# Patient Record
Sex: Male | Born: 1965 | Race: White | Hispanic: Yes | Marital: Married | Smoking: Former smoker
Health system: Southern US, Community
[De-identification: ages and names within clinical notes are randomized; demographics above are authoritative.]

## PROBLEM LIST (undated history)

## (undated) DIAGNOSIS — K219 Gastro-esophageal reflux disease without esophagitis: Secondary | ICD-10-CM

## (undated) DIAGNOSIS — R011 Cardiac murmur, unspecified: Secondary | ICD-10-CM

## (undated) DIAGNOSIS — I10 Essential (primary) hypertension: Secondary | ICD-10-CM

## (undated) DIAGNOSIS — R519 Headache, unspecified: Secondary | ICD-10-CM

## (undated) DIAGNOSIS — I719 Aortic aneurysm of unspecified site, without rupture: Secondary | ICD-10-CM

## (undated) DIAGNOSIS — I739 Peripheral vascular disease, unspecified: Secondary | ICD-10-CM

## (undated) DIAGNOSIS — R42 Dizziness and giddiness: Secondary | ICD-10-CM

## (undated) DIAGNOSIS — M1990 Unspecified osteoarthritis, unspecified site: Secondary | ICD-10-CM

## (undated) DIAGNOSIS — E785 Hyperlipidemia, unspecified: Secondary | ICD-10-CM

## (undated) HISTORY — DX: Dizziness and giddiness: R42

## (undated) HISTORY — PX: APPENDECTOMY: SHX54

## (undated) HISTORY — PX: COLONOSCOPY WITH ESOPHAGOGASTRODUODENOSCOPY (EGD): SHX5779

## (undated) HISTORY — PX: EYE SURGERY: SHX253

---

## 2005-07-11 ENCOUNTER — Encounter: Admission: RE | Admit: 2005-07-11 | Discharge: 2005-07-11 | Payer: Self-pay | Admitting: Specialist

## 2011-09-16 ENCOUNTER — Ambulatory Visit (HOSPITAL_COMMUNITY): Payer: Worker's Compensation

## 2011-09-16 ENCOUNTER — Encounter (HOSPITAL_BASED_OUTPATIENT_CLINIC_OR_DEPARTMENT_OTHER): Payer: Self-pay | Admitting: *Deleted

## 2011-09-16 ENCOUNTER — Encounter (HOSPITAL_BASED_OUTPATIENT_CLINIC_OR_DEPARTMENT_OTHER)
Admission: RE | Disposition: A | Discharge status: Home / Self Care | Payer: Self-pay | Source: Ambulatory Visit | Attending: Orthopedic Surgery

## 2011-09-16 ENCOUNTER — Ambulatory Visit (HOSPITAL_BASED_OUTPATIENT_CLINIC_OR_DEPARTMENT_OTHER)
Admission: RE | Admit: 2011-09-16 | Discharge: 2011-09-16 | Disposition: A | Discharge status: Home / Self Care | Payer: Worker's Compensation | Source: Ambulatory Visit | Attending: Orthopedic Surgery | Admitting: Orthopedic Surgery

## 2011-09-16 DIAGNOSIS — M48061 Spinal stenosis, lumbar region without neurogenic claudication: Secondary | ICD-10-CM

## 2011-09-16 DIAGNOSIS — M5126 Other intervertebral disc displacement, lumbar region: Secondary | ICD-10-CM | POA: Insufficient documentation

## 2011-09-16 HISTORY — PX: STERIOD INJECTION: SHX5046

## 2011-09-16 SURGERY — MINOR STEROID INJECTION
Anesthesia: LOCAL | Site: Back | Laterality: Left | Wound class: Clean

## 2011-09-16 MED ORDER — IOHEXOL 300 MG/ML  SOLN
INTRAMUSCULAR | Status: DC | PRN
  Administered 2011-09-16: 1 mL via EPIDURAL

## 2011-09-16 MED ORDER — BUPIVACAINE HCL (PF) 0.25 % IJ SOLN
INTRAMUSCULAR | Status: DC | PRN
  Administered 2011-09-16: 2 mL

## 2011-09-16 MED ORDER — LIDOCAINE HCL (PF) 1 % IJ SOLN
INTRAMUSCULAR | Status: DC | PRN
  Administered 2011-09-16: 2 mL

## 2011-09-16 MED ORDER — TRAMADOL HCL 50 MG PO TABS: 50.0000 mg | ORAL_TABLET | Freq: Four times a day (QID) | ORAL | Status: DC | PRN

## 2011-09-16 MED ORDER — METHYLPREDNISOLONE ACETATE 40 MG/ML IJ SUSP
INTRAMUSCULAR | Status: DC | PRN
  Administered 2011-09-16: 40 mg

## 2011-09-16 SURGICAL SUPPLY — 15 items
BANDAGE ADHESIVE 1X3 (GAUZE/BANDAGES/DRESSINGS) ×2 IMPLANT
CHLORAPREP W/TINT 26ML (MISCELLANEOUS) ×2 IMPLANT
GLOVE BIO SURGEON STRL SZ 6.5 (GLOVE) ×2 IMPLANT
GLOVE SS BIOGEL STRL SZ 8.5 (GLOVE) ×1 IMPLANT
GLOVE SUPERSENSE BIOGEL SZ 8.5 (GLOVE) ×1
NDL SAFETY ECLIPSE 18X1.5 (NEEDLE) IMPLANT
NDL SPNL 22GX7 QUINCKE BK (NEEDLE) IMPLANT
NEEDLE HYPO 18GX1.5 SHARP (NEEDLE)
NEEDLE SPNL 22GX3.5 QUINCKE BK (NEEDLE) ×2 IMPLANT
NEEDLE SPNL 22GX5 LNG QUINC BK (NEEDLE) IMPLANT
NEEDLE SPNL 22GX7 QUINCKE BK (NEEDLE) IMPLANT
SYR 3ML 23GX1 SAFETY (SYRINGE) ×2 IMPLANT
SYR 5ML LL (SYRINGE) ×2 IMPLANT
SYR CONTROL 10ML LL (SYRINGE) IMPLANT
TOWEL OR 17X24 6PK STRL BLUE (TOWEL DISPOSABLE) ×2 IMPLANT

## 2011-09-16 NOTE — H&P (Signed)
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  A recent MRI shows the disc herniation to have resorbed, but he has foraminal stenosis.  We have recommended a TFESI at left L5-S1 which is being done at [facility]

## 2011-09-16 NOTE — Op Note (Signed)
Preprocedure diagnosis: Left sciatic pain secondary to L5-S1 foraminal stenosis left side, status post resorption of L5-S1 disc protrusion  Post procedure diagnosis: The same  Procedure: Left L5-S1 transforaminal epidural steroid injection, fluoroscopic guidance  The patient was positioned prone on a radiolucent table. The lumbar area it was prepped with chlorhexidine/isopropyl alcohol.  Under biplane for scopic guidance the tip 22-gauge spinal needle was positioned at the exit zone of the left L5-S1 neural foramen. Omnipaque was injected and flowed epidurally and along the nerve. 2 cc of a previously mixed injectate consisting of quarter percent plain preservative-free Marcaine, 1% plain preservative-free lidocaine and Depo-Medrol was injected. The needle was withdrawn. There was no back bleeding. The patient was transferred the preop area.  Sign Johnathan Anderson

## 2011-09-19 ENCOUNTER — Encounter (HOSPITAL_BASED_OUTPATIENT_CLINIC_OR_DEPARTMENT_OTHER): Payer: Self-pay | Admitting: Orthopedic Surgery

## 2013-11-01 ENCOUNTER — Other Ambulatory Visit: Payer: Self-pay | Admitting: Internal Medicine

## 2013-11-01 DIAGNOSIS — R413 Other amnesia: Secondary | ICD-10-CM

## 2013-11-08 ENCOUNTER — Other Ambulatory Visit: Payer: Self-pay

## 2014-01-21 ENCOUNTER — Ambulatory Visit
Admission: RE | Admit: 2014-01-21 | Discharge: 2014-01-21 | Disposition: A | Discharge status: Home / Self Care | Payer: BC Managed Care – PPO | Source: Ambulatory Visit | Attending: Internal Medicine | Admitting: Internal Medicine

## 2014-01-21 DIAGNOSIS — R413 Other amnesia: Secondary | ICD-10-CM

## 2014-01-21 MED ORDER — GADOBENATE DIMEGLUMINE 529 MG/ML IV SOLN
17.0000 mL | Freq: Once | INTRAVENOUS | Status: AC | PRN
  Administered 2014-01-21: 17 mL via INTRAVENOUS

## 2014-02-24 ENCOUNTER — Encounter: Payer: Self-pay | Admitting: Neurology

## 2014-02-24 ENCOUNTER — Ambulatory Visit (INDEPENDENT_AMBULATORY_CARE_PROVIDER_SITE_OTHER): Payer: BC Managed Care – PPO | Admitting: Neurology

## 2014-02-24 VITALS — BP 124/82 | HR 51 | Resp 16 | Ht 67.0 in | Wt 190.0 lb

## 2014-02-24 DIAGNOSIS — R0609 Other forms of dyspnea: Secondary | ICD-10-CM

## 2014-02-24 DIAGNOSIS — R0989 Other specified symptoms and signs involving the circulatory and respiratory systems: Secondary | ICD-10-CM

## 2014-02-24 DIAGNOSIS — R0683 Snoring: Secondary | ICD-10-CM

## 2014-02-24 DIAGNOSIS — G8929 Other chronic pain: Secondary | ICD-10-CM | POA: Insufficient documentation

## 2014-02-24 DIAGNOSIS — M549 Dorsalgia, unspecified: Secondary | ICD-10-CM

## 2014-02-24 DIAGNOSIS — R413 Other amnesia: Secondary | ICD-10-CM | POA: Insufficient documentation

## 2014-02-24 NOTE — Progress Notes (Signed)
NEUROLOGY CONSULTATION NOTE  Johnathan Anderson MRN: [id number] DOB: 1965-09-18  Referring provider: Dr. Lavone Orn Primary care provider: Dr. Lavone Orn  Reason for consult:  Memory loss  Dear Dr Laurann Montana:  Thank you for your kind referral of [facility] for consultation of the above symptoms. Although his history is well known to you, please allow me to reiterate it for the purpose of our medical record. The patient was accompanied to the clinic by his wife who also provides collateral information. Records and images were personally reviewed where available.  HISTORY OF PRESENT ILLNESS: This is a very pleasant 48 year old right-handed man a history of chronic back pain, presenting for worsening memory loss over the past 3 years. The patient himself has noticed that he has become very forgetful even when his wife has listed things down. He has been unemployed and on disability due to the back pain. He forgets to do chores, misplaces things at home.  His wife reports he puts things for the fridge in the pantry where they don't belong. He would forget his wallet and walk back to the house, then forget what he was going to get.  His wife reports that she would tell him where they are going when getting into the car, then he would drive somewhere else, forgetting where they were supposed to go. A few months ago he went for a doctor's appointment, but went to the wrong physician and demanded to be seen, thinking he was supposed to be there in the first place.  His wife needs to constantly remind him about things. He and his family have noticed word-finding difficulties, they have been finishing his sentences for him.  He does endorse struggling with finances due to his disability.  His wife also recently switched jobs a couple of weeks ago. He has been unemployed for the past 6 years, taking Tramadol 1-2 times a day for the back pain.  He briefly took Lexapro but he feels he is not depressed,  stating it is the situation he is in, and that the medication made him too sleepy.  There is no family history of memory problems. No history of head injuries, infections. He used to drink alcohol socially in the past but currently denies any alcohol intake. He reports around 6 hours of interrupted sleep, a lot in part due to his back pain, however his wife reports snoring with occasional possible apnea.  He feels drowsy in the afternoon but does not take naps. He denies any headaches, diplopia, dysarthria, dysphagia, neck pain, bowel/bladder dysfunction. He has occasional positional vertigo lasting 5 minutes with no associated nausea, vomiting. He denies any focal numbness/tingling. He has chronic back pain radiating behind his left leg. He and his wife deny any staring/unresponsive episodes, gaps in time, epigastric rising sensation, olfactory/gustatory hallucinations, myoclonic jerks.  I personally reviewed MRI brain with and without contrast done 01/21/2014 which was normal.  Laboratory Data: CBC and CMP normal. Normal vitamin B12 378, TSH 3.08, nonreactive RPR.  PAST MEDICAL HISTORY: No past medical history on file.  PAST SURGICAL HISTORY: Past Surgical History  Procedure Laterality Date  . Steriod injection  09/16/2011    Procedure: MINOR STEROID INJECTION;  Surgeon: Lowella Grip, MD;  Location: [place];  Service: Orthopedics;  Laterality: Left;  Left Lumbar Five-Sacral One Transforaminal Epidural Steroid Injection  . Eye surgery    . Appendectomy      MEDICATIONS: No current outpatient prescriptions on file prior to  visit.   No current facility-administered medications on file prior to visit.    ALLERGIES: No Known Allergies  FAMILY HISTORY: Family History  Problem Relation Age of Onset  . Diabetes Father   . Hypertension Mother     SOCIAL HISTORY: History   Social History  . Marital Status: Married    Spouse Name: N/A    Number of Children:  N/A  . Years of Education: N/A   Occupational History  . Not on file.   Social History Main Topics  . Smoking status: Former Research scientist (life sciences)  . Smokeless tobacco: Not on file  . Alcohol Use: No  . Drug Use: No  . Sexual Activity: Not on file   Other Topics Concern  . Not on file   Social History Narrative  . No narrative on file    REVIEW OF SYSTEMS: Constitutional: No fevers, chills, or sweats, no generalized fatigue, change in appetite Eyes: No visual changes, double vision, eye pain Ear, nose and throat: No hearing loss, ear pain, nasal congestion, sore throat Cardiovascular: No chest pain, palpitations Respiratory:  No shortness of breath at rest or with exertion, wheezes GastrointestinaI: No nausea, vomiting, diarrhea, abdominal pain, fecal incontinence Genitourinary:  No dysuria, urinary retention or frequency Musculoskeletal:  No neck pain, +back pain Integumentary: No rash, pruritus, skin lesions Neurological: as above Psychiatric: No depression, insomnia, anxiety Endocrine: No palpitations, fatigue, diaphoresis, mood swings, change in appetite, change in weight, increased thirst Hematologic/Lymphatic:  No anemia, purpura, petechiae. Allergic/Immunologic: no itchy/runny eyes, nasal congestion, recent allergic reactions, rashes  PHYSICAL EXAM: Filed Vitals:   02/24/14 0911  BP: 124/82  Pulse: 51  Resp: 16   General: No acute distress Head:  Normocephalic/atraumatic Eyes: Fundoscopic exam shows bilateral sharp discs, no vessel changes, exudates, or hemorrhages Neck: supple, no paraspinal tenderness, full range of motion Back: No paraspinal tenderness Heart: regular rate and rhythm Lungs: Clear to auscultation bilaterally. Vascular: No carotid bruits. Skin/Extremities: No rash, no edema Neurological Exam: Mental status: alert and oriented to person, place, and time, no dysarthria or aphasia, Fund of knowledge is appropriate.  Remote memory are intact.  Decreased  attention and concentration.    Able to name objects and repeat phrases.  Montreal Cognitive Assessment  02/24/2014  Visuospatial/ Executive (0/5) 5  Naming (0/3) 3  Attention: Read list of digits (0/2) 2  Attention: Read list of letters (0/1) 0  Attention: Serial 7 subtraction starting at 100 (0/3) 3  Language: Repeat phrase (0/2) 2  Language : Fluency (0/1) 0  Abstraction (0/2) 2  Delayed Recall (0/5) 2  Orientation (0/6) 6  Total 25  Adjusted Score (based on education) 26   Cranial nerves: CN I: not tested CN II: pupils equal, round and reactive to light, visual fields intact, fundi unremarkable. CN III, IV, VI:  full range of motion, no nystagmus, no ptosis CN V: facial sensation intact CN VII: upper and lower face symmetric CN VIII: hearing intact to finger rub CN IX, X: gag intact, uvula midline CN XI: sternocleidomastoid and trapezius muscles intact CN XII: tongue midline Bulk & Tone: normal, no fasciculations. Motor: 5/5 throughout with no pronator drift. Sensation: intact to light touch, cold, pin, vibration and joint position sense.  No extinction to double simultaneous stimulation.  Romberg test negative Deep Tendon Reflexes: +2 throughout, no ankle clonus Plantar responses: downgoing bilaterally Cerebellar: no incoordination on finger to nose, heel to shin. No dysdiadochokinesia Gait: narrow-based and steady, able to tandem walk adequately. Tremor: none  IMPRESSION: This is a pleasant 48 year old right-handed man with a history of chronic back pain presenting for worsening memory loss over the past 3 years. Neurological exam and brain MRI normal.  His MOCA score today is 26/30. Although this is still considered normal, by history symptoms are suggestive of mild cognitive impairment. There is some decreased attention and concentration noted during the [place], we discussed different causes of memory loss. Metabolic workup normal. Sleep apnea may contribute to cognitive  issues as well, he will be scheduled for a sleep study to assess for this.  We discussed pseudodementia and effects of mood on memory, he would benefit from formal neuropsychological evaluation to further evaluate the cause of his cognitive changes.  No indication for starting cholinesterase inhibitors at this time. We discussed the importance of physical exercise and brain stimulation exercises for brain health.  He will follow-up after the tests.    Thank you for allowing me to participate in the care of this patient. Please do not hesitate to call for any questions or concerns.   Ellouise Newer, M.D.  CC: Dr. Laurann Montana

## 2014-02-24 NOTE — Patient Instructions (Signed)
1. Schedule Neuropsychological evaluation 2. Schedule sleep study 3. Physical exercise and brain stimulation exercises are important for brain health 4. Follow-up after the tests

## 2014-04-27 ENCOUNTER — Encounter (HOSPITAL_BASED_OUTPATIENT_CLINIC_OR_DEPARTMENT_OTHER): Payer: BC Managed Care – PPO

## 2014-05-09 ENCOUNTER — Ambulatory Visit: Payer: BC Managed Care – PPO | Admitting: Neurology

## 2014-05-12 ENCOUNTER — Telehealth: Payer: Self-pay | Admitting: Neurology

## 2014-05-12 ENCOUNTER — Encounter: Payer: Self-pay | Admitting: *Deleted

## 2014-05-12 NOTE — Progress Notes (Unsigned)
No show letter sent for 05/09/2014

## 2014-05-12 NOTE — Telephone Encounter (Signed)
Pt no showed 05/12/14 appt w/ Dr. Delice Lesch. Several attempts were made to contact pt b/c we had to r/s appt. Pt never called our office back.  Danae Chen - please send no show letter / Gayleen Orem.

## 2019-03-16 ENCOUNTER — Other Ambulatory Visit: Payer: Self-pay

## 2019-03-16 ENCOUNTER — Ambulatory Visit (HOSPITAL_COMMUNITY)
Admission: EM | Admit: 2019-03-16 | Discharge: 2019-03-16 | Disposition: A | Discharge status: Home / Self Care | Payer: BC Managed Care – PPO | Attending: Family Medicine | Admitting: Family Medicine

## 2019-03-16 ENCOUNTER — Encounter (HOSPITAL_COMMUNITY): Payer: Self-pay

## 2019-03-16 DIAGNOSIS — T7840XA Allergy, unspecified, initial encounter: Secondary | ICD-10-CM

## 2019-03-16 MED ORDER — METHYLPREDNISOLONE SODIUM SUCC 125 MG IJ SOLR
80.0000 mg | Freq: Once | INTRAMUSCULAR | Status: AC
  Administered 2019-03-16: 13:00:00 80 mg via INTRAMUSCULAR

## 2019-03-16 MED ORDER — METHYLPREDNISOLONE SODIUM SUCC 125 MG IJ SOLR
INTRAMUSCULAR | Status: AC
  Filled 2019-03-16: qty 2

## 2019-03-16 NOTE — ED Triage Notes (Signed)
Pt report he think he was bit by a spider 2 hrs ago in her left arm, after that he feel huis ears are clogged and hot, and pressure in his head.

## 2019-03-16 NOTE — Discharge Instructions (Signed)
I believe this is an allergic reaction to possible insect bite or some substance on the wood.  We are giving you a steroid injection here today and would like for you to take benadryl every 6  to 8 hours If this worsens please let us know.  If you start having a trouble swallowing or breathing you will need to go straight to the ER.

## 2019-03-17 NOTE — ED Provider Notes (Signed)
[place]    CSN: [id number] Arrival date & time: 03/16/19  1228      History   Chief Complaint Chief Complaint  Patient presents with  . Insect Bite  . Ear Problem    HPI Johnathan Anderson is a 53 y.o. male.   Patient is a 53 year old male that presents today with allergic reaction possible insect bite to left anterior forearm.  He also has mild rash to [facility] area.  This started today after he was picking up some old wet plywood from outside.  Unsure if he was stung or bit by something.  Red raised area to forearm which is itchy.  Also feeling flushed in face and ears.  Symptoms have been constant.  He has not taken thing for his symptoms.  No trouble swallowing, breathing or shortness of breath. No allergies anything that he knows of.  ROS per HPI      History reviewed. No pertinent past medical history.  Patient Active Problem List   Diagnosis Date Noted  . Memory loss 02/24/2014  . Chronic back pain 02/24/2014    Past Surgical History:  Procedure Laterality Date  . APPENDECTOMY    . EYE SURGERY    . STERIOD INJECTION  09/16/2011   Procedure: MINOR STEROID INJECTION;  Surgeon: Lowella Grip, MD;  Location: [place];  Service: Orthopedics;  Laterality: Left;  Left Lumbar Five-Sacral One Transforaminal Epidural Steroid Injection       Home Medications    Prior to Admission medications   Medication Sig Start Date End Date Taking? Authorizing Provider  traMADol (ULTRAM) 50 MG tablet Take by mouth. Every 8 hours prn    [provider]    Family History Family History  Problem Relation Age of Onset  . Diabetes Father   . Hypertension Mother     Social History Social History   Tobacco Use  . Smoking status: Former Research scientist (life sciences)  . Smokeless tobacco: Never Used  Substance Use Topics  . Alcohol use: No  . Drug use: No     Allergies   Patient has no known allergies.   Review of Systems Review of Systems    Physical Exam Triage Vital Signs ED Triage Vitals  Enc Vitals Group     BP 03/16/19 1244 130/64     Pulse Rate 03/16/19 1244 60     Resp 03/16/19 1244 16     Temp 03/16/19 1244 98.8 F (37.1 C)     Temp Source 03/16/19 1244 Oral     SpO2 03/16/19 1244 96 %     Weight --      Height --      Head Circumference --      Peak Flow --      Pain Score 03/16/19 1242 0     Pain Loc --      Pain Edu? --      Excl. in [place]? --    No data found.  Updated Vital Signs BP 130/64 (BP Location: Right Arm)   Pulse 60   Temp 98.8 F (37.1 C) (Oral)   Resp 16   SpO2 96%   Visual Acuity Right Eye Distance:   Left Eye Distance:   Bilateral Distance:    Right Eye Near:   Left Eye Near:    Bilateral Near:     Physical Exam Vitals signs and nursing note reviewed.  Constitutional:      General: He is not in acute distress.  Appearance: Normal appearance. He is not ill-appearing, toxic-appearing or diaphoretic.  HENT:     Head: Normocephalic and atraumatic.     Comments: Somewhat flushed to face    Right Ear: Tympanic membrane and ear canal normal.     Left Ear: Tympanic membrane and ear canal normal.     Ears:     Comments: Both auricles erythematous but nonpainful to touch.    Nose: Nose normal.     Mouth/Throat:     Pharynx: Oropharynx is clear.  Eyes:     Conjunctiva/sclera: Conjunctivae normal.  Neck:     Musculoskeletal: Normal range of motion.  Cardiovascular:     Rate and Rhythm: Normal rate and regular rhythm.     Pulses: Normal pulses.     Heart sounds: Normal heart sounds.  Pulmonary:     Effort: Pulmonary effort is normal.     Breath sounds: Normal breath sounds.  Musculoskeletal: Normal range of motion.  Skin:    General: Skin is warm and dry.     Findings: Erythema and rash present. Rash is urticarial.          Comments: Urticarial rash to left before meals and red, raised area to left posterior forearm   Neurological:     Mental Status: He is alert.   Psychiatric:        Mood and Affect: Mood normal.      UC Treatments / Results  Labs (all labs ordered are listed, but only abnormal results are displayed) Labs Reviewed - No data to display  EKG   Radiology No results found.  Procedures Procedures (including critical care time)  Medications Ordered in UC Medications  methylPREDNISolone sodium succinate (SOLU-MEDROL) 125 mg/2 mL injection 80 mg (80 mg Intramuscular Given 03/16/19 1319)  methylPREDNISolone sodium succinate (SOLU-MEDROL) 125 mg/2 mL injection (has no administration in time range)    Initial Impression / Assessment and Plan / UC Course  I have reviewed the triage vital signs and the nursing notes.  Pertinent labs & imaging results that were available during my care of the patient were reviewed by me and considered in my medical decision making (see chart for details).     Allergic reaction-most likely allergic reaction to something on the wood or some sort of insect bite. No red flags. Treating with steroid injection here in clinic and will have him do Benadryl every 6-8 hours Strict return precautions given Follow up as needed for continued or worsening symptoms  Final Clinical Impressions(s) / UC Diagnoses   Final diagnoses:  Allergic reaction, initial encounter     Discharge Instructions     I believe this is an allergic reaction to possible insect bite or some substance on the wood.  We are giving you a steroid injection here today and would like for you to take benadryl every 6  to 8 hours If this worsens please let us know.  If you start having a trouble swallowing or breathing you will need to go straight to the ER.    ED Prescriptions    None     PDMP not reviewed this encounter.   Orvan July, NP 03/17/19 1035

## 2020-06-06 DIAGNOSIS — C801 Malignant (primary) neoplasm, unspecified: Secondary | ICD-10-CM

## 2020-06-06 HISTORY — PX: MOUTH SURGERY: SHX715

## 2020-06-06 HISTORY — DX: Malignant (primary) neoplasm, unspecified: C80.1

## 2020-07-14 ENCOUNTER — Ambulatory Visit
Admission: RE | Admit: 2020-07-14 | Discharge: 2020-07-14 | Disposition: A | Discharge status: Home / Self Care | Payer: 59 | Source: Ambulatory Visit | Attending: Internal Medicine | Admitting: Internal Medicine

## 2020-07-14 ENCOUNTER — Other Ambulatory Visit: Payer: Self-pay | Admitting: Internal Medicine

## 2020-07-14 DIAGNOSIS — M25562 Pain in left knee: Secondary | ICD-10-CM

## 2020-07-22 ENCOUNTER — Other Ambulatory Visit (HOSPITAL_COMMUNITY): Payer: Self-pay | Admitting: Internal Medicine

## 2020-07-22 DIAGNOSIS — Q231 Congenital insufficiency of aortic valve: Secondary | ICD-10-CM

## 2020-08-14 ENCOUNTER — Other Ambulatory Visit: Payer: Self-pay

## 2020-08-14 ENCOUNTER — Ambulatory Visit (HOSPITAL_BASED_OUTPATIENT_CLINIC_OR_DEPARTMENT_OTHER)
Admission: RE | Admit: 2020-08-14 | Discharge: 2020-08-14 | Disposition: A | Discharge status: Home / Self Care | Payer: 59 | Source: Ambulatory Visit | Attending: Internal Medicine | Admitting: Internal Medicine

## 2020-08-14 DIAGNOSIS — Q231 Congenital insufficiency of aortic valve: Secondary | ICD-10-CM | POA: Insufficient documentation

## 2020-08-15 LAB — ECHOCARDIOGRAM COMPLETE
AR max vel: 0.72 cm2
AV Area VTI: 0.79 cm2
AV Area mean vel: 0.65 cm2
AV Mean grad: 29.3 mmHg
AV Peak grad: 46.9 mmHg
Ao pk vel: 3.42 m/s
Area-P 1/2: 4.06 cm2
S' Lateral: 2.72 cm

## 2020-08-27 ENCOUNTER — Other Ambulatory Visit: Payer: Self-pay | Admitting: Internal Medicine

## 2020-08-27 ENCOUNTER — Ambulatory Visit
Admission: RE | Admit: 2020-08-27 | Discharge: 2020-08-27 | Disposition: A | Discharge status: Home / Self Care | Payer: 59 | Source: Ambulatory Visit | Attending: Internal Medicine | Admitting: Internal Medicine

## 2020-08-27 DIAGNOSIS — M25511 Pain in right shoulder: Secondary | ICD-10-CM

## 2020-10-04 NOTE — Progress Notes (Signed)
Cardiology Office Note:    Date:  10/05/2020   ID:  Johnathan Anderson, DOB 15-Nov-1965, MRN [id number]  PCP:  Lavone Orn, [place]  Cardiologist:  Werner Lean, MD  Advanced Practice Provider:  No care team member to display Electrophysiologist:  None       CC: Bicuspid Aortic Valve  Consulted for the evaluation of Bicuspid Aortic Valve/Mild to moderate AA dilation, and rule out of vegetation at the behest of Lavone Orn, MD   History of Present Illness:    Johnathan Anderson is a 55 y.o. male with a hx of recent echocardiogram 08/14/20 who presents for evaluation 10/04/20.  Patient notes that he is feeling fine.  Has had no chest pain, chest pressure, chest tightness, chest stinging.   Does rarely feel chest pinch- spontaneously and randomly, that last 0.5 s.  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  No shortness of breath, DOE.  No PND or orthopnea.  No bendopnea, weight gain, leg swelling , or abdominal swelling.  No syncope or near syncope. Notes  no palpitations or funny heart beats.   No fevers, chills, night sweats.  No recent hospitalizations.  Notes three molars that was taking out 6 months ago and has peridontal issues.   No past medical history on file.  Past Surgical History:  Procedure Laterality Date  . APPENDECTOMY    . EYE SURGERY    . STERIOD INJECTION  09/16/2011   Procedure: MINOR STEROID INJECTION;  Surgeon: Lowella Grip, MD;  Location: [place];  Service: Orthopedics;  Laterality: Left;  Left Lumbar Five-Sacral One Transforaminal Epidural Steroid Injection    Current Medications: Current Meds  Medication Sig  . diclofenac Sodium (VOLTAREN) 1 % GEL daily.  . [DISCONTINUED] traMADol (ULTRAM) 50 MG tablet Take by mouth. Every 8 hours prn     Allergies:   Levaquin [levofloxacin]   Social History   Socioeconomic History  . Marital  status: Married    Spouse name: Not on file  . Number of children: Not on file  . Years of education: Not on file  . Highest education level: Not on file  Occupational History  . Not on file  Tobacco Use  . Smoking status: Former Research scientist (life sciences)  . Smokeless tobacco: Never Used  Substance and Sexual Activity  . Alcohol use: No  . Drug use: No  . Sexual activity: Not on file  Other Topics Concern  . Not on file  Social History Narrative  . Not on file   Social Determinants of Health   Financial Resource Strain: Not on file  Food Insecurity: Not on file  Transportation Needs: Not on file  Physical Activity: Not on file  Stress: Not on file  Social Connections: Not on file    Social: Comes with his wife; has many brothers and four sons; originally from [place]  Family History: The patient's family history includes Diabetes in his father; Hypertension in his mother. History of coronary artery disease notable possibly brother. History of heart failure notable for no members. History of arrhythmia notable for no members Family history of sudden cardiac death: Brother was totally fine, had an argument with his daughter, and died suddenly. No history of bicuspid aortic valve or aortic aneurysm. No history of cardiomyopathies including hypertrophic cardiomyopathy, left ventricular non-compaction, or arrhythmogenic right ventricular cardiomyopathy.   ROS:   Please see the history of present illness.  All other systems reviewed and are negative.  EKGs/Labs/Other Studies Reviewed:    The following studies were reviewed today:  EKG:  EKG is  ordered today.  The ekg ordered today demonstrates  10/05/20: Sinus bradycardia borderline LVH 08/19/20 OSH;  SR rate 60 borderline LVH  Transthoracic Echocardiogram: Date: 08/14/20 Results: AA 44 mm, Bicuspid calcific valve, cannot r/o calcification vs lesion on LVOT side, no evidence of Coarctation; LVSVi 33; DVI 0.28, Acceleration time < 100  ms 1. Left ventricular ejection fraction, by estimation, is 60 to 65%. The  left ventricle has normal function. The left ventricle has no regional  wall motion abnormalities. Left ventricular diastolic parameters were  normal.  2. Right ventricular systolic function is normal. The right ventricular  size is normal. There is normal pulmonary artery systolic pressure.  3. The mitral valve is normal in structure. No evidence of mitral valve  regurgitation. No evidence of mitral stenosis.  4. Mass noted attached to rt coronary cusp of the aortic valve measuring  ~7 mm. Differential diagnosis should include endocarditis, fibroelastome,  thrombus. TEE is recommended.. The aortic valve is bicuspid. There is mild  calcification of the aortic  valve. There is mild thickening of the aortic valve. Aortic valve  regurgitation is mild. Moderate aortic valve stenosis.  5. Aneurysm of the ascending aorta, measuring 44 mm.  6. The inferior vena cava is normal in size with greater than 50%  respiratory variability, suggesting right atrial pressure of 3 mmHg.    Recent Labs: No results found for requested labs within last 8760 hours.  Recent Lipid Panel No results found for: CHOL, TRIG, HDL, CHOLHDL, VLDL, LDLCALC, LDLDIRECT   Risk Assessment/Calculations:     N/A  Physical Exam:    VS:  BP 130/80   Pulse (!) 52   Ht 5\' 6"  (1.676 m)   Wt 204 lb (92.5 kg)   SpO2 98%   BMI 32.93 kg/m     Wt Readings from Last 3 Encounters:  10/05/20 204 lb (92.5 kg)  02/24/14 190 lb (86.2 kg)  09/16/11 180 lb (81.6 kg)    GEN:  Well nourished, well developed in no acute distress HEENT: Normal NECK: No JVD; No carotid bruits LYMPHATICS: No lymphadenopathy CARDIAC: RRR, III/VI systolic murmur with A2 still present and with no rubs, gallops RESPIRATORY:  Clear to auscultation without rales, wheezing or rhonchi  ABDOMEN: Soft, non-tender, non-distended MUSCULOSKELETAL:  No edema; No deformity   SKIN: Warm and dry NEUROLOGIC:  Alert and oriented x 3 PSYCHIATRIC:  Normal affect   ASSESSMENT:    1. Thoracic aortic aneurysm without rupture ([place])   2. Moderate aortic stenosis   3. Bicuspid aortic valve   4. Family history of aortic dissection   5. Periodontitis    PLAN:    In order of problems listed above:  Asymptomatic thoracic aortic aneurysm Moderate Aortic Stenosis Bicuspid Aortic Valve without Coarctation of the Aorta Possible Fhx Aortic Dissection (Brother) Peridontitis with dental work and possible aortic valve mass - Last at 44 mm, unclear rate of growth  - will get CT TAVR (morphology) can also get some information on aortic size with this - given peridontal disease will plan for TEE as well - 1st degree relative one time screening:  Given his brother we have a  A lower threshold to refer to surgery ( 4.5 cm with SAVR/5.0 cm without) - Discussed not using Fluoroquinolones - if no surgical indications will get echo (TTE) for monitoring in December  December 2022 follow up unless new symptoms or abnormal test results warranting change in plan  Would be reasonable for  Video Visit Follow up Would be reasonable for  APP Follow up    Shared Decision Making/Informed Consent The risks [esophageal damage, perforation (1:10,000 risk), bleeding, pharyngeal hematoma as well as other potential complications associated with conscious sedation including aspiration, arrhythmia, respiratory failure and death], benefits (treatment guidance and diagnostic support) and alternatives of a transesophageal echocardiogram were discussed in detail with Mr. Kochevar and he is willing to proceed.       Medication Adjustments/Labs and Tests Ordered: Current medicines are reviewed at length with the patient today.  Concerns regarding medicines are outlined above.  Orders Placed This Encounter  Procedures  . CT ANGIO CHEST AORTA W/CM & OR WO/CM  . CT CORONARY MORPH W/CTA COR W/SCORE  W/CA W/CM &/OR WO/CM  . CT Angio Abd/Pel w/ and/or w/o  . EKG 12-Lead  . ECHOCARDIOGRAM COMPLETE   No orders of the defined types were placed in this encounter.   Patient Instructions  Medication Instructions:  Your physician recommends that you continue on your current medications as directed. Please refer to the Current Medication list given to you today. *If you need a refill on your cardiac medications before your next appointment, please call your pharmacy*   Lab Work: NONE If you have labs (blood work) drawn today and your tests are completely normal, you will receive your results only by: Marland Kitchen MyChart Message (if you have MyChart) OR . A paper copy in the mail If you have any lab test that is abnormal or we need to change your treatment, we will call you to review the results.   Testing/Procedures: Your physician has requested that you have a CT scan.  Your physician has requested that you have a TEE. During a TEE, sound waves are used to create images of your heart. It provides your doctor with information about the size and shape of your heart and how well your heart's chambers and valves are working. In this test, a transducer is attached to the end of a flexible tube that's guided down your throat and into your esophagus (the tube leading from you mouth to your stomach) to get a more detailed image of your heart. You are not awake for the procedure. Please see the instruction sheet given to you today.   Your physician has requested that you have an echocardiogram in December. Echocardiography is a painless test that uses sound waves to create images of your heart. It provides your doctor with information about the size and shape of your heart and how well your heart's chambers and valves are working. This procedure takes approximately one hour. There are no restrictions for this procedure.     Follow-Up: At [facility], you and your health needs are our priority.  As part  of our continuing mission to provide you with exceptional heart care, we have created designated Provider Care Teams.  These Care Teams include your primary Cardiologist (physician) and Advanced Practice Providers (APPs -  Physician Assistants and Nurse Practitioners) who all work together to provide you with the care you need, when you need it.  We recommend signing up for the patient portal called "MyChart".  Sign up information is provided on this After Visit Summary.  MyChart is used to connect with patients for Virtual Visits (Telemedicine).  Patients are able to view lab/test results, encounter notes, upcoming appointments, etc.  Non-urgent messages can  be sent to your provider as well.   To learn more about what you can do with MyChart, go to [email or web address].    Your next appointment:   7 month(s)  The format for your next appointment:   In Person  Provider:   You may see Werner Lean, MD or one of the following Advanced Practice Providers on your designated Care Team:    Melina Copa, PA-C  Ermalinda Barrios, PA-C    Other Instructions   You are scheduled for a TEE on November 05, 2020 with Dr. Gasper Sells.  Please arrive at the [facility] (Main Entrance A) at [facility]: [address] [place], [place] [address] at 6:30 am.   DIET: Nothing to eat or drink after midnight except a sip of water with medications  Medication Instructions:   Labs: will be drawn day of TEE on November 05, 2020  You must have a responsible person to drive you home and stay in the waiting area during your procedure. Failure to do so could result in cancellation.  Bring your insurance cards.  *Special Note: Every effort is made to have your procedure done on time. Occasionally there are emergencies that occur at the hospital that may cause delays. Please be patient if a delay does occur.      INSTRUCTIONS FOR  CT SCANS  Your cardiac CT will be scheduled at one of the below  locations:   [facility] [address] [place], [place] [address] [phone]    Please arrive at the [facility] main entrance (entrance A) of [facility] 30 minutes prior to test start time. Proceed to the [facility] Radiology Department (first floor) to check-in and test prep.   Please follow these instructions carefully (unless otherwise directed):  Hold all erectile dysfunction medications at least 3 days (72 hrs) prior to test.  On the Night Before the Test: . Be sure to Drink plenty of water. . Do not consume any caffeinated/decaffeinated beverages or chocolate 12 hours prior to your test. . Do not take any antihistamines 12 hours prior to your test.  On the Day of the Test: . Drink plenty of water until 1 hour prior to the test. . Do not eat any food 4 hours prior to the test. . You may take your regular medications prior to the test.          After the Test: . Drink plenty of water. . After receiving IV contrast, you may experience a mild flushed feeling. This is normal. . On occasion, you may experience a mild rash up to 24 hours after the test. This is not dangerous. If this occurs, you can take Benadryl 25 mg and increase your fluid intake. . If you experience trouble breathing, this can be serious. If it is severe call 911 IMMEDIATELY. If it is mild, please call our office. . If you take any of these medications: Glipizide/Metformin, Avandament, Glucavance, please do not take 48 hours after completing test unless otherwise instructed.   Once we have confirmed authorization from your insurance company, we will call you to set up a date and time for your test. Based on how quickly your insurance processes prior authorizations requests, please allow up to 4 weeks to be contacted for scheduling your Cardiac CT appointment. Be advised that routine Cardiac CT appointments could be scheduled as many as 8 weeks after your provider has ordered  it.  For non-scheduling related questions, please contact  the cardiac imaging nurse navigator should you have any questions/concerns: Marchia Bond, Cardiac Imaging Nurse Navigator Gordy Clement, Cardiac Imaging Nurse Navigator [facility] Direct Office Dial: [phone]   For scheduling needs, including cancellations and rescheduling, please call [place], [phone].       Signed, Werner Lean, MD  10/05/2020 11:02 AM    [place]

## 2020-10-05 ENCOUNTER — Ambulatory Visit: Payer: 59 | Admitting: Internal Medicine

## 2020-10-05 ENCOUNTER — Encounter: Payer: Self-pay | Admitting: Internal Medicine

## 2020-10-05 ENCOUNTER — Other Ambulatory Visit: Payer: Self-pay

## 2020-10-05 VITALS — BP 130/80 | HR 52 | Ht 66.0 in | Wt 204.0 lb

## 2020-10-05 DIAGNOSIS — I712 Thoracic aortic aneurysm, without rupture, unspecified: Secondary | ICD-10-CM | POA: Insufficient documentation

## 2020-10-05 DIAGNOSIS — I350 Nonrheumatic aortic (valve) stenosis: Secondary | ICD-10-CM | POA: Insufficient documentation

## 2020-10-05 DIAGNOSIS — Q2381 Bicuspid aortic valve: Secondary | ICD-10-CM | POA: Insufficient documentation

## 2020-10-05 DIAGNOSIS — Q231 Congenital insufficiency of aortic valve: Secondary | ICD-10-CM | POA: Diagnosis not present

## 2020-10-05 DIAGNOSIS — Z8249 Family history of ischemic heart disease and other diseases of the circulatory system: Secondary | ICD-10-CM

## 2020-10-05 DIAGNOSIS — K0530 Chronic periodontitis, unspecified: Secondary | ICD-10-CM

## 2020-10-05 NOTE — Patient Instructions (Addendum)
Medication Instructions:  Your physician recommends that you continue on your current medications as directed. Please refer to the Current Medication list given to you today. *If you need a refill on your cardiac medications before your next appointment, please call your pharmacy*   Lab Work: NONE If you have labs (blood work) drawn today and your tests are completely normal, you will receive your results only by: Marland Kitchen MyChart Message (if you have MyChart) OR . A paper copy in the mail If you have any lab test that is abnormal or we need to change your treatment, we will call you to review the results.   Testing/Procedures: Your physician has requested that you have a CT scan.  Your physician has requested that you have a TEE. During a TEE, sound waves are used to create images of your heart. It provides your doctor with information about the size and shape of your heart and how well your heart's chambers and valves are working. In this test, a transducer is attached to the end of a flexible tube that's guided down your throat and into your esophagus (the tube leading from you mouth to your stomach) to get a more detailed image of your heart. You are not awake for the procedure. Please see the instruction sheet given to you today.   Your physician has requested that you have an echocardiogram in December. Echocardiography is a painless test that uses sound waves to create images of your heart. It provides your doctor with information about the size and shape of your heart and how well your heart's chambers and valves are working. This procedure takes approximately one hour. There are no restrictions for this procedure.     Follow-Up: At [facility], you and your health needs are our priority.  As part of our continuing mission to provide you with exceptional heart care, we have created designated Provider Care Teams.  These Care Teams include your primary Cardiologist (physician) and Advanced  Practice Providers (APPs -  Physician Assistants and Nurse Practitioners) who all work together to provide you with the care you need, when you need it.  We recommend signing up for the patient portal called "MyChart".  Sign up information is provided on this After Visit Summary.  MyChart is used to connect with patients for Virtual Visits (Telemedicine).  Patients are able to view lab/test results, encounter notes, upcoming appointments, etc.  Non-urgent messages can be sent to your provider as well.   To learn more about what you can do with MyChart, go to [email or web address].    Your next appointment:   7 month(s)  The format for your next appointment:   In Person  Provider:   You may see Werner Lean, MD or one of the following Advanced Practice Providers on your designated Care Team:    Melina Copa, PA-C  Ermalinda Barrios, PA-C    Other Instructions   You are scheduled for a TEE on November 05, 2020 with Dr. Gasper Sells.  Please arrive at the [facility] (Main Entrance A) at [facility]: [address] [place], [place] [address] at 6:30 am.   DIET: Nothing to eat or drink after midnight except a sip of water with medications  Medication Instructions:   Labs: will be drawn day of TEE on November 05, 2020  You must have a responsible person to drive you home and stay in the waiting area during your procedure. Failure to do so could result in cancellation.  Bring  your insurance cards.  *Special Note: Every effort is made to have your procedure done on time. Occasionally there are emergencies that occur at the hospital that may cause delays. Please be patient if a delay does occur.      INSTRUCTIONS FOR  CT SCANS  Your cardiac CT will be scheduled at one of the below locations:   [facility] [address] [place], [place] [address] [phone]    Please arrive at the [facility] main entrance (entrance A) of [facility] 30 minutes  prior to test start time. Proceed to the [facility] Radiology Department (first floor) to check-in and test prep.   Please follow these instructions carefully (unless otherwise directed):  Hold all erectile dysfunction medications at least 3 days (72 hrs) prior to test.  On the Night Before the Test: . Be sure to Drink plenty of water. . Do not consume any caffeinated/decaffeinated beverages or chocolate 12 hours prior to your test. . Do not take any antihistamines 12 hours prior to your test.  On the Day of the Test: . Drink plenty of water until 1 hour prior to the test. . Do not eat any food 4 hours prior to the test. . You may take your regular medications prior to the test.          After the Test: . Drink plenty of water. . After receiving IV contrast, you may experience a mild flushed feeling. This is normal. . On occasion, you may experience a mild rash up to 24 hours after the test. This is not dangerous. If this occurs, you can take Benadryl 25 mg and increase your fluid intake. . If you experience trouble breathing, this can be serious. If it is severe call 911 IMMEDIATELY. If it is mild, please call our office. . If you take any of these medications: Glipizide/Metformin, Avandament, Glucavance, please do not take 48 hours after completing test unless otherwise instructed.   Once we have confirmed authorization from your insurance company, we will call you to set up a date and time for your test. Based on how quickly your insurance processes prior authorizations requests, please allow up to 4 weeks to be contacted for scheduling your Cardiac CT appointment. Be advised that routine Cardiac CT appointments could be scheduled as many as 8 weeks after your provider has ordered it.  For non-scheduling related questions, please contact the cardiac imaging nurse navigator should you have any questions/concerns: Marchia Bond, Cardiac Imaging Nurse Navigator Gordy Clement, Cardiac  Imaging Nurse Navigator [facility] Direct Office Dial: [phone]   For scheduling needs, including cancellations and rescheduling, please call [place], [phone].

## 2020-10-29 ENCOUNTER — Other Ambulatory Visit (HOSPITAL_COMMUNITY): Payer: 59

## 2020-10-29 ENCOUNTER — Other Ambulatory Visit: Payer: 59

## 2020-11-03 ENCOUNTER — Telehealth: Payer: Self-pay | Admitting: Internal Medicine

## 2020-11-03 ENCOUNTER — Other Ambulatory Visit (HOSPITAL_COMMUNITY): Payer: 59

## 2020-11-03 NOTE — Telephone Encounter (Signed)
Called patient left a message informing him that due to new guidelines he does not have to have a Covid test.  Told him to call the office with any questions.

## 2020-11-03 NOTE — Telephone Encounter (Signed)
Patient calling to reschedule covid test for his procedure.

## 2020-11-05 ENCOUNTER — Ambulatory Visit (HOSPITAL_COMMUNITY): Payer: 59 | Admitting: Anesthesiology

## 2020-11-05 ENCOUNTER — Encounter (HOSPITAL_COMMUNITY)
Admission: RE | Disposition: A | Discharge status: Home / Self Care | Payer: Self-pay | Source: Home / Self Care | Attending: Internal Medicine

## 2020-11-05 ENCOUNTER — Ambulatory Visit (HOSPITAL_BASED_OUTPATIENT_CLINIC_OR_DEPARTMENT_OTHER)
Admission: RE | Admit: 2020-11-05 | Discharge: 2020-11-05 | Disposition: A | Discharge status: Home / Self Care | Payer: 59 | Source: Home / Self Care | Attending: Internal Medicine | Admitting: Internal Medicine

## 2020-11-05 ENCOUNTER — Ambulatory Visit (HOSPITAL_COMMUNITY)
Admission: RE | Admit: 2020-11-05 | Discharge: 2020-11-05 | Disposition: A | Discharge status: Home / Self Care | Payer: 59 | Attending: Internal Medicine | Admitting: Internal Medicine

## 2020-11-05 ENCOUNTER — Encounter (HOSPITAL_COMMUNITY): Payer: Self-pay | Admitting: Internal Medicine

## 2020-11-05 DIAGNOSIS — I350 Nonrheumatic aortic (valve) stenosis: Secondary | ICD-10-CM | POA: Insufficient documentation

## 2020-11-05 DIAGNOSIS — Z87891 Personal history of nicotine dependence: Secondary | ICD-10-CM | POA: Diagnosis not present

## 2020-11-05 DIAGNOSIS — Z8249 Family history of ischemic heart disease and other diseases of the circulatory system: Secondary | ICD-10-CM

## 2020-11-05 DIAGNOSIS — Z881 Allergy status to other antibiotic agents status: Secondary | ICD-10-CM | POA: Insufficient documentation

## 2020-11-05 DIAGNOSIS — K0530 Chronic periodontitis, unspecified: Secondary | ICD-10-CM

## 2020-11-05 DIAGNOSIS — I712 Thoracic aortic aneurysm, without rupture, unspecified: Secondary | ICD-10-CM

## 2020-11-05 DIAGNOSIS — Q231 Congenital insufficiency of aortic valve: Secondary | ICD-10-CM

## 2020-11-05 HISTORY — PX: TEE WITHOUT CARDIOVERSION: SHX5443

## 2020-11-05 LAB — ECHO TEE
Calc EF: 60.3 %
Single Plane A2C EF: 50.3 %
Single Plane A4C EF: 66.1 %

## 2020-11-05 SURGERY — ECHOCARDIOGRAM, TRANSESOPHAGEAL
Anesthesia: Monitor Anesthesia Care

## 2020-11-05 MED ORDER — PROPOFOL 500 MG/50ML IV EMUL
INTRAVENOUS | Status: DC | PRN
  Administered 2020-11-05 (×2): 200 ug/kg/min via INTRAVENOUS

## 2020-11-05 MED ORDER — SODIUM CHLORIDE 0.9 % IV SOLN
INTRAVENOUS | Status: DC
Start: 2020-11-05 — End: 2020-11-05

## 2020-11-05 MED ORDER — LIDOCAINE 2% (20 MG/ML) 5 ML SYRINGE
INTRAMUSCULAR | Status: DC | PRN
  Administered 2020-11-05: 40 mg via INTRAVENOUS

## 2020-11-05 NOTE — H&P (Addendum)
Cardiology Pre-Procedural Note:    Date:  11/05/2020   ID:  Johnathan Anderson, DOB 11-Oct-1965, MRN [id number]  PCP:  Lavone Orn, [place]  Cardiologist:  Werner Lean, MD  Advanced Practice Provider:  No care team member to display Electrophysiologist:  None       CC: Bicuspid Aortic Valve Pre-procedural assessment.  History of Present Illness:    Johnathan Anderson is a 55 y.o. male with a hx of recent echocardiogram 08/14/20 who presents for evaluation 10/04/20. Seen today 11/05/20.  Patient notes that he is doing fine.  Since last visit notes  changes.  There are no interval hospital/ED visit.    No chest pain or pressure (chest twinge has resolved).  No SOB/DOE and no PND/Orthopnea.  No weight gain or leg swelling.  No palpitations or syncope.  Here today with wife.  No swallowing difficulties or loose teeth.   History reviewed. No pertinent past medical history.  Past Surgical History:  Procedure Laterality Date  . APPENDECTOMY    . EYE SURGERY    . STERIOD INJECTION  09/16/2011   Procedure: MINOR STEROID INJECTION;  Surgeon: Lowella Grip, MD;  Location: [place];  Service: Orthopedics;  Laterality: Left;  Left Lumbar Five-Sacral One Transforaminal Epidural Steroid Injection    Current Medications: Current Meds  Medication Sig  . diclofenac Sodium (VOLTAREN) 1 % GEL Apply 1 application topically in the morning, at noon, and at bedtime.     Allergies:   Levaquin [levofloxacin]   Social History   Socioeconomic History  . Marital status: Married    Spouse name: Not on file  . Number of children: Not on file  . Years of education: Not on file  . Highest education level: Not on file  Occupational History  . Not on file  Tobacco Use  . Smoking status: Former Research scientist (life sciences)  . Smokeless tobacco: Never Used  Substance and Sexual Activity  . Alcohol use: No  . Drug use: No  . Sexual activity: Not on file  Other  Topics Concern  . Not on file  Social History Narrative  . Not on file   Social Determinants of Health   Financial Resource Strain: Not on file  Food Insecurity: Not on file  Transportation Needs: Not on file  Physical Activity: Not on file  Stress: Not on file  Social Connections: Not on file    Social: Comes with his wife; has many brothers and four sons; originally from [place]  Family History: The patient's family history includes Diabetes in his father; Hypertension in his mother. History of coronary artery disease notable possibly brother. History of heart failure notable for no members. History of arrhythmia notable for no members Family history of sudden cardiac death: Brother was totally fine, had an argument with his daughter, and died suddenly. No history of bicuspid aortic valve or aortic aneurysm. No history of cardiomyopathies including hypertrophic cardiomyopathy, left ventricular non-compaction, or arrhythmogenic right ventricular cardiomyopathy.  ROS:   Please see the history of present illness.     All other systems reviewed and are negative.  EKGs/Labs/Other Studies Reviewed:    The following studies were reviewed today:  EKG:  EKG is  ordered today.  The ekg ordered today demonstrates  10/05/20: Sinus bradycardia borderline LVH 08/19/20 OSH;  SR rate 60 borderline LVH  Transthoracic Echocardiogram: Date: 08/14/20 Results: AA 44 mm, Bicuspid calcific valve, cannot r/o calcification vs lesion on LVOT side, no  evidence of Coarctation; LVSVi 33; DVI 0.28, Acceleration time < 100 ms 1. Left ventricular ejection fraction, by estimation, is 60 to 65%. The  left ventricle has normal function. The left ventricle has no regional  wall motion abnormalities. Left ventricular diastolic parameters were  normal.  2. Right ventricular systolic function is normal. The right ventricular  size is normal. There is normal pulmonary artery systolic pressure.  3. The mitral  valve is normal in structure. No evidence of mitral valve  regurgitation. No evidence of mitral stenosis.  4. Mass noted attached to rt coronary cusp of the aortic valve measuring  ~7 mm. Differential diagnosis should include endocarditis, fibroelastome,  thrombus. TEE is recommended.. The aortic valve is bicuspid. There is mild  calcification of the aortic  valve. There is mild thickening of the aortic valve. Aortic valve  regurgitation is mild. Moderate aortic valve stenosis.  5. Aneurysm of the ascending aorta, measuring 44 mm.  6. The inferior vena cava is normal in size with greater than 50%  respiratory variability, suggesting right atrial pressure of 3 mmHg.    Recent Labs: No results found for requested labs within last 8760 hours.  Recent Lipid Panel No results found for: CHOL, TRIG, HDL, CHOLHDL, VLDL, LDLCALC, LDLDIRECT   Risk Assessment/Calculations:     N/A  Physical Exam:    VS:  BP (!) 142/91   Pulse 60   Temp (!) 97.5 F (36.4 C) (Temporal)   Resp 13   Ht 5\' 6"  (1.676 m)   Wt 90.7 kg   SpO2 96%   BMI 32.28 kg/m     Wt Readings from Last 3 Encounters:  11/05/20 90.7 kg  10/05/20 92.5 kg  02/24/14 86.2 kg    GEN:  Well nourished, well developed in no acute distress HEENT: Normal NECK: No JVD LYMPHATICS: No lymphadenopathy CARDIAC: RRR, III/VI systolic murmur with A2 still present and with no rubs, gallops RESPIRATORY:  Clear to auscultation without rales, wheezing or rhonchi  ABDOMEN: Soft, non-tender, non-distended MUSCULOSKELETAL:  No edema; No deformity  SKIN: Warm and dry NEUROLOGIC:  Alert and oriented x 3 PSYCHIATRIC:  Normal affect   ASSESSMENT:    1. Bicuspid aortic valve   2. Periodontitis   3. Family history of aortic dissection   4. Moderate aortic stenosis   5. Thoracic aortic aneurysm without rupture (HCC)    PLAN:    In order of problems listed above:  Asymptomatic thoracic aortic aneurysm Moderate Aortic  Stenosis Bicuspid Aortic Valve without Coarctation of the Aorta Possible Fhx Aortic Dissection (Brother) Peridontitis with dental work and possible aortic valve mass  - TEE for today     Shared Decision Making/Informed Consent The risks [esophageal damage, perforation (1:10,000 risk), bleeding, pharyngeal hematoma as well as other potential complications associated with conscious sedation including aspiration, arrhythmia, respiratory failure and death], benefits (treatment guidance and diagnostic support) and alternatives of a transesophageal echocardiogram were discussed in detail with Johnathan Anderson and he is willing to proceed.       Medication Adjustments/Labs and Tests Ordered: Current medicines are reviewed at length with the patient today.  Concerns regarding medicines are outlined above.  Orders Placed This Encounter  Procedures  . Diet NPO time specified  . Informed Consent Details: Physician/Practitioner Attestation; Transcribe to consent form and obtain patient signature  . Cardiac monitoring  . Vital signs  . Measure blood pressure  . Verify informed consent  . Remove and safely store all jewelry.  Tape rings in  place that cannot be removed.  . Patient to wear a single hospital gown - Ask patient to remove dentures, if any  . Positioning instruction  . Echo machine on patient's right. Verify adequate EKG signal.  . When patient fully awake discontinue Oxygen, change IV to saline lock and activity Ad Lib - Notify physician when completed  . Notify performing physician when patient is ready for discharge  . Pre-admission testing diagnosis  . Pulse oximetry, continuous  . Oxygen therapy Mode or (Route): Nasal cannula; Liters Per Minute: 2  . ECHO TEE  . Insert peripheral IV   Meds ordered this encounter  Medications  . 0.9 %  sodium chloride infusion    There are no outpatient Patient Instructions on file for this admission.   Signed, Werner Lean, MD   11/05/2020 7:41 AM    [facility]

## 2020-11-05 NOTE — CV Procedure (Signed)
    TRANSESOPHAGEAL ECHOCARDIOGRAM   NAME:  Johnathan Anderson    MRN: [id number] DOB:  12/08/1965    ADMIT DATE: 11/05/2020  INDICATIONS: Query or Aortic Valve Mass  PROCEDURE:   Informed consent was obtained prior to the procedure. The risks, benefits and alternatives for the procedure were discussed and the patient comprehended these risks.  Risks include, but are not limited to, cough, sore throat, vomiting, nausea, somnolence, esophageal and stomach trauma or perforation, bleeding, low blood pressure, aspiration, pneumonia, infection, trauma to the teeth and death.    Procedural time out performed. The oropharynx was anesthetized with topical 1% benzocaine.   Anesthesia was administered by anesthesia team and Dr. Ola Spurr.  The patient was administered a total of 450 mg propfol and 40 mg Lidocaine to achieve and maintain moderate to deep conscious sedation.  The patient's heart rate, blood pressure, and oxygen saturation are monitored continuously during the procedure. The period of conscious sedation is 34  minutes, of which I was present face-to-face 100% of this time.   The transesophageal probe was inserted in the esophagus and stomach without difficulty and multiple views were obtained.   COMPLICATIONS:    There were no immediate complications.  KEY FINDINGS:  1. Biscuspid Aortic Valve with Thoracic Aortic aneurysm with mild MR and AI findings are not sugggestive of aortic valve infective endocarditis 2. Full report to follow. 3. Further management per primary team.  4. Reaching out to patient's wife per his request  Rudean Haskell, MD [place]  8:36 AM

## 2020-11-05 NOTE — Anesthesia Preprocedure Evaluation (Signed)
Anesthesia Evaluation  Patient identified by MRN, date of birth, ID band Patient awake    Reviewed: Allergy & Precautions, NPO status , Patient's Chart, lab work & pertinent test results  Airway Mallampati: II  TM Distance: >3 FB     Dental   Pulmonary former smoker,    breath sounds clear to auscultation       Cardiovascular + Valvular Problems/Murmurs  Rhythm:Regular Rate:Normal     Neuro/Psych negative neurological ROS     GI/Hepatic negative GI ROS, Neg liver ROS,   Endo/Other  negative endocrine ROS  Renal/GU negative Renal ROS     Musculoskeletal   Abdominal   Peds  Hematology negative hematology ROS (+)   Anesthesia Other Findings   Reproductive/Obstetrics                             Anesthesia Physical Anesthesia Plan  ASA: III  Anesthesia Plan: MAC   Post-op Pain Management:    Induction:   PONV Risk Score and Plan: 1 and Propofol infusion and Treatment may vary due to age or medical condition  Airway Management Planned: Natural Airway and Nasal Cannula  Additional Equipment:   Intra-op Plan:   Post-operative Plan:   Informed Consent: I have reviewed the patients History and Physical, chart, labs and discussed the procedure including the risks, benefits and alternatives for the proposed anesthesia with the patient or authorized representative who has indicated his/her understanding and acceptance.       Plan Discussed with:   Anesthesia Plan Comments:         Anesthesia Quick Evaluation

## 2020-11-05 NOTE — Progress Notes (Signed)
  Echocardiogram Echocardiogram Transesophageal with color doppler, doppler, and 3D has been performed.  Darlina Sicilian M 11/05/2020, 8:54 AM

## 2020-11-05 NOTE — Transfer of Care (Signed)
Immediate Anesthesia Transfer of Care Note  Patient: Johnathan Anderson  Procedure(s) Performed: TRANSESOPHAGEAL ECHOCARDIOGRAM (TEE) (N/A )  Patient Location: Endoscopy Unit  Anesthesia Type:MAC  Level of Consciousness: sedated and responds to stimulation  Airway & Oxygen Therapy: Patient Spontanous Breathing and Patient connected to nasal cannula oxygen  Post-op Assessment: Report given to RN, Post -op Vital signs reviewed and stable and Patient moving all extremities  Post vital signs: Reviewed and stable  Last Vitals:  Vitals Value Taken Time  BP    Temp 36.6 C 11/05/20 0837  Pulse 54 11/05/20 0838  Resp 20 11/05/20 0838  SpO2 97 % 11/05/20 0838  Vitals shown include unvalidated device data.  Last Pain:  Vitals:   11/05/20 0837  TempSrc: Temporal  PainSc:          Complications: No complications documented.

## 2020-11-05 NOTE — Discharge Instructions (Signed)
Transesophageal Echocardiogram Transesophageal echocardiogram (TEE) is a test that uses sound waves to take pictures of your heart. TEE is done by passing a small probe attached to a flexible tube down the part of the body that moves food from your mouth to your stomach (esophagus). The pictures give clear images of your heart. This can help your doctor see if there are problems with your heart. Tell a doctor about:  Any allergies you have.  All medicines you are taking. This includes vitamins, herbs, eye drops, creams, and over-the-counter medicines.  Any problems you or family members have had with anesthetic medicines.  Any blood disorders you have.  Any surgeries you have had.  Any medical conditions you have.  Any swallowing problems.  Whether you have or have had a blockage in the part of the body that moves food from your mouth to your stomach.  Whether you are pregnant or may be pregnant. What are the risks? In general, this is a safe procedure. But, problems may occur, such as:  Damage to nearby structures or organs.  A tear in the part of the body that moves food from your mouth to your stomach.  Irregular heartbeat.  Hoarse voice or trouble swallowing.  Bleeding. What happens before the procedure? Medicines  Ask your doctor about changing or stopping: ? Your normal medicines. ? Vitamins, herbs, and supplements. ? Over-the-counter medicines.  Do not take aspirin or ibuprofen unless you are told to. General instructions  Follow instructions from your doctor about what you cannot eat or drink.  You will take out any dentures or dental retainers.  Plan to have a responsible adult take you home from the hospital or clinic.  Plan to have a responsible adult care for you for the time you are told after you leave the hospital or clinic. This is important. What happens during the procedure?  An IV will be put into one of your veins.  You may be given: ? A  sedative. This medicine helps you relax. ? A medicine to numb the back of your throat. This may be sprayed or gargled.  Your blood pressure, heart rate, and breathing will be watched.  You may be asked to lie on your left side.  A bite block will be placed in your mouth. This keeps you from biting the tube.  The tip of the probe will be placed into the back of your mouth.  You will be asked to swallow.  Your doctor will take pictures of your heart.  The probe and bite block will be taken out after the test is done. The procedure may vary among doctors and hospitals.   What can I expect after the procedure?  You will be monitored until you leave the hospital or clinic. This includes checking your blood pressure, heart rate, breathing rate, and blood oxygen level.  Your throat may feel sore and numb. This will get better over time. You will not be allowed to eat or drink until the numbness has gone away.  It is common to have a sore throat for a day or two.  It is up to you to get the results of your procedure. Ask how to get your results when they are ready. Follow these instructions at home:  If you were given a sedative during your procedure, do not drive or use machines until your doctor says that it is safe.  Return to your normal activities when your doctor says that it is safe.    Keep all follow-up visits. Summary  TEE is a test that uses sound waves to take pictures of your heart.  You will be given a medicine to help you relax.  Do not drive or use machines until your doctor says that it is safe. This information is not intended to replace advice given to you by your health care provider. Make sure you discuss any questions you have with your health care provider. Document Revised: 01/14/2020 Document Reviewed: 01/14/2020 Elsevier Patient Education  2021 [employer]  

## 2020-11-06 ENCOUNTER — Encounter (HOSPITAL_COMMUNITY): Payer: Self-pay | Admitting: Internal Medicine

## 2020-11-06 NOTE — Anesthesia Postprocedure Evaluation (Signed)
Anesthesia Post Note  Patient: Johnathan Anderson  Procedure(s) Performed: TRANSESOPHAGEAL ECHOCARDIOGRAM (TEE) (N/A )     Patient location during evaluation: PACU Anesthesia Type: MAC Level of consciousness: awake and alert Pain management: pain level controlled Vital Signs Assessment: post-procedure vital signs reviewed and stable Respiratory status: spontaneous breathing, nonlabored ventilation, respiratory function stable and patient connected to nasal cannula oxygen Cardiovascular status: stable and blood pressure returned to baseline Postop Assessment: no apparent nausea or vomiting Anesthetic complications: no   No complications documented.  Last Vitals:  Vitals:   11/05/20 0848 11/05/20 0855  BP: 113/64 133/86  Pulse: (!) 50 62  Resp: 20 16  Temp:    SpO2: 97% 99%    Last Pain:  Vitals:   11/05/20 0855  TempSrc:   PainSc: 0-No pain                 Tiajuana Amass

## 2020-11-17 ENCOUNTER — Other Ambulatory Visit: Payer: Self-pay

## 2020-11-17 DIAGNOSIS — I712 Thoracic aortic aneurysm, without rupture, unspecified: Secondary | ICD-10-CM

## 2020-11-17 DIAGNOSIS — I350 Nonrheumatic aortic (valve) stenosis: Secondary | ICD-10-CM

## 2020-11-17 DIAGNOSIS — Q231 Congenital insufficiency of aortic valve: Secondary | ICD-10-CM

## 2020-11-18 ENCOUNTER — Telehealth: Payer: Self-pay | Admitting: Internal Medicine

## 2020-11-18 NOTE — Telephone Encounter (Signed)
Verbally reviewed instructions for 11/30/20 TAVR CT.  He verbalizes understanding and is aware that I have sent instructions to him via mail.  He thanked me for the return call.

## 2020-11-18 NOTE — Telephone Encounter (Signed)
Patient would like more information regarding his upcoming CT appointment 11/27/20. Please call

## 2020-11-18 NOTE — Progress Notes (Signed)
Called patient to review CT instructions.  Left a message to call back.  Placed instruction letter in the mail for patient to review also.

## 2020-11-30 ENCOUNTER — Ambulatory Visit (HOSPITAL_COMMUNITY)
Admission: RE | Admit: 2020-11-30 | Discharge: 2020-11-30 | Disposition: A | Discharge status: Home / Self Care | Payer: 59 | Source: Ambulatory Visit | Attending: Internal Medicine | Admitting: Internal Medicine

## 2020-11-30 ENCOUNTER — Other Ambulatory Visit: Payer: Self-pay

## 2020-11-30 DIAGNOSIS — I350 Nonrheumatic aortic (valve) stenosis: Secondary | ICD-10-CM

## 2020-11-30 DIAGNOSIS — I712 Thoracic aortic aneurysm, without rupture, unspecified: Secondary | ICD-10-CM

## 2020-11-30 DIAGNOSIS — Q231 Congenital insufficiency of aortic valve: Secondary | ICD-10-CM

## 2020-11-30 DIAGNOSIS — K0530 Chronic periodontitis, unspecified: Secondary | ICD-10-CM

## 2020-11-30 DIAGNOSIS — Z8249 Family history of ischemic heart disease and other diseases of the circulatory system: Secondary | ICD-10-CM

## 2020-11-30 MED ORDER — IOHEXOL 350 MG/ML SOLN
100.0000 mL | Freq: Once | INTRAVENOUS | Status: AC | PRN
  Administered 2020-11-30: 100 mL via INTRAVENOUS

## 2020-12-02 ENCOUNTER — Telehealth: Payer: Self-pay

## 2020-12-02 DIAGNOSIS — I712 Thoracic aortic aneurysm, without rupture, unspecified: Secondary | ICD-10-CM

## 2020-12-02 NOTE — Telephone Encounter (Signed)
-----   Message from Werner Lean, MD sent at 11/30/2020  5:53 PM EDT ----- Results: Small lower lobe pulmonary nodules less than 5 mm Moderate Thoracic Aortic Aneurysm Stable Bicuspid Aortic Valve Plan: Will need CT-Aorta in 6 months.  We will follow him aggressive with the goal of avoiding what happened to his brother (patient had a family history of dissection in the family); surgery goal for this patient would be 5.0 not 5.5 cm   Werner Lean, MD

## 2020-12-02 NOTE — Telephone Encounter (Signed)
Orders placed for CT aorta to be completed in 6 months.

## 2020-12-03 ENCOUNTER — Other Ambulatory Visit: Payer: Self-pay

## 2020-12-03 ENCOUNTER — Encounter (HOSPITAL_BASED_OUTPATIENT_CLINIC_OR_DEPARTMENT_OTHER): Payer: Self-pay | Admitting: Emergency Medicine

## 2020-12-03 ENCOUNTER — Emergency Department (HOSPITAL_BASED_OUTPATIENT_CLINIC_OR_DEPARTMENT_OTHER): Payer: 59

## 2020-12-03 ENCOUNTER — Emergency Department (HOSPITAL_BASED_OUTPATIENT_CLINIC_OR_DEPARTMENT_OTHER)
Admission: EM | Admit: 2020-12-03 | Discharge: 2020-12-03 | Disposition: A | Discharge status: Home / Self Care | Payer: 59 | Attending: Emergency Medicine | Admitting: Emergency Medicine

## 2020-12-03 DIAGNOSIS — H538 Other visual disturbances: Secondary | ICD-10-CM | POA: Insufficient documentation

## 2020-12-03 DIAGNOSIS — Z87891 Personal history of nicotine dependence: Secondary | ICD-10-CM | POA: Insufficient documentation

## 2020-12-03 DIAGNOSIS — R519 Headache, unspecified: Secondary | ICD-10-CM

## 2020-12-03 DIAGNOSIS — R202 Paresthesia of skin: Secondary | ICD-10-CM | POA: Diagnosis not present

## 2020-12-03 LAB — BASIC METABOLIC PANEL
Anion gap: 9 (ref 5–15)
BUN: 26 mg/dL — ABNORMAL HIGH (ref 6–20)
CO2: 26 mmol/L (ref 22–32)
Calcium: 9.3 mg/dL (ref 8.9–10.3)
Chloride: 104 mmol/L (ref 98–111)
Creatinine, Ser: 1.28 mg/dL — ABNORMAL HIGH (ref 0.61–1.24)
GFR, Estimated: >60 mL/min (ref >60)
Glucose, Bld: 121 mg/dL — ABNORMAL HIGH (ref 70–99)
Potassium: 3.5 mmol/L (ref 3.5–5.1)
Sodium: 139 mmol/L (ref 135–145)

## 2020-12-03 LAB — CBC WITH DIFFERENTIAL/PLATELET
Abs Immature Granulocytes: 0.02 10*3/uL (ref 0.00–0.07)
Basophils Absolute: 0 10*3/uL (ref 0.0–0.1)
Basophils Relative: 0 %
Eosinophils Absolute: 0.1 10*3/uL (ref 0.0–0.5)
Eosinophils Relative: 1 %
HCT: 42 % (ref 39.0–52.0)
Hemoglobin: 14.6 g/dL (ref 13.0–17.0)
Immature Granulocytes: 0 %
Lymphocytes Relative: 43 %
Lymphs Abs: 3.9 10*3/uL (ref 0.7–4.0)
MCH: 29.8 pg (ref 26.0–34.0)
MCHC: 34.8 g/dL (ref 30.0–36.0)
MCV: 85.7 fL (ref 80.0–100.0)
Monocytes Absolute: 0.7 10*3/uL (ref 0.1–1.0)
Monocytes Relative: 8 %
Neutro Abs: 4.4 10*3/uL (ref 1.7–7.7)
Neutrophils Relative %: 48 %
Platelets: 164 10*3/uL (ref 150–400)
RBC: 4.9 MIL/uL (ref 4.22–5.81)
RDW: 13.3 % (ref 11.5–15.5)
WBC: 9.2 10*3/uL (ref 4.0–10.5)
nRBC: 0 % (ref 0.0–0.2)

## 2020-12-03 LAB — CBG MONITORING, ED: Glucose-Capillary: 106 mg/dL — ABNORMAL HIGH (ref 70–99)

## 2020-12-03 LAB — HEPATIC FUNCTION PANEL
ALT: 37 U/L (ref 0–44)
AST: 28 U/L (ref 15–41)
Albumin: 4.2 g/dL (ref 3.5–5.0)
Alkaline Phosphatase: 59 U/L (ref 38–126)
Bilirubin, Direct: 0.1 mg/dL (ref 0.0–0.2)
Indirect Bilirubin: 0.3 mg/dL (ref 0.3–0.9)
Total Bilirubin: 0.4 mg/dL (ref 0.3–1.2)
Total Protein: 7.6 g/dL (ref 6.5–8.1)

## 2020-12-03 NOTE — ED Notes (Signed)
ED Provider at bedside. 

## 2020-12-03 NOTE — ED Provider Notes (Signed)
[place] EMERGENCY DEPARTMENT Provider Note   CSN: [id number] Arrival date & time: 12/03/20  1932     History Chief Complaint  Patient presents with   Headache    Johnathan Anderson is a 55 y.o. male.  The history is provided by the patient.  Headache Pain location:  Generalized Quality:  Dull Severity currently:  0/10 Severity at highest:  5/10 Onset quality:  Gradual Duration:  4 hours Progression:  Resolved Chronicity:  New Relieved by:  Nothing Worsened by:  Nothing Associated symptoms: blurred vision, numbness (tingling feeling in face and arms) and tingling (face and arms)   Associated symptoms: no abdominal pain, no back pain, no cough, no ear pain, no eye pain, no fever, no seizures, no sore throat, no vomiting and no weakness       History reviewed. No pertinent past medical history.  Patient Active Problem List   Diagnosis Date Noted   Thoracic aortic aneurysm without rupture ([place]) 10/05/2020   Moderate aortic stenosis 10/05/2020   Bicuspid aortic valve 10/05/2020   Family history of aortic dissection 10/05/2020   Periodontitis 10/05/2020   Memory loss 02/24/2014   Chronic back pain 02/24/2014    Past Surgical History:  Procedure Laterality Date   APPENDECTOMY     EYE SURGERY     STERIOD INJECTION  09/16/2011   Procedure: MINOR STEROID INJECTION;  Surgeon: Lowella Grip, MD;  Location: [facility];  Service: Orthopedics;  Laterality: Left;  Left Lumbar Five-Sacral One Transforaminal Epidural Steroid Injection   TEE WITHOUT CARDIOVERSION N/A 11/05/2020   Procedure: TRANSESOPHAGEAL ECHOCARDIOGRAM (TEE);  Surgeon: Werner Lean, MD;  Location: [facility] ENDOSCOPY;  Service: Cardiovascular;  Laterality: N/A;       Family History  Problem Relation Age of Onset   Diabetes Father    Hypertension Mother     Social History   Tobacco Use   Smoking status: Former    Pack years: 0.00   Smokeless tobacco: Never   Substance Use Topics   Alcohol use: No   Drug use: No    Home Medications Prior to Admission medications   Medication Sig Start Date End Date Taking? Authorizing Provider  diclofenac Sodium (VOLTAREN) 1 % GEL Apply 1 application topically in the morning, at noon, and at bedtime. 07/15/20   [provider]    Allergies    Levaquin [levofloxacin]  Review of Systems   Review of Systems  Constitutional:  Negative for chills and fever.  HENT:  Negative for ear pain and sore throat.   Eyes:  Positive for blurred vision. Negative for pain and visual disturbance.  Respiratory:  Negative for cough and shortness of breath.   Cardiovascular:  Negative for chest pain and palpitations.  Gastrointestinal:  Negative for abdominal pain and vomiting.  Genitourinary:  Negative for dysuria and hematuria.  Musculoskeletal:  Negative for arthralgias and back pain.  Skin:  Negative for color change and rash.  Neurological:  Positive for light-headedness, numbness (tingling feeling in face and arms) and headaches. Negative for tremors, seizures, syncope, facial asymmetry, speech difficulty and weakness.  All other systems reviewed and are negative.  Physical Exam Updated Vital Signs Pulse 67   Temp 98 F (36.7 C) (Oral)   Resp 17   Ht 5\' 6"  (1.676 m)   Wt 95.3 kg   SpO2 100%   BMI 33.91 kg/m   Physical Exam Vitals and nursing note reviewed.  Constitutional:      Appearance: He is  well-developed.  HENT:     Head: Normocephalic and atraumatic.  Eyes:     General: No visual field deficit.    Extraocular Movements: Extraocular movements intact.     Conjunctiva/sclera: Conjunctivae normal.     Pupils: Pupils are equal, round, and reactive to light.  Cardiovascular:     Rate and Rhythm: Normal rate and regular rhythm.     Heart sounds: No murmur heard. Pulmonary:     Effort: Pulmonary effort is normal. No respiratory distress.     Breath sounds: Normal breath sounds.  Abdominal:      Palpations: Abdomen is soft.     Tenderness: There is no abdominal tenderness.  Musculoskeletal:     Cervical back: Normal range of motion and neck supple.  Skin:    General: Skin is warm and dry.  Neurological:     Mental Status: He is alert and oriented to person, place, and time.     Cranial Nerves: No cranial nerve deficit, dysarthria or facial asymmetry.     Sensory: No sensory deficit.     Motor: No weakness.     Coordination: Coordination normal.     Gait: Gait normal.     Comments: 5+ out of 5 strength throughout, normal sensation, no drift, normal speech, normal finger-nose-finger, no visual field deficit  Psychiatric:        Mood and Affect: Mood normal.    ED Results / Procedures / Treatments   Labs (all labs ordered are listed, but only abnormal results are displayed) Labs Reviewed  BASIC METABOLIC PANEL - Abnormal; Notable for the following components:      Result Value   Glucose, Bld 121 (*)    BUN 26 (*)    Creatinine, Ser 1.28 (*)    All other components within normal limits  CBG MONITORING, ED - Abnormal; Notable for the following components:   Glucose-Capillary 106 (*)    All other components within normal limits  CBC WITH DIFFERENTIAL/PLATELET  HEPATIC FUNCTION PANEL    EKG EKG Interpretation  Date/Time:  Thursday December 03 2020 19:38:45 EDT Ventricular Rate:  75 PR Interval:  169 QRS Duration: 127 QT Interval:  396 QTC Calculation: 443 R Axis:   -24 Text Interpretation: Sinus rhythm Left ventricular hypertrophy Confirmed by Lennice Sites ([id number]) on 12/03/2020 7:43:49 PM  Radiology CT Head Wo Contrast  Result Date: 12/03/2020 CLINICAL DATA:  55 year old male with headache. EXAM: CT HEAD WITHOUT CONTRAST TECHNIQUE: Contiguous axial images were obtained from the base of the skull through the vertex without intravenous contrast. COMPARISON:  Brain MRI dated 01/21/2014. FINDINGS: Brain: The ventricles and sulci appropriate size for patient's age. The  gray-white matter discrimination is preserved. There is no acute intracranial hemorrhage. No mass effect or midline shift. No extra-axial fluid collection. Vascular: No hyperdense vessel or unexpected calcification. Skull: Normal. Negative for fracture or focal lesion. Sinuses/Orbits: No acute finding. Other: None IMPRESSION: Unremarkable noncontrast CT of the brain. Electronically Signed   By: Anner Crete M.D.   On: 12/03/2020 20:10    Procedures Procedures   Medications Ordered in ED Medications - No data to display  ED Course  I have reviewed the triage vital signs and the nursing notes.  Pertinent labs & imaging results that were available during my care of the patient were reviewed by me and considered in my medical decision making (see chart for details).    MDM Rules/Calculators/A&P  Johnathan Anderson is a 55 year old male with no significant medical history presents the ED with headache.  Patient with normal vitals.  No fever.  Patient states that he had a headache that started about 4 hours ago.  Felt some tingling throughout his face and both arms and some lightheadedness.  May be some blurred vision.  He took ibuprofen and symptoms of completely resolved.  He felt like he was taking some time for symptoms to get better but by the time he is got here he feels a lot better.  Neurologically he is intact.  Symptoms are consistent with a TIA or stroke and overall sound like may be a complex migraine or may be anxiety.  EKG shows sinus rhythm.  Head CT was normal and no head bleed.  No significant anemia, electrolyte abnormality, kidney injury.  Was given reassurance and discharged in ED in good condition.  Understands return precautions.  This chart was dictated using voice recognition software.  Despite best efforts to proofread,  errors can occur which can change the documentation meaning.   Final Clinical Impression(s) / ED Diagnoses Final diagnoses:   Nonintractable headache, unspecified chronicity pattern, unspecified headache type    Rx / DC Orders ED Discharge Orders     None        Lennice Sites, DO 12/03/20 2047

## 2020-12-03 NOTE — ED Triage Notes (Signed)
Pt c/o feeling dizzy, some blurred vision, and some numbness all over face. Pt states that this has been doing on for approx 1 hr. Pt states that the tingling has gotten better. Pt was able to ambulate from waiting room to treatment room with steady gait. Pt states that he his better but just doesn't quite feel like himself. Pt aaox3, ambulatory with steady gait, VSS, GCS 15, NAD noted while triaging.

## 2021-02-19 ENCOUNTER — Ambulatory Visit: Payer: 59 | Admitting: Neurology

## 2021-02-19 ENCOUNTER — Encounter: Payer: Self-pay | Admitting: Neurology

## 2021-02-19 VITALS — BP 142/90 | HR 59 | Ht 66.0 in | Wt 207.0 lb

## 2021-02-19 DIAGNOSIS — R519 Headache, unspecified: Secondary | ICD-10-CM | POA: Diagnosis not present

## 2021-02-19 DIAGNOSIS — R4701 Aphasia: Secondary | ICD-10-CM

## 2021-02-19 DIAGNOSIS — R51 Headache with orthostatic component, not elsewhere classified: Secondary | ICD-10-CM

## 2021-02-19 DIAGNOSIS — R42 Dizziness and giddiness: Secondary | ICD-10-CM

## 2021-02-19 DIAGNOSIS — R200 Anesthesia of skin: Secondary | ICD-10-CM | POA: Diagnosis not present

## 2021-02-19 DIAGNOSIS — G43109 Migraine with aura, not intractable, without status migrainosus: Secondary | ICD-10-CM | POA: Diagnosis not present

## 2021-02-19 MED ORDER — TOPIRAMATE 50 MG PO TABS: 50.0000 mg | ORAL_TABLET | Freq: Every day | ORAL | 11 refills | Status: DC

## 2021-02-19 MED ORDER — RIZATRIPTAN BENZOATE 10 MG PO TBDP: 10.0000 mg | ORAL_TABLET | ORAL | 11 refills | Status: DC | PRN

## 2021-02-19 NOTE — Progress Notes (Signed)
[id number]    Provider:  Dr Jaynee Eagles Requesting Provider: Lavone Orn, MD Primary Care Provider:  Lavone Orn, MD  CC:  headache  HPI:  Johnathan Anderson is a 55 y.o. male here as requested by Lavone Orn, MD for headache vs TIA.PMHx vertigo.   In June he was working, he was walking, they went home and he started feeling a weird sensation in his head like dizziness, talking weird, his left arm went numb and left face went numb. He couldn't get the words out, dragging of his words. Went to the ED, they did not find anything abnormal, went home with continued symptoms. He gets symptoms with pressure in his forehead and eyeballs and ears and in the back of his head pressure. He feels numbness in the left arm and in the left check, usually all on the left side but headache is holocephalic, tylenol does not help, in between episodes symptoms go away but headaches and symptoms  can be triggered with motion, when traffic is passing by, or at night with the lights, can be bad when watching TV. He has an eye appointment upcoming to check his eyes. When he gets headache light bothers him, worse positionally and with movement, pulsating/pounding.throbbing,light sensitivity, sound sensitivity, he is smell sensitive, nausea, he was using chlorox and it caused a headache one day for example, some nausea with the headaches. Also some dizziness/imbalance but not spinning. No other focal neurologic deficits, associated symptoms, inciting events or modifiable factors. >15 headache days a month and > 8 migraine days a month.   Reviewed notes, labs and imaging from outside physicians, which showed:  CT head 12/03/20: showed No acute intracranial abnormalities including mass lesion or mass effect, hydrocephalus, extra-axial fluid collection, midline shift, hemorrhage, or acute infarction, large ischemic events (personally reviewed images)    Tried: amitriptyline, propranolol contraindicated due  to bradycardia  Review of Systems: Patient complains of symptoms per HPI as well as the following symptoms headaches. Pertinent negatives and positives per HPI. All others negative.   Social History   Socioeconomic History   Marital status: Married    Spouse name: Not on file   Number of children: Not on file   Years of education: Not on file   Highest education level: Not on file  Occupational History   Not on file  Tobacco Use   Smoking status: Former   Smokeless tobacco: Never  Substance and Sexual Activity   Alcohol use: No   Drug use: No   Sexual activity: Not on file  Other Topics Concern   Not on file  Social History Narrative   Not on file   Social Determinants of Health   Financial Resource Strain: Not on file  Food Insecurity: Not on file  Transportation Needs: Not on file  Physical Activity: Not on file  Stress: Not on file  Social Connections: Not on file  Intimate Partner Violence: Not on file    Family History  Problem Relation Age of Onset   Hypertension Mother    Diabetes Father    Migraines Neg Hx     Past Medical History:  Diagnosis Date   Vertigo     Patient Active Problem List   Diagnosis Date Noted   Migraine with aura and without status migrainosus, not intractable 02/19/2021   Thoracic aortic aneurysm without rupture ([place]) 10/05/2020   Moderate aortic stenosis 10/05/2020   Bicuspid aortic valve 10/05/2020   Family history of aortic dissection 10/05/2020   Periodontitis  10/05/2020   Memory loss 02/24/2014   Chronic back pain 02/24/2014    Past Surgical History:  Procedure Laterality Date   APPENDECTOMY     EYE SURGERY     STERIOD INJECTION  09/16/2011   Procedure: MINOR STEROID INJECTION;  Surgeon: Lowella Grip, MD;  Location: [facility];  Service: Orthopedics;  Laterality: Left;  Left Lumbar Five-Sacral One Transforaminal Epidural Steroid Injection   TEE WITHOUT CARDIOVERSION N/A 11/05/2020   Procedure:  TRANSESOPHAGEAL ECHOCARDIOGRAM (TEE);  Surgeon: Werner Lean, MD;  Location: [facility] ENDOSCOPY;  Service: Cardiovascular;  Laterality: N/A;    Current Outpatient Medications  Medication Sig Dispense Refill   diclofenac Sodium (VOLTAREN) 1 % GEL Apply 1 application topically in the morning, at noon, and at bedtime.     famotidine (PEPCID) 40 MG tablet Take 40 mg by mouth at bedtime as needed.     rizatriptan (MAXALT-MLT) 10 MG disintegrating tablet Take 1 tablet (10 mg total) by mouth as needed for migraine. May repeat in 2 hours if needed 9 tablet 11   topiramate (TOPAMAX) 50 MG tablet Take 1 tablet (50 mg total) by mouth at bedtime. 30 tablet 11   No current facility-administered medications for this visit.    Allergies as of 02/19/2021 - Review Complete 02/19/2021  Allergen Reaction Noted   Levaquin [levofloxacin]  10/05/2020    Vitals: BP (!) 142/90   Pulse (!) 59   Ht '5\' 6"'$  (1.676 m)   Wt 207 lb (93.9 kg)   BMI 33.41 kg/m  Last Weight:  Wt Readings from Last 1 Encounters:  02/19/21 207 lb (93.9 kg)   Last Height:   Ht Readings from Last 1 Encounters:  02/19/21 '5\' 6"'$  (1.676 m)     Physical exam: Exam: Gen: NAD, conversant, well nourised, obese, well groomed                     CV: RRR, no MRG. No Carotid Bruits. No peripheral edema, warm, nontender Eyes: Conjunctivae clear without exudates or hemorrhage  Neuro: Detailed Neurologic Exam  Speech:    Speech is normal; fluent and spontaneous with normal comprehension.  Cognition:    The patient is oriented to person, place, and time;     recent and remote memory intact;     language fluent;     normal attention, concentration,     fund of knowledge Cranial Nerves:    The pupils are equal, round, and reactive to light. The fundi are flat. Visual fields are full to finger confrontation. Extraocular movements are intact. Trigeminal sensation is intact and the muscles of mastication are normal. The face is  symmetric. The palate elevates in the midline. Hearing intact. Voice is normal. Shoulder shrug is normal. The tongue has normal motion without fasciculations.   Coordination:    Normal   Gait:    normal.   Motor Observation:    No asymmetry, no atrophy, and no involuntary movements noted. Tone:    Normal muscle tone.    Posture:    Posture is normal. normal erect    Strength:    Strength is V/V in the upper and lower limbs.      Sensation: intact to LT     Reflex Exam:  DTR's:    Deep tendon reflexes in the upper and lower extremities are normal bilaterally.   Toes:    The toes are downgoing bilaterally.   Clonus:    Clonus is absent.  Assessment/Plan:  Patient with stereotyped episodes of migrainous headaches with left arm and face sensory symptoms, speech problems. Sounds like migraine with aura but need to rule out other causes such as stroke, lesions, demyelination, compressive masses, seizure focus, or other, CT was neg but needs MRI brain. Failed amitriptyline. Cannot use propranolol due to bradycardia. Try topiramate. Next can try a CGRP medication. Needs MRI brain to ensure no other causes such as stroke, mass, demyelinating lesion or other.  MRI of the brain w/wo Start Topiramate at bedtime for prevention of migraine Rizatriptan: Please take one tablet at the onset of your headache. If it does not improve the symptoms please take one additional tablet. Do not take more then 2 tablets in 24hrs. Do not take use more then 2 to 3 times in a week. If MRI is normal (no strokes) and the medication above is not working I recommend trying newer migraine medications such as Emgality or Ajovy Consider sleep apnea testing, talk to partner about snoring  Orders Placed This Encounter  Procedures   MR BRAIN W WO CONTRAST    Meds ordered this encounter  Medications   topiramate (TOPAMAX) 50 MG tablet    Sig: Take 1 tablet (50 mg total) by mouth at bedtime.    Dispense:  30  tablet    Refill:  11   rizatriptan (MAXALT-MLT) 10 MG disintegrating tablet    Sig: Take 1 tablet (10 mg total) by mouth as needed for migraine. May repeat in 2 hours if needed    Dispense:  9 tablet    Refill:  11     Cc: Lavone Orn, MD,  Lavone Orn, MD  Sarina Ill, MD  [facility] [address] [place], [place] [address]  Phone [phone] Fax [phone]

## 2021-02-19 NOTE — Patient Instructions (Addendum)
MRI of the brain with contrast Start Topiramate at bedtime for prevention of migraines Rizatriptan: Please take one tablet at the onset of your headache. If it does not improve the symptoms please take one additional tablet. Do not take more then 2 tablets in 24hrs. Do not take use more then 2 to 3 times in a week.  If MRI is normal (no strokes) and the medication aboveis not working I recommend trying newer migraine medications such as Emgality or Ajovy Consider sleep apnea testing, talk to partner about snoring  Migraine Headache A migraine headache is an intense, throbbing pain on one side or both sides of the head. Migraine headaches may also cause other symptoms, such as nausea, vomiting, and sensitivity to light and noise. A migraine headache can last from 4 hours to 3 days. Talk with your doctor about what things may bring on (trigger) your migraine headaches. What are the causes? The exact cause of this condition is not known. However, a migraine may be caused when nerves in the brain become irritated and release chemicals that cause inflammation of blood vessels. This inflammation causes pain. This condition may be triggered or caused by: Drinking alcohol. Smoking. Taking medicines, such as: Medicine used to treat chest pain (nitroglycerin). Birth control pills. Estrogen. Certain blood pressure medicines. Eating or drinking products that contain nitrates, glutamate, aspartame, or tyramine. Aged cheeses, chocolate, or caffeine may also be triggers. Doing physical activity. Other things that may trigger a migraine headache include: Menstruation. Pregnancy. Hunger. Stress. Lack of sleep or too much sleep. Weather changes. Fatigue. What increases the risk? The following factors may make you more likely to experience migraine headaches: Being a certain age. This condition is more common in people who are 43-49 years old. Being male. Having a family history of migraine  headaches. Being Caucasian. Having a mental health condition, such as depression or anxiety. Being obese. What are the signs or symptoms? The main symptom of this condition is pulsating or throbbing pain. This pain may: Happen in any area of the head, such as on one side or both sides. Interfere with daily activities. Get worse with physical activity. Get worse with exposure to bright lights or loud noises. Other symptoms may include: Nausea. Vomiting. Dizziness. General sensitivity to bright lights, loud noises, or smells. Before you get a migraine headache, you may get warning signs (an aura). An aura may include: Seeing flashing lights or having blind spots. Seeing bright spots, halos, or zigzag lines. Having tunnel vision or blurred vision. Having numbness or a tingling feeling. Having trouble talking. Having muscle weakness. Some people have symptoms after a migraine headache (postdromal phase), such as: Feeling tired. Difficulty concentrating. How is this diagnosed? A migraine headache can be diagnosed based on: Your symptoms. A physical exam. Tests, such as: CT scan or an MRI of the head. These imaging tests can help rule out other causes of headaches. Taking fluid from the spine (lumbar puncture) and analyzing it (cerebrospinal fluid analysis, or CSF analysis). How is this treated? This condition may be treated with medicines that: Relieve pain. Relieve nausea. Prevent migraine headaches. Treatment for this condition may also include: Acupuncture. Lifestyle changes like avoiding foods that trigger migraine headaches. Biofeedback. Cognitive behavioral therapy. Follow these instructions at home: Medicines Take over-the-counter and prescription medicines only as told by your health care provider. Ask your health care provider if the medicine prescribed to you: Requires you to avoid driving or using heavy machinery. Can cause constipation. You may need  to take these  actions to prevent or treat constipation: Drink enough fluid to keep your urine pale yellow. Take over-the-counter or prescription medicines. Eat foods that are high in fiber, such as beans, whole grains, and fresh fruits and vegetables. Limit foods that are high in fat and processed sugars, such as fried or sweet foods. Lifestyle Do not drink alcohol. Do not use any products that contain nicotine or tobacco, such as cigarettes, e-cigarettes, and chewing tobacco. If you need help quitting, ask your health care provider. Get at least 8 hours of sleep every night. Find ways to manage stress, such as meditation, deep breathing, or yoga. General instructions   Keep a journal to find out what may trigger your migraine headaches. For example, write down: What you eat and drink. How much sleep you get. Any change to your diet or medicines. If you have a migraine headache: Avoid things that make your symptoms worse, such as bright lights. It may help to lie down in a dark, quiet room. Do not drive or use heavy machinery. Ask your health care provider what activities are safe for you while you are experiencing symptoms. Keep all follow-up visits as told by your health care provider. This is important. Contact a health care provider if: You develop symptoms that are different or more severe than your usual migraine headache symptoms. You have more than 15 headache days in one month. Get help right away if: Your migraine headache becomes severe. Your migraine headache lasts longer than 72 hours. You have a fever. You have a stiff neck. You have vision loss. Your muscles feel weak or like you cannot control them. You start to lose your balance often. You have trouble walking. You faint. You have a seizure. Summary A migraine headache is an intense, throbbing pain on one side or both sides of the head. Migraines may also cause other symptoms, such as nausea, vomiting, and sensitivity to light  and noise. This condition may be treated with medicines and lifestyle changes. You may also need to avoid certain things that trigger a migraine headache. Keep a journal to find out what may trigger your migraine headaches. Contact your health care provider if you have more than 15 headache days in a month or you develop symptoms that are different or more severe than your usual migraine headache symptoms. This information is not intended to replace advice given to you by your health care provider. Make sure you discuss any questions you have with your health care provider. Document Revised: 09/14/2018 Document Reviewed: 07/05/2018 Elsevier Patient Education  2022 [employer].   Transient Ischemic Attack A transient ischemic attack (TIA) causes stroke-like symptoms that go away quickly. Having a TIA means that a person is at higher risk for a stroke. A TIA happens when blood supply to the brain is blocked temporarily. A TIA is a medical emergency. What are the causes? This condition is caused by a temporary blockage in an artery in the head or neck. This means the brain does not get the blood supply it needs. There is no permanent brain damage with a TIA. A blockage can be caused by: Fatty buildup in an artery in the head or neck (atherosclerosis). A blood clot. An artery tear (dissection). Inflammation of an artery (vasculitis). Sometimes the cause is not known. What increases the risk? Certain factors may make you more likely to develop this condition. Some of these are things that you can change, such as: Obesity. Using products that  contain nicotine or tobacco. Taking oral birth control, especially if you also use tobacco. Not being active. Heavy alcohol use. Drug use, especially cocaine and methamphetamine. Medical conditions that may increase your risk include: High blood pressure (hypertension). High cholesterol. Diabetes. Heart disease (coronary artery disease). An irregular  heartbeat, also called atrial fibrillation (AFib). Sickle cell disease. Sleep problems (sleep apnea). Chronic inflammatory diseases, such as rheumatoid arthritis or lupus. Blood clotting disorders (hypercoagulable state). Other risk factors include: Being over the age of 62. Being male. Family history of stroke. Previous history of blood clots, stroke, TIA, or heart attack. Having a history of preeclampsia. Migraine headache. What are the signs or symptoms? Symptoms of a TIA are the same as those of a stroke. The symptoms develop suddenly, and then go away quickly. They may include: Weakness or numbness in your face, arm, or leg, especially on one side of your body. Trouble walking or moving your arms or legs. Trouble speaking, understanding speech, or both (aphasia). Vision changes, such as double vision, blurred vision, or loss of vision. Dizziness. Confusion. Loss of balance or coordination. Nausea and vomiting. Severe headache. If possible, note what time your symptoms started. Tell your health care provider. How is this diagnosed? This condition may be diagnosed based on: Your symptoms and medical history. A physical exam. Imaging tests, usually a CT scan or MRI of the brain. Blood tests. You may also have other tests, including: Electrocardiogram (ECG). Echocardiogram. Carotid ultrasound. A scan of blood circulation in the brain (CT angiogram or MR angiogram). Continuous heart monitoring. How is this treated? The goal of treatment is to reduce the risk for a stroke. Stroke prevention therapies may include: Changes to diet and lifestyle, such as being physically active and stopping smoking. Medicines to thin the blood (antiplatelets or anticoagulants). Blood pressure medicines. Medicines to reduce cholesterol. Treating other health conditions, such as diabetes or AFib. If testing shows a narrowing in the arteries to your brain, your health care provider may recommend a  procedure, such as: Carotid endarterectomy. This is done to remove the blockage from your artery. Carotid angioplasty and stenting. This uses a tube (stent) to open or widen an artery in the neck. The stent helps keep the artery open by supporting the artery walls. Follow these instructions at home: Medicines Take over-the-counter and prescription medicines only as told by your health care provider. If you were told to take a medicine to thin your blood, such as aspirin or an anticoagulant, take it exactly as told by your health care provider. Taking too much blood-thinning medicine can cause bleeding. Taking too little will not protect you against a stroke and other problems. Eating and drinking  Eat 5 or more servings of fruits and vegetables each day. Follow guidelines from your health care provider about your diet. You may need to follow a certain diet to help manage risk factors for stroke. This may include: Eating a low-fat, low-salt diet. Choosing high-fiber foods. Limiting carbohydrates and sugar. If you drink alcohol: Limit how much you have to: 0-1 drink a day for women who are not pregnant. 0-2 drinks a day for men. Know how much alcohol is in a drink. In the [place] one drink equals one 12 oz bottle of beer (370m), one 5 oz glass of wine (1480m, or one 1 oz glass of hard liquor (4412m General instructions Maintain a healthy weight. Try to get at least 30 minutes of exercise on most days. Get treatment if you have sleep apnea.  Do not use any products that contain nicotine or tobacco. These products include cigarettes, chewing tobacco, and vaping devices, such as e-cigarettes. If you need help quitting, ask your health care provider. Do not use drugs. Keep all follow-up visits. This is important. Where to find more information American Stroke Association: www.stroke.org Get help right away if: You have chest pain or an irregular heartbeat. You have any symptoms of a stroke.  "BE FAST" is an easy way to remember the main warning signs of a stroke. B - Balance. Signs are dizziness, sudden trouble walking, or loss of balance. E - Eyes. Signs are trouble seeing or a sudden change in vision. F - Face. Signs are sudden weakness or numbness of the face, or the face or eyelid drooping on one side. A - Arms. Signs are weakness or numbness in an arm. This happens suddenly and usually on one side of the body. S - Speech. Signs are sudden trouble speaking, slurred speech, or trouble understanding what people say. T - Time. Time to call emergency services. Write down what time symptoms started. You have other signs of a stroke, such as: A sudden, severe headache with no known cause. Nausea or vomiting. Seizure. These symptoms may represent a serious problem that is an emergency. Do not wait to see if the symptoms will go away. Get medical help right away. Call your local emergency services (911 in the [place] Do not drive yourself to the hospital. Summary A transient ischemic attack (TIA) happens when an artery in the head or neck is blocked. The blockage clears before there is any permanent brain damage. A TIA is a medical emergency. Symptoms of a TIA are the same as those of a stroke. The symptoms develop suddenly, and then go away quickly. Having a TIA means that you are at higher risk for a stroke. The goal of treatment is to reduce your risk for a stroke. Treatment may include medicines to thin the blood and changes to diet and lifestyle. This information is not intended to replace advice given to you by your health care provider. Make sure you discuss any questions you have with your health care provider. Document Revised: 12/17/2019 Document Reviewed: 12/17/2019 Elsevier Patient Education  [employer].  Sleep Apnea Sleep apnea is a condition in which breathing pauses or becomes shallow during sleep. People with sleep apnea usually snore loudly. They may have times  when they gasp and stop breathing for 10 seconds or more during sleep. This may happen many times during the night. Sleep apnea disrupts your sleep and keeps your body from getting the rest that it needs. This condition can increase your risk of certain health problems, including: Heart attack. Stroke. Obesity. Type 2 diabetes. Heart failure. Irregular heartbeat. High blood pressure. The goal of treatment is to help you breathe normally again. What are the causes? The most common cause of sleep apnea is a collapsed or blocked airway. There are three kinds of sleep apnea: Obstructive sleep apnea. This kind is caused by a blocked or collapsed airway. Central sleep apnea. This kind happens when the part of the brain that controls breathing does not send the correct signals to the muscles that control breathing. Mixed sleep apnea. This is a combination of obstructive and central sleep apnea. What increases the risk? You are more likely to develop this condition if you: Are overweight. Smoke. Have a smaller than normal airway. Are older. Are male. Drink alcohol. Take sedatives or tranquilizers. Have a  family history of sleep apnea. Have a tongue or tonsils that are larger than normal. What are the signs or symptoms? Symptoms of this condition include: Trouble staying asleep. Loud snoring. Morning headaches. Waking up gasping. Dry mouth or sore throat in the morning. Daytime sleepiness and tiredness. If you have daytime fatigue because of sleep apnea, you may be more likely to have: Trouble concentrating. Forgetfulness. Irritability or mood swings. Personality changes. Feelings of depression. Sexual dysfunction. This may include loss of interest if you are male, or erectile dysfunction if you are male. How is this diagnosed? This condition may be diagnosed with: A medical history. A physical exam. A series of tests that are done while you are sleeping (sleep study). These  tests are usually done in a sleep lab, but they may also be done at home. How is this treated? Treatment for this condition aims to restore normal breathing and to ease symptoms during sleep. It may involve managing health issues that can affect breathing, such as high blood pressure or obesity. Treatment may include: Sleeping on your side. Using a decongestant if you have nasal congestion. Avoiding the use of depressants, including alcohol, sedatives, and narcotics. Losing weight if you are overweight. Making changes to your diet. Quitting smoking. Using a device to open your airway while you sleep, such as: An oral appliance. This is a custom-made mouthpiece that shifts your lower jaw forward. A continuous positive airway pressure (CPAP) device. This device blows air through a mask when you breathe out (exhale). A nasal expiratory positive airway pressure (EPAP) device. This device has valves that you put into each nostril. A bi-level positive airway pressure (BPAP) device. This device blows air through a mask when you breathe in (inhale) and breathe out (exhale). Having surgery if other treatments do not work. During surgery, excess tissue is removed to create a wider airway. Follow these instructions at home: Lifestyle Make any lifestyle changes that your health care provider recommends. Eat a healthy, well-balanced diet. Take steps to lose weight if you are overweight. Avoid using depressants, including alcohol, sedatives, and narcotics. Do not use any products that contain nicotine or tobacco. These products include cigarettes, chewing tobacco, and vaping devices, such as e-cigarettes. If you need help quitting, ask your health care provider. General instructions Take over-the-counter and prescription medicines only as told by your health care provider. If you were given a device to open your airway while you sleep, use it only as told by your health care provider. If you are having  surgery, make sure to tell your health care provider you have sleep apnea. You may need to bring your device with you. Keep all follow-up visits. This is important. Contact a health care provider if: The device that you received to open your airway during sleep is uncomfortable or does not seem to be working. Your symptoms do not improve. Your symptoms get worse. Get help right away if: You develop: Chest pain. Shortness of breath. Discomfort in your back, arms, or stomach. You have: Trouble speaking. Weakness on one side of your body. Drooping in your face. These symptoms may represent a serious problem that is an emergency. Do not wait to see if the symptoms will go away. Get medical help right away. Call your local emergency services (911 in the [place] Do not drive yourself to the hospital. Summary Sleep apnea is a condition in which breathing pauses or becomes shallow during sleep. The most common cause is a collapsed or blocked airway.  The goal of treatment is to restore normal breathing and to ease symptoms during sleep. This information is not intended to replace advice given to you by your health care provider. Make sure you discuss any questions you have with your health care provider. Document Revised: 05/01/2020 Document Reviewed: 05/01/2020 Elsevier Patient Education  2022 [place].  Rizatriptan Disintegrating Tablets What is this medication? RIZATRIPTAN (rye za TRIP tan) treats migraines. It works by blocking pain signals and narrowing blood vessels in the brain. It belongs to a group of medications called triptans. It is not used to prevent migraines. This medicine may be used for other purposes; ask your health care provider or pharmacist if you have questions. COMMON BRAND NAME(S): Maxalt-MLT What should I tell my care team before I take this medication? They need to know if you have any of these conditions: Cigarette smoker Circulation problems in fingers and  toes Diabetes Heart disease High blood pressure High cholesterol History of irregular heartbeat History of stroke Kidney disease Liver disease Stomach or intestine problems An unusual or allergic reaction to rizatriptan, other medications, foods, dyes, or preservatives Pregnant or trying to get pregnant Breast-feeding How should I use this medication? Take this medication by mouth. Follow the directions on the prescription label. Leave the tablet in the sealed blister pack until you are ready to take it. With dry hands, open the blister and gently remove the tablet. If the tablet breaks or crumbles, throw it away and take a new tablet out of the blister pack. Place the tablet in the mouth and allow it to dissolve, and then swallow. Do not cut, crush, or chew this medication. You do not need water to take this medication. Do not take it more often than directed. Talk to your care team regarding the use of this medication in children. While this medication may be prescribed for children as young as 6 years for selected conditions, precautions do apply. Overdosage: If you think you have taken too much of this medicine contact a poison control center or emergency room at once. NOTE: This medicine is only for you. Do not share this medicine with others. What if I miss a dose? This does not apply. This medication is not for regular use. What may interact with this medication? Do not take this medication with any of the following medications: Certain medications for migraine headache like almotriptan, eletriptan, frovatriptan, naratriptan, rizatriptan, sumatriptan, zolmitriptan Ergot alkaloids like dihydroergotamine, ergonovine, ergotamine, methylergonovine MAOIs like Carbex, Eldepryl, Marplan, Nardil, and Parnate This medication may also interact with the following medications: Certain medications for depression, anxiety, or psychotic disorders Propranolol This list may not describe all  possible interactions. Give your health care provider a list of all the medicines, herbs, non-prescription drugs, or dietary supplements you use. Also tell them if you smoke, drink alcohol, or use illegal drugs. Some items may interact with your medicine. What should I watch for while using this medication? Visit your care team for regular checks on your progress. Tell your care team if your symptoms do not start to get better or if they get worse. You may get drowsy or dizzy. Do not drive, use machinery, or do anything that needs mental alertness until you know how this medication affects you. Do not stand up or sit up quickly, especially if you are an older patient. This reduces the risk of dizzy or fainting spells. Alcohol may interfere with the effect of this medication. Your mouth may get dry. Chewing sugarless gum  or sucking hard candy and drinking plenty of water may help. Contact your care team if the problem does not go away or is severe. If you take migraine medications for 10 or more days a month, your migraines may get worse. Keep a diary of headache days and medication use. Contact your care team if your migraine attacks occur more frequently. What side effects may I notice from receiving this medication? Side effects that you should report to your care team as soon as possible: Allergic reactions-skin rash, itching, hives, swelling of the face, lips, tongue, or throat Burning, pain, tingling, or color changes in the legs or feet Heart attack-pain or tightness in the chest, shoulders, arms, or jaw, nausea, shortness of breath, cold or clammy skin, feeling faint or lightheaded Heart rhythm changes-fast or irregular heartbeat, dizziness, feeling faint or lightheaded, chest pain, trouble breathing Increase in blood pressure Irritability, confusion, fast or irregular heartbeat, muscle stiffness, twitching muscles, sweating, high fever, seizure, chills, vomiting, diarrhea, which may be signs of  serotonin syndrome Raynaud's-cool, numb, or painful fingers or toes that may change color from pale, to blue, to red Seizures Stroke-sudden numbness or weakness of the face, arm, or leg, trouble speaking, confusion, trouble walking, loss of balance or coordination, dizziness, severe headache, change in vision Sudden or severe stomach pain, nausea, vomiting, fever, or bloody diarrhea Vision loss Side effects that usually do not require medical attention (report to your care team if they continue or are bothersome): Dizziness General discomfort or fatigue This list may not describe all possible side effects. Call your doctor for medical advice about side effects. You may report side effects to FDA at 1-800-FDA-1088. Where should I keep my medication? Keep out of the reach of children and pets. Store at room temperature between 15 and 30 degrees C (59 and 86 degrees F). Protect from light and moisture. Throw away any unused medication after the expiration date. NOTE: This sheet is a summary. It may not cover all possible information. If you have questions about this medicine, talk to your doctor, pharmacist, or health care provider.  2022 Elsevier/Gold Standard (2020-06-17 16:19:19) Topiramate Tablets What is this medication? TOPIRAMATE (toe PYRE a mate) prevents and controls seizures in people with epilepsy. It may also be used to prevent migraine headaches. It works by calming overactive nerves in your body. This medicine may be used for other purposes; ask your health care provider or pharmacist if you have questions. COMMON BRAND NAME(S): Topamax, Topiragen What should I tell my care team before I take this medication? They need to know if you have any of these conditions: Bleeding disorder Kidney disease Lung disease Suicidal thoughts, plans or attempt An unusual or allergic reaction to topiramate, other medications, foods, dyes, or preservatives Pregnant or trying to get  pregnant Breast-feeding How should I use this medication? Take this medication by mouth with water. Take it as directed on the prescription label at the same time every day. Do not cut, crush or chew this medicine. Swallow the tablets whole. You can take it with or without food. If it upsets your stomach, take it with food. Keep taking it unless your care team tells you to stop. A special MedGuide will be given to you by the pharmacist with each prescription and refill. Be sure to read this information carefully each time. Talk to your care team about the use of this medication in children. While it may be prescribed for children as young as 2 years for selected  conditions, precautions do apply. Overdosage: If you think you have taken too much of this medicine contact a poison control center or emergency room at once. NOTE: This medicine is only for you. Do not share this medicine with others. What if I miss a dose? If you miss a dose, take it as soon as you can unless it is within 6 hours of the next dose. If it is within 6 hours of the next dose, skip the missed dose. Take the next dose at the normal time. Do not take double or extra doses. What may interact with this medication? Acetazolamide Alcohol Antihistamines for allergy, cough, and cold Aspirin and aspirin-like medications Atropine Birth control pills Certain medications for anxiety or sleep Certain medications for bladder problems like oxybutynin, tolterodine Certain medications for depression like amitriptyline, fluoxetine, sertraline Certain medications for Parkinson's disease like benztropine, trihexyphenidyl Certain medications for seizures like carbamazepine, lamotrigine, phenobarbital, phenytoin, primidone, valproic acid, zonisamide Certain medications for stomach problems like dicyclomine, hyoscyamine Certain medications for travel sickness like scopolamine Certain medications that treat or prevent blood clots like warfarin,  enoxaparin, dalteparin, apixaban, dabigatran, and rivaroxaban Digoxin Diltiazem General anesthetics like halothane, isoflurane, methoxyflurane, propofol Glyburide Hydrochlorothiazide Ipratropium Lithium Medications that relax muscles Metformin Narcotic medications for pain NSAIDs, medications for pain and inflammation, like ibuprofen or naproxen Phenothiazines like chlorpromazine, mesoridazine, prochlorperazine, thioridazine Pioglitazone This list may not describe all possible interactions. Give your health care provider a list of all the medicines, herbs, non-prescription drugs, or dietary supplements you use. Also tell them if you smoke, drink alcohol, or use illegal drugs. Some items may interact with your medicine. What should I watch for while using this medication? Visit your care team for regular checks on your progress. Tell your care team if your symptoms do not start to get better or if they get worse. Do not suddenly stop taking this medication. You may develop a severe reaction. Your care team will tell you how much medication to take. If your care team wants you to stop the medication, the dose may be slowly lowered over time to avoid any side effects. Wear a medical ID bracelet or chain. Carry a card that describes your condition. List the medications and doses you take on the card. You may get drowsy or dizzy. Do not drive, use machinery, or do anything that needs mental alertness until you know how this medication affects you. Do not stand up or sit up quickly, especially if you are an older patient. This reduces the risk of dizzy or fainting spells. Alcohol may interfere with the effects of this medication. Avoid alcoholic drinks. This medication may cause serious skin reactions. They can happen weeks to months after starting the medication. Contact your care team right away if you notice fevers or flu-like symptoms with a rash. The rash may be red or purple and then turn into  blisters or peeling of the skin. Or, you might notice a red rash with swelling of the face, lips or lymph nodes in your neck or under your arms. Watch for new or worsening thoughts of suicide or depression. This includes sudden changes in mood, behaviors, or thoughts. These changes can happen at any time but are more common in the beginning of treatment or after a change in dose. Call your care team right away if you experience these thoughts or worsening depression. This medication may slow your child's growth if it is taken for a long time at high doses. Your care team will  monitor your child's growth. Using this medication for a long time may weaken your bones. The risk of bone fractures may be increased. Talk to your care team about your bone health. Do not become pregnant while taking this medication. Hormone forms of birth control may not work as well with this medication. Talk to your care team about other forms of birth control. There is potential for serious harm to an unborn child. Tell your care team right away if you think you might be pregnant. What side effects may I notice from receiving this medication? Side effects that you should report to your care team as soon as possible: Allergic reactions-skin rash, itching, hives, swelling of the face, lips, tongue, or throat High acid level-trouble breathing, unusual weakness or fatigue, confusion, headache, fast or irregular heartbeat, nausea, vomiting High ammonia level-unusual weakness or fatigue, confusion, loss of appetite, nausea, vomiting, seizures Fever that does not go away, decrease in sweat Kidney stones-blood in the urine, pain or trouble passing urine, pain in the lower back or sides Redness, blistering, peeling or loosening of the skin, including inside the mouth Sudden eye pain or change in vision such as blurry vision, seeing halos around lights, vision loss Thoughts of suicide or self-harm, worsening mood, feelings of  depression Side effects that usually do not require medical attention (report to your care team if they continue or are bothersome): Burning or tingling sensation in hands or feet Difficulty with paying attention, memory, or speech Dizziness Drowsiness Fatigue Loss of appetite with weight loss Slow or sluggish movements of the body This list may not describe all possible side effects. Call your doctor for medical advice about side effects. You may report side effects to FDA at 1-800-FDA-1088. Where should I keep my medication? Keep out of the reach of children and pets. Store between 15 and 30 degrees C (59 and 86 degrees F). Protect from moisture. Keep the container tightly closed. Get rid of any unused medication after the expiration date. To get rid of medications that are no longer needed or have expired: Take the medication to a medication take-back program. Check with your pharmacy or law enforcement to find a location. If you cannot return the medication, check the label or package insert to see if the medication should be thrown out in the garbage or flushed down the toilet. If you are not sure, ask your care team. If it is safe to put it in the trash, empty the medication out of the container. Mix the medication with cat litter, dirt, coffee grounds, or other unwanted substance. Seal the mixture in a bag or container. Put it in the trash. NOTE: This sheet is a summary. It may not cover all possible information. If you have questions about this medicine, talk to your doctor, pharmacist, or health care provider.  2022 Elsevier/Gold Standard (2020-07-14 12:37:42)

## 2021-02-21 ENCOUNTER — Encounter: Payer: Self-pay | Admitting: Neurology

## 2021-03-11 ENCOUNTER — Ambulatory Visit
Admission: RE | Admit: 2021-03-11 | Discharge: 2021-03-11 | Disposition: A | Discharge status: Home / Self Care | Payer: 59 | Source: Ambulatory Visit | Attending: Neurology | Admitting: Neurology

## 2021-03-11 DIAGNOSIS — R51 Headache with orthostatic component, not elsewhere classified: Secondary | ICD-10-CM

## 2021-03-11 DIAGNOSIS — R4701 Aphasia: Secondary | ICD-10-CM

## 2021-03-11 DIAGNOSIS — R519 Headache, unspecified: Secondary | ICD-10-CM

## 2021-03-11 DIAGNOSIS — R42 Dizziness and giddiness: Secondary | ICD-10-CM | POA: Diagnosis not present

## 2021-03-11 DIAGNOSIS — R200 Anesthesia of skin: Secondary | ICD-10-CM

## 2021-03-11 MED ORDER — GADOBENATE DIMEGLUMINE 529 MG/ML IV SOLN
18.0000 mL | Freq: Once | INTRAVENOUS | Status: AC | PRN
  Administered 2021-03-11: 18 mL via INTRAVENOUS

## 2021-03-12 NOTE — Telephone Encounter (Signed)
   Pt ia calling back to speak with RN again

## 2021-03-12 NOTE — Telephone Encounter (Addendum)
Called pt in regards to Chest pain sent through my chart.  He reports pain first started about 2 weeks again.  Chest pain is sharp to the left side of his chest, varies in severity and last any where from seconds to a couple of minutes.  CP does not radiate to left shoulder or arm.  Pain occurs at random times he expresses that CP can occur while sitting down watching tv or while he is up moving around.  He expresses that he is scared that something may happen to him.  I asked about recent BP however he had to get off the phone to take his son to school.  He will call back later.  Called pt back he reports BP today is 142/87-52.  He expresses that he monitors the amount of sodium he consumes. 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 Will route to MD to advise.

## 2021-03-17 ENCOUNTER — Ambulatory Visit (INDEPENDENT_AMBULATORY_CARE_PROVIDER_SITE_OTHER): Payer: 59 | Admitting: Internal Medicine

## 2021-03-17 ENCOUNTER — Other Ambulatory Visit: Payer: Self-pay

## 2021-03-17 ENCOUNTER — Encounter: Payer: Self-pay | Admitting: Internal Medicine

## 2021-03-17 VITALS — BP 136/82 | HR 52 | Ht 66.0 in | Wt 202.4 lb

## 2021-03-17 DIAGNOSIS — Q2381 Bicuspid aortic valve: Secondary | ICD-10-CM

## 2021-03-17 DIAGNOSIS — I7121 Aneurysm of the ascending aorta, without rupture: Secondary | ICD-10-CM

## 2021-03-17 DIAGNOSIS — R079 Chest pain, unspecified: Secondary | ICD-10-CM | POA: Diagnosis not present

## 2021-03-17 DIAGNOSIS — I350 Nonrheumatic aortic (valve) stenosis: Secondary | ICD-10-CM | POA: Diagnosis not present

## 2021-03-17 DIAGNOSIS — Q231 Congenital insufficiency of aortic valve: Secondary | ICD-10-CM | POA: Diagnosis not present

## 2021-03-17 DIAGNOSIS — Z8249 Family history of ischemic heart disease and other diseases of the circulatory system: Secondary | ICD-10-CM

## 2021-03-17 NOTE — Patient Instructions (Signed)
Medication Instructions:  Your physician recommends that you continue on your current medications as directed. Please refer to the Current Medication list given to you today.  *If you need a refill on your cardiac medications before your next appointment, please call your pharmacy*   Lab Work: NONE If you have labs (blood work) drawn today and your tests are completely normal, you will receive your results only by: [place] (if you have MyChart) OR A paper copy in the mail If you have any lab test that is abnormal or we need to change your treatment, we will call you to review the results.   Testing/Procedures: NONE   Follow-Up: At [facility], you and your health needs are our priority.  As part of our continuing mission to provide you with exceptional heart care, we have created designated Provider Care Teams.  These Care Teams include your primary Cardiologist (physician) and Advanced Practice Providers (APPs -  Physician Assistants and Nurse Practitioners) who all work together to provide you with the care you need, when you need it.

## 2021-03-17 NOTE — Progress Notes (Addendum)
Cardiology Office Note:    Date:  03/17/2021   ID:  Johnathan Anderson, DOB 20-Jun-1965, MRN [id number]  PCP:  Lavone Orn, [place]  Cardiologist:  Werner Lean, MD  Advanced Practice Provider:  No care team member to display Electrophysiologist:  None   CC: Urgent for CP   History of Present Illness:    Johnathan Anderson is a 55 y.o. male with a hx of recent echocardiogram 08/14/20 who presents for evaluation 10/04/20.  In interim of this visit, patient had TEE and Cardiac CT- minimal CAC, Bicuspid Aortic valve with moderate aortic aneurysm. Seen 03/17/21.  Previously had rare chest pinch- spontaneously and randomly, that last 0.5 s.  Not associated with exertion. 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  Notes his chest discomfort is slightly different from prior:  more consistent and with left sided numbness.  Also had facial numbing and headache is worked up for migraine.  No exertional component and no radiation. No SOB, DOE, syncope or near syncope.   Past Surgical History:  Procedure Laterality Date   APPENDECTOMY     EYE SURGERY     STERIOD INJECTION  09/16/2011   Procedure: MINOR STEROID INJECTION;  Surgeon: Lowella Grip, MD;  Location: [place];  Service: Orthopedics;  Laterality: Left;  Left Lumbar Five-Sacral One Transforaminal Epidural Steroid Injection   TEE WITHOUT CARDIOVERSION N/A 11/05/2020   Procedure: TRANSESOPHAGEAL ECHOCARDIOGRAM (TEE);  Surgeon: Werner Lean, MD;  Location: [facility] ENDOSCOPY;  Service: Cardiovascular;  Laterality: N/A;    Current Medications: Current Meds  Medication Sig   diclofenac Sodium (VOLTAREN) 1 % GEL Apply 1 application topically in the morning, at noon, and at bedtime.   famotidine (PEPCID) 40 MG tablet Take 40 mg by mouth at bedtime as needed.   rizatriptan (MAXALT-MLT) 10 MG disintegrating tablet Take 1 tablet (10 mg total) by mouth as  needed for migraine. May repeat in 2 hours if needed   topiramate (TOPAMAX) 50 MG tablet Take 1 tablet (50 mg total) by mouth at bedtime.     Allergies:   Levaquin [levofloxacin]   Social History   Socioeconomic History   Marital status: Married    Spouse name: Not on file   Number of children: Not on file   Years of education: Not on file   Highest education level: Not on file  Occupational History   Not on file  Tobacco Use   Smoking status: Former   Smokeless tobacco: Never  Substance and Sexual Activity   Alcohol use: No   Drug use: No   Sexual activity: Not on file  Other Topics Concern   Not on file  Social History Narrative   Not on file   Social Determinants of Health   Financial Resource Strain: Not on file  Food Insecurity: Not on file  Transportation Needs: Not on file  Physical Activity: Not on file  Stress: Not on file  Social Connections: Not on file    Social: Comes with his wife; has many brothers and four sons; originally from [place]  Family History: The patient's family history includes Diabetes in his father; Hypertension in his mother. There is no history of Migraines. History of coronary artery disease notable possibly brother. History of heart failure notable for no members. History of arrhythmia notable for no members Family history of sudden cardiac death: Brother was totally fine, had an argument with his daughter, and died suddenly.  No history of bicuspid aortic valve or aortic aneurysm. No history of cardiomyopathies including hypertrophic cardiomyopathy, left ventricular non-compaction, or arrhythmogenic right ventricular cardiomyopathy.   ROS:   Please see the history of present illness.     All other systems reviewed and are negative.  EKGs/Labs/Other Studies Reviewed:    The following studies were reviewed today:  EKG:  EKG is  ordered today.  The ekg ordered today demonstrates  03/17/21: Sinus bradycardia 52 LVH 10/05/20: Sinus  bradycardia borderline LVH 08/19/20 OSH;  SR rate 60 borderline LVH  Transthoracic Echocardiogram: Date: 08/14/20 Results: AA 44 mm, Bicuspid calcific valve, cannot r/o calcification vs lesion on LVOT side, no evidence of Coarctation; LVSVi 33; DVI 0.28, Acceleration time < 100 ms  1. Left ventricular ejection fraction, by estimation, is 60 to 65%. The  left ventricle has normal function. The left ventricle has no regional  wall motion abnormalities. Left ventricular diastolic parameters were  normal.   2. Right ventricular systolic function is normal. The right ventricular  size is normal. There is normal pulmonary artery systolic pressure.   3. The mitral valve is normal in structure. No evidence of mitral valve  regurgitation. No evidence of mitral stenosis.   4. Mass noted attached to rt coronary cusp of the aortic valve measuring  ~7 mm. Differential diagnosis should include endocarditis, fibroelastome,  thrombus. TEE is recommended.. The aortic valve is bicuspid. There is mild  calcification of the aortic  valve. There is mild thickening of the aortic valve. Aortic valve  regurgitation is mild. Moderate aortic valve stenosis.   5. Aneurysm of the ascending aorta, measuring 44 mm.   6. The inferior vena cava is normal in size with greater than 50%  respiratory variability, suggesting right atrial pressure of 3 mmHg.   Transesophageal Echocardiogram: Date: 11/05/20 Results: 1. The aortic valve is bicuspid. There is moderate calcification of the  aortic valve. There is mild thickening of the aortic valve. Aortic valve  regurgitation is trivial. AVA by 3D planimetry 1.90 cm2.   2. Aortic dilatation noted. There is mild dilatation of the ascending  aorta, measuring 44 mm.   3. Left ventricular ejection fraction, by estimation, is 60 to 65%. Left  ventricular ejection fraction by 2D MOD biplane is 60.3 %. The left  ventricle has normal function. The left ventricle has no regional wall   motion abnormalities.   4. Right ventricular systolic function is normal. The right ventricular  size is normal.   5. No left atrial/left atrial appendage thrombus was detected.   6. The mitral valve is grossly normal. Trivial mitral valve  regurgitation. No evidence of mitral stenosis.   7. Agitated saline contrast bubble study was negative, with no evidence  of any interatrial shunt.   Comparison(s): Compared to prior TTE, echodensity from prior study is most  consistent with calcification from bicuspid aortic valve.   Cardiac CT Date:11/30/20 Results: IMPRESSION: 1. Anatomically mild aortic stenosis of a bicuspid valve. Findings pertinent to TAVR procedure are detailed above, though at this time valve team assessment is deferred.   2. Moderate ascending aortic aneurysm without evidence of concomitant coarctation of the aorta.   3. Patient's total coronary artery calcium score is 0.366, which is 56th percentile for subjects of the same age, gender, and race based populations.    Recent Labs: 12/03/2020: ALT 37; BUN 26; Creatinine, Ser 1.28; Hemoglobin 14.6; Platelets 164; Potassium 3.5; Sodium 139  Recent Lipid Panel No results found for: CHOL, TRIG, HDL,  CHOLHDL, VLDL, LDLCALC, LDLDIRECT   Physical Exam:    VS:  BP 136/82   Pulse (!) 52   Ht 5\' 6"  (1.676 m)   Wt 202 lb 6.4 oz (91.8 kg)   SpO2 97%   BMI 32.67 kg/m     Wt Readings from Last 3 Encounters:  03/17/21 202 lb 6.4 oz (91.8 kg)  02/19/21 207 lb (93.9 kg)  12/03/20 210 lb 1.6 oz (95.3 kg)    GEN:  Well nourished, well developed in no acute distress HEENT: Normal NECK: No JVD; LYMPHATICS: No lymphadenopathy CARDIAC: RRR, III/VI systolic murmur with no rubs, gallops RESPIRATORY:  Clear to auscultation without rales, wheezing or rhonchi  ABDOMEN: Soft, non-tender, non-distended MUSCULOSKELETAL:  No edema; No deformity  SKIN: Warm and dry NEUROLOGIC:  Alert and oriented x 3 PSYCHIATRIC:  Normal affect    ASSESSMENT:    1. Chest pain of uncertain etiology   2. Aneurysm of ascending aorta without rupture   3. Moderate aortic stenosis   4. Bicuspid aortic valve   5. Family history of aortic dissection     PLAN:     Non-cardiac CP Asymptomatic thoracic aortic aneurysm Moderate Aortic Stenosis with more pronounced murmur Bicuspid Aortic Valve without Coarctation of the Aorta Possible Fhx Aortic Dissection (Brother) - this is not consistent with cardiac CP, has minimal CAC; if this persist or worsens we discussed doing a lexi-scan for further evaluation (given his AS) - Last at 45 mm - in future will have a a lower threshold to refer to surgery ( 4.5 cm with SAVR/5.0 cm without) - echo and CT in December will see in January 2023 - at next visit will repeat lipids, LDL goal < 70  Move visit to January follow up unless new symptoms or abnormal test results warranting change in plan  Would be reasonable for  Video Visit Follow up Would be reasonable for  APP Follow up     Medication Adjustments/Labs and Tests Ordered: Current medicines are reviewed at length with the patient today.  Concerns regarding medicines are outlined above.  Orders Placed This Encounter  Procedures   EKG 12-Lead    No orders of the defined types were placed in this encounter.   Patient Instructions  Medication Instructions:  Your physician recommends that you continue on your current medications as directed. Please refer to the Current Medication list given to you today.  *If you need a refill on your cardiac medications before your next appointment, please call your pharmacy*   Lab Work: NONE If you have labs (blood work) drawn today and your tests are completely normal, you will receive your results only by: [place] (if you have MyChart) OR A paper copy in the mail If you have any lab test that is abnormal or we need to change your treatment, we will call you to review the  results.   Testing/Procedures: NONE   Follow-Up: At [facility], you and your health needs are our priority.  As part of our continuing mission to provide you with exceptional heart care, we have created designated Provider Care Teams.  These Care Teams include your primary Cardiologist (physician) and Advanced Practice Providers (APPs -  Physician Assistants and Nurse Practitioners) who all work together to provide you with the care you need, when you need it.        Signed, Werner Lean, MD  03/17/2021 11:53 AM    [place]

## 2021-05-10 ENCOUNTER — Ambulatory Visit: Payer: 59 | Admitting: Internal Medicine

## 2021-05-12 ENCOUNTER — Encounter (HOSPITAL_COMMUNITY): Payer: Self-pay | Admitting: Internal Medicine

## 2021-05-12 ENCOUNTER — Encounter: Payer: Self-pay | Admitting: Cardiology

## 2021-05-12 ENCOUNTER — Other Ambulatory Visit (HOSPITAL_COMMUNITY): Payer: 59

## 2021-05-12 NOTE — Progress Notes (Unsigned)
Patient ID: Johnathan Anderson, male   DOB: 10/28/1965, 55 y.o.   MRN: [id number]  Verified appointment "no show" status with [name] at 7:45am.

## 2021-05-26 ENCOUNTER — Telehealth: Payer: Self-pay

## 2021-05-26 NOTE — Telephone Encounter (Signed)
Edited order to be completed at [facility].

## 2021-05-26 NOTE — Addendum Note (Signed)
Addended by: Precious Gilding on: 05/26/2021 11:33 AM   Modules accepted: Orders

## 2021-06-03 ENCOUNTER — Other Ambulatory Visit: Payer: Self-pay

## 2021-06-03 ENCOUNTER — Ambulatory Visit (HOSPITAL_COMMUNITY): Payer: 59 | Attending: Internal Medicine

## 2021-06-03 DIAGNOSIS — I712 Thoracic aortic aneurysm, without rupture, unspecified: Secondary | ICD-10-CM | POA: Insufficient documentation

## 2021-06-03 DIAGNOSIS — K0530 Chronic periodontitis, unspecified: Secondary | ICD-10-CM | POA: Diagnosis present

## 2021-06-03 DIAGNOSIS — Z8249 Family history of ischemic heart disease and other diseases of the circulatory system: Secondary | ICD-10-CM | POA: Insufficient documentation

## 2021-06-03 DIAGNOSIS — Q231 Congenital insufficiency of aortic valve: Secondary | ICD-10-CM | POA: Diagnosis present

## 2021-06-03 DIAGNOSIS — I350 Nonrheumatic aortic (valve) stenosis: Secondary | ICD-10-CM | POA: Diagnosis not present

## 2021-06-03 LAB — ECHOCARDIOGRAM COMPLETE
AR max vel: 0.94 cm2
AV Area VTI: 1 cm2
AV Area mean vel: 0.8 cm2
AV Mean grad: 33 mmHg
AV Peak grad: 51.8 mmHg
Ao pk vel: 3.6 m/s
Area-P 1/2: 2.96 cm2
S' Lateral: 2 cm

## 2021-06-04 ENCOUNTER — Ambulatory Visit (INDEPENDENT_AMBULATORY_CARE_PROVIDER_SITE_OTHER)
Admission: RE | Admit: 2021-06-04 | Discharge: 2021-06-04 | Disposition: A | Discharge status: Home / Self Care | Payer: 59 | Source: Ambulatory Visit | Attending: Internal Medicine | Admitting: Internal Medicine

## 2021-06-04 DIAGNOSIS — I7121 Aneurysm of the ascending aorta, without rupture: Secondary | ICD-10-CM

## 2021-06-04 DIAGNOSIS — I712 Thoracic aortic aneurysm, without rupture, unspecified: Secondary | ICD-10-CM | POA: Diagnosis not present

## 2021-06-04 MED ORDER — IOHEXOL 350 MG/ML SOLN
100.0000 mL | Freq: Once | INTRAVENOUS | Status: AC | PRN
  Administered 2021-06-04: 100 mL via INTRAVENOUS

## 2021-06-13 NOTE — Progress Notes (Signed)
Cardiology Office Note:    Date:  06/14/2021   ID:  Johnathan Anderson, DOB [birth year], MRN [id number]  PCP:  Lavone Orn, [place]  Cardiologist:  Werner Lean, MD  Advanced Practice Provider:  No care team member to display Electrophysiologist:  None   CC: Follow up AS and TAA  History of Present Illness:    Johnathan Anderson is a 56 y.o. male with a hx of recent echocardiogram 08/14/20 who presents for evaluation 10/04/20.  In interim of this visit, patient had TEE and Cardiac CT- minimal CAC, Bicuspid Aortic valve with moderate aortic aneurysm. Has 2022 imaging with stable valve disease and aortic size.  Seen 06/14/21.  Patient notes that he is doing feeling rare palpitations.  No pain or pressure .  Notes some left arm pain. Since day prior/last visit notes had his echo and CT- things have been stable.  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  Has a goal of walking 2 miles a day. There are no interval hospital/ED visit.    No chest pain or pressure.  No SOB/DOE and no PND/Orthopnea.  No weight gain or leg swelling.  No palpitations or syncope.  Has started walking and is doing ok.     Past Surgical History:  Procedure Laterality Date   APPENDECTOMY     EYE SURGERY     STERIOD INJECTION  09/16/2011   Procedure: MINOR STEROID INJECTION;  Surgeon: Lowella Grip, MD;  Location: [place];  Service: Orthopedics;  Laterality: Left;  Left Lumbar Five-Sacral One Transforaminal Epidural Steroid Injection   TEE WITHOUT CARDIOVERSION N/A 11/05/2020   Procedure: TRANSESOPHAGEAL ECHOCARDIOGRAM (TEE);  Surgeon: Werner Lean, MD;  Location: [facility];  Service: Cardiovascular;  Laterality: N/A;    Current Medications: Current Meds  Medication Sig   amoxicillin (AMOXIL) 500 MG capsule Take 1,000 mg by mouth 2 (two) times daily.   clarithromycin (BIAXIN) 500 MG tablet Take 500 mg by mouth 2 (two)  times daily.   diclofenac Sodium (VOLTAREN) 1 % GEL Apply 1 application topically in the morning, at noon, and at bedtime.   fluconazole (DIFLUCAN) 100 MG tablet Take 100 mg by mouth daily.   omeprazole (PRILOSEC) 20 MG capsule Take 20 mg by mouth every morning.     Allergies:   Levaquin [levofloxacin]   Social History   Socioeconomic History   Marital status: Married    Spouse name: Not on file   Number of children: Not on file   Years of education: Not on file   Highest education level: Not on file  Occupational History   Not on file  Tobacco Use   Smoking status: Former   Smokeless tobacco: Never  Substance and Sexual Activity   Alcohol use: No   Drug use: No   Sexual activity: Not on file  Other Topics Concern   Not on file  Social History Narrative   Not on file   Social Determinants of Health   Financial Resource Strain: Not on file  Food Insecurity: Not on file  Transportation Needs: Not on file  Physical Activity: Not on file  Stress: Not on file  Social Connections: Not on file    Social: Comes with his wife; has many brothers and four sons; originally from [place] Works in Environmental consultant.  Family History: The patient's family history includes Diabetes in his father; Hypertension in his mother. There is no  history of Migraines. History of coronary artery disease notable possibly brother. History of heart failure notable for no members. History of arrhythmia notable for no members Family history of sudden cardiac death: Brother was totally fine, had an argument with his daughter, and died suddenly. No history of bicuspid aortic valve or aortic aneurysm. No history of cardiomyopathies including hypertrophic cardiomyopathy, left ventricular non-compaction, or arrhythmogenic right ventricular cardiomyopathy.   ROS:   Please see the history of present illness.     All other systems reviewed and are negative.  EKGs/Labs/Other Studies  Reviewed:    The following studies were reviewed today:  EKG:   03/17/21: Sinus bradycardia 52 LVH 10/05/20: Sinus bradycardia borderline LVH 08/19/20 OSH;  SR rate 60 borderline LVH  Transthoracic Echocardiogram: Date: 06/03/21 Results:   1. Bicuspid aortic valve with fusion of the RCC/LCC. Vmax 3.6 m/s, MG 33  mmHG, AVA 1.0 cm2, DI 0.26. Moderate aortic stenosis. The aortic valve is  bicuspid. Aortic valve regurgitation is trivial. Moderate aortic valve  stenosis.   2. Aneurysm of the ascending aorta, measuring 45 mm.   3. Left ventricular ejection fraction, by estimation, is 60 to 65%. The  left ventricle has normal function. The left ventricle has no regional  wall motion abnormalities. Left ventricular diastolic parameters were  normal.   4. Right ventricular systolic function is normal. The right ventricular  size is normal. Tricuspid regurgitation signal is inadequate for assessing  PA pressure.   5. The mitral valve is grossly normal. Trivial mitral valve  regurgitation. No evidence of mitral stenosis.   6. The inferior vena cava is normal in size with greater than 50%  respiratory variability, suggesting right atrial pressure of 3 mmHg.   Comparison(s): No significant change from prior study. AS remains  moderate. Stable Asc Ao.   Transesophageal Echocardiogram: Date: 11/05/20 Results: 1. The aortic valve is bicuspid. There is moderate calcification of the  aortic valve. There is mild thickening of the aortic valve. Aortic valve  regurgitation is trivial. AVA by 3D planimetry 1.90 cm2.   2. Aortic dilatation noted. There is mild dilatation of the ascending  aorta, measuring 44 mm.   3. Left ventricular ejection fraction, by estimation, is 60 to 65%. Left  ventricular ejection fraction by 2D MOD biplane is 60.3 %. The left  ventricle has normal function. The left ventricle has no regional wall  motion abnormalities.   4. Right ventricular systolic function is normal.  The right ventricular  size is normal.   5. No left atrial/left atrial appendage thrombus was detected.   6. The mitral valve is grossly normal. Trivial mitral valve  regurgitation. No evidence of mitral stenosis.   7. Agitated saline contrast bubble study was negative, with no evidence  of any interatrial shunt.   Comparison(s): Compared to prior TTE, echodensity from prior study is most  consistent with calcification from bicuspid aortic valve.   Cardiac CT Date:11/30/20 Results: IMPRESSION: IMPRESSION: Stable aneurysmal disease of the ascending thoracic aorta measuring approximately 4.4 cm in maximum diameter by CTA today.   Recommend annual imaging followup by CTA or MRA. This recommendation follows 2010 ACCF/AHA/AATS/ACR/ASA/SCA/SCAI/SIR/STS/SVM Guidelines for the Diagnosis and Management of Patients with Thoracic Aortic Disease. Circulation. 2010; 121: F818-E993. Aortic aneurysm NOS (ICD10-I71.9)   Aortic aneurysm NOS (ICD10-I71.9).    Recent Labs: 12/03/2020: ALT 37; BUN 26; Creatinine, Ser 1.28; Hemoglobin 14.6; Platelets 164; Potassium 3.5; Sodium 139  Recent Lipid Panel No results found for: CHOL, TRIG, HDL, CHOLHDL, VLDL, LDLCALC,  LDLDIRECT   Physical Exam:    VS:  BP 132/90    Pulse 67    Ht 5\' 6"  (1.676 m)    Wt 90.7 kg    SpO2 97%    BMI 32.28 kg/m     Wt Readings from Last 3 Encounters:  06/14/21 90.7 kg  03/17/21 91.8 kg  02/19/21 93.9 kg    GEN:  Well nourished, well developed in no acute distress HEENT: Normal NECK: No JVD; LYMPHATICS: No lymphadenopathy CARDIAC: RRR, III/VI systolic murmur with no rubs, gallops RESPIRATORY:  Clear to auscultation without rales, wheezing or rhonchi  ABDOMEN: Soft, non-tender, non-distended MUSCULOSKELETAL:  No edema; No deformity  SKIN: Warm and dry NEUROLOGIC:  Alert and oriented x 3 PSYCHIATRIC:  Normal affect   ASSESSMENT:    1. Aneurysm of ascending aorta without rupture   2. Moderate aortic stenosis    3. Bicuspid aortic valve   4. Family history of aortic dissection      PLAN:    Asymptomatic thoracic aortic aneurysm Moderate Aortic Stenosis  Bicuspid Aortic Valve without Coarctation of the Aorta Possible Fhx Aortic Dissection (Brother) Elevated blood pressure reading - Last at 45 mm, DVI is 0.26 but gradient in 26 mmHg - echo and CT in December will see in January 2024 -  will repeat lipids, LDL goal < 70 - will start amb BP monitoring, if above 135/80 persistently will start norvasc 2.5 mg PO Daily - Echo CT and one year  One year f/u with Me   Medication Adjustments/Labs and Tests Ordered: Current medicines are reviewed at length with the patient today.  Concerns regarding medicines are outlined above.  Orders Placed This Encounter  Procedures   CT ANGIO CHEST AORTA W/CM & OR WO/CM   Lipid panel   ECHOCARDIOGRAM COMPLETE    No orders of the defined types were placed in this encounter.    Patient Instructions  Medication Instructions:  Your physician recommends that you continue on your current medications as directed. Please refer to the Current Medication list given to you today.  *If you need a refill on your cardiac medications before your next appointment, please call your pharmacy*   Lab Work: TODAY: Fasting lipid panel If you have labs (blood work) drawn today and your tests are completely normal, you will receive your results only by: [place] (if you have MyChart) OR A paper copy in the mail If you have any lab test that is abnormal or we need to change your treatment, we will call you to review the results.   Testing/Procedures: Your physician has requested that you have an echocardiogram in December 2023. Echocardiography is a painless test that uses sound waves to create images of your heart. It provides your doctor with information about the size and shape of your heart and how well your hearts chambers and valves are working. This  procedure takes approximately one hour. There are no restrictions for this procedure.  Your physician has requested that you have a CT Aorta in December 2023.     Follow-Up: At [facility], you and your health needs are our priority.  As part of our continuing mission to provide you with exceptional heart care, we have created designated Provider Care Teams.  These Care Teams include your primary Cardiologist (physician) and Advanced Practice Providers (APPs -  Physician Assistants and Nurse Practitioners) who all work together to provide you with the care you need, when you need it.  Your next appointment:  1 year(s)  The format for your next appointment:   In Person  Provider:   Werner Lean, MD     Other Instructions Please perform Blood pressure checks as discussed with Dr. Gasper Sells.     Signed, Werner Lean, MD  06/14/2021 10:09 AM    [place]

## 2021-06-14 ENCOUNTER — Encounter: Payer: Self-pay | Admitting: Internal Medicine

## 2021-06-14 ENCOUNTER — Other Ambulatory Visit: Payer: Self-pay

## 2021-06-14 ENCOUNTER — Ambulatory Visit (INDEPENDENT_AMBULATORY_CARE_PROVIDER_SITE_OTHER): Payer: Managed Care, Other (non HMO) | Admitting: Internal Medicine

## 2021-06-14 VITALS — BP 132/90 | HR 67 | Ht 66.0 in | Wt 200.0 lb

## 2021-06-14 DIAGNOSIS — I7121 Aneurysm of the ascending aorta, without rupture: Secondary | ICD-10-CM

## 2021-06-14 DIAGNOSIS — Q231 Congenital insufficiency of aortic valve: Secondary | ICD-10-CM | POA: Diagnosis not present

## 2021-06-14 DIAGNOSIS — Z8249 Family history of ischemic heart disease and other diseases of the circulatory system: Secondary | ICD-10-CM

## 2021-06-14 DIAGNOSIS — I350 Nonrheumatic aortic (valve) stenosis: Secondary | ICD-10-CM

## 2021-06-14 LAB — LIPID PANEL
Chol/HDL Ratio: 4.1 ratio (ref 0.0–5.0)
Cholesterol, Total: 174 mg/dL (ref 100–199)
HDL: 42 mg/dL
LDL Chol Calc (NIH): 112 mg/dL — ABNORMAL HIGH (ref 0–99)
Triglycerides: 109 mg/dL (ref 0–149)
VLDL Cholesterol Cal: 20 mg/dL (ref 5–40)

## 2021-06-14 NOTE — Patient Instructions (Addendum)
Medication Instructions:  Your physician recommends that you continue on your current medications as directed. Please refer to the Current Medication list given to you today.  *If you need a refill on your cardiac medications before your next appointment, please call your pharmacy*   Lab Work: TODAY: Fasting lipid panel If you have labs (blood work) drawn today and your tests are completely normal, you will receive your results only by: [place] (if you have MyChart) OR A paper copy in the mail If you have any lab test that is abnormal or we need to change your treatment, we will call you to review the results.   Testing/Procedures: Your physician has requested that you have an echocardiogram in December 2023. Echocardiography is a painless test that uses sound waves to create images of your heart. It provides your doctor with information about the size and shape of your heart and how well your hearts chambers and valves are working. This procedure takes approximately one hour. There are no restrictions for this procedure.  Your physician has requested that you have a CT Aorta in December 2023.     Follow-Up: At [facility], you and your health needs are our priority.  As part of our continuing mission to provide you with exceptional heart care, we have created designated Provider Care Teams.  These Care Teams include your primary Cardiologist (physician) and Advanced Practice Providers (APPs -  Physician Assistants and Nurse Practitioners) who all work together to provide you with the care you need, when you need it.  Your next appointment:   1 year(s)  The format for your next appointment:   In Person  Provider:   Werner Lean, MD     Other Instructions Please perform Blood pressure checks as discussed with Dr. Gasper Sells.

## 2021-06-15 ENCOUNTER — Telehealth: Payer: Self-pay

## 2021-06-15 DIAGNOSIS — I350 Nonrheumatic aortic (valve) stenosis: Secondary | ICD-10-CM

## 2021-06-15 MED ORDER — ROSUVASTATIN CALCIUM 5 MG PO TABS: 5.0000 mg | ORAL_TABLET | Freq: Every day | ORAL | 3 refills | Status: DC

## 2021-06-15 NOTE — Telephone Encounter (Signed)
Patient notified of result.  Please refer to phone note from today for complete details.   Precious Gilding, RN 06/15/2021 9:13 AM  RN advised pt that f/u labs need to be fasting. Pt verbalizes understanding.

## 2021-06-15 NOTE — Telephone Encounter (Signed)
-----   Message from Werner Lean, MD sent at 06/15/2021  8:33 AM EST ----- Results: LDL above goal Plan: Rosuvastatin 5 mg and labs in three months  Werner Lean, MD

## 2021-06-21 ENCOUNTER — Ambulatory Visit: Payer: 59 | Admitting: Family Medicine

## 2021-06-21 ENCOUNTER — Ambulatory Visit: Payer: Self-pay | Admitting: Family Medicine

## 2021-06-21 ENCOUNTER — Telehealth: Payer: Self-pay | Admitting: Neurology

## 2021-06-21 NOTE — Progress Notes (Deleted)
No chief complaint on file.    HISTORY OF PRESENT ILLNESS:  06/21/21 ALL:  Johnathan Anderson is a 56 y.o. male here today for follow up for migraines. MRI showed chronic microvascular ischemic changes but stable from 2015. He was started on topiramate and rizatriptan.   Tried and failed: topiramate, amitriptyline, propranolol (contraindicated d/t bradycardia), rizatriptan  HISTORY (copied from Dr Cathren Laine previous note)  HPI:  Johnathan Anderson is a 56 y.o. male here as requested by Lavone Orn, MD for headache vs TIA.PMHx vertigo.    In June he was working, he was walking, they went home and he started feeling a weird sensation in his head like dizziness, talking weird, his left arm went numb and left face went numb. He couldn't get the words out, dragging of his words. Went to the ED, they did not find anything abnormal, went home with continued symptoms. He gets symptoms with pressure in his forehead and eyeballs and ears and in the back of his head pressure. He feels numbness in the left arm and in the left check, usually all on the left side but headache is holocephalic, tylenol does not help, in between episodes symptoms go away but headaches and symptoms  can be triggered with motion, when traffic is passing by, or at night with the lights, can be bad when watching TV. He has an eye appointment upcoming to check his eyes. When he gets headache light bothers him, worse positionally and with movement, pulsating/pounding.throbbing,light sensitivity, sound sensitivity, he is smell sensitive, nausea, he was using chlorox and it caused a headache one day for example, some nausea with the headaches. Also some dizziness/imbalance but not spinning. No other focal neurologic deficits, associated symptoms, inciting events or modifiable factors. >15 headache days a month and > 8 migraine days a month.    Reviewed notes, labs and imaging from outside physicians, which showed:   CT head 12/03/20: showed  No acute intracranial abnormalities including mass lesion or mass effect, hydrocephalus, extra-axial fluid collection, midline shift, hemorrhage, or acute infarction, large ischemic events (personally reviewed images)   Tried: amitriptyline, propranolol contraindicated due to bradycardia   REVIEW OF SYSTEMS: Out of a complete 14 system review of symptoms, the patient complains only of the following symptoms, and all other reviewed systems are negative.   ALLERGIES: Allergies  Allergen Reactions   Levaquin [Levofloxacin]     Aortic Aneurysm     HOME MEDICATIONS: Outpatient Medications Prior to Visit  Medication Sig Dispense Refill   amoxicillin (AMOXIL) 500 MG capsule Take 1,000 mg by mouth 2 (two) times daily.     clarithromycin (BIAXIN) 500 MG tablet Take 500 mg by mouth 2 (two) times daily.     diclofenac Sodium (VOLTAREN) 1 % GEL Apply 1 application topically in the morning, at noon, and at bedtime.     fluconazole (DIFLUCAN) 100 MG tablet Take 100 mg by mouth daily.     omeprazole (PRILOSEC) 20 MG capsule Take 20 mg by mouth every morning.     rosuvastatin (CRESTOR) 5 MG tablet Take 1 tablet (5 mg total) by mouth daily. 90 tablet 3   No facility-administered medications prior to visit.     PAST MEDICAL HISTORY: Past Medical History:  Diagnosis Date   Vertigo      PAST SURGICAL HISTORY: Past Surgical History:  Procedure Laterality Date   APPENDECTOMY     EYE SURGERY     STERIOD INJECTION  09/16/2011   Procedure: MINOR STEROID INJECTION;  Surgeon: Lowella Grip, MD;  Location: [place];  Service: Orthopedics;  Laterality: Left;  Left Lumbar Five-Sacral One Transforaminal Epidural Steroid Injection   TEE WITHOUT CARDIOVERSION N/A 11/05/2020   Procedure: TRANSESOPHAGEAL ECHOCARDIOGRAM (TEE);  Surgeon: Werner Lean, MD;  Location: [facility] END[facility]COPY;  Service: Cardiovascular;  Laterality: N/A;     FAMILY HISTORY: Family History  Problem  Relation Age of Onset   Hypertension Mother    Diabetes Father    Migraines Neg Hx      SOCIAL HISTORY: Social History   Socioeconomic History   Marital status: Married    Spouse name: Not on file   Number of children: Not on file   Years of education: Not on file   Highest education level: Not on file  Occupational History   Not on file  Tobacco Use   Smoking status: Former   Smokeless tobacco: Never  Substance and Sexual Activity   Alcohol use: No   Drug use: No   Sexual activity: Not on file  Other Topics Concern   Not on file  Social History Narrative   Not on file   Social Determinants of Health   Financial Resource Strain: Not on file  Food Insecurity: Not on file  Transportation Needs: Not on file  Physical Activity: Not on file  Stress: Not on file  Social Connections: Not on file  Intimate Partner Violence: Not on file     PHYSICAL EXAM  There were no vitals filed for this visit. There is no height or weight on file to calculate BMI.  Generalized: Well developed, in no acute distress  Cardiology: normal rate and rhythm, no murmur auscultated  Respiratory: clear to auscultation bilaterally    Neurological examination  Mentation: Alert oriented to time, place, history taking. Follows all commands speech and language fluent Cranial nerve II-XII: Pupils were equal round reactive to light. Extraocular movements were full, visual field were full on confrontational test. Facial sensation and strength were normal. Uvula tongue midline. Head turning and shoulder shrug  were normal and symmetric. Motor: The motor testing reveals 5 over 5 strength of all 4 extremities. Good symmetric motor tone is noted throughout.  Sensory: Sensory testing is intact to soft touch on all 4 extremities. No evidence of extinction is noted.  Coordination: Cerebellar testing reveals good finger-nose-finger and heel-to-shin bilaterally.  Gait and station: Gait is normal. Tandem gait  is normal. Romberg is negative. No drift is seen.  Reflexes: Deep tendon reflexes are symmetric and normal bilaterally.    DIAGNOSTIC DATA (LABS, IMAGING, TESTING) - I reviewed patient records, labs, notes, testing and imaging myself where available.  Lab Results  Component Value Date   WBC 9.2 12/03/2020   HGB 14.6 12/03/2020   HCT 42.0 12/03/2020   MCV 85.7 12/03/2020   PLT 164 12/03/2020      Component Value Date/Time   NA 139 12/03/2020 2006   K 3.5 12/03/2020 2006   CL 104 12/03/2020 2006   CO2 26 12/03/2020 2006   GLUCOSE 121 (H) 12/03/2020 2006   BUN 26 (H) 12/03/2020 2006   CREATININE 1.28 (H) 12/03/2020 2006   CALCIUM 9.3 12/03/2020 2006   PROT 7.6 12/03/2020 2006   ALBUMIN 4.2 12/03/2020 2006   AST 28 12/03/2020 2006   ALT 37 12/03/2020 2006   ALKPHOS 59 12/03/2020 2006   BILITOT 0.4 12/03/2020 2006   GFRNONAA >60 12/03/2020 2006   Lab Results  Component Value Date   CHOL 174  06/14/2021   HDL 42 06/14/2021   LDLCALC 112 (H) 06/14/2021   TRIG 109 06/14/2021   CHOLHDL 4.1 06/14/2021   No results found for: HGBA1C No results found for: VITAMINB12 No results found for: TSH  No flowsheet data found.   Montreal Cognitive Assessment  02/24/2014  Visuospatial/ Executive (0/5) 5  Naming (0/3) 3  Attention: Read list of digits (0/2) 2  Attention: Read list of letters (0/1) 0  Attention: Serial 7 subtraction starting at 100 (0/3) 3  Language: Repeat phrase (0/2) 2  Language : Fluency (0/1) 0  Abstraction (0/2) 2  Delayed Recall (0/5) 2  Orientation (0/6) 6  Total 25  Adjusted Score (based on education) 26     ASSESSMENT AND PLAN  56 y.o. year old male  has a past medical history of Vertigo. here with    No diagnosis found.   No orders of the defined types were placed in this encounter.    No orders of the defined types were placed in this encounter.     Debbora Presto, MSN, FNP-C 06/21/2021, 7:57 AM  [facility] [address], [place], [place] [address] [phone]

## 2021-06-21 NOTE — Patient Instructions (Incomplete)

## 2021-06-21 NOTE — Telephone Encounter (Signed)
Pt cancelled appt due to being out of town

## 2021-06-22 ENCOUNTER — Encounter: Payer: Self-pay | Admitting: Internal Medicine

## 2021-06-23 MED ORDER — EZETIMIBE 10 MG PO TABS: 10.0000 mg | ORAL_TABLET | Freq: Every day | ORAL | 3 refills | Status: DC

## 2021-09-13 ENCOUNTER — Other Ambulatory Visit: Payer: Managed Care, Other (non HMO) | Admitting: *Deleted

## 2021-09-13 DIAGNOSIS — I350 Nonrheumatic aortic (valve) stenosis: Secondary | ICD-10-CM

## 2021-09-13 LAB — HEPATIC FUNCTION PANEL
ALT: 43 IU/L (ref 0–44)
AST: 29 IU/L (ref 0–40)
Albumin: 4.5 g/dL (ref 3.8–4.9)
Alkaline Phosphatase: 60 IU/L (ref 44–121)
Bilirubin Total: 0.5 mg/dL (ref 0.0–1.2)
Bilirubin, Direct: 0.13 mg/dL (ref 0.00–0.40)
Total Protein: 7.4 g/dL (ref 6.0–8.5)

## 2021-09-13 LAB — LIPID PANEL
Chol/HDL Ratio: 3.8 ratio (ref 0.0–5.0)
Cholesterol, Total: 175 mg/dL (ref 100–199)
HDL: 46 mg/dL (ref >39)
LDL Chol Calc (NIH): 100 mg/dL — ABNORMAL HIGH (ref 0–99)
Triglycerides: 165 mg/dL — ABNORMAL HIGH (ref 0–149)
VLDL Cholesterol Cal: 29 mg/dL (ref 5–40)

## 2021-09-17 ENCOUNTER — Telehealth: Payer: Self-pay

## 2021-09-17 DIAGNOSIS — E782 Mixed hyperlipidemia: Secondary | ICD-10-CM

## 2021-09-17 MED ORDER — ROSUVASTATIN CALCIUM 10 MG PO TABS: 10.0000 mg | ORAL_TABLET | Freq: Every day | ORAL | 3 refills | Status: DC

## 2021-09-17 NOTE — Telephone Encounter (Signed)
-----   Message from Werner Lean, MD sent at 09/17/2021  8:53 AM EDT ----- ?Results: ?LDL above goal ?Plan: ?Rosuvastatin 10 mg and labs in three months ? ?Werner Lean, MD ? ?

## 2021-09-17 NOTE — Telephone Encounter (Signed)
The patient has been notified of the result and verbalized understanding.  All questions (if any) were answered. ?Precious Gilding, RN 09/17/2021 10:27 AM   ?Lab appointment scheduled for 12/20/21.  Advised pt to fast prior.  ?

## 2021-12-17 ENCOUNTER — Other Ambulatory Visit: Payer: Self-pay | Admitting: Internal Medicine

## 2021-12-17 ENCOUNTER — Other Ambulatory Visit: Payer: Self-pay | Admitting: Gastroenterology

## 2021-12-17 DIAGNOSIS — R899 Unspecified abnormal finding in specimens from other organs, systems and tissues: Secondary | ICD-10-CM

## 2021-12-20 ENCOUNTER — Other Ambulatory Visit: Payer: Commercial Managed Care - HMO

## 2021-12-20 DIAGNOSIS — E782 Mixed hyperlipidemia: Secondary | ICD-10-CM

## 2021-12-20 LAB — ALT: ALT: 44 IU/L (ref 0–44)

## 2021-12-20 LAB — LIPID PANEL
Chol/HDL Ratio: 3.3 ratio (ref 0.0–5.0)
Cholesterol, Total: 133 mg/dL (ref 100–199)
HDL: 40 mg/dL (ref >39)
LDL Chol Calc (NIH): 72 mg/dL (ref 0–99)
Triglycerides: 116 mg/dL (ref 0–149)
VLDL Cholesterol Cal: 21 mg/dL (ref 5–40)

## 2021-12-23 ENCOUNTER — Ambulatory Visit
Admission: RE | Admit: 2021-12-23 | Discharge: 2021-12-23 | Disposition: A | Discharge status: Home / Self Care | Payer: Commercial Managed Care - HMO | Source: Ambulatory Visit | Attending: Gastroenterology | Admitting: Gastroenterology

## 2021-12-23 DIAGNOSIS — R899 Unspecified abnormal finding in specimens from other organs, systems and tissues: Secondary | ICD-10-CM

## 2022-01-11 IMAGING — CT CT ANGIO CHEST
3 of 8 series · 18 of 46 positions shown · IV contrast (OMNIPAQUE 350)
Comparison: 11/30/2020

CLINICAL DATA: Follow-up of aneurysmal disease of the ascending
thoracic aorta.

EXAM:
CT ANGIOGRAPHY CHEST WITH CONTRAST
TECHNIQUE: Multidetector CT imaging of the chest was performed using the
standard protocol during bolus administration of intravenous
contrast. Multiplanar CT image reconstructions and MIPs were
obtained to evaluate the vascular anatomy.
CONTRAST:  100mL OMNIPAQUE IOHEXOL 350 MG/ML SOLN

[Series 4: aorta 3.0 bf37 2 · axial · 0.76mm/px · z∈[-312,-50]mm · 13 of 103 slices shown]
[im 8/103  lung]
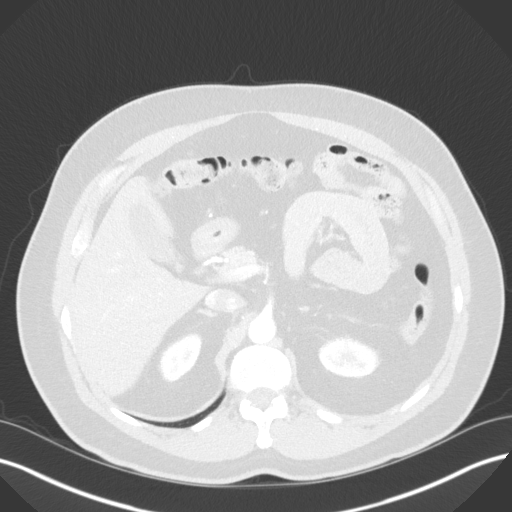
[im 15/103  soft-tissue]
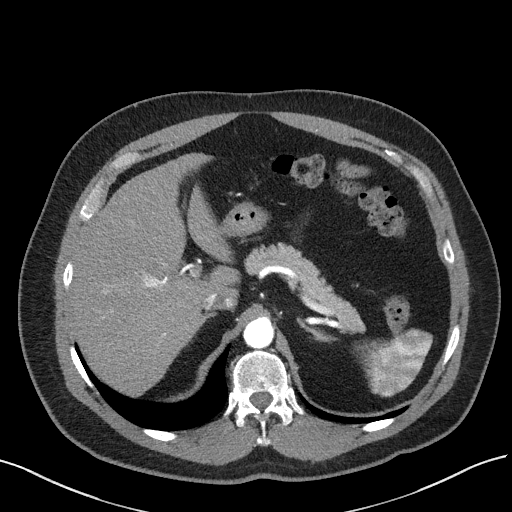
[im 22/103  lung]
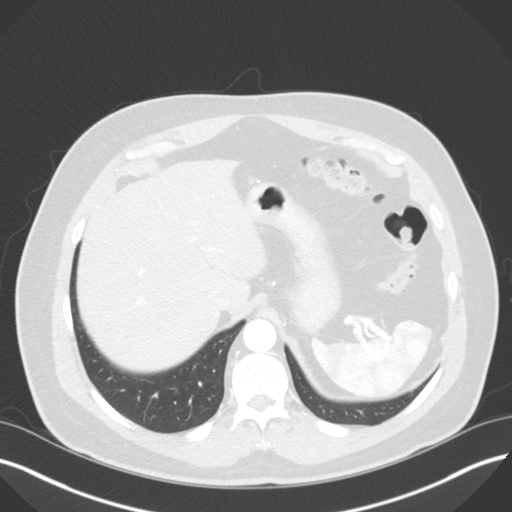
[im 30/103  soft-tissue]
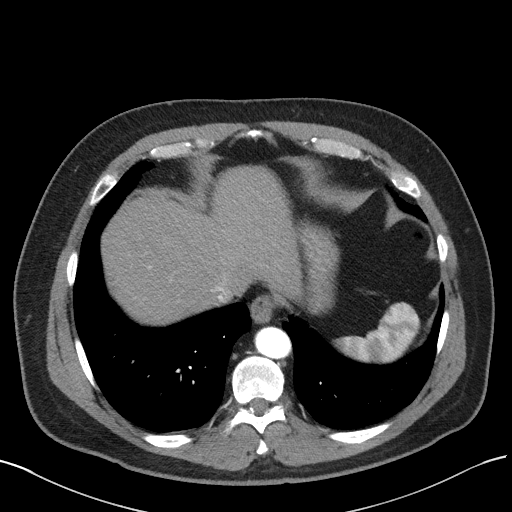
[im 37/103  lung]
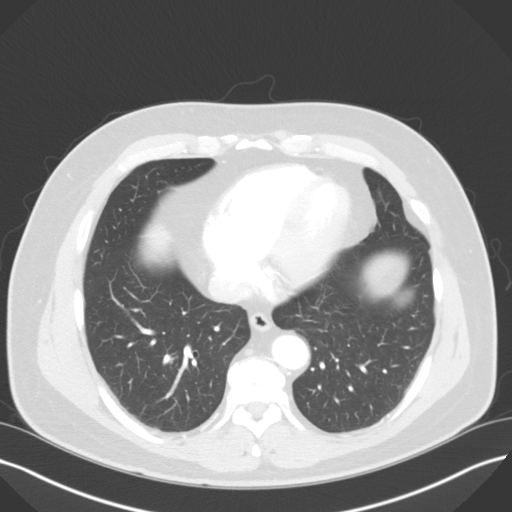
[im 44/103  soft-tissue]
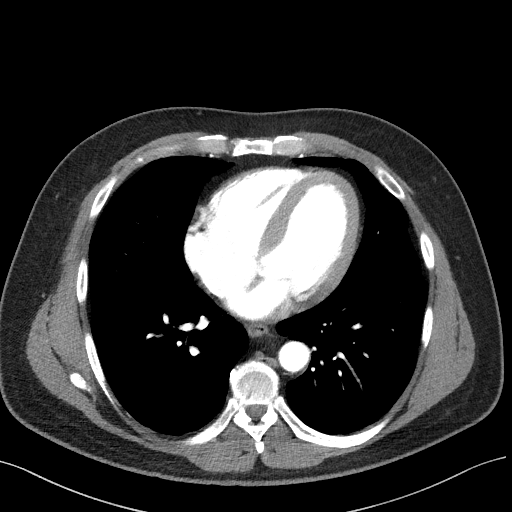
[im 52/103  lung]
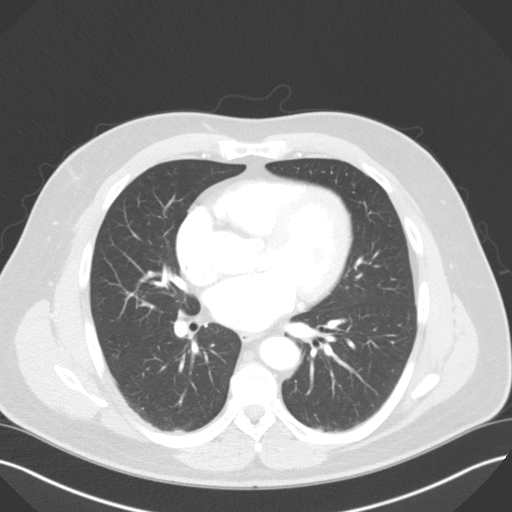
[im 59/103  soft-tissue]
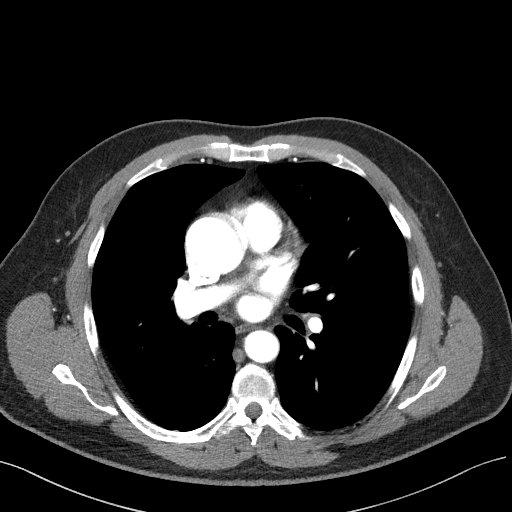
[im 66/103  lung]
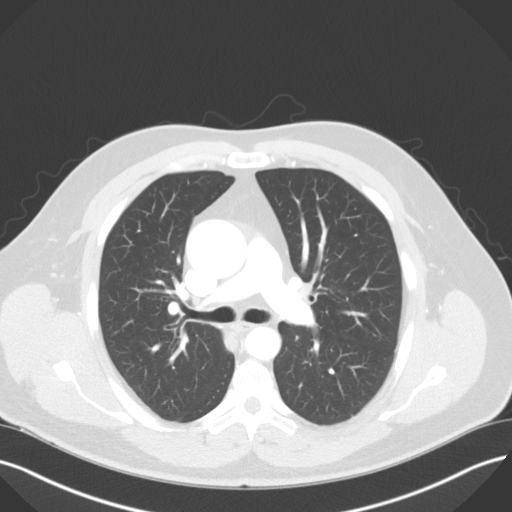
[im 73/103  soft-tissue]
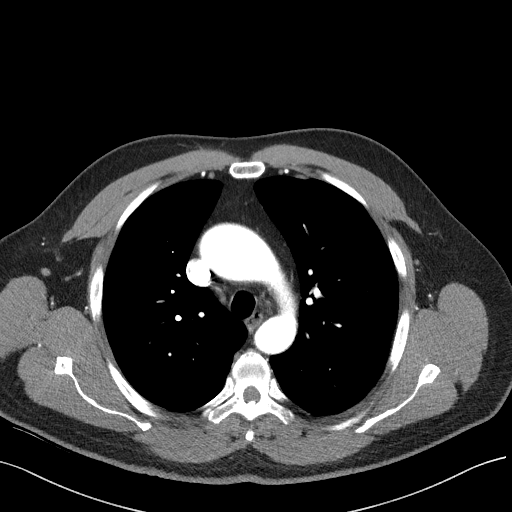
[im 81/103  lung]
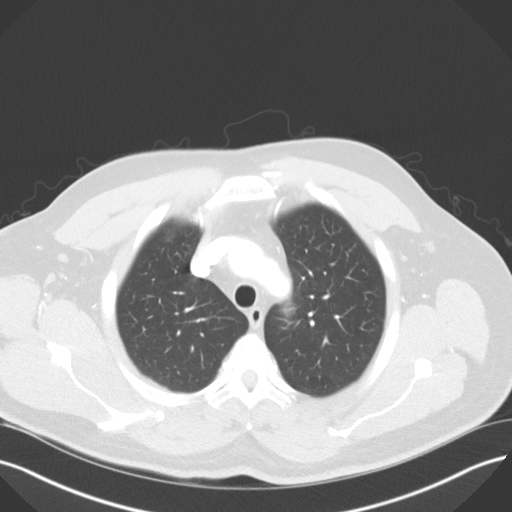
[im 88/103  soft-tissue]
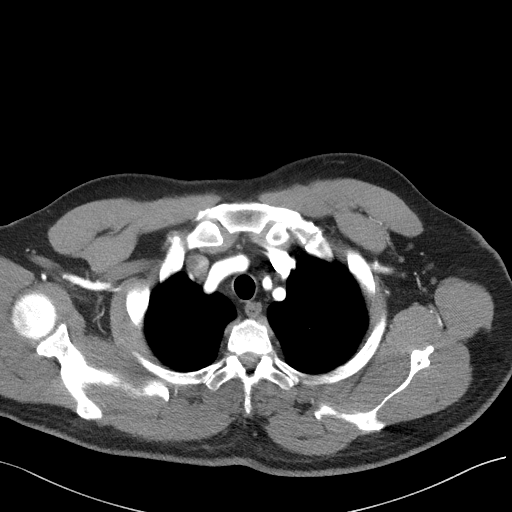
[im 95/103  lung]
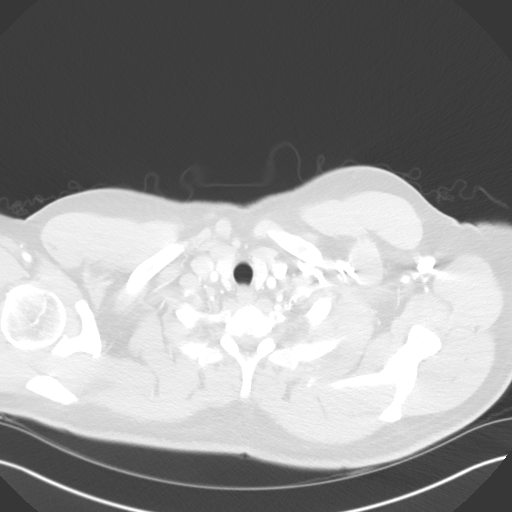

[Series 5: lung · axial · 0.76mm/px · z∈[-312,-270]mm · 2 of 103 slices shown]
[im 8/103  soft-tissue]
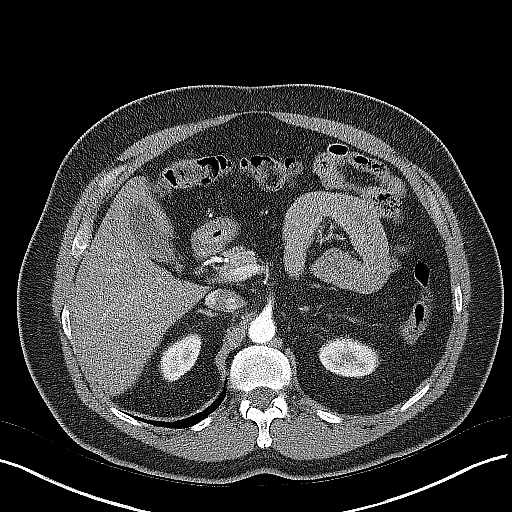
[im 22/103  soft-tissue]
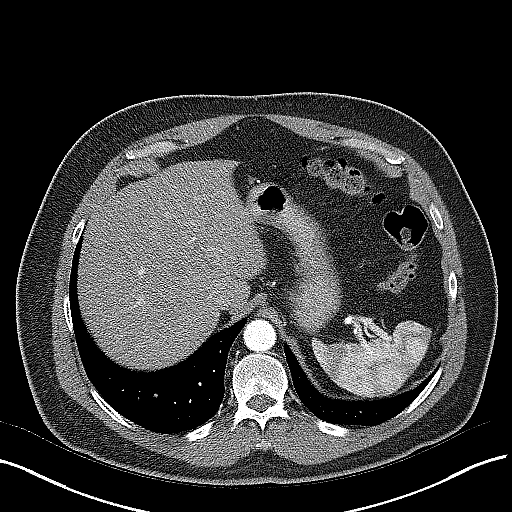

[Series 7: coronals · coronal · 0.63mm/px · 3 of 137 slices shown]
[im 35/137  soft-tissue]
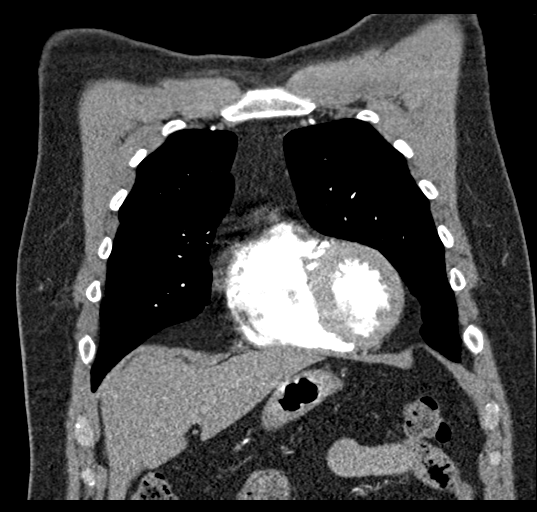
[im 69/137  soft-tissue]
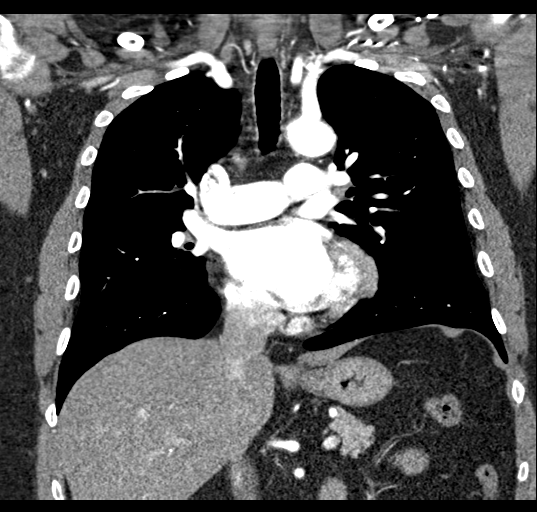
[im 103/137  soft-tissue]
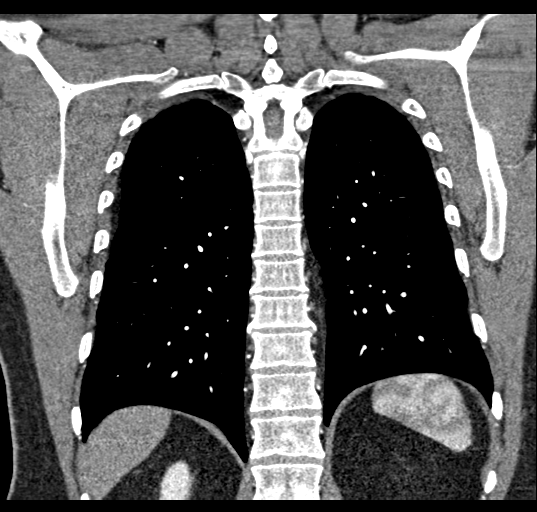

[18 of 46 positions shown; findings below may reference images not displayed]

FINDINGS: Cardiovascular: The aortic valve is again noted to be bicuspid,
thickened and partially calcified. The aortic root measures
approximately 2.8 cm at the level of the sinuses of Valsalva. The
ascending thoracic aorta reaches maximal diameter of approximately
4.4 cm. The proximal arch measures 3.7 cm and the distal arch
cm. The descending thoracic aorta measures 2.4 cm. There is no
evidence of aortic dissection. Visualized proximal great vessels
demonstrate normal patency and normal branching anatomy.

The heart size is stable and within normal limits. No pericardial
fluid identified. No significant visualized calcified coronary
artery plaque. Central pulmonary arteries are normal in caliber.

Mediastinum/Nodes: No enlarged mediastinal, hilar, or axillary lymph
nodes. Thyroid gland, trachea, and esophagus demonstrate no
significant findings.

Lungs/Pleura: There is no evidence of pulmonary edema,
consolidation, pneumothorax, nodule or pleural fluid.

Upper Abdomen: No acute abnormality.

Musculoskeletal: No chest wall abnormality. No acute or significant
osseous findings.

Review of the MIP images confirms the above findings.
IMPRESSION: Stable aneurysmal disease of the ascending thoracic aorta measuring
approximately 4.4 cm in maximum diameter by CTA today.

Recommend annual imaging followup by CTA or MRA. This recommendation
follows 0444 ACCF/AHA/AATS/ACR/ASA/SCA/FERNANDA/RASI/[place]ARCH[place]/CHIMA Guidelines
for the Diagnosis and Management of Patients with Thoracic Aortic
Disease. Circulation. 0444; 121: E266-e369. Aortic aneurysm NOS
(6MUGZ-LMZ.L)

Aortic aneurysm NOS (6MUGZ-LMZ.L).

## 2022-02-23 ENCOUNTER — Other Ambulatory Visit: Payer: Self-pay | Admitting: Family Medicine

## 2022-02-23 ENCOUNTER — Ambulatory Visit
Admission: RE | Admit: 2022-02-23 | Discharge: 2022-02-23 | Disposition: A | Discharge status: Home / Self Care | Payer: Commercial Managed Care - HMO | Source: Ambulatory Visit | Attending: Family Medicine | Admitting: Family Medicine

## 2022-02-23 DIAGNOSIS — M25512 Pain in left shoulder: Secondary | ICD-10-CM

## 2022-06-01 ENCOUNTER — Telehealth: Payer: Self-pay | Admitting: Internal Medicine

## 2022-06-01 DIAGNOSIS — I7121 Aneurysm of the ascending aorta, without rupture: Secondary | ICD-10-CM

## 2022-06-01 DIAGNOSIS — Q231 Congenital insufficiency of aortic valve: Secondary | ICD-10-CM

## 2022-06-01 DIAGNOSIS — Z8249 Family history of ischemic heart disease and other diseases of the circulatory system: Secondary | ICD-10-CM

## 2022-06-01 DIAGNOSIS — I350 Nonrheumatic aortic (valve) stenosis: Secondary | ICD-10-CM

## 2022-06-01 NOTE — Telephone Encounter (Signed)
Called number attached to call received message that pt is approved for free monitor alert.  Tried to tell rep that is not why I am calling.  Rep continued to transfer call to be set up for monitor.  Hung up call is not appropriate for pt care.

## 2022-06-01 NOTE — Telephone Encounter (Signed)
Fahema from [facility] is calling to get CT orders faxed to them for appt tomorrow.   Fax #  [phone]

## 2022-06-01 NOTE — Telephone Encounter (Signed)
[facility] spoke with [facility].  Advised that pt is having testing completed at [facility] on 06/02/22.  Was transferred to [facility] scheduling.  Was told pt is all set for appointment tomorrow at [facility] no order is needed.

## 2022-06-01 NOTE — Telephone Encounter (Signed)
  Johnathan Anderson is calling back, she said, the CT was not approved by pt's insurance and it needs to get it done at [facility] radiology. She said the appt at [facility] needs to be cancelled and send the order to [facility] radiology at fax# [phone]

## 2022-06-02 ENCOUNTER — Inpatient Hospital Stay: Admission: RE | Admit: 2022-06-02 | Payer: Managed Care, Other (non HMO) | Source: Ambulatory Visit

## 2022-06-02 ENCOUNTER — Ambulatory Visit (HOSPITAL_COMMUNITY): Payer: Commercial Managed Care - HMO | Attending: Internal Medicine

## 2022-06-02 ENCOUNTER — Ambulatory Visit (HOSPITAL_COMMUNITY): Payer: Commercial Managed Care - HMO

## 2022-06-02 DIAGNOSIS — Z8249 Family history of ischemic heart disease and other diseases of the circulatory system: Secondary | ICD-10-CM | POA: Insufficient documentation

## 2022-06-02 DIAGNOSIS — Q231 Congenital insufficiency of aortic valve: Secondary | ICD-10-CM | POA: Insufficient documentation

## 2022-06-02 DIAGNOSIS — I350 Nonrheumatic aortic (valve) stenosis: Secondary | ICD-10-CM | POA: Diagnosis not present

## 2022-06-02 DIAGNOSIS — I7121 Aneurysm of the ascending aorta, without rupture: Secondary | ICD-10-CM | POA: Insufficient documentation

## 2022-06-02 LAB — ECHOCARDIOGRAM COMPLETE
AR max vel: 0.85 cm2
AV Area VTI: 0.8 cm2
AV Area mean vel: 0.76 cm2
AV Mean grad: 30 mmHg
AV Peak grad: 49 mmHg
Ao pk vel: 3.5 m/s
Area-P 1/2: 3.31 cm2
S' Lateral: 2.5 cm

## 2022-06-02 NOTE — Telephone Encounter (Signed)
Attempted to call number on file for [employer].  Again call is for free medical alert.  Unable to reach [facility].    Called [employer] notified of the mishap with scheduling. Pt scheduled at [facility] and needs to be scheduled at [facility].   Asked if could call and schedule pt ASAP. Was told office will reach out to pt to schedule.

## 2022-06-08 ENCOUNTER — Telehealth: Payer: Self-pay

## 2022-06-08 DIAGNOSIS — I350 Nonrheumatic aortic (valve) stenosis: Secondary | ICD-10-CM

## 2022-06-08 DIAGNOSIS — I7121 Aneurysm of the ascending aorta, without rupture: Secondary | ICD-10-CM

## 2022-06-08 NOTE — Telephone Encounter (Signed)
Order placed for Echo due in 1 year- Dec 2024-Jan 2025.

## 2022-06-08 NOTE — Telephone Encounter (Signed)
-----   Message from Werner Lean, MD sent at 06/06/2022 10:53 AM EST ----- Results: Likely moderate aortic stenosis and moderate thoracic aortic aneurysm Plan: Echo in one year CT aorta when his insurance will cover this (6 months is recommended on established guidelines)  Werner Lean, MD

## 2022-06-13 NOTE — Telephone Encounter (Signed)
Per scheduling pt is all set to have CT on 06/14/22.  Collier Bullock, Bedelia Person, RN Per [facility] - Pt's Josem Kaufmann was first denied because of facility. It has been approved with appeal. I've updated notes and referral.

## 2022-06-14 ENCOUNTER — Ambulatory Visit (HOSPITAL_COMMUNITY)
Admission: RE | Admit: 2022-06-14 | Discharge: 2022-06-14 | Disposition: A | Discharge status: Home / Self Care | Payer: Commercial Managed Care - HMO | Source: Ambulatory Visit | Attending: Internal Medicine | Admitting: Internal Medicine

## 2022-06-14 DIAGNOSIS — Z8249 Family history of ischemic heart disease and other diseases of the circulatory system: Secondary | ICD-10-CM

## 2022-06-14 DIAGNOSIS — I350 Nonrheumatic aortic (valve) stenosis: Secondary | ICD-10-CM

## 2022-06-14 DIAGNOSIS — Q231 Congenital insufficiency of aortic valve: Secondary | ICD-10-CM | POA: Diagnosis not present

## 2022-06-14 DIAGNOSIS — I7121 Aneurysm of the ascending aorta, without rupture: Secondary | ICD-10-CM | POA: Insufficient documentation

## 2022-06-14 MED ORDER — IOHEXOL 350 MG/ML SOLN
75.0000 mL | Freq: Once | INTRAVENOUS | Status: AC | PRN
  Administered 2022-06-14: 75 mL via INTRAVENOUS

## 2022-06-14 MED ORDER — SODIUM CHLORIDE (PF) 0.9 % IJ SOLN
INTRAMUSCULAR | Status: AC
  Filled 2022-06-14: qty 50

## 2022-06-17 ENCOUNTER — Telehealth: Payer: Self-pay

## 2022-06-17 DIAGNOSIS — Q231 Congenital insufficiency of aortic valve: Secondary | ICD-10-CM

## 2022-06-17 DIAGNOSIS — I7121 Aneurysm of the ascending aorta, without rupture: Secondary | ICD-10-CM

## 2022-06-17 DIAGNOSIS — I350 Nonrheumatic aortic (valve) stenosis: Secondary | ICD-10-CM

## 2022-06-17 NOTE — Telephone Encounter (Signed)
-----  Message from Werner Lean, MD sent at 06/16/2022  1:45 PM EST ----- Stable Aneurysm- Echo in 6 months CT in one year

## 2022-06-17 NOTE — Telephone Encounter (Signed)
Orders placed for Echo in 6 months and CT aorta in 1 yr.

## 2022-06-20 ENCOUNTER — Other Ambulatory Visit: Payer: Self-pay | Admitting: Internal Medicine

## 2022-06-30 NOTE — Progress Notes (Addendum)
Histology and Location of Primary Skin Cancer:     Johnathan Anderson presented with the following signs/symptoms:    Past/Anticipated interventions by patient's surgeon/dermatologist for current problematic lesion, if any:     Past skin cancers, if any: none to report  History of Blistering sunburns, if any: yes, blistering is how he noticed problem  SAFETY ISSUES: Prior radiation? none Pacemaker/ICD? none Possible current pregnancy? none Is the patient on methotrexate? none  Current Complaints / other details:  what can he expect with radiation, pt being for aortic aneurysm actively, pt with scab on lip-reports good wound healing though painful  Vitals:   07/06/22 1303  BP: (!) 143/98  Pulse: 60  Resp: 20  Temp: 97.7 F (36.5 C)  SpO2: 100%

## 2022-07-05 NOTE — Progress Notes (Signed)
Radiation Oncology         [phone] ________________________________  Initial Outpatient Consultation  Name: Johnathan Anderson MRN: [id number]  Date: 07/06/2022  DOB: 05-06-1966  [id number], Geryl Rankins, MD  Karin Golden, MD   REFERRING PHYSICIAN: Karin Golden, MD  DIAGNOSIS: [phone]   ICD-10-CM   1. Malignant neoplasm of lip  C00.9     2. 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 male who presented to his dermatologist on 05/12/22 for evaluation of a painful, yellowish, crusty lesion located on his mid lower lip. The patient first noticed the lesion 3 months prior, and stated that it would fluctuate in severity. Encounter notes indicate that he was previously prescribed abx ointment for the lesion, but it is unclear when this was prescribed.   Biopsy of the mid lower lip collected on that same date (05/12/22) showed invasive squamous cell carcinoma, with involvement of the peripheral and deep edges.   Subsequently, the patient was referred to Dr. Winifred Olive on 06/23/22 for Mohs of the mid lower lip lesion. 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 (maximum diameter of the most involved nerve measures 0.7 mm.)   Patient is present with his supportive wife today. Patient is still having difficulty eating. He feels as though his mouth is too tight to open fully at this time. He is endorses some pain that is controlled with 1 Ibuprofen per day.  Patient states that both his pain and mouth tightness are continuing to improve.  PHOTOS FROM DR MITKOV     Images provided by Dr. Winifred Olive from Mohs surgery on 06/23/22.   PREVIOUS RADIATION THERAPY: No  PAST MEDICAL HISTORY:  has a past medical history of Aortic aneurysm ([place]), GERD (gastroesophageal reflux disease), Hyperlipidemia, and Vertigo.    PAST SURGICAL HISTORY: Past Surgical History:  Procedure Laterality Date   APPENDECTOMY     EYE SURGERY     STERIOD INJECTION  09/16/2011   Procedure: MINOR STEROID INJECTION;  Surgeon: Lowella Grip, MD;  Location: [facility];  Service: Orthopedics;  Laterality: Left;  Left Lumbar Five-Sacral One Transforaminal Epidural Steroid Injection   TEE WITHOUT CARDIOVERSION N/A 11/05/2020   Procedure: TRANSESOPHAGEAL ECHOCARDIOGRAM (TEE);  Surgeon: Werner Lean, MD;  Location: [facility];  Service: Cardiovascular;  Laterality: N/A;    FAMILY HISTORY: family history includes Diabetes in his father; Hypertension in his mother.  SOCIAL HISTORY:  reports that he has quit smoking. He has never used smokeless tobacco. He reports that he does not drink alcohol and does not use drugs.  ALLERGIES: Levaquin [levofloxacin]  MEDICATIONS:  Current Outpatient Medications  Medication Sig Dispense Refill   diclofenac Sodium (VOLTAREN) 1 % GEL Apply 1 application topically in the morning, at noon, and at bedtime.     ezetimibe (ZETIA) 10 MG tablet Take 1 tablet (10 mg total) by mouth daily. 90 tablet 0   rosuvastatin (CRESTOR) 10 MG tablet Take 1 tablet (10 mg total) by mouth daily. [place]  tablet 3   amoxicillin (AMOXIL) 500 MG capsule Take 1,000 mg by mouth 2 (two) times daily.     clarithromycin (BIAXIN) 500 MG tablet Take 500 mg by mouth 2 (two) times daily.     fluconazole (DIFLUCAN) 100 MG tablet Take 100 mg by mouth daily. (Patient not taking: Reported on 07/06/2022)     omeprazole (PRILOSEC) 20 MG capsule Take 20 mg by mouth  every morning. (Patient not taking: Reported on 07/06/2022)     No current facility-administered medications for this encounter.    REVIEW OF SYSTEMS:  Notable for that above.   PHYSICAL EXAM:  height is '5\' 6"'$  (1.676 m) and weight is 203 lb 6.4 oz (92.3 kg). His temperature is 97.7 F (36.5 C). His blood pressure is 143/98 (abnormal) and his pulse is 60. His respiration is 20 and oxygen saturation is 100%.   General: Alert and oriented, in no acute distress  HEENT: Head is normocephalic. Extraocular movements are intact. Surgical site at the left lower lip has a superficial scab that has not fully healed. The scab barely crosses midline. Moist tissue around the lip lesion that has not fully keratinized is visualized (see below). Mucosa in the inner lip is intact without any concerning lesions. No other lesions within his mouth noted.  Neck: Neck is supple, no palpable cervical or supraclavicular lymphadenopathy. Heart: Regular in rate and rhythm. Systolic murmur heard throughout, but best at the aortic and pulmonic areas; this has been evaluated by cardiology. Chest: Clear to auscultation bilaterally, with no rhonchi, wheezes, or rales. Abdomen: Soft, nontender, nondistended, with no rigidity or guarding. Extremities: No cyanosis or edema. Lymphatics: see Neck Exam Skin: No concerning lesions. Musculoskeletal: symmetric strength and muscle tone throughout. Neurologic: Cranial nerves II through XII are grossly intact. No obvious focalities. Speech is fluent. Coordination is intact. Psychiatric: Judgment and insight are intact. Affect is appropriate.   Photo obtained on 07/06/22  ECOG = 1  0 - Asymptomatic (Fully active, able to carry on all predisease activities without restriction)  1 - Symptomatic but completely ambulatory (Restricted in physically strenuous activity but ambulatory and able to carry out work of a light or sedentary nature. For example, light housework, office work)  2 -  Symptomatic, <50% in bed during the day (Ambulatory and capable of all self care but unable to carry out any work activities. Up and about more than 50% of waking hours)  3 - Symptomatic, >50% in bed, but not bedbound (Capable of only limited self-care, confined to bed or chair 50% or more of waking hours)  4 - Bedbound (Completely disabled. Cannot carry on any self-care. Totally confined to bed or chair)  5 - Death   Eustace Pen MM, Creech RH, Tormey DC, et al. [phone]). "Toxicity and response criteria of the [facility]". [place] Oncol. 5 (6): 649-55   LABORATORY DATA:  Lab Results  Component Value Date   WBC 9.2 12/03/2020   HGB 14.6 12/03/2020   HCT 42.0 12/03/2020   MCV 85.7 12/03/2020   PLT 164 12/03/2020   CMP     Component Value Date/Time   NA 139 12/03/2020 2006   K 3.5 12/03/2020 2006   CL 104 12/03/2020 2006   CO2 26 12/03/2020 2006   GLUCOSE 121 (H) 12/03/2020 2006   BUN 26 (H) 12/03/2020 2006   CREATININE 1.28 (H) 12/03/2020 2006   CALCIUM 9.3 12/03/2020 2006   PROT 7.4 09/13/2021 0801   ALBUMIN 4.5 09/13/2021 0801  AST 29 09/13/2021 0801   ALT 44 12/20/2021 0732   ALKPHOS 60 09/13/2021 0801   BILITOT 0.5 09/13/2021 0801   GFRNONAA >60 12/03/2020 2006         RADIOGRAPHY: CT ANGIO CHEST AORTA W/CM & OR WO/CM  Result Date: 06/14/2022 CLINICAL DATA:  Family history of aortic dissection EXAM: CT ANGIOGRAPHY CHEST WITH CONTRAST TECHNIQUE: Multidetector CT imaging of the chest was performed using the standard protocol during bolus administration of intravenous contrast. Multiplanar CT image reconstructions and MIPs were obtained to evaluate the vascular anatomy. RADIATION DOSE REDUCTION: This exam was performed according to the departmental dose-optimization program which includes automated exposure control, adjustment of the mA and/or kV according to patient size and/or use of iterative reconstruction technique. CONTRAST:  22m OMNIPAQUE IOHEXOL  350 MG/ML SOLN COMPARISON:  CT chest dated 06/04/2021 FINDINGS: Cardiovascular: Aortic Root: --Valve: 2.7 x 2.1 cm, previously 2.7 x 2.3 cm --Sinuses: 2.6 x 2.4 x 2.3 cm, previously 2.8 x 2.7 x 2.6 cm --Sinotubular Junction: 2.6 x 2.6 cm, previously 2.7 x 2.5 cm Limitations by motion: Moderate Thoracic Aorta: --Ascending Aorta: 4.6  X 4.4 cm, previously 4.6 x 4.6 cm --Aortic Arch: 3.3 x 3.3 cm, previously 3.2 x 3.0 cm --Descending Aorta: 2.7 x 2.5 cm cm, previously 2.6 x 2.6 cm Other: Normal heart size. No pericardial effusion. Mediastinum/Nodes: Imaged thyroid gland without nodules meeting criteria for imaging follow-up by size. Normal esophagus. No pathologically enlarged axillary, supraclavicular, mediastinal, or hilar lymph nodes. Lungs/Pleura: The central airways are patent. No focal consolidation. No pneumothorax. No pleural effusion. Upper abdomen: Normal. Musculoskeletal: No acute or abnormal lytic or blastic osseous lesions. Review of the MIP images confirms the above findings. IMPRESSION: 1. Unchanged ascending thoracic aortic aneurysm measuring up to 4.6 cm. Ascending thoracic aortic aneurysm. Recommend semi-annual imaging followup by CTA or MRA and referral to cardiothoracic surgery if not already obtained. This recommendation follows 2010 ACCF/AHA/AATS/ACR/ASA/SCA/SCAI/SIR/STS/SVM Guidelines for the Diagnosis and Management of Patients With Thoracic Aortic Disease. Circulation. 2010; 121:: B341-P379 Aortic aneurysm NOS (ICD10-I71.9) 2. No acute abnormality in the chest. No suspicious pulmonary nodules or masses. Electronically Signed   By: LDarrin NipperM.D.   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 I recommend CT of the neck to rule out lymphatic metastatic disease. CT scheduled for February 16th to allow for further healing  from surgery.   Patient is a good candidate for radiation therapy. Risks, benefits, and side effects of radiation therapy were discussed with the patient today. I recommend 4 weeks of electron radiotherapy to the lower lip to decrease the risk of locoregional disease recurrence. We spoke about the acute side effects including, but not limited to, fatigue, taste changes, skin peeling, lip desquamation, hair loss, and mucositis. The patient is enthusiastic about proceeding with treatment and I look forward to participating in his care. A consent form was signed today. Patient will be scheduled for CT simulation after CT of the neck on  07/22/22. Treatment will begin approximately 1 week after the CT simulation appointment. Patient knows to call with any questions or concerns in the meantime.   On date of service, in total, I spent 60 minutes on this encounter. Patient was seen in person.   __________________________________________   ELeona Singleton PA    SEppie Gibson MD  This document  serves as a record of services personally performed by Eppie Gibson, MD. It was created on her behalf by Roney Mans, a trained medical scribe. The creation of this record is based on the scribe's personal observations and the provider's statements to them. This document has been checked and approved by the attending provider.

## 2022-07-06 ENCOUNTER — Ambulatory Visit
Admission: RE | Admit: 2022-07-06 | Discharge: 2022-07-06 | Disposition: A | Discharge status: Home / Self Care | Payer: Commercial Managed Care - HMO | Source: Ambulatory Visit | Attending: Radiation Oncology | Admitting: Radiation Oncology

## 2022-07-06 ENCOUNTER — Other Ambulatory Visit: Payer: Self-pay

## 2022-07-06 ENCOUNTER — Encounter: Payer: Self-pay | Admitting: Radiation Oncology

## 2022-07-06 VITALS — BP 143/98 | HR 60 | Temp 97.7°F | Resp 20 | Ht 66.0 in | Wt 203.4 lb

## 2022-07-06 DIAGNOSIS — Z87891 Personal history of nicotine dependence: Secondary | ICD-10-CM | POA: Insufficient documentation

## 2022-07-06 DIAGNOSIS — C4402 Squamous cell carcinoma of skin of lip: Secondary | ICD-10-CM | POA: Insufficient documentation

## 2022-07-06 DIAGNOSIS — C009 Malignant neoplasm of lip, unspecified: Secondary | ICD-10-CM

## 2022-07-06 DIAGNOSIS — E785 Hyperlipidemia, unspecified: Secondary | ICD-10-CM | POA: Diagnosis not present

## 2022-07-06 DIAGNOSIS — Z8679 Personal history of other diseases of the circulatory system: Secondary | ICD-10-CM | POA: Insufficient documentation

## 2022-07-06 DIAGNOSIS — K219 Gastro-esophageal reflux disease without esophagitis: Secondary | ICD-10-CM | POA: Diagnosis not present

## 2022-07-06 HISTORY — DX: Aortic aneurysm of unspecified site, without rupture: I71.9

## 2022-07-06 HISTORY — DX: Gastro-esophageal reflux disease without esophagitis: K21.9

## 2022-07-06 HISTORY — DX: Hyperlipidemia, unspecified: E78.5

## 2022-07-06 NOTE — Progress Notes (Signed)
Oncology Nurse Navigator Documentation   Met with patient during initial consult with Dr. Isidore Moos. Johnathan Anderson was accompanied by is wife.  I introduced myself as his/their Navigator, explained my role as a member of the Care Team. Johnathan Anderson knows that Johnathan Anderson will have a CT neck with contrast on 2/16 or 2/19 and then come for a radiation planning session on 2/20 or 2/21 to allow for healing after his recent Mohs surgery.  They verbalized understanding of information provided. I encouraged them to call with questions/concerns moving forward.  Harlow Asa, RN, BSN, OCN Head & Neck Oncology Nurse [place] [facility] [place] [phone]

## 2022-07-07 ENCOUNTER — Ambulatory Visit: Payer: Commercial Managed Care - HMO | Attending: Internal Medicine | Admitting: Internal Medicine

## 2022-07-07 ENCOUNTER — Encounter: Payer: Self-pay | Admitting: Internal Medicine

## 2022-07-07 VITALS — BP 146/94 | HR 63 | Ht 66.0 in | Wt 202.6 lb

## 2022-07-07 DIAGNOSIS — Z8249 Family history of ischemic heart disease and other diseases of the circulatory system: Secondary | ICD-10-CM | POA: Diagnosis not present

## 2022-07-07 DIAGNOSIS — Q231 Congenital insufficiency of aortic valve: Secondary | ICD-10-CM | POA: Diagnosis not present

## 2022-07-07 DIAGNOSIS — I7121 Aneurysm of the ascending aorta, without rupture: Secondary | ICD-10-CM | POA: Diagnosis not present

## 2022-07-07 DIAGNOSIS — I10 Essential (primary) hypertension: Secondary | ICD-10-CM | POA: Diagnosis not present

## 2022-07-07 MED ORDER — ROSUVASTATIN CALCIUM 10 MG PO TABS: 10.0000 mg | ORAL_TABLET | Freq: Every day | ORAL | 3 refills | Status: DC

## 2022-07-07 MED ORDER — AMLODIPINE BESYLATE 5 MG PO TABS: 5.0000 mg | ORAL_TABLET | Freq: Every day | ORAL | 3 refills | Status: DC

## 2022-07-07 NOTE — Progress Notes (Signed)
Cardiology Office Note:    Date:  07/07/2022   ID:  Johnathan Anderson, DOB 1966-01-17, MRN [id number]  PCP:  Collene Leyden, [place]  Cardiologist:  Werner Lean, MD  Advanced Practice Provider:  No care team member to display Electrophysiologist:  None   CC: Follow up AS and TAA  History of Present Illness:    Johnathan Anderson is a 57 y.o. male with a hx of recent echocardiogram 08/14/20 who presents for evaluation 10/04/20.  In interim of this visit, patient had TEE and Cardiac CT- minimal CAC, Bicuspid Aortic valve with moderate aortic aneurysm. Has 2022 imaging with stable valve disease and aortic size.  Seen 06/14/21. 2023: Moderate to severe aortic stenosis with mean gradient 43 mm Hg, DVI 0.22, and   Patient notes that he is doing good.   Since last visit notes that he was diagnosed with skin cancer . Ten days ago has surgery to remove it. Still healing from this.  He is getting radiation to removed the little bit left (planned for 20 sessions of radiation). Had no issues with surgery.  No chest pain or pressure.  No SOB/DOE and no PND/Orthopnea.  No weight gain or leg swelling.  No palpitations or syncope .   Past Surgical History:  Procedure Laterality Date   APPENDECTOMY     EYE SURGERY     STERIOD INJECTION  09/16/2011   Procedure: MINOR STEROID INJECTION;  Surgeon: Lowella Grip, MD;  Location: [place];  Service: Orthopedics;  Laterality: Left;  Left Lumbar Five-Sacral One Transforaminal Epidural Steroid Injection   TEE WITHOUT CARDIOVERSION N/A 11/05/2020   Procedure: TRANSESOPHAGEAL ECHOCARDIOGRAM (TEE);  Surgeon: Werner Lean, MD;  Location: [facility] ENDOSCOPY;  Service: Cardiovascular;  Laterality: N/A;    Current Medications: Current Meds  Medication Sig   amLODipine (NORVASC) 5 MG tablet Take 1 tablet (5 mg total) by mouth daily.   calcium carbonate (TUMS) 500 MG chewable tablet as needed for  indigestion or heartburn.   diclofenac Sodium (VOLTAREN) 1 % GEL Apply 1 application topically in the morning, at noon, and at bedtime.   omeprazole (PRILOSEC) 20 MG capsule Take 20 mg by mouth every morning.   rosuvastatin (CRESTOR) 10 MG tablet Take 1 tablet (10 mg total) by mouth daily.     Allergies:   Levaquin [levofloxacin]   Social History   Socioeconomic History   Marital status: Married    Spouse name: Not on file   Number of children: Not on file   Years of education: Not on file   Highest education level: Not on file  Occupational History   Not on file  Tobacco Use   Smoking status: Former   Smokeless tobacco: Never  Substance and Sexual Activity   Alcohol use: No   Drug use: No   Sexual activity: Not on file  Other Topics Concern   Not on file  Social History Narrative   Not on file   Social Determinants of Health   Financial Resource Strain: Not on file  Food Insecurity: No Food Insecurity (07/06/2022)   Hunger Vital Sign    Worried About Running Out of Food in the Last Year: Never true    Ran Out of Food in the Last Year: Never true  Transportation Needs: No Transportation Needs (07/06/2022)   PRAPARE - Hydrologist (Medical): No    Lack of Transportation (Non-Medical): No  Physical Activity: Not  on file  Stress: Not on file  Social Connections: Not on file    Social: Comes with his wife; has many brothers and four sons; originally from [place] Works in Environmental consultant.  Family History: The patient's family history includes Diabetes in his father; Hypertension in his mother. There is no history of Migraines. History of coronary artery disease notable possibly brother. History of heart failure notable for no members. History of arrhythmia notable for no members Family history of sudden cardiac death: Brother was totally fine, had an argument with his daughter, and died suddenly. No history of bicuspid aortic  valve or aortic aneurysm. No history of cardiomyopathies including hypertrophic cardiomyopathy, left ventricular non-compaction, or arrhythmogenic right ventricular cardiomyopathy.   ROS:   Please see the history of present illness.     All other systems reviewed and are negative.  EKGs/Labs/Other Studies Reviewed:    The following studies were reviewed today:  EKG:   03/17/21: Sinus bradycardia 52 LVH 10/05/20: Sinus bradycardia borderline LVH 08/19/20 OSH;  SR rate 60 borderline LVH  Cardiac Studies & Procedures       ECHOCARDIOGRAM  ECHOCARDIOGRAM COMPLETE 06/02/2022  Narrative ECHOCARDIOGRAM REPORT    [name] Date of Exam: 06/02/2022 Medical Rec #:  [id number]      Height:       66.0 in Accession #:    [id number]     Weight:       200.0 lb Date of Birth:  [birth year]       BSA:          2.000 m Patient Age:    69 years       BP:           132/90 mmHg Patient Gender: M              HR:           54 bpm. Exam Location:  [facility]  Procedure: 2D Echo, Cardiac Doppler and Color Doppler  Indications:    I35.0 Nonrheumatic aortic (valve) stenosis; I71.2 Ascending aortic aneurysm  History:        Patient has prior history of Echocardiogram examinations, most recent 06/03/2021. Risk Factors:Former Smoker. Bicuspid aortic valve. Family history of aortic dissection. PAlpitations.  Sonographer:    Diamond Nickel RCS Referring Phys: [id number] [facility] A Tere Mcconaughey  IMPRESSIONS   1. The aortic valve is bicuspid with fusion of the LCC and RCC. There is moderate calcification of the aortic valve. There is moderate thickening of the aortic valve. Aortic valve regurgitation is trivial. Moderate to severe aortic valve stenosis. Aortic valve mean gradient measures 30.0 mmHg. Aortic valve Vmax measures 3.50 m/s. AVA 0.8cm2, DI 0.22. Visually, the valve appears more in the moderate range. 2. Left ventricular ejection fraction, by estimation, is 60 to 65%. The  left ventricle has normal function. The left ventricle has no regional wall motion abnormalities. Left ventricular diastolic parameters are consistent with Grade I diastolic dysfunction (impaired relaxation). 3. Right ventricular systolic function is normal. The right ventricular size is normal. There is normal pulmonary artery systolic pressure. The estimated right ventricular systolic pressure is 53.2 mmHg. 4. The mitral valve is normal in structure. Trivial mitral valve regurgitation. No evidence of mitral stenosis. 5. Aortic dilatation noted. There is mild dilatation of the ascending aorta, measuring 44 mm. 6. The inferior vena cava is normal in size with greater than 50% respiratory variability, suggesting right atrial pressure of 3 mmHg.  Comparison(s): Compared  to prior TTE on 05/2021, the aortic valve mean gradient is similar (75mHg on prior study). The ascending aorta is stable in size (previously 434m.  FINDINGS Left Ventricle: Left ventricular ejection fraction, by estimation, is 60 to 65%. The left ventricle has normal function. The left ventricle has no regional wall motion abnormalities. The left ventricular internal cavity size was normal in size. There is no left ventricular hypertrophy. Left ventricular diastolic parameters are consistent with Grade I diastolic dysfunction (impaired relaxation).  Right Ventricle: The right ventricular size is normal. No increase in right ventricular wall thickness. Right ventricular systolic function is normal. There is normal pulmonary artery systolic pressure. The tricuspid regurgitant velocity is 2.23 m/s, and with an assumed right atrial pressure of 3 mmHg, the estimated right ventricular systolic pressure is 2207.3mHg.  Left Atrium: Left atrial size was normal in size.  Right Atrium: Right atrial size was normal in size.  Pericardium: There is no evidence of pericardial effusion.  Mitral Valve: The mitral valve is normal in structure.  Trivial mitral valve regurgitation. No evidence of mitral valve stenosis.  Tricuspid Valve: The tricuspid valve is normal in structure. Tricuspid valve regurgitation is trivial.  Aortic Valve: AVA by continuity 0.77cm2, DI 0.22. The aortic valve is bicuspid. There is moderate calcification of the aortic valve. There is moderate thickening of the aortic valve. Aortic valve regurgitation is trivial. Moderate to severe aortic stenosis is present. Aortic valve mean gradient measures 30.0 mmHg. Aortic valve peak gradient measures 49.0 mmHg. Aortic valve area, by VTI measures 0.80 cm.  Pulmonic Valve: The pulmonic valve was normal in structure. Pulmonic valve regurgitation is trivial.  Aorta: Aortic dilatation noted. There is mild dilatation of the ascending aorta, measuring 44 mm.  Venous: The inferior vena cava is normal in size with greater than 50% respiratory variability, suggesting right atrial pressure of 3 mmHg.  IAS/Shunts: The atrial septum is grossly normal.   LEFT VENTRICLE PLAX 2D LVIDd:         3.90 cm   Diastology LVIDs:         2.50 cm   LV e' medial:    7.83 cm/s LV PW:         0.90 cm   LV E/e' medial:  12.8 LV IVS:        0.80 cm   LV e' lateral:   11.90 cm/s LVOT diam:     2.10 cm   LV E/e' lateral: 8.4 LV SV:         69 LV SV Index:   34 LVOT Area:     3.46 cm   RIGHT VENTRICLE RV Basal diam:  2.50 cm RV S prime:     9.79 cm/s TAPSE (M-mode): 1.4 cm RVSP:           22.9 mmHg  LEFT ATRIUM             Index        RIGHT ATRIUM           Index LA diam:        3.70 cm 1.85 cm/m   RA Pressure: 3.00 mmHg LA Vol (A2C):   32.3 ml 16.15 ml/m  RA Area:     9.49 cm LA Vol (A4C):   36.8 ml 18.40 ml/m  RA Volume:   16.60 ml  8.30 ml/m LA Biplane Vol: 36.3 ml 18.15 ml/m AORTIC VALVE AV Area (Vmax):    0.85 cm AV Area (Vmean):   0.76 cm  AV Area (VTI):     0.80 cm AV Vmax:           350.00 cm/s AV Vmean:          259.500 cm/s AV VTI:            0.862 m AV Peak  Grad:      49.0 mmHg AV Mean Grad:      30.0 mmHg LVOT Vmax:         86.20 cm/s LVOT Vmean:        56.600 cm/s LVOT VTI:          0.199 m LVOT/AV VTI ratio: 0.23  AORTA Ao Root diam: 2.90 cm Ao Asc diam:  4.40 cm  MITRAL VALVE                TRICUSPID VALVE MV Area (PHT): 3.31 cm     TR Peak grad:   19.9 mmHg MV Decel Time: 229 msec     TR Vmax:        223.00 cm/s MV E velocity: 100.00 cm/s  Estimated RAP:  3.00 mmHg MV A velocity: 92.80 cm/s   RVSP:           22.9 mmHg MV E/A ratio:  1.08 SHUNTS Systemic VTI:  0.20 m Systemic Diam: 2.10 cm  Gwyndolyn Kaufman MD Electronically signed by Gwyndolyn Kaufman MD Signature Date/Time: 06/02/2022/11:06:48 AM    Final   TEE  ECHO TEE 11/05/2020  Narrative TRANSESOPHOGEAL ECHO REPORT    Patient Name:   ULICE FOLLETT Date of Exam: 11/05/2020 Medical Rec #:  [id number]      Height:       66.0 in Accession #:    [id number]     Weight:       200.0 lb Date of Birth:  [birth year]       BSA:          2.000 m Patient Age:    39 years       BP:           113/64 mmHg Patient Gender: M              HR:           73 bpm. Exam Location:  Outpatient  Procedure: Transesophageal Echo, 3D Echo, Cardiac Doppler, Color Doppler and Saline Contrast Bubble Study  Indications:     Q23.1 Bicuspid aortic valve  History:         Patient has prior history of Echocardiogram examinations, most recent 08/14/2020.  Sonographer:     Darlina Sicilian RDCS Referring Phys:  [id number] [facility] A Gasper Sells Diagnosing Phys: Rudean Haskell MD  PROCEDURE: After discussion of the risks and benefits of a TEE, an informed consent was obtained from the patient. TEE procedure time was 39 minutes. The transesophogeal probe was passed without difficulty through the esophogus of the patient. Imaged were obtained with the patient in a left lateral decubitus position. No local oropharyngeal anesthetic was provided. Sedation performed by different physician. The patient  was monitored while under deep sedation. Anesthestetic sedation was provided intravenously by Anesthesiology: 453.'5mg'$  of Propofol, '40mg'$  of Lidocaine. Image quality was excellent. The patient's vital signs; including heart rate, blood pressure, and oxygen saturation; remained stable throughout the procedure. The patient developed no complications during the procedure.  IMPRESSIONS   1. The aortic valve is bicuspid. There is moderate calcification of the aortic valve. There is mild thickening of the aortic valve. Aortic valve regurgitation is trivial.  AVA by 3D planimetry 1.90 cm2. 2. Aortic dilatation noted. There is mild dilatation of the ascending aorta, measuring 44 mm. 3. Left ventricular ejection fraction, by estimation, is 60 to 65%. Left ventricular ejection fraction by 2D MOD biplane is 60.3 %. The left ventricle has normal function. The left ventricle has no regional wall motion abnormalities. 4. Right ventricular systolic function is normal. The right ventricular size is normal. 5. No left atrial/left atrial appendage thrombus was detected. 6. The mitral valve is grossly normal. Trivial mitral valve regurgitation. No evidence of mitral stenosis. 7. Agitated saline contrast bubble study was negative, with no evidence of any interatrial shunt.  Comparison(s): Compared to prior TTE, echodensity from prior study is most consistent with calcification from bicuspid aortic valve.  FINDINGS Left Ventricle: Left ventricular ejection fraction, by estimation, is 60 to 65%. Left ventricular ejection fraction by 2D MOD biplane is 60.3 %. The left ventricle has normal function. The left ventricle has no regional wall motion abnormalities. The left ventricular internal cavity size was normal in size.  Right Ventricle: The right ventricular size is normal. No increase in right ventricular wall thickness. Right ventricular systolic function is normal.  Left Atrium: Left atrial size was normal in size.  No left atrial/left atrial appendage thrombus was detected.  Right Atrium: Right atrial size was normal in size.  Pericardium: There is no evidence of pericardial effusion.  Mitral Valve: AVA by 3D planimetry 6.9 cm2. The mitral valve is grossly normal. Trivial mitral valve regurgitation. No evidence of mitral valve stenosis.  Tricuspid Valve: The tricuspid valve is normal in structure. Tricuspid valve regurgitation is not demonstrated.  Aortic Valve: AVA by 3D planimetry 1.90 cm2. The aortic valve is bicuspid. There is moderate calcification of the aortic valve. There is mild thickening of the aortic valve. Aortic valve regurgitation is trivial.  Pulmonic Valve: The pulmonic valve was grossly normal. Pulmonic valve regurgitation is not visualized.  Aorta: Aortic dilatation noted and the aortic root is normal in size and structure. There is mild dilatation of the ascending aorta, measuring 44 mm. There is minimal (Grade I) plaque involving the descending aorta.  IAS/Shunts: The atrial septum is grossly normal. Agitated saline contrast was given intravenously to evaluate for intracardiac shunting. Agitated saline contrast bubble study was negative, with no evidence of any interatrial shunt.   LEFT VENTRICLE PLAX 2D                        Biplane EF (MOD) LVOT diam:     2.00 cm         LV Biplane EF:   Left LVOT Area:     3.14 cm                         ventricular ejection fraction by LV Volumes (MOD)                                2D MOD LV vol d, MOD    63.8 ml                        biplane is A2C:  60.3 %. LV vol d, MOD    129.0 ml A4C: LV vol s, MOD    31.7 ml A2C: LV vol s, MOD    43.7 ml A4C: LV SV MOD A2C:   32.1 ml LV SV MOD A4C:   129.0 ml LV SV MOD BP:    58.8 ml   AORTA Ao Root diam: 2.90 cm Ao Asc diam:  4.40 cm   SHUNTS Systemic Diam: 2.00 cm  Rudean Haskell MD Electronically signed by Rudean Haskell  MD Signature Date/Time: 11/05/2020/7:48:54 PM    Final    CT SCANS  CT CORONARY MORPH W/CTA COR W/SCORE 11/30/2020  Addendum 11/30/2020  5:48 PM ADDENDUM REPORT: 11/30/2020 17:46  CLINICAL DATA:  Aortic Stenosis Assessment  EXAM: Cardiac TAVR CT  TECHNIQUE: The patient was scanned on a Siemens Force 366 slice scanner. A 120 kV retrospective scan was triggered in the descending thoracic aorta at 111 HU's. Gantry rotation speed was 270 msecs and collimation was .9 mm. No beta blockade or nitro were given. The 3D data set was reconstructed in 5% intervals of the R-R cycle. Systolic and diastolic phases were analyzed on a dedicated work station using MPR, MIP and VRT modes. The patient received 100 cc of contrast.  FINDINGS: Aortic Valve: Moderately thickened Siever's 1 bicuspid aortic valve with the planimeter valve area is 2.62 cm2  LVOT calcification: None  Annular calcification: Minimal  Aortic Valve Calcium Score: 420  Prosthetic Valve: NA  Mitral Valve: No mitral annular calcification  Left Ventricular Function: LVEF 60% without wall motion abnormality  Aortic Annulus Measurements  Major annulus diameter: 28 mm  Minor annulus diameter: 21 mm  Annular perimeter: 76 mm  Annular area: 4.43 cm2  Aortic Root Measurements  Sinus of Valsalva perimeter: 93 mm  Sinotubular Junction: 2.5 cm  Ascending Thoracic Aorta: 45 mm  Aortic Arch: 37 mm  Descending Thoracic Aorta: 23 mm  Sinus of Valsalva Measurements:  Right coronary cusp width: 26 mm  Left coronary cusp width: 26 mm  Non coronary cusp width: 29 mm  Coronary Artery Height above Annulus:  Left Main: 11 mm  Right Coronary: 8 mm  Coronary Arteries: No nitroglycerin given for coronary artery patency assessment  Coronary Artery Calcium score:  Coronary Calcium Score:  Left main: 0  Left anterior descending artery: 0.366  Left circumflex artery: 0  Right coronary artery: 0  Total:  0.366  Percentile: 56th for age, sex, and race matched control.  Optimum Fluoroscopic Angle for Delivery if TAVR were deployed: LAO 2, CAU 2  IMPRESSION: 1. Anatomically mild aortic stenosis of a bicuspid valve. Findings pertinent to TAVR procedure are detailed above, though at this time valve team assessment is deferred.  2. Moderate ascending aortic aneurysm without evidence of concomitant coarctation of the aorta.  3. Patient's total coronary artery calcium score is 0.366, which is 56th percentile for subjects of the same age, gender, and race based populations.  RECOMMENDATIONS:  Coronary artery calcium (CAC) score is a strong predictor of incident coronary heart disease (CHD) and provides predictive information beyond traditional risk factors. CAC scoring is reasonable to use in the decision to withhold, postpone, or initiate statin therapy in intermediate-risk or selected borderline-risk asymptomatic adults (age 14-75 years and LDL-C >=70 to <190 mg/dL) who do not have diabetes or established atherosclerotic cardiovascular disease (ASCVD).* In intermediate-risk (10-year ASCVD risk >=7.5% to <20%) adults or selected borderline-risk (10-year ASCVD risk >=5% to <7.5%) adults in whom a CAC score is measured  for the purpose of making a treatment decision the following recommendations have been made:  If CAC = 0, it is reasonable to withhold statin therapy and reassess in 5 to 10 years, as long as higher risk conditions are absent (diabetes mellitus, family history of premature CHD in first degree relatives (males <55 years; females <65 years), cigarette smoking, LDL >=190 mg/dL or other independent risk factors).  If CAC is 1 to 99, it is reasonable to initiate statin therapy for patients >=20 years of age.  If CAC is >=100 or >=75th percentile, it is reasonable to initiate statin therapy at any age.  Cardiology referral should be considered for patients with  CAC scores >=400 or >=75th percentile.  *2018 AHA/ACC/AACVPR/AAPA/ABC/ACPM/ADA/AGS/APhA/ASPC/NLA/PCNA Guideline on the Management of Blood Cholesterol: A Report of the American College of Cardiology/American Heart Association Task Force on Clinical Practice Guidelines. J Am Coll Cardiol. 2019;73(24):3168-3209.  Lezlie Ritchey   Electronically Signed By: Rudean Haskell MD On: 11/30/2020 17:46  Narrative EXAM: OVER-READ INTERPRETATION  CT CHEST  The following report is an over-read performed by radiologist Dr. Lucrezia Europe of [facility] on 11/30/2020. This over-read does not include interpretation of cardiac or coronary anatomy or pathology. The coronary CTA interpretation by the cardiologist is attached.  COMPARISON:  None.  FINDINGS: Vascular: Heart size normal. No pericardial effusion. Fair contrast opacification of central pulmonary arteries without evident filling defect to suggest acute PE. Aortic valve leaflet coarse calcifications. 4.4 cm ascending thoracic aortic aneurysm. No aortic dissection or stenosis. 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  Mediastinum/Nodes: No mass or adenopathy.  Lungs/Pleura: No pleural effusion. No pneumothorax. 3 mm subpleural nodule, superior segment right lower lobe (Im41,Se10) . 3 mm subpleural nodule, lateral basal segment left lower lobe image 55. Lungs otherwise clear.  Upper Abdomen: See separate CTA abdomen from the same  Musculoskeletal: No chest wall mass or suspicious bone lesions identified.  IMPRESSION: Small bilateral pulmonary nodules less than 4 mm. No follow-up needed if patient is low-risk. Non-contrast chest CT can be considered in 12 months if patient is high-risk. This recommendation follows the consensus statement: Guidelines for Management of Incidental Pulmonary Nodules Detected on CT Images: From the Fleischner Society 2017; Radiology  2017; 284:228-243.  4.4 cm ascending thoracic aortic aneurysm. Recommend annual imaging followup by CTA or MRA. This recommendation follows 2010 ACCF/AHA/AATS/ACR/ASA/SCA/SCAI/SIR/STS/SVM Guidelines for the Diagnosis and Management of Patients with Thoracic Aortic Disease. Circulation. 2010; 121: J628-B151  Electronically Signed: By: Lucrezia Europe M.D. On: 11/30/2020 12:43             Recent Labs: 12/20/2021: ALT 44  Recent Lipid Panel    Component Value Date/Time   CHOL 133 12/20/2021 0732   TRIG 116 12/20/2021 0732   HDL 40 12/20/2021 0732   CHOLHDL 3.3 12/20/2021 0732   LDLCALC 72 12/20/2021 0732     Physical Exam:    VS:  BP (!) 146/94   Pulse 63   Ht '5\' 6"'$  (1.676 m)   Wt 202 lb 9.6 oz (91.9 kg)   SpO2 98%   BMI 32.70 kg/m     Wt Readings from Last 3 Encounters:  07/07/22 202 lb 9.6 oz (91.9 kg)  07/06/22 203 lb 6.4 oz (92.3 kg)  06/14/21 200 lb (90.7 kg)    GEN:  Well nourished, well developed in no acute distress HEENT: Normal NECK: No JVD; LYMPHATICS: No lymphadenopathy CARDIAC: RRR, III/VI systolic murmur with no rubs, gallops RESPIRATORY:  Clear to auscultation without rales, wheezing or rhonchi  ABDOMEN: Soft, non-tender, non-distended MUSCULOSKELETAL:  No edema; No deformity  SKIN: Warm and dry NEUROLOGIC:  Alert and oriented x 3 PSYCHIATRIC:  Normal affect   ASSESSMENT:    1. Aneurysm of ascending aorta without rupture ([place])   2. Bicuspid aortic valve   3. Family history of aortic dissection   4. Essential hypertension     PLAN:    Asymptomatic thoracic aortic aneurysm Moderate to severe Aortic Stenosis (asymptomatic with notable exertion) Bicuspid Aortic Valve without Coarctation of the Aorta Possible Fhx Aortic Dissection (Brother) HTN - Last at 28m, DVI is 0.22 gradient in 33 mmHg - echo in June, CT in January -  will repeat lipids, LDL goal < 70; LDL near goal in end of 2023 - start norvasc 5 mg PO daily  He has discussed the  national history of this disease and we have reviewed this His [place]; we discussed how his other children can  One year f/u with Me  Time Spent Directly with Patient:   I have spent a total of 40 minutes with the patient reviewing notes, imaging, EKGs, labs and examining the patient as well as establishing an assessment and plan that was discussed personally with the patient.  > 50% of time was spent in direct patient care.   Medication Adjustments/Labs and Tests Ordered: Current medicines are reviewed at length with the patient today.  Concerns regarding medicines are outlined above.  No orders of the defined types were placed in this encounter.   Meds ordered this encounter  Medications   amLODipine (NORVASC) 5 MG tablet    Sig: Take 1 tablet (5 mg total) by mouth daily.    Dispense:  90 tablet    Refill:  3   rosuvastatin (CRESTOR) 10 MG tablet    Sig: Take 1 tablet (10 mg total) by mouth daily.    Dispense:  90 tablet    Refill:  3     Patient Instructions  Medication Instructions:  Your physician has recommended you make the following change in your medication:  START: amlodipine (Norvasc) 5 mg by mouth once daily  *If you need a refill on your cardiac medications before your next appointment, please call your pharmacy*   Lab Work: NONE If you have labs (blood work) drawn today and your tests are completely normal, you will receive your results only by: [place](if you have MyChart) OR A paper copy in the mail If you have any lab test that is abnormal or we need to change your treatment, we will call you to review the results.   Testing/Procedures: JULY 2024: Your physician has requested that you have an echocardiogram. Echocardiography is a painless test that uses sound waves to create images of your heart. It provides your doctor with information about the size and shape of your heart and how well your heart's chambers and  valves are working. This procedure takes approximately one hour. There are no restrictions for this procedure. Please do NOT wear cologne, perfume, aftershave, or lotions (deodorant is allowed). Please arrive 15 minutes prior to your appointment time.  CT Aorta in Jan 2025.     Follow-Up: [facility] and your health needs are our priority.  As part of our continuing mission to provide you with exceptional heart care, we have created designated Provider Care Teams.  These Care Teams include your primary Cardiologist (physician) and  Advanced Practice Providers (APPs -  Physician Assistants and Nurse Practitioners) who all work together to provide you with the care you need, when you need it.      Your next appointment:   1 year(s)  Provider:   Werner Lean, MD       Signed, Werner Lean, MD  07/07/2022 5:33 PM    [place]

## 2022-07-07 NOTE — Patient Instructions (Signed)
Medication Instructions:  Your physician has recommended you make the following change in your medication:  START: amlodipine (Norvasc) 5 mg by mouth once daily  *If you need a refill on your cardiac medications before your next appointment, please call your pharmacy*   Lab Work: NONE If you have labs (blood work) drawn today and your tests are completely normal, you will receive your results only by: [place] (if you have MyChart) OR A paper copy in the mail If you have any lab test that is abnormal or we need to change your treatment, we will call you to review the results.   Testing/Procedures: JULY 2024: Your physician has requested that you have an echocardiogram. Echocardiography is a painless test that uses sound waves to create images of your heart. It provides your doctor with information about the size and shape of your heart and how well your heart's chambers and valves are working. This procedure takes approximately one hour. There are no restrictions for this procedure. Please do NOT wear cologne, perfume, aftershave, or lotions (deodorant is allowed). Please arrive 15 minutes prior to your appointment time.  CT Aorta in Jan 2025.     Follow-Up: At [facility], you and your health needs are our priority.  As part of our continuing mission to provide you with exceptional heart care, we have created designated Provider Care Teams.  These Care Teams include your primary Cardiologist (physician) and Advanced Practice Providers (APPs -  Physician Assistants and Nurse Practitioners) who all work together to provide you with the care you need, when you need it.      Your next appointment:   1 year(s)  Provider:   Werner Lean, MD

## 2022-07-08 NOTE — Addendum Note (Signed)
Addended by: Jeremy Johann on: 07/08/2022 01:49 PM   Modules accepted: Orders

## 2022-07-22 ENCOUNTER — Ambulatory Visit (HOSPITAL_COMMUNITY)
Admission: RE | Admit: 2022-07-22 | Discharge: 2022-07-22 | Disposition: A | Discharge status: Home / Self Care | Payer: Commercial Managed Care - HMO | Source: Ambulatory Visit | Attending: Radiation Oncology | Admitting: Radiation Oncology

## 2022-07-22 DIAGNOSIS — C009 Malignant neoplasm of lip, unspecified: Secondary | ICD-10-CM | POA: Diagnosis present

## 2022-07-22 MED ORDER — IOHEXOL 300 MG/ML  SOLN
100.0000 mL | Freq: Once | INTRAMUSCULAR | Status: AC | PRN
  Administered 2022-07-22: 100 mL via INTRAVENOUS

## 2022-07-22 MED ORDER — SODIUM CHLORIDE (PF) 0.9 % IJ SOLN
INTRAMUSCULAR | Status: AC
  Filled 2022-07-22: qty 50

## 2022-07-26 ENCOUNTER — Other Ambulatory Visit: Payer: Self-pay

## 2022-07-26 ENCOUNTER — Ambulatory Visit
Admission: RE | Admit: 2022-07-26 | Discharge: 2022-07-26 | Disposition: A | Discharge status: Home / Self Care | Payer: Commercial Managed Care - HMO | Source: Ambulatory Visit | Attending: Radiation Oncology | Admitting: Radiation Oncology

## 2022-07-26 DIAGNOSIS — Z510 Encounter for antineoplastic radiation therapy: Secondary | ICD-10-CM | POA: Insufficient documentation

## 2022-07-26 DIAGNOSIS — C4402 Squamous cell carcinoma of skin of lip: Secondary | ICD-10-CM | POA: Diagnosis present

## 2022-07-27 DIAGNOSIS — Z510 Encounter for antineoplastic radiation therapy: Secondary | ICD-10-CM | POA: Diagnosis not present

## 2022-08-01 ENCOUNTER — Ambulatory Visit: Payer: Commercial Managed Care - HMO | Admitting: Radiation Oncology

## 2022-08-01 ENCOUNTER — Ambulatory Visit
Admission: RE | Admit: 2022-08-01 | Discharge: 2022-08-01 | Disposition: A | Discharge status: Home / Self Care | Payer: Commercial Managed Care - HMO | Source: Ambulatory Visit | Attending: Radiation Oncology | Admitting: Radiation Oncology

## 2022-08-01 ENCOUNTER — Other Ambulatory Visit: Payer: Self-pay

## 2022-08-01 DIAGNOSIS — Z510 Encounter for antineoplastic radiation therapy: Secondary | ICD-10-CM | POA: Diagnosis not present

## 2022-08-01 LAB — RAD ONC ARIA SESSION SUMMARY
Course Elapsed Days: 0
Plan Fractions Treated to Date: 1
Plan Prescribed Dose Per Fraction: 2.5 Gy
Plan Total Fractions Prescribed: 20
Plan Total Prescribed Dose: 50 Gy
Reference Point Dosage Given to Date: 2.5 Gy
Reference Point Session Dosage Given: 2.5 Gy
Session Number: 1

## 2022-08-02 ENCOUNTER — Other Ambulatory Visit: Payer: Self-pay

## 2022-08-02 ENCOUNTER — Ambulatory Visit
Admission: RE | Admit: 2022-08-02 | Discharge: 2022-08-02 | Disposition: A | Discharge status: Home / Self Care | Payer: Commercial Managed Care - HMO | Source: Ambulatory Visit | Attending: Radiation Oncology | Admitting: Radiation Oncology

## 2022-08-02 DIAGNOSIS — Z510 Encounter for antineoplastic radiation therapy: Secondary | ICD-10-CM | POA: Diagnosis not present

## 2022-08-02 LAB — RAD ONC ARIA SESSION SUMMARY
Course Elapsed Days: 1
Plan Fractions Treated to Date: 2
Plan Prescribed Dose Per Fraction: 2.5 Gy
Plan Total Fractions Prescribed: 20
Plan Total Prescribed Dose: 50 Gy
Reference Point Dosage Given to Date: 5 Gy
Reference Point Session Dosage Given: 2.5 Gy
Session Number: 2

## 2022-08-03 ENCOUNTER — Ambulatory Visit
Admission: RE | Admit: 2022-08-03 | Discharge: 2022-08-03 | Disposition: A | Discharge status: Home / Self Care | Payer: Commercial Managed Care - HMO | Source: Ambulatory Visit | Attending: Radiation Oncology | Admitting: Radiation Oncology

## 2022-08-03 ENCOUNTER — Other Ambulatory Visit: Payer: Self-pay

## 2022-08-03 DIAGNOSIS — Z510 Encounter for antineoplastic radiation therapy: Secondary | ICD-10-CM | POA: Diagnosis not present

## 2022-08-03 LAB — RAD ONC ARIA SESSION SUMMARY
Course Elapsed Days: 2
Plan Fractions Treated to Date: 3
Plan Prescribed Dose Per Fraction: 2.5 Gy
Plan Total Fractions Prescribed: 20
Plan Total Prescribed Dose: 50 Gy
Reference Point Dosage Given to Date: 7.5 Gy
Reference Point Session Dosage Given: 2.5 Gy
Session Number: 3

## 2022-08-04 ENCOUNTER — Ambulatory Visit
Admission: RE | Admit: 2022-08-04 | Discharge: 2022-08-04 | Disposition: A | Discharge status: Home / Self Care | Payer: Commercial Managed Care - HMO | Source: Ambulatory Visit | Attending: Radiation Oncology | Admitting: Radiation Oncology

## 2022-08-04 ENCOUNTER — Other Ambulatory Visit: Payer: Self-pay

## 2022-08-04 DIAGNOSIS — Z510 Encounter for antineoplastic radiation therapy: Secondary | ICD-10-CM | POA: Diagnosis not present

## 2022-08-04 LAB — RAD ONC ARIA SESSION SUMMARY
Course Elapsed Days: 3
Plan Fractions Treated to Date: 4
Plan Prescribed Dose Per Fraction: 2.5 Gy
Plan Total Fractions Prescribed: 20
Plan Total Prescribed Dose: 50 Gy
Reference Point Dosage Given to Date: 10 Gy
Reference Point Session Dosage Given: 2.5 Gy
Session Number: 4

## 2022-08-05 ENCOUNTER — Other Ambulatory Visit: Payer: Self-pay

## 2022-08-05 ENCOUNTER — Ambulatory Visit
Admission: RE | Admit: 2022-08-05 | Discharge: 2022-08-05 | Disposition: A | Discharge status: Home / Self Care | Payer: Commercial Managed Care - HMO | Source: Ambulatory Visit | Attending: Radiation Oncology | Admitting: Radiation Oncology

## 2022-08-05 DIAGNOSIS — C4402 Squamous cell carcinoma of skin of lip: Secondary | ICD-10-CM | POA: Diagnosis present

## 2022-08-05 DIAGNOSIS — Z510 Encounter for antineoplastic radiation therapy: Secondary | ICD-10-CM | POA: Diagnosis present

## 2022-08-05 LAB — RAD ONC ARIA SESSION SUMMARY
Course Elapsed Days: 4
Plan Fractions Treated to Date: 5
Plan Prescribed Dose Per Fraction: 2.5 Gy
Plan Total Fractions Prescribed: 20
Plan Total Prescribed Dose: 50 Gy
Reference Point Dosage Given to Date: 12.5 Gy
Reference Point Session Dosage Given: 2.5 Gy
Session Number: 5

## 2022-08-08 ENCOUNTER — Ambulatory Visit
Admission: RE | Admit: 2022-08-08 | Discharge: 2022-08-08 | Disposition: A | Discharge status: Home / Self Care | Payer: Commercial Managed Care - HMO | Source: Ambulatory Visit | Attending: Radiation Oncology | Admitting: Radiation Oncology

## 2022-08-08 ENCOUNTER — Other Ambulatory Visit: Payer: Self-pay

## 2022-08-08 DIAGNOSIS — Z510 Encounter for antineoplastic radiation therapy: Secondary | ICD-10-CM | POA: Diagnosis not present

## 2022-08-08 DIAGNOSIS — C4402 Squamous cell carcinoma of skin of lip: Secondary | ICD-10-CM

## 2022-08-08 LAB — RAD ONC ARIA SESSION SUMMARY
Course Elapsed Days: 7
Plan Fractions Treated to Date: 6
Plan Prescribed Dose Per Fraction: 2.5 Gy
Plan Total Fractions Prescribed: 20
Plan Total Prescribed Dose: 50 Gy
Reference Point Dosage Given to Date: 15 Gy
Reference Point Session Dosage Given: 2.5 Gy
Session Number: 6

## 2022-08-08 MED ORDER — SONAFINE EX EMUL
1.0000 | Freq: Once | CUTANEOUS | Status: AC
  Administered 2022-08-08: 1 via TOPICAL

## 2022-08-09 ENCOUNTER — Ambulatory Visit
Admission: RE | Admit: 2022-08-09 | Discharge: 2022-08-09 | Disposition: A | Discharge status: Home / Self Care | Payer: Commercial Managed Care - HMO | Source: Ambulatory Visit | Attending: Radiation Oncology | Admitting: Radiation Oncology

## 2022-08-09 ENCOUNTER — Telehealth: Payer: Self-pay

## 2022-08-09 ENCOUNTER — Other Ambulatory Visit: Payer: Self-pay

## 2022-08-09 DIAGNOSIS — Z510 Encounter for antineoplastic radiation therapy: Secondary | ICD-10-CM | POA: Diagnosis not present

## 2022-08-09 LAB — RAD ONC ARIA SESSION SUMMARY
Course Elapsed Days: 8
Plan Fractions Treated to Date: 7
Plan Prescribed Dose Per Fraction: 2.5 Gy
Plan Total Fractions Prescribed: 20
Plan Total Prescribed Dose: 50 Gy
Reference Point Dosage Given to Date: 17.5 Gy
Reference Point Session Dosage Given: 2.5 Gy
Session Number: 7

## 2022-08-09 NOTE — Telephone Encounter (Signed)
CSW attempted to contact patient to schedule a meeting after his radiation on Friday, 3/8, at 2:30.  Left vm.

## 2022-08-10 ENCOUNTER — Ambulatory Visit
Admission: RE | Admit: 2022-08-10 | Discharge: 2022-08-10 | Disposition: A | Discharge status: Home / Self Care | Payer: Commercial Managed Care - HMO | Source: Ambulatory Visit | Attending: Radiation Oncology | Admitting: Radiation Oncology

## 2022-08-10 ENCOUNTER — Other Ambulatory Visit: Payer: Self-pay

## 2022-08-10 DIAGNOSIS — Z510 Encounter for antineoplastic radiation therapy: Secondary | ICD-10-CM | POA: Diagnosis not present

## 2022-08-10 LAB — RAD ONC ARIA SESSION SUMMARY
Course Elapsed Days: 9
Plan Fractions Treated to Date: 8
Plan Prescribed Dose Per Fraction: 2.5 Gy
Plan Total Fractions Prescribed: 20
Plan Total Prescribed Dose: 50 Gy
Reference Point Dosage Given to Date: 20 Gy
Reference Point Session Dosage Given: 2.5 Gy
Session Number: 8

## 2022-08-11 ENCOUNTER — Other Ambulatory Visit: Payer: Self-pay

## 2022-08-11 ENCOUNTER — Ambulatory Visit
Admission: RE | Admit: 2022-08-11 | Discharge: 2022-08-11 | Disposition: A | Discharge status: Home / Self Care | Payer: Commercial Managed Care - HMO | Source: Ambulatory Visit | Attending: Radiation Oncology | Admitting: Radiation Oncology

## 2022-08-11 DIAGNOSIS — Z510 Encounter for antineoplastic radiation therapy: Secondary | ICD-10-CM | POA: Diagnosis not present

## 2022-08-11 LAB — RAD ONC ARIA SESSION SUMMARY
Course Elapsed Days: 10
Plan Fractions Treated to Date: 9
Plan Prescribed Dose Per Fraction: 2.5 Gy
Plan Total Fractions Prescribed: 20
Plan Total Prescribed Dose: 50 Gy
Reference Point Dosage Given to Date: 22.5 Gy
Reference Point Session Dosage Given: 2.5 Gy
Session Number: 9

## 2022-08-12 ENCOUNTER — Ambulatory Visit
Admission: RE | Admit: 2022-08-12 | Discharge: 2022-08-12 | Disposition: A | Discharge status: Home / Self Care | Payer: Commercial Managed Care - HMO | Source: Ambulatory Visit | Attending: Radiation Oncology | Admitting: Radiation Oncology

## 2022-08-12 ENCOUNTER — Other Ambulatory Visit: Payer: Self-pay

## 2022-08-12 ENCOUNTER — Inpatient Hospital Stay: Payer: Commercial Managed Care - HMO | Attending: Radiation Oncology

## 2022-08-12 DIAGNOSIS — Z510 Encounter for antineoplastic radiation therapy: Secondary | ICD-10-CM | POA: Diagnosis not present

## 2022-08-12 LAB — RAD ONC ARIA SESSION SUMMARY
Course Elapsed Days: 11
Plan Fractions Treated to Date: 10
Plan Prescribed Dose Per Fraction: 2.5 Gy
Plan Total Fractions Prescribed: 20
Plan Total Prescribed Dose: 50 Gy
Reference Point Dosage Given to Date: 25 Gy
Reference Point Session Dosage Given: 2.5 Gy
Session Number: 10

## 2022-08-12 NOTE — Progress Notes (Signed)
Johnathan Anderson Work  Initial [place] is a 57 y.o. year old male accompanied by spouse. Clinical Social Work was referred by medical provider for assessment of psychosocial needs.   SDOH (Social Determinants of Health) assessments performed: Yes SDOH Interventions    Flowsheet Row Clinical Support from 08/12/2022 in [place] at [facility]  SDOH Interventions   Financial Strain Interventions Financial Counselor       SDOH Screenings   Food Insecurity: No Food Insecurity (07/06/2022)  Housing: Low Risk  (07/06/2022)  Transportation Needs: No Transportation Needs (07/06/2022)  Utilities: Not At Risk (07/06/2022)  Depression (PHQ2-9): Low Risk  (07/06/2022)  Financial Resource Strain: Medium Risk (08/12/2022)  Tobacco Use: Medium Risk (07/07/2022)     Distress Screen completed: No     No data to display            Family/Social Information:  Housing Arrangement: Patient lives with his wife, Myrta, 65 y/o son, and patient's brother. Family members/support persons in your life? Family and Friends Transportation concerns: no  Employment: Unemployed.  Patient's wife is employed. Income source: Supported by [employer] and Friends Financial concerns: Yes, due to illness and/or loss of work during treatment Type of concernCommunity education officer (money for gas) Food access concerns: no Religious or spiritual practice: No Services Currently in place:  [employer]  Coping/ Adjustment to diagnosis: Patient understands treatment plan and what happens next? 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  Clinical Social Work Clinical Goal(s):  Explore community resource options for unmet needs related to:  Financial Strain   Interventions: Discussed common feeling and emotions when being diagnosed with cancer, and the importance of support during treatment Informed patient of the support team roles and support services at [facility] Provided [place] contact information and encouraged patient to call with any questions or concerns Provided patient with information about [employer] for medical bill assistance.  Gave patient two [employer].  Made referral to Claire Shown for the [employer].  Securely emailed contact information for [employer].   Follow Up Plan: Patient will contact CSW with any support or resource needs Patient verbalizes understanding of plan: Yes    Rodman Pickle Natelie Ostrosky, LCSW

## 2022-08-15 ENCOUNTER — Ambulatory Visit
Admission: RE | Admit: 2022-08-15 | Discharge: 2022-08-15 | Disposition: A | Discharge status: Home / Self Care | Payer: Commercial Managed Care - HMO | Source: Ambulatory Visit | Attending: Radiation Oncology | Admitting: Radiation Oncology

## 2022-08-15 ENCOUNTER — Other Ambulatory Visit: Payer: Self-pay

## 2022-08-15 DIAGNOSIS — Z510 Encounter for antineoplastic radiation therapy: Secondary | ICD-10-CM | POA: Diagnosis not present

## 2022-08-15 LAB — RAD ONC ARIA SESSION SUMMARY
Course Elapsed Days: 14
Plan Fractions Treated to Date: 11
Plan Prescribed Dose Per Fraction: 2.5 Gy
Plan Total Fractions Prescribed: 20
Plan Total Prescribed Dose: 50 Gy
Reference Point Dosage Given to Date: 27.5 Gy
Reference Point Session Dosage Given: 2.5 Gy
Session Number: 11

## 2022-08-16 ENCOUNTER — Ambulatory Visit
Admission: RE | Admit: 2022-08-16 | Discharge: 2022-08-16 | Disposition: A | Discharge status: Home / Self Care | Payer: Commercial Managed Care - HMO | Source: Ambulatory Visit | Attending: Radiation Oncology | Admitting: Radiation Oncology

## 2022-08-16 ENCOUNTER — Other Ambulatory Visit: Payer: Self-pay

## 2022-08-16 DIAGNOSIS — Z510 Encounter for antineoplastic radiation therapy: Secondary | ICD-10-CM | POA: Diagnosis not present

## 2022-08-16 LAB — RAD ONC ARIA SESSION SUMMARY
Course Elapsed Days: 15
Plan Fractions Treated to Date: 12
Plan Prescribed Dose Per Fraction: 2.5 Gy
Plan Total Fractions Prescribed: 20
Plan Total Prescribed Dose: 50 Gy
Reference Point Dosage Given to Date: 30 Gy
Reference Point Session Dosage Given: 2.5 Gy
Session Number: 12

## 2022-08-17 ENCOUNTER — Ambulatory Visit
Admission: RE | Admit: 2022-08-17 | Discharge: 2022-08-17 | Disposition: A | Discharge status: Home / Self Care | Payer: Commercial Managed Care - HMO | Source: Ambulatory Visit | Attending: Radiation Oncology | Admitting: Radiation Oncology

## 2022-08-17 ENCOUNTER — Other Ambulatory Visit: Payer: Self-pay

## 2022-08-17 DIAGNOSIS — Z510 Encounter for antineoplastic radiation therapy: Secondary | ICD-10-CM | POA: Diagnosis not present

## 2022-08-17 LAB — RAD ONC ARIA SESSION SUMMARY
Course Elapsed Days: 16
Plan Fractions Treated to Date: 13
Plan Prescribed Dose Per Fraction: 2.5 Gy
Plan Total Fractions Prescribed: 20
Plan Total Prescribed Dose: 50 Gy
Reference Point Dosage Given to Date: 32.5 Gy
Reference Point Session Dosage Given: 2.5 Gy
Session Number: 13

## 2022-08-18 ENCOUNTER — Ambulatory Visit
Admission: RE | Admit: 2022-08-18 | Discharge: 2022-08-18 | Disposition: A | Discharge status: Home / Self Care | Payer: Commercial Managed Care - HMO | Source: Ambulatory Visit | Attending: Radiation Oncology | Admitting: Radiation Oncology

## 2022-08-18 ENCOUNTER — Other Ambulatory Visit: Payer: Self-pay

## 2022-08-18 DIAGNOSIS — Z510 Encounter for antineoplastic radiation therapy: Secondary | ICD-10-CM | POA: Diagnosis not present

## 2022-08-18 LAB — RAD ONC ARIA SESSION SUMMARY
Course Elapsed Days: 17
Plan Fractions Treated to Date: 14
Plan Prescribed Dose Per Fraction: 2.5 Gy
Plan Total Fractions Prescribed: 20
Plan Total Prescribed Dose: 50 Gy
Reference Point Dosage Given to Date: 35 Gy
Reference Point Session Dosage Given: 2.5 Gy
Session Number: 14

## 2022-08-19 ENCOUNTER — Ambulatory Visit
Admission: RE | Admit: 2022-08-19 | Discharge: 2022-08-19 | Disposition: A | Discharge status: Home / Self Care | Payer: Commercial Managed Care - HMO | Source: Ambulatory Visit | Attending: Radiation Oncology | Admitting: Radiation Oncology

## 2022-08-19 ENCOUNTER — Other Ambulatory Visit: Payer: Self-pay

## 2022-08-19 DIAGNOSIS — Z510 Encounter for antineoplastic radiation therapy: Secondary | ICD-10-CM | POA: Diagnosis not present

## 2022-08-19 LAB — RAD ONC ARIA SESSION SUMMARY
Course Elapsed Days: 18
Plan Fractions Treated to Date: 15
Plan Prescribed Dose Per Fraction: 2.5 Gy
Plan Total Fractions Prescribed: 20
Plan Total Prescribed Dose: 50 Gy
Reference Point Dosage Given to Date: 37.5 Gy
Reference Point Session Dosage Given: 2.5 Gy
Session Number: 15

## 2022-08-22 ENCOUNTER — Ambulatory Visit
Admission: RE | Admit: 2022-08-22 | Discharge: 2022-08-22 | Disposition: A | Discharge status: Home / Self Care | Payer: Commercial Managed Care - HMO | Source: Ambulatory Visit | Attending: Radiation Oncology | Admitting: Radiation Oncology

## 2022-08-22 ENCOUNTER — Other Ambulatory Visit: Payer: Self-pay

## 2022-08-22 DIAGNOSIS — Z510 Encounter for antineoplastic radiation therapy: Secondary | ICD-10-CM | POA: Diagnosis not present

## 2022-08-22 LAB — RAD ONC ARIA SESSION SUMMARY
Course Elapsed Days: 21
Plan Fractions Treated to Date: 16
Plan Prescribed Dose Per Fraction: 2.5 Gy
Plan Total Fractions Prescribed: 20
Plan Total Prescribed Dose: 50 Gy
Reference Point Dosage Given to Date: 40 Gy
Reference Point Session Dosage Given: 2.5 Gy
Session Number: 16

## 2022-08-23 ENCOUNTER — Other Ambulatory Visit: Payer: Self-pay

## 2022-08-23 ENCOUNTER — Ambulatory Visit
Admission: RE | Admit: 2022-08-23 | Discharge: 2022-08-23 | Disposition: A | Discharge status: Home / Self Care | Payer: Commercial Managed Care - HMO | Source: Ambulatory Visit | Attending: Radiation Oncology | Admitting: Radiation Oncology

## 2022-08-23 DIAGNOSIS — Z510 Encounter for antineoplastic radiation therapy: Secondary | ICD-10-CM | POA: Diagnosis not present

## 2022-08-23 LAB — RAD ONC ARIA SESSION SUMMARY
Course Elapsed Days: 22
Plan Fractions Treated to Date: 17
Plan Prescribed Dose Per Fraction: 2.5 Gy
Plan Total Fractions Prescribed: 20
Plan Total Prescribed Dose: 50 Gy
Reference Point Dosage Given to Date: 42.5 Gy
Reference Point Session Dosage Given: 2.5 Gy
Session Number: 17

## 2022-08-24 ENCOUNTER — Other Ambulatory Visit: Payer: Self-pay

## 2022-08-24 ENCOUNTER — Ambulatory Visit
Admission: RE | Admit: 2022-08-24 | Discharge: 2022-08-24 | Disposition: A | Discharge status: Home / Self Care | Payer: Commercial Managed Care - HMO | Source: Ambulatory Visit | Attending: Radiation Oncology | Admitting: Radiation Oncology

## 2022-08-24 DIAGNOSIS — Z510 Encounter for antineoplastic radiation therapy: Secondary | ICD-10-CM | POA: Diagnosis not present

## 2022-08-24 LAB — RAD ONC ARIA SESSION SUMMARY
Course Elapsed Days: 23
Plan Fractions Treated to Date: 18
Plan Prescribed Dose Per Fraction: 2.5 Gy
Plan Total Fractions Prescribed: 20
Plan Total Prescribed Dose: 50 Gy
Reference Point Dosage Given to Date: 45 Gy
Reference Point Session Dosage Given: 2.5 Gy
Session Number: 18

## 2022-08-25 ENCOUNTER — Other Ambulatory Visit: Payer: Self-pay

## 2022-08-25 ENCOUNTER — Ambulatory Visit
Admission: RE | Admit: 2022-08-25 | Discharge: 2022-08-25 | Disposition: A | Discharge status: Home / Self Care | Payer: Commercial Managed Care - HMO | Source: Ambulatory Visit | Attending: Radiation Oncology | Admitting: Radiation Oncology

## 2022-08-25 DIAGNOSIS — Z510 Encounter for antineoplastic radiation therapy: Secondary | ICD-10-CM | POA: Diagnosis not present

## 2022-08-25 LAB — RAD ONC ARIA SESSION SUMMARY
Course Elapsed Days: 24
Plan Fractions Treated to Date: 19
Plan Prescribed Dose Per Fraction: 2.5 Gy
Plan Total Fractions Prescribed: 20
Plan Total Prescribed Dose: 50 Gy
Reference Point Dosage Given to Date: 47.5 Gy
Reference Point Session Dosage Given: 2.5 Gy
Session Number: 19

## 2022-08-26 ENCOUNTER — Other Ambulatory Visit: Payer: Self-pay

## 2022-08-26 ENCOUNTER — Ambulatory Visit
Admission: RE | Admit: 2022-08-26 | Discharge: 2022-08-26 | Disposition: A | Discharge status: Home / Self Care | Payer: Commercial Managed Care - HMO | Source: Ambulatory Visit | Attending: Radiation Oncology | Admitting: Radiation Oncology

## 2022-08-26 DIAGNOSIS — Z510 Encounter for antineoplastic radiation therapy: Secondary | ICD-10-CM | POA: Diagnosis not present

## 2022-08-26 LAB — RAD ONC ARIA SESSION SUMMARY
Course Elapsed Days: 25
Plan Fractions Treated to Date: 20
Plan Prescribed Dose Per Fraction: 2.5 Gy
Plan Total Fractions Prescribed: 20
Plan Total Prescribed Dose: 50 Gy
Reference Point Dosage Given to Date: 50 Gy
Reference Point Session Dosage Given: 2.5 Gy
Session Number: 20

## 2022-08-26 NOTE — Radiation Completion Notes (Signed)
Patient Name: Johnathan Anderson, Johnathan Anderson MRN: [id number] Date of Birth: 07-Nov-1965 Referring Physician: Karin Golden, M.D. Date of Service: 2022-08-26 Radiation Oncologist: Eppie Gibson, M.D. 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 Staging on 2022-07-06: Squamous cell carcinoma, lip T=pT3, N=pN0, M=cM0 Intent: Curative     ==========DELIVERED PLANS==========  First Treatment Date: 2022-08-01 - Last Treatment Date: 2022-08-26   Plan Name: HN_Low_Lip Site: Lip, Lower Technique: Electron Mode: Electron Dose Per Fraction: 2.5 Gy Prescribed Dose (Delivered / Prescribed): 50 Gy / 50 Gy Prescribed Fxs (Delivered / Prescribed): 20 / 20     ==========ON TREATMENT VISIT DATES========== 2022-08-01, 2022-08-08, 2022-08-15, 2022-08-22     ==========UPCOMING VISITS========== 2022-09-14 [facility] Wait List Werner Lean, MD; Collene Leyden, MD        ==========APPENDIX - ON TREATMENT VISIT NOTES==========   See weekly On Treatment Notes is Epic for details.

## 2022-08-29 ENCOUNTER — Inpatient Hospital Stay: Payer: Commercial Managed Care - HMO

## 2022-08-29 NOTE — Progress Notes (Signed)
[place] CSW Progress Note  Holiday representative met with patient per his request to assess needs.  Patient expressed increased financial strain, specifically food insecurity.  CSW provided a bag of food and his remaining two [employer].  He stated he is still having difficulty eating/drinking due to the nerve damage and radiation to his lip area.  He appears to be adjusting well and his wife is supportive.    Rodman Pickle Fausto Sampedro, LCSW

## 2022-09-07 NOTE — Telephone Encounter (Signed)
Encounter started in error.

## 2022-09-14 ENCOUNTER — Encounter: Payer: Self-pay | Admitting: Neurology

## 2022-09-14 ENCOUNTER — Ambulatory Visit: Payer: Commercial Managed Care - HMO | Admitting: Neurology

## 2022-09-14 VITALS — BP 135/85 | HR 56 | Ht 66.0 in | Wt 204.0 lb

## 2022-09-14 DIAGNOSIS — G43709 Chronic migraine without aura, not intractable, without status migrainosus: Secondary | ICD-10-CM | POA: Diagnosis not present

## 2022-09-14 MED ORDER — RIZATRIPTAN BENZOATE 10 MG PO TBDP: 10.0000 mg | ORAL_TABLET | ORAL | 11 refills | Status: DC | PRN

## 2022-09-14 MED ORDER — TOPIRAMATE 50 MG PO TABS: 50.0000 mg | ORAL_TABLET | Freq: Every evening | ORAL | 4 refills | Status: DC

## 2022-09-14 MED ORDER — ONDANSETRON 4 MG PO TBDP: 4.0000 mg | ORAL_TABLET | Freq: Three times a day (TID) | ORAL | 3 refills | Status: DC | PRN

## 2022-09-14 NOTE — Progress Notes (Signed)
[facility]    Provider:  Dr Lucia Gaskins Requesting Provider: Irven Coe, MD Primary Care Provider:  Irven Coe, MD  CC:  headache  09/14/2022: He has lip cancer, he had radiation, his lower lip, more often headaches, headaches are worse > 6 months. He has a lot of vertigo without his migraines. If he lays down and he turns to the left side it triggers. As a migraineur also more prone to getting migraines. He has 10-12 migraines in a month. > 15 headache days a month. Had bloodwork, all normal, kidneys are fine. Offered MRI brain, he declined. Gave him epley maneuvers. He did well on topiramate, we discussed other options but he wants to go back on topiramate for his migraines  Patient complains of symptoms per HPI as well as the following symptoms: migraines . Pertinent negatives and positives per HPI. All others negative  HPI:  Johnathan Anderson is a 57 y.o. male here as requested by Irven Coe, MD for headache vs TIA.PMHx vertigo.   In June he was working, he was walking, they went home and he started feeling a weird sensation in his head like dizziness, talking weird, his left arm went numb and left face went numb. He couldn't get the words out, dragging of his words. Went to the ED, they did not find anything abnormal, went home with continued symptoms. He gets symptoms with pressure in his forehead and eyeballs and ears and in the back of his head pressure. He feels numbness in the left arm and in the left check, usually all on the left side but headache is holocephalic, tylenol does not help, in between episodes symptoms go away but headaches and symptoms  can be triggered with motion, when traffic is passing by, or at night with the lights, can be bad when watching TV. He has an eye appointment upcoming to check his eyes. When he gets headache light bothers him, worse positionally and with movement, pulsating/pounding.throbbing,light sensitivity, sound sensitivity, he is smell  sensitive, nausea, he was using chlorox and it caused a headache one day for example, some nausea with the headaches. Also some dizziness/imbalance but not spinning. No other focal neurologic deficits, associated symptoms, inciting events or modifiable factors. >15 headache days a month and > 8 migraine days a month.   Reviewed notes, labs and imaging from outside physicians, which showed:  CT head 12/03/20: showed No acute intracranial abnormalities including mass lesion or mass effect, hydrocephalus, extra-axial fluid collection, midline shift, hemorrhage, or acute infarction, large ischemic events (personally reviewed images)    Tried: amitriptyline, propranolol contraindicated due to bradycardia, Topiramate, imitrex, rizatriptan  Review of Systems: Patient complains of symptoms per HPI as well as the following symptoms headaches. Pertinent negatives and positives per HPI. All others negative.   Social History   Socioeconomic History   Marital status: Married    Spouse name: Not on file   Number of children: Not on file   Years of education: Not on file   Highest education level: Not on file  Occupational History   Not on file  Tobacco Use   Smoking status: Former   Smokeless tobacco: Never  Substance and Sexual Activity   Alcohol use: No   Drug use: No   Sexual activity: Not on file  Other Topics Concern   Not on file  Social History Narrative   Not on file   Social Determinants of Health   Financial Resource Strain: Medium Risk (08/12/2022)   Overall Financial  Resource Strain (CARDIA)    Difficulty of Paying Living Expenses: Somewhat hard  Food Insecurity: No Food Insecurity (07/06/2022)   Hunger Vital Sign    Worried About Running Out of Food in the Last Year: Never true    Ran Out of Food in the Last Year: Never true  Transportation Needs: No Transportation Needs (07/06/2022)   PRAPARE - Administrator, Civil Service (Medical): No    Lack of Transportation  (Non-Medical): No  Physical Activity: Not on file  Stress: Not on file  Social Connections: Not on file  Intimate Partner Violence: Not At Risk (07/06/2022)   Humiliation, Afraid, Rape, and Kick questionnaire    Fear of Current or Ex-Partner: No    Emotionally Abused: No    Physically Abused: No    Sexually Abused: No    Family History  Problem Relation Age of Onset   Hypertension Mother    Diabetes Father    Migraines Neg Hx     Past Medical History:  Diagnosis Date   Aortic aneurysm    GERD (gastroesophageal reflux disease)    Hyperlipidemia    Vertigo     Patient Active Problem List   Diagnosis Date Noted   Chronic migraine without aura without status migrainosus, not intractable 09/14/2022   Essential hypertension 07/07/2022   Squamous cell carcinoma, 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   Migraine with aura and without status migrainosus, not intractable 02/19/2021   Thoracic aortic aneurysm without rupture 10/05/2020   Moderate aortic stenosis 10/05/2020   Bicuspid aortic valve 10/05/2020   Family history of aortic dissection 10/05/2020   Periodontitis 10/05/2020   Memory loss 02/24/2014   Chronic back pain 02/24/2014    Past Surgical History:  Procedure Laterality Date   APPENDECTOMY     EYE SURGERY     STERIOD INJECTION  09/16/2011   Procedure: MINOR STEROID INJECTION;  Surgeon: Mat Carne, MD;  Location: [facility];  Service: Orthopedics;  Laterality: Left;  Left Lumbar Five-Sacral One Transforaminal Epidural Steroid Injection   TEE WITHOUT CARDIOVERSION N/A 11/05/2020   Procedure: TRANSESOPHAGEAL ECHOCARDIOGRAM (TEE);  Surgeon: Christell Constant, MD;  [facility];  Service: Cardiovascular;  Laterality: N/A;    Current Outpatient Medications  Medication Sig Dispense Refill   amLODipine (NORVASC) 5 MG tablet Take 1 tablet (5 mg total) by mouth daily. 90 tablet 3   calcium carbonate (TUMS) 500 MG chewable tablet as needed for indigestion  or heartburn.     diclofenac Sodium (VOLTAREN) 1 % GEL Apply 1 application topically in the morning, at noon, and at bedtime.     omeprazole (PRILOSEC) 20 MG capsule Take 20 mg by mouth every morning.     ondansetron (ZOFRAN-ODT) 4 MG disintegrating tablet Take 1-2 tablets (4-8 mg total) by mouth every 8 (eight) hours as needed. 30 tablet 3   rizatriptan (MAXALT-MLT) 10 MG disintegrating tablet Take 1 tablet (10 mg total) by mouth as needed for migraine. May repeat in 2 hours if needed 9 tablet 11   rosuvastatin (CRESTOR) 10 MG tablet Take 1 tablet (10 mg total) by mouth daily. 90 tablet 3   topiramate (TOPAMAX) 50 MG tablet Take 1 tablet (50 mg total) by mouth at bedtime. 90 tablet 4   No current facility-administered medications for this visit.    Allergies as of 09/14/2022 - Review Complete 09/14/2022  Allergen Reaction Noted   Levaquin [levofloxacin]  10/05/2020    Vitals: BP 135/85   Pulse (!) 56  Ht 5\' 6"  (1.676 m)   Wt 204 lb (92.5 kg)   BMI 32.93 kg/m  Last Weight:  Wt Readings from Last 1 Encounters:  09/14/22 204 lb (92.5 kg)   Last Height:   Ht Readings from Last 1 Encounters:  09/14/22 5\' 6"  (1.676 m)   Exam: NAD, pleasant                  Speech:    Speech is normal; fluent and spontaneous with normal comprehension.  Cognition:    The patient is oriented to person, place, and time;     recent and remote memory intact;     language fluent;    Cranial Nerves:    The pupils are equal, round, and reactive to light.Trigeminal sensation is intact and the muscles of mastication are normal. The face is symmetric. The palate elevates in the midline. Hearing intact. Voice is normal. Shoulder shrug is normal. The tongue has normal motion without fasciculations.   Coordination:  No dysmetria  Motor Observation:    No asymmetry, no atrophy, and no involuntary movements noted. Tone:    Normal muscle tone.     Strength:    Strength is V/V in the upper and lower  limbs.      Sensation: intact to LT   Assessment/Plan:  Patient with stereotyped episodes of migrainous headaches with left arm and face sensory symptoms, speech problems. Sounds like migraine with aura but need to rule out other causes such as stroke, lesions, demyelination, compressive masses, seizure focus, or other, CT was neg but needs MRI brain. Failed amitriptyline. Cannot use propranolol due to bradycardia. Try topiramate. Next can try a CGRP medication. Needs MRI brain to ensure no other causes such as stroke, mass, demyelinating lesion or other.  09/14/2022: He has lip cancer, he had radiation, his lower lip, more often headaches, headaches are worse > 6 months. He has a lot of vertigo without his migraines. If he lays down and he turns to the left side it triggers. As a migraineur also more prone to getting migraines. He has 10-12 migraines in a month. > 15 headache days a month. Had bloodwork, all normal, kidneys are fine. Offered MRI brain, he declined. Gave him epley maneuvers. He did well on topiramate, we discussed other options but he wants to go back on topiramate for his migraines  Restart Topiramate Rizatriptan: Please take one tablet at the onset of your headache. If it does not improve the symptoms please take one additional tablet. Do not take more then 2 tablets in 24hrs. Do not take use more then 2 to 3 times in a week. If medication above is not working I recommend trying newer migraine medications such as Emgality or Ajovy Consider sleep apnea testing, talk to partner about snoring    Meds ordered this encounter  Medications   rizatriptan (MAXALT-MLT) 10 MG disintegrating tablet    Sig: Take 1 tablet (10 mg total) by mouth as needed for migraine. May repeat in 2 hours if needed    Dispense:  9 tablet    Refill:  11   ondansetron (ZOFRAN-ODT) 4 MG disintegrating tablet    Sig: Take 1-2 tablets (4-8 mg total) by mouth every 8 (eight) hours as needed.    Dispense:  30  tablet    Refill:  3   topiramate (TOPAMAX) 50 MG tablet    Sig: Take 1 tablet (50 mg total) by mouth at bedtime.    Dispense:  90  tablet    Refill:  4     Cc: Irven CoeHammer, Eli, MD,  Irven CoeHammer, Eli, MD  Naomie DeanAntonia Abbrielle Batts, MD  [facility] [address] [place], [place] [id number]  Phone [phone] Fax [phone] I spent 25 minutes of face-to-face and non-face-to-face time with patient on the  1. Chronic migraine without aura without status migrainosus, not intractable    diagnosis.  This included previsit chart review, lab review, study review, order entry, electronic health record documentation, patient education on the different diagnostic and therapeutic options, counseling and coordination of care, risks and benefits of management, compliance, or risk factor reduction

## 2022-09-14 NOTE — Patient Instructions (Signed)
Topiramate: prevention every night When you have a migraine: Rizatriptan and Ondansetron and repeat in 2 hours if needed Nausea: Ondansetron alone  Meds ordered this encounter  Medications   rizatriptan (MAXALT-MLT) 10 MG disintegrating tablet    Sig: Take 1 tablet (10 mg total) by mouth as needed for migraine. May repeat in 2 hours if needed    Dispense:  9 tablet    Refill:  11   ondansetron (ZOFRAN-ODT) 4 MG disintegrating tablet    Sig: Take 1-2 tablets (4-8 mg total) by mouth every 8 (eight) hours as needed.    Dispense:  30 tablet    Refill:  3   topiramate (TOPAMAX) 50 MG tablet    Sig: Take 1 tablet (50 mg total) by mouth at bedtime.    Dispense:  90 tablet    Refill:  4     Next try Ajovy, Emgality, Qulipta, Nurtec, Ubrelvy   Rizatriptan Disintegrating Tablets What is this medication? RIZATRIPTAN (rye za TRIP tan) treats migraines. It works by blocking pain signals and narrowing blood vessels in the brain. It belongs to a group of medications called triptans. It is not used to prevent migraines. This medicine may be used for other purposes; ask your health care provider or pharmacist if you have questions. COMMON BRAND NAME(S): Maxalt-MLT What should I tell my care team before I take this medication? They need to know if you have any of these conditions: Circulation problems in fingers and toes Diabetes Heart disease High blood pressure High cholesterol History of irregular heartbeat History of stroke Stomach or intestine problems Tobacco use An unusual or allergic reaction to rizatriptan, other medications, foods, dyes, or preservatives Pregnant or trying to get pregnant Breast-feeding How should I use this medication? Take this medication by mouth. Take it as directed on the prescription label. You do not need water to take this medication. Leave the tablet in the sealed pack until you are ready to take it. With dry hands, open the pack and gently remove the  tablet. If the tablet breaks or crumbles, throw it away. Use a new tablet. Place the tablet on the tongue and allow it to dissolve. Then, swallow it. Do not cut, crush, or chew this medication. Do not use it more often than directed. Talk to your care team about the use of this medication in children. While it may be prescribed for children as young as 6 years for selected conditions, precautions do apply. Overdosage: If you think you have taken too much of this medicine contact a poison control center or emergency room at once. NOTE: This medicine is only for you. Do not share this medicine with others. What if I miss a dose? This does not apply. This medication is not for regular use. What may interact with this medication? Do not take this medication with any of the following: Ergot alkaloids, such as dihydroergotamine, ergotamine MAOIs, such as Marplan, Nardil, Parnate Other medications for migraine headache, such as almotriptan, eletriptan, frovatriptan, naratriptan, sumatriptan, zolmitriptan This medication may also interact with the following: Certain medications for depression, anxiety, or other mental health conditions Propranolol This list may not describe all possible interactions. Give your health care provider a list of all the medicines, herbs, non-prescription drugs, or dietary supplements you use. Also tell them if you smoke, drink alcohol, or use illegal drugs. Some items may interact with your medicine. What should I watch for while using this medication? Visit your care team for regular checks on your  progress. Tell your care team if your symptoms do not start to get better or if they get worse. This medication may affect your coordination, reaction time, or judgment. Do not drive or operate machinery until you know how this medication affects you. Sit up or stand slowly to reduce the risk of dizzy or fainting spells. If you take migraine medications for 10 or more days a month,  your migraines may get worse. Keep a diary of headache days and medication use. Contact your care team if your migraine attacks occur more frequently. What side effects may I notice from receiving this medication? Side effects that you should report to your care team as soon as possible: Allergic reactions--skin rash, itching, hives, swelling of the face, lips, tongue, or throat Burning, pain, tingling, or color changes in the hands, arms, legs, or feet Heart attack--pain or tightness in the chest, shoulders, arms, or jaw, nausea, shortness of breath, cold or clammy skin, feeling faint or lightheaded Heart rhythm changes--fast or irregular heartbeat, dizziness, feeling faint or lightheaded, chest pain, trouble breathing Increase in blood pressure Irritability, confusion, fast or irregular heartbeat, muscle stiffness, twitching muscles, sweating, high fever, seizure, chills, vomiting, diarrhea, which may be signs of serotonin syndrome Raynaud syndrome--cool, numb, or painful fingers or toes that may change color from pale, to blue, to red Seizures Stroke--sudden numbness or weakness of the face, arm, or leg, trouble speaking, confusion, trouble walking, loss of balance or coordination, dizziness, severe headache, change in vision Sudden or severe stomach pain, bloody diarrhea, fever, nausea, vomiting Vision loss Side effects that usually do not require medical attention (report to your care team if they continue or are bothersome): Dizziness Unusual weakness or fatigue This list may not describe all possible side effects. Call your doctor for medical advice about side effects. You may report side effects to FDA at 1-800-FDA-1088. Where should I keep my medication? Keep out of the reach of children and pets. Store at room temperature between 15 and 30 degrees C (59 and 86 degrees F). Protect from light and moisture. Get rid of any unused medication after the expiration date. To get rid of  medications that are no longer needed or have expired: Take the medication to a medication take-back program. Check with your pharmacy or law enforcement to find a location. If you cannot return the medication, check the label or package insert to see if the medication should be thrown out in the garbage or flushed down the toilet. If you are not sure, ask your care team. If it is safe to put it in the trash, empty the medication out of the container. Mix the medication with cat litter, dirt, coffee grounds, or other unwanted substance. Seal the mixture in a bag or container. Put it in the trash. NOTE: This sheet is a summary. It may not cover all possible information. If you have questions about this medicine, talk to your doctor, pharmacist, or health care provider.  2023 Elsevier/Gold Standard (2021-09-23 00:00:00) Ondansetron Dissolving Tablets What is this medication? ONDANSETRON (on DAN se tron) prevents nausea and vomiting from chemotherapy, radiation, or surgery. It works by blocking substances in the body that may cause nausea or vomiting. It belongs to a group of medications called antiemetics. This medicine may be used for other purposes; ask your health care provider or pharmacist if you have questions. COMMON BRAND NAME(S): Zofran ODT What should I tell my care team before I take this medication? They need to know if you  have any of these conditions: Heart disease History of irregular heartbeat Liver disease Low levels of magnesium or potassium in the blood An unusual or allergic reaction to ondansetron, granisetron, other medications, foods, dyes, or preservatives Pregnant or trying to get pregnant Breast-feeding How should I use this medication? These tablets are made to dissolve in the mouth. Do not try to push the tablet through the foil backing. With dry hands, peel away the foil backing and gently remove the tablet. Place the tablet in the mouth and allow it to dissolve, then  swallow. While you may take these tablets with water, it is not necessary to do so. Talk to your care team regarding the use of this medication in children. Special care may be needed. Overdosage: If you think you have taken too much of this medicine contact a poison control center or emergency room at once. NOTE: This medicine is only for you. Do not share this medicine with others. What if I miss a dose? If you miss a dose, take it as soon as you can. If it is almost time for your next dose, take only that dose. Do not take double or extra doses. What may interact with this medication? Do not take this medication with any of the following: Apomorphine Certain medications for fungal infections like fluconazole, itraconazole, ketoconazole, posaconazole, voriconazole Cisapride Dronedarone Pimozide Thioridazine This medication may also interact with the following: Carbamazepine Certain medications for depression, anxiety, or psychotic disturbances Fentanyl Linezolid MAOIs like Carbex, Eldepryl, Marplan, Nardil, and Parnate Methylene blue (injected into a vein) Other medications that prolong the QT interval (cause an abnormal heart rhythm) like dofetilide, ziprasidone Phenytoin Rifampicin Tramadol This list may not describe all possible interactions. Give your health care provider a list of all the medicines, herbs, non-prescription drugs, or dietary supplements you use. Also tell them if you smoke, drink alcohol, or use illegal drugs. Some items may interact with your medicine. What should I watch for while using this medication? Check with your care team as soon as you can if you have any sign of an allergic reaction. What side effects may I notice from receiving this medication? Side effects that you should report to your care team as soon as possible: Allergic reactions--skin rash, itching, hives, swelling of the face, lips, tongue, or throat Bowel blockage--stomach cramping, unable  to have a bowel movement or pass gas, loss of appetite, vomiting Chest pain (angina)--pain, pressure, or tightness in the chest, neck, back, or arms Heart rhythm changes--fast or irregular heartbeat, dizziness, feeling faint or lightheaded, chest pain, trouble breathing Irritability, confusion, fast or irregular heartbeat, muscle stiffness, twitching muscles, sweating, high fever, seizure, chills, vomiting, diarrhea, which may be signs of serotonin syndrome Side effects that usually do not require medical attention (report to your care team if they continue or are bothersome): Constipation Diarrhea General discomfort and fatigue Headache This list may not describe all possible side effects. Call your doctor for medical advice about side effects. You may report side effects to FDA at 1-800-FDA-1088. Where should I keep my medication? Keep out of the reach of children and pets. Store between 2 and 30 degrees C (36 and 86 degrees F). Throw away any unused medication after the expiration date. NOTE: This sheet is a summary. It may not cover all possible information. If you have questions about this medicine, talk to your doctor, pharmacist, or health care provider.  2023 Elsevier/Gold Standard (2007-07-14 00:00:00)   Topiramate Tablets What is this medication? TOPIRAMATE (  toe PYRE a mate) prevents and controls seizures in people with epilepsy. It may also be used to prevent migraine headaches. It works by calming overactive nerves in your body. This medicine may be used for other purposes; ask your health care provider or pharmacist if you have questions. COMMON BRAND NAME(S): Topamax, Topiragen What should I tell my care team before I take this medication? They need to know if you have any of these conditions: Bleeding disorder Kidney disease Lung disease Suicidal thoughts, plans, or attempt by you or a family member An unusual or allergic reaction to topiramate, other medications, foods,  dyes, or preservatives Pregnant or trying to get pregnant Breast-feeding How should I use this medication? Take this medication by mouth with water. Take it as directed on the prescription label at the same time every day. Do not cut, crush or chew this medicine. Swallow the tablets whole. You can take it with or without food. If it upsets your stomach, take it with food. Keep taking it unless your care team tells you to stop. A special MedGuide will be given to you by the pharmacist with each prescription and refill. Be sure to read this information carefully each time. Talk to your care team about the use of this medication in children. While it may be prescribed for children as young as 2 years for selected conditions, precautions do apply. Overdosage: If you think you have taken too much of this medicine contact a poison control center or emergency room at once. NOTE: This medicine is only for you. Do not share this medicine with others. What if I miss a dose? If you miss a dose, take it as soon as you can unless it is within 6 hours of the next dose. If it is within 6 hours of the next dose, skip the missed dose. Take the next dose at the normal time. Do not take double or extra doses. What may interact with this medication? Acetazolamide Alcohol Antihistamines for allergy, cough, and cold Aspirin and aspirin-like medications Atropine Certain medications for anxiety or sleep Certain medications for bladder problems, such as oxybutynin, tolterodine Certain medications for depression, such as amitriptyline, fluoxetine, sertraline Certain medications for Parkinson disease, such as benztropine, trihexyphenidyl Certain medications for seizures, such as carbamazepine, lamotrigine, phenobarbital, phenytoin, primidone, valproic acid, zonisamide Certain medications for stomach problems, such as dicyclomine, hyoscyamine Certain medications for travel sickness, such as scopolamine Certain  medications that treat or prevent blood clots, such as warfarin, enoxaparin, dalteparin, apixaban, dabigatran, rivaroxaban Digoxin Diltiazem Estrogen and progestin hormones General anesthetics, such as halothane, isoflurane, methoxyflurane, propofol Glyburide Hydrochlorothiazide Ipratropium Lithium Medications that relax muscles Metformin NSAIDs, medications for pain and inflammation, such as ibuprofen or naproxen Opioid medications for pain Phenothiazines, such as chlorpromazine, mesoridazine, prochlorperazine, thioridazine Pioglitazone This list may not describe all possible interactions. Give your health care provider a list of all the medicines, herbs, non-prescription drugs, or dietary supplements you use. Also tell them if you smoke, drink alcohol, or use illegal drugs. Some items may interact with your medicine. What should I watch for while using this medication? Visit your care team for regular checks on your progress. Tell your care team if your symptoms do not start to get better or if they get worse. Do not suddenly stop taking this medication. You may develop a severe reaction. Your care team will tell you how much medication to take. If your care team wants you to stop the medication, the dose may be slowly lowered  over time to avoid any side effects. Wear a medical ID bracelet or chain. Carry a card that describes your condition. List the medications and doses you take on the card. This medication may affect your coordination, reaction time, or judgment. Do not drive or operate machinery until you know how this medication affects you. Sit up or stand slowly to reduce the risk of dizzy or fainting spells. Drinking alcohol with this medication can increase the risk of these side effects. This medication may cause serious skin reactions. They can happen weeks to months after starting the medication. Contact your care team right away if you notice fevers or flu-like symptoms with a  rash. The rash may be red or purple and then turn into blisters or peeling of the skin. You may also notice a red rash with swelling of the face, lips, or lymph nodes in your neck or under your arms. This medication may cause thoughts of suicide or depression. This includes sudden changes in mood, behaviors, or thoughts. These changes can happen at any time but are more common in the beginning of treatment or after a change in dose. Call your care team right away if you experience these thoughts or worsening depression. This medication may slow your child's growth if it is taken for a long time at high doses. Your child's care team will monitor your child's growth. Using this medication for a long time may weaken your bones. The risk of bone fractures may be increased. Talk to your care team about your bone health. Discuss this medication with your care team if you may be pregnant. Serious birth defects can occur if you take this medication during pregnancy. There are benefits and risks to taking medications during pregnancy. Your care team can help you find the option that works for you. Contraception is recommended while taking this medication. Estrogen and progestin hormones may not work as well while you are taking this medication. Your care team can help you find the option that works for you. Talk to your care team before breastfeeding. Changes to your treatment plan may be needed. What side effects may I notice from receiving this medication? Side effects that you should report to your care team as soon as possible: Allergic reactions--skin rash, itching, hives, swelling of the face, lips, tongue, or throat High acid level--trouble breathing, unusual weakness or fatigue, confusion, headache, fast or irregular heartbeat, nausea, vomiting High ammonia level--unusual weakness or fatigue, confusion, loss of appetite, nausea, vomiting, seizures Fever that does not go away, decrease in sweat Kidney  stones--blood in the urine, pain or trouble passing urine, pain in the lower back or sides Redness, blistering, peeling or loosening of the skin, including inside the mouth Sudden eye pain or change in vision such as blurry vision, seeing halos around lights, vision loss Thoughts of suicide or self-harm, worsening mood, feelings of depression Side effects that usually do not require medical attention (report to your care team if they continue or are bothersome): Burning or tingling sensation in hands or feet Difficulty with paying attention, memory, or speech Dizziness Drowsiness Fatigue Loss of appetite with weight loss Slow or sluggish movements of the body This list may not describe all possible side effects. Call your doctor for medical advice about side effects. You may report side effects to FDA at 1-800-FDA-1088. Where should I keep my medication? Keep out of the reach of children and pets. Store between 15 and 30 degrees C (59 and 86 degrees F). Protect  from moisture. Keep the container tightly closed. Get rid of any unused medication after the expiration date. To get rid of medications that are no longer needed or have expired: Take the medication to a medication take-back program. Check with your pharmacy or law enforcement to find a location. If you cannot return the medication, check the label or package insert to see if the medication should be thrown out in the garbage or flushed down the toilet. If you are not sure, ask your care team. If it is safe to put it in the trash, empty the medication out of the container. Mix the medication with cat litter, dirt, coffee grounds, or other unwanted substance. Seal the mixture in a bag or container. Put it in the trash. NOTE: This sheet is a summary. It may not cover all possible information. If you have questions about this medicine, talk to your doctor, pharmacist, or health care provider.  2023 Elsevier/Gold Standard (2021-10-12  00:00:00)

## 2022-09-16 NOTE — Progress Notes (Signed)
Mr. 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 He completed radiation on 08-26-22.   Pain issues, if any: Reports pain to chin (especially when area is touched) and tenderness to lower gumline behind lower lip Using a feeding tube?: N/A Weight changes, if any:  Wt Readings from Last 3 Encounters:  09/27/22 200 lb 12.8 oz (91.1 kg)  09/14/22 204 lb (92.5 kg)  07/07/22 202 lb 9.6 oz (91.9 kg)   Swallowing issues, if any: Denies--reports he can eat/drink a wide variety.  Smoking or chewing tobacco? None Using fluoride trays daily? N/A--denies any mouth sores or dental concerns Last ENT visit was on: N/A Other notable issues, if any: Overall reports he's doing well. He is concerned that his lip incision has not completely closed. He is interested in knowing if it will eventually close fully with time or if he'll need additional surgery

## 2022-09-27 ENCOUNTER — Ambulatory Visit
Admission: RE | Admit: 2022-09-27 | Discharge: 2022-09-27 | Disposition: A | Discharge status: Home / Self Care | Payer: Commercial Managed Care - HMO | Source: Ambulatory Visit | Attending: Radiation Oncology | Admitting: Radiation Oncology

## 2022-09-27 VITALS — BP 138/84 | HR 55 | Temp 97.7°F | Resp 18 | Ht 66.0 in | Wt 200.8 lb

## 2022-09-27 DIAGNOSIS — Z79899 Other long term (current) drug therapy: Secondary | ICD-10-CM | POA: Diagnosis not present

## 2022-09-27 DIAGNOSIS — C4402 Squamous cell carcinoma of skin of lip: Secondary | ICD-10-CM | POA: Diagnosis not present

## 2022-09-27 DIAGNOSIS — Z923 Personal history of irradiation: Secondary | ICD-10-CM | POA: Diagnosis not present

## 2022-09-27 NOTE — Progress Notes (Signed)
Radiation Oncology         ([phone]) [phone] ________________________________  Name: Johnathan Anderson MRN: [id number]  Date: 09/27/2022  DOB: 05/31/1966  Follow-Up Visit Note  CC: Irven Coe, MD  Glennis Brink, MD  Diagnosis and Prior Radiotherapy:       ICD-10-CM   1. 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 Amb Referral to Survivorship Program     Cancer Staging  Squamous cell carcinoma, lip Staging form: Cutaneous Carcinoma of the Head and Neck, AJCC 8th Edition - Pathologic stage from 07/06/2022: Stage III (pT3, pN0, cM0) - Signed by Lonie Peak, MD on 07/06/2022 Stage prefix: Initial diagnosis Extraosseous extension: Absent   ==========DELIVERED PLANS==========  First Treatment Date: 2022-08-01 - Last Treatment Date: 2022-08-26   Plan Name: HN_Low_Lip Site: Lip, Lower Technique: Electron Mode: Electron Dose Per Fraction: 2.5 Gy Prescribed Dose (Delivered / Prescribed): 50 Gy / 50 Gy Prescribed Fxs (Delivered / Prescribed): 20 / 20              CHIEF COMPLAINT:  Here for follow-up and surveillance of 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  It has been 1 month since he completed his radiation.    Mr. Kersh presents to clinic today for follow up after completing post-operative radiation treatment for cancer of his lip. He completed radiation on 08-26-22.   Pain issues, if any: Reports pain to chin (especially when area is touched) and tenderness to lower gumline behind lower lip Using a feeding tube?: N/A Weight changes, if any:  Wt Readings from Last 3 Encounters:  09/27/22 200 lb 12.8 oz (91.1 kg)  09/14/22 204 lb (92.5 kg)  07/07/22 202 lb 9.6 oz (91.9 kg)   Swallowing issues, if any: Denies--reports he can eat/drink a wide variety.  Smoking or chewing tobacco? None Using fluoride trays daily? N/A--denies any mouth sores or dental concerns Last ENT visit was on: N/A Other notable  issues, if any: Overall reports he's doing well. He is concerned that his lip incision has not completely closed. He is interested in knowing if it will eventually close fully with time or if he'll need additional surgery     ALLERGIES:  is allergic to levaquin [levofloxacin].  Meds: Current Outpatient Medications  Medication Sig Dispense Refill   amLODipine (NORVASC) 5 MG tablet Take 1 tablet (5 mg total) by mouth daily. 90 tablet 3   calcium carbonate (TUMS) 500 MG chewable tablet as needed for indigestion or heartburn.     diclofenac Sodium (VOLTAREN) 1 % GEL Apply 1 application topically in the morning, at noon, and at bedtime.     omeprazole (PRILOSEC) 20 MG capsule Take 20 mg by mouth every morning.     ondansetron (ZOFRAN-ODT) 4 MG disintegrating tablet Take 1-2 tablets (4-8 mg total) by mouth every 8 (eight) hours as needed. 30 tablet 3   rizatriptan (MAXALT-MLT) 10 MG disintegrating tablet Take 1 tablet (10 mg total) by mouth as needed for migraine. May repeat in 2 hours if needed 9 tablet 11   rosuvastatin (CRESTOR) 10 MG tablet Take 1 tablet (10 mg total) by mouth daily. 90 tablet 3   topiramate (TOPAMAX) 50 MG tablet Take 1 tablet (50 mg total) by mouth at bedtime. 90 tablet 4   No current facility-administered medications for this encounter.    Physical Findings: The patient is in no acute distress. Patient is alert and oriented. Wt Readings from Last 3  Encounters:  09/27/22 200 lb 12.8 oz (91.1 kg)  09/14/22 204 lb (92.5 kg)  07/07/22 202 lb 9.6 oz (91.9 kg)    height is  (1.676 m) and weight is 200 lb 12.8 oz (91.1 kg). His temperature is 97.7 F (36.5 C). His blood pressure is 138/84 and his pulse is 55 (abnormal). His respiration is 18 and oxygen saturation is 100%. .  General: Alert and oriented, in no acute distress HEENT: Head is normocephalic. Extraocular movements are intact. Lip is healing well - lower lip still has some resolving dry desquamation and the  buccal mucosa at the incision site is still healing from radiation exposure, and tender, but intact. There is a small concavity in the lower lip at the incision site Neck: Neck is notable for no masses Skin: Skin in treatment fields shows satisfactory healing  Lymphatics: see Neck Exam Psychiatric: Judgment and insight are intact. Affect is appropriate.   Lab Findings: Lab Results  Component Value Date   WBC 9.2 12/03/2020   HGB 14.6 12/03/2020   HCT 42.0 12/03/2020   MCV 85.7 12/03/2020   PLT 164 12/03/2020    No results found for: "TSH"  Radiographic Findings: No results found.  Impression/Plan:    1) Head and Neck Cancer Status: healing well from post op RT to the lower lip, NED  2) Nutritional Status: no issues Wt Readings from Last 3 Encounters:  09/27/22 200 lb 12.8 oz (91.1 kg)  09/14/22 204 lb (92.5 kg)  07/07/22 202 lb 9.6 oz (91.9 kg)     3) Risk factors: We discussed sun hygiene today and the importance of continuing skin checks with his dermatologist. Denies Smoking or chewing tobacco.  4) Follow-up in 74mo with H+N survivorship. He will benefit from physical exams of mouth and neck and only needs imaging as clinically indicated. Lab work only as clinically indicated (thyroid gland not exposed).  If he is still drooling due to anatomic changes in lip, referral to plastic surgeon may be considered in 74mo.  He is pleased with this plan, and knows I will see him back PRN.  On date of service, in total, I spent 25 minutes on this encounter. Patient was seen in person. _____________________________________   Joyice Faster, PA-C    Lonie Peak, MD

## 2022-09-29 ENCOUNTER — Telehealth: Payer: Self-pay | Admitting: Adult Health

## 2022-09-29 DIAGNOSIS — Z0271 Encounter for disability determination: Secondary | ICD-10-CM

## 2022-09-29 NOTE — Telephone Encounter (Signed)
Left patient a vm regarding upcoming appointment  

## 2022-12-05 ENCOUNTER — Ambulatory Visit (HOSPITAL_COMMUNITY): Payer: Commercial Managed Care - HMO | Attending: Internal Medicine

## 2022-12-05 DIAGNOSIS — I088 Other rheumatic multiple valve diseases: Secondary | ICD-10-CM | POA: Diagnosis not present

## 2022-12-05 DIAGNOSIS — I350 Nonrheumatic aortic (valve) stenosis: Secondary | ICD-10-CM | POA: Insufficient documentation

## 2022-12-05 DIAGNOSIS — I7121 Aneurysm of the ascending aorta, without rupture: Secondary | ICD-10-CM | POA: Diagnosis not present

## 2022-12-05 DIAGNOSIS — Q231 Congenital insufficiency of aortic valve: Secondary | ICD-10-CM | POA: Diagnosis present

## 2022-12-05 DIAGNOSIS — I77810 Thoracic aortic ectasia: Secondary | ICD-10-CM | POA: Diagnosis not present

## 2022-12-05 LAB — ECHOCARDIOGRAM COMPLETE
AR max vel: 0.76 cm2
AV Area VTI: 0.83 cm2
AV Area mean vel: 0.75 cm2
AV Mean grad: 39 mmHg
AV Peak grad: 65.3 mmHg
Ao pk vel: 4.04 m/s
Area-P 1/2: 2.33 cm2
S' Lateral: 2.6 cm

## 2022-12-06 ENCOUNTER — Telehealth: Payer: Self-pay | Admitting: Internal Medicine

## 2022-12-06 NOTE — Telephone Encounter (Signed)
Left a message to call back.

## 2022-12-06 NOTE — Telephone Encounter (Signed)
Called Patient - further increase in AV gradients.  Normal LVEF.   - he has no symptoms.  His squamous cell radiation went well. - Offer Overbook on 12/26/22.  Will plan for labs and EKG and consent for LHC.  Will plan for patient to see [facility] for SAV[facility] consideration in August   Patient had no further questions.  Riley Lam, MD Cardiologist [facility]  [address] [place], [address] [place], [place] [address] [phone]  8:34 AM

## 2022-12-06 NOTE — Telephone Encounter (Signed)
Pt called back in was scheduler set up next OV with Dr. Izora Ribas for 7/22 at 10 am.

## 2022-12-25 NOTE — Progress Notes (Signed)
Cardiology Office Note:    Date:  12/26/2022   ID:  Johnathan Anderson, DOB 01/10/1966, MRN [id number]  PCP:  Johnathan Coe, MD   [facility]  Cardiologist:  Johnathan Constant, MD  Advanced Practice Provider:  No care team member to display Electrophysiologist:  None   CC: Follow up AS and TAA  History of Present Illness:    Johnathan Anderson is a 57 y.o. male with a hx of recent echocardiogram 08/14/20 who presents for evaluation 10/04/20.  In interim of this visit, patient had TEE and Cardiac CT- minimal CAC, Bicuspid Aortic valve with moderate aortic aneurysm. Has 2022 imaging with stable valve disease and aortic size.  Seen 06/14/21. 2023: Moderate to severe aortic stenosis. 2024, further increase in his aortic gradients.  Patient notes that he is doing well.   Since last visit notes aortic valve gradients have progressed . There are no interval hospital/ED visit.   EKG showed sinus bradycardia; LVH is not as prominent on this exam.  No chest pain or pressure .  No SOB/DOE and no PND/Orthopnea.  No weight gain or leg swelling.  No palpitations or syncope.  Has questions.   Past Surgical History:  Procedure Laterality Date   APPENDECTOMY     EYE SURGERY     STERIOD INJECTION  09/16/2011   Procedure: MINOR STEROID INJECTION;  Surgeon: Johnathan Carne, MD;  Location: [facility];  Service: Orthopedics;  Laterality: Left;  Left Lumbar Five-Sacral One Transforaminal Epidural Steroid Injection   TEE WITHOUT CARDIOVERSION N/A 11/05/2020   Procedure: TRANSESOPHAGEAL ECHOCARDIOGRAM (TEE);  Surgeon: Johnathan Constant, MD;  Location: [facility];  Service: Cardiovascular;  Laterality: N/A;    Current Medications: Current Meds  Medication Sig   amLODipine (NORVASC) 5 MG tablet Take 1 tablet (5 mg total) by mouth daily.   calcium carbonate (TUMS) 500 MG chewable tablet as needed for indigestion or heartburn.   diclofenac Sodium (VOLTAREN) 1 % GEL  Apply 1 application topically in the morning, at noon, and at bedtime.   omeprazole (PRILOSEC) 20 MG capsule Take 20 mg by mouth every morning.   ondansetron (ZOFRAN-ODT) 4 MG disintegrating tablet Take 1-2 tablets (4-8 mg total) by mouth every 8 (eight) hours as needed.   rizatriptan (MAXALT-MLT) 10 MG disintegrating tablet Take 1 tablet (10 mg total) by mouth as needed for migraine. May repeat in 2 hours if needed   rosuvastatin (CRESTOR) 10 MG tablet Take 1 tablet (10 mg total) by mouth daily.   topiramate (TOPAMAX) 50 MG tablet Take 1 tablet (50 mg total) by mouth at bedtime.     Allergies:   Levaquin [levofloxacin]   Social History   Socioeconomic History   Marital status: Married    Spouse name: Not on file   Number of children: Not on file   Years of education: Not on file   Highest education level: Not on file  Occupational History   Not on file  Tobacco Use   Smoking status: Former   Smokeless tobacco: Never  Substance and Sexual Activity   Alcohol use: No   Drug use: No   Sexual activity: Not on file  Other Topics Concern   Not on file  Social History Narrative   Not on file   Social Determinants of Health   Financial Resource Strain: Medium Risk (08/12/2022)   Overall Financial Resource Strain (CARDIA)    Difficulty of Paying Living Expenses: Somewhat hard  Food Insecurity: No Food  Insecurity (07/06/2022)   Hunger Vital Sign    Worried About Running Out of Food in the Last Year: Never true    Ran Out of Food in the Last Year: Never true  Transportation Needs: No Transportation Needs (07/06/2022)   PRAPARE - Administrator, Civil Service (Medical): No    Lack of Transportation (Non-Medical): No  Physical Activity: Not on file  Stress: Not on file  Social Connections: Not on file    Social: Comes with his wife; has many brothers and four sons; originally from [place] Works in Geographical information systems officer.  Family History: The patient's family  history includes Diabetes in his father; Hypertension in his mother. There is no history of Migraines. History of coronary artery disease notable possibly brother. History of heart failure notable for no members. History of arrhythmia notable for no members Family history of sudden cardiac death: Brother was totally fine, had an argument with his daughter, and died suddenly. No history of bicuspid aortic valve or aortic aneurysm. No history of cardiomyopathies including hypertrophic cardiomyopathy, left ventricular non-compaction, or arrhythmogenic right ventricular cardiomyopathy.  ROS:   Please see the history of present illness.     EKGs/Labs/Other Studies Reviewed:    The following studies were reviewed today:  Cardiac Studies & Procedures       ECHOCARDIOGRAM  ECHOCARDIOGRAM COMPLETE 12/05/2022  Narrative ECHOCARDIOGRAM REPORT    [name] Date of Exam: 12/05/2022 Medical Rec #:  [id number]      Height:       66.0 in Accession #:    [id number]     Weight:       200.8 lb Date of Birth:  March 27, 1966       BSA:          2.003 m Patient Age:    57 years       BP:           135/85 mmHg Patient Gender: M              HR:           59 bpm. Exam Location:  [address]  Procedure: 2D Echo, Cardiac Doppler, Color Doppler, 3D Echo and Strain Analysis  Indications:    Q23.1 Bicuspid aortic valve; I35.0 Nonrheumatic aortic (valve) stenosis  History:        Patient has prior history of Echocardiogram examinations, most recent 06/02/2022. Risk Factors:Hypertension and Former Smoker. Aneurysm of ascending aorta without rupture. Family history of sudden cardiac death.  Sonographer:    Johnathan Anderson RCS Referring Phys: [facility] A Valree Feild  IMPRESSIONS   1. Left ventricular ejection fraction, by estimation, is 60 to 65%. The left ventricle has normal function. The left ventricle has no regional wall motion abnormalities. Left ventricular diastolic parameters  were normal. 2. Right ventricular systolic function is normal. The right ventricular size is normal. 3. Left atrial size was moderately dilated. 4. The mitral valve is abnormal. Trivial mitral valve regurgitation. No evidence of mitral stenosis. 5. The aortic valve is bicuspid. There is severe calcifcation of the aortic valve. There is severe thickening of the aortic valve. Aortic valve regurgitation is mild. Severe aortic valve stenosis. 6. Aortic dilatation noted. There is moderate dilatation of the ascending aorta, measuring 45 mm. 7. The inferior vena cava is normal in size with greater than 50% respiratory variability, suggesting right atrial pressure of 3 mmHg.  FINDINGS Left Ventricle: Left ventricular ejection fraction, by estimation, is 60  to 65%. The left ventricle has normal function. The left ventricle has no regional wall motion abnormalities. The left ventricular internal cavity size was normal in size. There is no left ventricular hypertrophy. Left ventricular diastolic parameters were normal.  Right Ventricle: The right ventricular size is normal. No increase in right ventricular wall thickness. Right ventricular systolic function is normal.  Left Atrium: Left atrial size was moderately dilated.  Right Atrium: Right atrial size was normal in size.  Pericardium: There is no evidence of pericardial effusion.  Mitral Valve: The mitral valve is abnormal. There is mild thickening of the mitral valve leaflet(s). There is mild calcification of the mitral valve leaflet(s). Trivial mitral valve regurgitation. No evidence of mitral valve stenosis.  Tricuspid Valve: The tricuspid valve is normal in structure. Tricuspid valve regurgitation is mild . No evidence of tricuspid stenosis.  Aortic Valve: The aortic valve is bicuspid. There is severe calcifcation of the aortic valve. There is severe thickening of the aortic valve. Aortic valve regurgitation is mild. Severe aortic stenosis is  present. Aortic valve mean gradient measures 39.0 mmHg. Aortic valve peak gradient measures 65.3 mmHg. Aortic valve area, by VTI measures 0.83 cm.  Pulmonic Valve: The pulmonic valve was normal in structure. Pulmonic valve regurgitation is mild. No evidence of pulmonic stenosis.  Aorta: Aortic dilatation noted. There is moderate dilatation of the ascending aorta, measuring 45 mm.  Venous: The inferior vena cava is normal in size with greater than 50% respiratory variability, suggesting right atrial pressure of 3 mmHg.  IAS/Shunts: No atrial level shunt detected by color flow Doppler.   LEFT VENTRICLE PLAX 2D LVIDd:         4.40 cm   Diastology LVIDs:         2.60 cm   LV e' medial:    9.46 cm/s LV PW:         0.90 cm   LV E/e' medial:  9.4 LV IVS:        0.80 cm   LV e' lateral:   16.30 cm/s LVOT diam:     2.00 cm   LV E/e' lateral: 5.5 LV SV:         83 LV SV Index:   41 LVOT Area:     3.14 cm  3D Volume EF: 3D EF:        59 % LV EDV:       125 ml LV ESV:       51 ml LV SV:        73 ml  RIGHT VENTRICLE RV Basal diam:  2.40 cm RV Mid diam:    2.60 cm RV S prime:     10.40 cm/s TAPSE (M-mode): 1.6 cm RVSP:           26.8 mmHg  LEFT ATRIUM             Index        RIGHT ATRIUM           Index LA diam:        4.30 cm 2.15 cm/m   RA Pressure: 3.00 mmHg LA Vol (A2C):   52.7 ml 26.31 ml/m  RA Area:     8.89 cm LA Vol (A4C):   38.1 ml 19.02 ml/m  RA Volume:   13.90 ml  6.94 ml/m LA Biplane Vol: 45.7 ml 22.81 ml/m AORTIC VALVE AV Area (Vmax):    0.76 cm AV Area (Vmean):   0.75 cm AV Area (VTI):  0.83 cm AV Vmax:           404.00 cm/s AV Vmean:          290.000 cm/s AV VTI:            0.990 m AV Peak Grad:      65.3 mmHg AV Mean Grad:      39.0 mmHg LVOT Vmax:         97.40 cm/s LVOT Vmean:        69.300 cm/s LVOT VTI:          0.263 m LVOT/AV VTI ratio: 0.27  AORTA Ao Root diam: 3.00 cm Ao Asc diam:  4.50 cm  MITRAL VALVE               TRICUSPID  VALVE MV Area (PHT): 2.33 cm    TR Peak grad:   23.8 mmHg MV Decel Time: 326 msec    TR Vmax:        244.00 cm/s MV E velocity: 89.10 cm/s  Estimated RAP:  3.00 mmHg MV A velocity: 79.70 cm/s  RVSP:           26.8 mmHg MV E/A ratio:  1.12 SHUNTS Systemic VTI:  0.26 m Systemic Diam: 2.00 cm  Charlton Haws MD Electronically signed by Charlton Haws MD Signature Date/Time: 12/05/2022/5:09:36 PM    Final   TEE  ECHO TEE 11/05/2020  Narrative TRANSESOPHOGEAL ECHO REPORT    [name] Date of Exam: 11/05/2020 Medical Rec #:  [id number]      Height:       66.0 in Accession #:    [id number]     Weight:       200.0 lb Date of Birth:  1965-06-19       BSA:          2.000 m Patient Age:    55 years       BP:           113/64 mmHg Patient Gender: M              HR:           73 bpm. Exam Location:  Outpatient  Procedure: Transesophageal Echo, 3D Echo, Cardiac Doppler, Color Doppler and Saline Contrast Bubble Study  Indications:     Q23.1 Bicuspid aortic valve  History:         Patient has prior history of Echocardiogram examinations, most recent 08/14/2020.  Sonographer:     Leta Jungling RDCS Referring Phys:  [id number] [facility] A Izora Ribas Diagnosing Phys: Riley Lam MD  PROCEDURE: After discussion of the risks and benefits of a TEE, an informed consent was obtained from the patient. TEE procedure time was 39 minutes. The transesophogeal probe was passed without difficulty through the esophogus of the patient. Imaged were obtained with the patient in a left lateral decubitus position. No local oropharyngeal anesthetic was provided. Sedation performed by different physician. The patient was monitored while under deep sedation. Anesthestetic sedation was provided intravenously by Anesthesiology: 453.5mg  of Propofol, 40mg  of Lidocaine. Image quality was excellent. The patient's vital signs; including heart rate, blood pressure, and oxygen saturation; remained  stable throughout the procedure. The patient developed no complications during the procedure.  IMPRESSIONS   1. The aortic valve is bicuspid. There is moderate calcification of the aortic valve. There is mild thickening of the aortic valve. Aortic valve regurgitation is trivial. AVA by 3D planimetry 1.90 cm2. 2. Aortic dilatation noted. There  is mild dilatation of the ascending aorta, measuring 44 mm. 3. Left ventricular ejection fraction, by estimation, is 60 to 65%. Left ventricular ejection fraction by 2D MOD biplane is 60.3 %. The left ventricle has normal function. The left ventricle has no regional wall motion abnormalities. 4. Right ventricular systolic function is normal. The right ventricular size is normal. 5. No left atrial/left atrial appendage thrombus was detected. 6. The mitral valve is grossly normal. Trivial mitral valve regurgitation. No evidence of mitral stenosis. 7. Agitated saline contrast bubble study was negative, with no evidence of any interatrial shunt.  Comparison(s): Compared to prior TTE, echodensity from prior study is most consistent with calcification from bicuspid aortic valve.  FINDINGS Left Ventricle: Left ventricular ejection fraction, by estimation, is 60 to 65%. Left ventricular ejection fraction by 2D MOD biplane is 60.3 %. The left ventricle has normal function. The left ventricle has no regional wall motion abnormalities. The left ventricular internal cavity size was normal in size.  Right Ventricle: The right ventricular size is normal. No increase in right ventricular wall thickness. Right ventricular systolic function is normal.  Left Atrium: Left atrial size was normal in size. No left atrial/left atrial appendage thrombus was detected.  Right Atrium: Right atrial size was normal in size.  Pericardium: There is no evidence of pericardial effusion.  Mitral Valve: AVA by 3D planimetry 6.9 cm2. The mitral valve is grossly normal. Trivial mitral  valve regurgitation. No evidence of mitral valve stenosis.  Tricuspid Valve: The tricuspid valve is normal in structure. Tricuspid valve regurgitation is not demonstrated.  Aortic Valve: AVA by 3D planimetry 1.90 cm2. The aortic valve is bicuspid. There is moderate calcification of the aortic valve. There is mild thickening of the aortic valve. Aortic valve regurgitation is trivial.  Pulmonic Valve: The pulmonic valve was grossly normal. Pulmonic valve regurgitation is not visualized.  Aorta: Aortic dilatation noted and the aortic root is normal in size and structure. There is mild dilatation of the ascending aorta, measuring 44 mm. There is minimal (Grade I) plaque involving the descending aorta.  IAS/Shunts: The atrial septum is grossly normal. Agitated saline contrast was given intravenously to evaluate for intracardiac shunting. Agitated saline contrast bubble study was negative, with no evidence of any interatrial shunt.   LEFT VENTRICLE PLAX 2D                        Biplane EF (MOD) LVOT diam:     2.00 cm         LV Biplane EF:   Left LVOT Area:     3.14 cm                         ventricular ejection fraction by LV Volumes (MOD)                                2D MOD LV vol d, MOD    63.8 ml                        biplane is A2C:                                            60.3 %. LV vol d, MOD  129.0 ml A4C: LV vol s, MOD    31.7 ml A2C: LV vol s, MOD    43.7 ml A4C: LV SV MOD A2C:   32.1 ml LV SV MOD A4C:   129.0 ml LV SV MOD BP:    58.8 ml   AORTA Ao Root diam: 2.90 cm Ao Asc diam:  4.40 cm   SHUNTS Systemic Diam: 2.00 cm  Riley Lam MD Electronically signed by Riley Lam MD Signature Date/Time: 11/05/2020/7:48:54 PM    Final    CT SCANS  CT CORONARY MORPH W/CTA COR W/SCORE 11/30/2020  Addendum 11/30/2020  5:48 PM ADDENDUM REPORT: 11/30/2020 17:46  CLINICAL DATA:  Aortic Stenosis Assessment  EXAM: Cardiac TAVR  CT  TECHNIQUE: The patient was scanned on a Siemens Force 192 slice scanner. A 120 kV retrospective scan was triggered in the descending thoracic aorta at 111 HU's. Gantry rotation speed was 270 msecs and collimation was .9 mm. No beta blockade or nitro were given. The 3D data set was reconstructed in 5% intervals of the R-R cycle. Systolic and diastolic phases were analyzed on a dedicated work station using MPR, MIP and VRT modes. The patient received 100 cc of contrast.  FINDINGS: Aortic Valve: Moderately thickened Siever's 1 bicuspid aortic valve with the planimeter valve area is 2.62 cm2  LVOT calcification: None  Annular calcification: Minimal  Aortic Valve Calcium Score: 420  Prosthetic Valve: NA  Mitral Valve: No mitral annular calcification  Left Ventricular Function: LVEF 60% without wall motion abnormality  Aortic Annulus Measurements  Major annulus diameter: 28 mm  Minor annulus diameter: 21 mm  Annular perimeter: 76 mm  Annular area: 4.43 cm2  Aortic Root Measurements  Sinus of Valsalva perimeter: 93 mm  Sinotubular Junction: 2.5 cm  Ascending Thoracic Aorta: 45 mm  Aortic Arch: 37 mm  Descending Thoracic Aorta: 23 mm  Sinus of Valsalva Measurements:  Right coronary cusp width: 26 mm  Left coronary cusp width: 26 mm  Non coronary cusp width: 29 mm  Coronary Artery Height above Annulus:  Left Main: 11 mm  Right Coronary: 8 mm  Coronary Arteries: No nitroglycerin given for coronary artery patency assessment  Coronary Artery Calcium score:  Coronary Calcium Score:  Left main: 0  Left anterior descending artery: 0.366  Left circumflex artery: 0  Right coronary artery: 0  Total: 0.366  Percentile: 56th for age, sex, and race matched control.  Optimum Fluoroscopic Angle for Delivery if TAVR were deployed: LAO 2, CAU 2  IMPRESSION: 1. Anatomically mild aortic stenosis of a bicuspid valve. Findings pertinent to TAVR procedure  are detailed above, though at this time valve team assessment is deferred.  2. Moderate ascending aortic aneurysm without evidence of concomitant coarctation of the aorta.  3. Patient's total coronary artery calcium score is 0.366, which is 56th percentile for subjects of the same age, gender, and race based populations.  RECOMMENDATIONS:  Coronary artery calcium (CAC) score is a strong predictor of incident coronary heart disease (CHD) and provides predictive information beyond traditional risk factors. CAC scoring is reasonable to use in the decision to withhold, postpone, or initiate statin therapy in intermediate-risk or selected borderline-risk asymptomatic adults (age 58-75 years and LDL-C >=70 to <190 mg/dL) who do not have diabetes or established atherosclerotic cardiovascular disease (ASCVD).* In intermediate-risk (10-year ASCVD risk >=7.5% to <20%) adults or selected borderline-risk (10-year ASCVD risk >=5% to <7.5%) adults in whom a CAC score is measured for the purpose of making a treatment decision the  following recommendations have been made:  If CAC = 0, it is reasonable to withhold statin therapy and reassess in 5 to 10 years, as long as higher risk conditions are absent (diabetes mellitus, family history of premature CHD in first degree relatives (males <55 years; females <65 years), cigarette smoking, LDL >=190 mg/dL or other independent risk factors).  If CAC is 1 to 99, it is reasonable to initiate statin therapy for patients >=41 years of age.  If CAC is >=100 or >=75th percentile, it is reasonable to initiate statin therapy at any age.  Cardiology referral should be considered for patients with CAC scores >=400 or >=75th percentile.  *2018 AHA/ACC/AACVPR/AAPA/ABC/ACPM/ADA/AGS/APhA/ASPC/NLA/PCNA Guideline on the Management of Blood Cholesterol: A Report of the American College of Cardiology/American Heart Association Task Force on Clinical Practice  Guidelines. J Am Coll Cardiol. 2019;73(24):3168-3209.  Bora Broner   Electronically Signed By: Riley Lam MD On: 11/30/2020 17:46  Narrative EXAM: OVER-READ INTERPRETATION  CT CHEST  The following report is an over-read performed by radiologist Dr. Corlis Leak of [facility] on 11/30/2020. This over-read does not include interpretation of cardiac or coronary anatomy or pathology. The coronary CTA interpretation by the cardiologist is attached.  COMPARISON:  None.  FINDINGS: Vascular: Heart size normal. No pericardial effusion. Fair contrast opacification of central pulmonary arteries without evident filling defect to suggest acute PE. Aortic valve leaflet coarse calcifications. 4.4 cm ascending thoracic aortic aneurysm. No aortic dissection or stenosis. 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  Mediastinum/Nodes: No mass or adenopathy.  Lungs/Pleura: No pleural effusion. No pneumothorax. 3 mm subpleural nodule, superior segment right lower lobe (Im41,Se10) . 3 mm subpleural nodule, lateral basal segment left lower lobe image 55. Lungs otherwise clear.  Upper Abdomen: See separate CTA abdomen from the same  Musculoskeletal: No chest wall mass or suspicious bone lesions identified.  IMPRESSION: Small bilateral pulmonary nodules less than 4 mm. No follow-up needed if patient is low-risk. Non-contrast chest CT can be considered in 12 months if patient is high-risk. This recommendation follows the consensus statement: Guidelines for Management of Incidental Pulmonary Nodules Detected on CT Images: From the Fleischner Society 2017; Radiology 2017; 284:228-243.  4.4 cm ascending thoracic aortic aneurysm. Recommend annual imaging followup by CTA or MRA. This recommendation follows 2010 ACCF/AHA/AATS/ACR/ASA/SCA/SCAI/SIR/STS/SVM Guidelines for the Diagnosis and Management of Patients with Thoracic  Aortic Disease. Circulation. 2010; 121: M010-U725  Electronically Signed: By: Corlis Leak M.D. On: 11/30/2020 12:43          Recent Labs: No results found for requested labs within last 365 days.  Recent Lipid Panel    Component Value Date/Time   CHOL 133 12/20/2021 0732   TRIG 116 12/20/2021 0732   HDL 40 12/20/2021 0732   CHOLHDL 3.3 12/20/2021 0732   LDLCALC 72 12/20/2021 0732   Physical Exam:    VS:  BP 132/80   Pulse (!) 52   Ht 5\' 6"  (1.676 m)   Wt 204 lb (92.5 kg)   SpO2 98%   BMI 32.93 kg/m     Wt Readings from Last 3 Encounters:  12/26/22 204 lb (92.5 kg)  09/27/22 200 lb 12.8 oz (91.1 kg)  09/14/22 204 lb (92.5 kg)    GEN:  Well nourished, well developed in no acute distress HEENT: Normal NECK: No JVD CARDIAC: Regular bradycardia, III/VI systolic murmur with regurgitation with no rubs, gallops RESPIRATORY:  Clear to auscultation without rales, wheezing or rhonchi  ABDOMEN: Soft,  non-tender, non-distended MUSCULOSKELETAL:  No edema; No deformity  SKIN: Warm and dry NEUROLOGIC:  Alert and oriented x 3 PSYCHIATRIC:  Normal affect   ASSESSMENT:    1. Severe aortic stenosis   2. Aneurysm of ascending aorta without rupture (HCC)   3. Essential hypertension   4. Bicuspid aortic valve     PLAN:    Asymptomatic thoracic aortic aneurysm Severe aortic stenosis with mild AI  Bicuspid Aortic Valve without Coarctation of the Aorta Possible Fhx Aortic Dissection (Brother) HTN - Severe parameters; valve visually appears to be progressing - continue current medications - discussed family screening in prior visits - recommend evaluation for aortic valve replacement vs very close follow up; will get heart catheterization and surgical referral  Risks and benefits of cardiac catheterization have been discussed with the patient.  These include bleeding, infection, kidney damage, stroke, heart attack, death.  The patient understands these risks and is willing to  proceed.  Access recommendations: R radial, R AS Procedural considerations planned for surgical consideration   Post TCTs f/u with me unless surgery  Medication Adjustments/Labs and Tests Ordered: Current medicines are reviewed at length with the patient today.  Concerns regarding medicines are outlined above.  Orders Placed This Encounter  Procedures   CBC   Basic metabolic panel   Ambulatory referral to Cardiothoracic Surgery   EKG 12-Lead    No orders of the defined types were placed in this encounter.    Patient Instructions  Medication Instructions:  Your physician recommends that you continue on your current medications as directed. Please refer to the Current Medication list given to you today.  *If you need a refill on your cardiac medications before your next appointment, please call your pharmacy*   Lab Work: TODAY: CBC, BMP  If you have labs (blood work) drawn today and your tests are completely normal, you will receive your results only by: MyChart Message (if you have MyChart) OR A paper copy in the mail If you have any lab test that is abnormal or we need to change your treatment, we will call you to review the results.   Testing/Procedures: Your physician has requested that you have a cardiac catheterization. Cardiac catheterization is used to diagnose and/or treat various heart conditions. Doctors may recommend this procedure for a number of different reasons. The most common reason is to evaluate chest pain. Chest pain can be a symptom of coronary artery disease (CAD), and cardiac catheterization can show whether plaque is narrowing or blocking your heart's arteries. This procedure is also used to evaluate the valves, as well as measure the blood flow and oxygen levels in different parts of your heart. For further information please visit [email or web address] Please follow instruction sheet, as given.    Your physician has referred you to see Cardiothoracic  Surgery.  Follow-Up: At [facility], you and your health needs are our priority.  As part of our continuing mission to provide you with exceptional heart care, we have created designated Provider Care Teams.  These Care Teams include your primary Cardiologist (physician) and Advanced Practice Providers (APPs -  Physician Assistants and Nurse Practitioners) who all work together to provide you with the care you need, when you need it.   Your next appointment:   1 year(s) or after surgery   Provider:   Christell Constant, MD     Other Instructions  You are scheduled for a Cardiac Catheterization on Wednesday, July 24 with Dr. Peter [place].  1.  Please arrive at the [facility] (Main Entrance A) at [facility]: [address] [place], [place] [address] at 9:30 AM (This time is 2 hour(s) before your procedure to ensure your preparation). Free valet parking service is available. You will check in at ADMITTING. The support person will be asked to wait in the waiting room.  It is OK to have someone drop you off and come back when you are ready to be discharged.    Special note: Every effort is made to have your procedure done on time. Please understand that emergencies sometimes delay scheduled procedures.  2. Diet: Do not eat solid foods after midnight.  The patient may have clear liquids until 5am upon the day of the procedure.  3. Labs: You will need to have blood drawn TODAY.  4. Medication instructions in preparation for your procedure:   Contrast Allergy: No   On the morning of your procedure, take your Aspirin 81 mg and any morning medicines NOT listed above.  You may use sips of water.  5. Plan to go home the same day, you will only stay overnight if medically necessary. 6. Bring a current list of your medications and current insurance cards. 7. You MUST have a responsible person to drive you home. 8. Someone MUST be with you the first 24 hours after you arrive  home or your discharge will be delayed. 9. Please wear clothes that are easy to get on and off and wear slip-on shoes.  Thank you for allowing [place] to care for you!   -- [facility]     Signed, Johnathan Constant, MD  12/26/2022 10:36 AM    [facility]

## 2022-12-25 NOTE — H&P (View-Only) (Signed)
Cardiology Office Note:    Date:  12/26/2022   ID:  Johnathan Anderson, DOB Mar 26, 1966, MRN [id number]  PCP:  Irven Coe, MD   [facility]  Cardiologist:  Christell Constant, MD  Advanced Practice Provider:  No care team member to display Electrophysiologist:  None   CC: Follow up AS and TAA  History of Present Illness:    Johnathan Anderson is a 57 y.o. male with a hx of recent echocardiogram 08/14/20 who presents for evaluation 10/04/20.  In interim of this visit, patient had TEE and Cardiac CT- minimal CAC, Bicuspid Aortic valve with moderate aortic aneurysm. Has 2022 imaging with stable valve disease and aortic size.  Seen 06/14/21. 2023: Moderate to severe aortic stenosis. 2024, further increase in his aortic gradients.  Patient notes that he is doing well.   Since last visit notes aortic valve gradients have progressed . There are no interval hospital/ED visit.   EKG showed sinus bradycardia; LVH is not as prominent on this exam.  No chest pain or pressure .  No SOB/DOE and no PND/Orthopnea.  No weight gain or leg swelling.  No palpitations or syncope.  Has questions.   Past Surgical History:  Procedure Laterality Date   APPENDECTOMY     EYE SURGERY     STERIOD INJECTION  09/16/2011   Procedure: MINOR STEROID INJECTION;  Surgeon: Mat Carne, MD;  Location: [facility];  Service: Orthopedics;  Laterality: Left;  Left Lumbar Five-Sacral One Transforaminal Epidural Steroid Injection   TEE WITHOUT CARDIOVERSION N/A 11/05/2020   Procedure: TRANSESOPHAGEAL ECHOCARDIOGRAM (TEE);  Surgeon: Christell Constant, MD;  Location: [facility];  Service: Cardiovascular;  Laterality: N/A;    Current Medications: Current Meds  Medication Sig   amLODipine (NORVASC) 5 MG tablet Take 1 tablet (5 mg total) by mouth daily.   calcium carbonate (TUMS) 500 MG chewable tablet as needed for indigestion or heartburn.   diclofenac Sodium (VOLTAREN) 1 % GEL  Apply 1 application topically in the morning, at noon, and at bedtime.   omeprazole (PRILOSEC) 20 MG capsule Take 20 mg by mouth every morning.   ondansetron (ZOFRAN-ODT) 4 MG disintegrating tablet Take 1-2 tablets (4-8 mg total) by mouth every 8 (eight) hours as needed.   rizatriptan (MAXALT-MLT) 10 MG disintegrating tablet Take 1 tablet (10 mg total) by mouth as needed for migraine. May repeat in 2 hours if needed   rosuvastatin (CRESTOR) 10 MG tablet Take 1 tablet (10 mg total) by mouth daily.   topiramate (TOPAMAX) 50 MG tablet Take 1 tablet (50 mg total) by mouth at bedtime.     Allergies:   Levaquin [levofloxacin]   Social History   Socioeconomic History   Marital status: Married    Spouse name: Not on file   Number of children: Not on file   Years of education: Not on file   Highest education level: Not on file  Occupational History   Not on file  Tobacco Use   Smoking status: Former   Smokeless tobacco: Never  Substance and Sexual Activity   Alcohol use: No   Drug use: No   Sexual activity: Not on file  Other Topics Concern   Not on file  Social History Narrative   Not on file   Social Determinants of Health   Financial Resource Strain: Medium Risk (08/12/2022)   Overall Financial Resource Strain (CARDIA)    Difficulty of Paying Living Expenses: Somewhat hard  Food Insecurity: No Food  Insecurity (07/06/2022)   Hunger Vital Sign    Worried About Running Out of Food in the Last Year: Never true    Ran Out of Food in the Last Year: Never true  Transportation Needs: No Transportation Needs (07/06/2022)   PRAPARE - Administrator, Civil Service (Medical): No    Lack of Transportation (Non-Medical): No  Physical Activity: Not on file  Stress: Not on file  Social Connections: Not on file    Social: Comes with his wife; has many brothers and four sons; originally from [place] Works in Geographical information systems officer.  Family History: The patient's family  history includes Diabetes in his father; Hypertension in his mother. There is no history of Migraines. History of coronary artery disease notable possibly brother. History of heart failure notable for no members. History of arrhythmia notable for no members Family history of sudden cardiac death: Brother was totally fine, had an argument with his daughter, and died suddenly. No history of bicuspid aortic valve or aortic aneurysm. No history of cardiomyopathies including hypertrophic cardiomyopathy, left ventricular non-compaction, or arrhythmogenic right ventricular cardiomyopathy.  ROS:   Please see the history of present illness.     EKGs/Labs/Other Studies Reviewed:    The following studies were reviewed today:  Cardiac Studies & Procedures       ECHOCARDIOGRAM  ECHOCARDIOGRAM COMPLETE 12/05/2022  Narrative ECHOCARDIOGRAM REPORT    [name] Date of Exam: 12/05/2022 Medical Rec #:  [id number]      Height:       66.0 in Accession #:    [id number]     Weight:       200.8 lb Date of Birth:  1965/11/28       BSA:          2.003 m Patient Age:    57 years       BP:           135/85 mmHg Patient Gender: M              HR:           59 bpm. Exam Location:  [address]  Procedure: 2D Echo, Cardiac Doppler, Color Doppler, 3D Echo and Strain Analysis  Indications:    Q23.1 Bicuspid aortic valve; I35.0 Nonrheumatic aortic (valve) stenosis  History:        Patient has prior history of Echocardiogram examinations, most recent 06/02/2022. Risk Factors:Hypertension and Former Smoker. Aneurysm of ascending aorta without rupture. Family history of sudden cardiac death.  Sonographer:    Cathie Beams RCS Referring Phys: [facility] A Titianna Loomis  IMPRESSIONS   1. Left ventricular ejection fraction, by estimation, is 60 to 65%. The left ventricle has normal function. The left ventricle has no regional wall motion abnormalities. Left ventricular diastolic parameters  were normal. 2. Right ventricular systolic function is normal. The right ventricular size is normal. 3. Left atrial size was moderately dilated. 4. The mitral valve is abnormal. Trivial mitral valve regurgitation. No evidence of mitral stenosis. 5. The aortic valve is bicuspid. There is severe calcifcation of the aortic valve. There is severe thickening of the aortic valve. Aortic valve regurgitation is mild. Severe aortic valve stenosis. 6. Aortic dilatation noted. There is moderate dilatation of the ascending aorta, measuring 45 mm. 7. The inferior vena cava is normal in size with greater than 50% respiratory variability, suggesting right atrial pressure of 3 mmHg.  FINDINGS Left Ventricle: Left ventricular ejection fraction, by estimation, is 60  to 65%. The left ventricle has normal function. The left ventricle has no regional wall motion abnormalities. The left ventricular internal cavity size was normal in size. There is no left ventricular hypertrophy. Left ventricular diastolic parameters were normal.  Right Ventricle: The right ventricular size is normal. No increase in right ventricular wall thickness. Right ventricular systolic function is normal.  Left Atrium: Left atrial size was moderately dilated.  Right Atrium: Right atrial size was normal in size.  Pericardium: There is no evidence of pericardial effusion.  Mitral Valve: The mitral valve is abnormal. There is mild thickening of the mitral valve leaflet(s). There is mild calcification of the mitral valve leaflet(s). Trivial mitral valve regurgitation. No evidence of mitral valve stenosis.  Tricuspid Valve: The tricuspid valve is normal in structure. Tricuspid valve regurgitation is mild . No evidence of tricuspid stenosis.  Aortic Valve: The aortic valve is bicuspid. There is severe calcifcation of the aortic valve. There is severe thickening of the aortic valve. Aortic valve regurgitation is mild. Severe aortic stenosis is  present. Aortic valve mean gradient measures 39.0 mmHg. Aortic valve peak gradient measures 65.3 mmHg. Aortic valve area, by VTI measures 0.83 cm.  Pulmonic Valve: The pulmonic valve was normal in structure. Pulmonic valve regurgitation is mild. No evidence of pulmonic stenosis.  Aorta: Aortic dilatation noted. There is moderate dilatation of the ascending aorta, measuring 45 mm.  Venous: The inferior vena cava is normal in size with greater than 50% respiratory variability, suggesting right atrial pressure of 3 mmHg.  IAS/Shunts: No atrial level shunt detected by color flow Doppler.   LEFT VENTRICLE PLAX 2D LVIDd:         4.40 cm   Diastology LVIDs:         2.60 cm   LV e' medial:    9.46 cm/s LV PW:         0.90 cm   LV E/e' medial:  9.4 LV IVS:        0.80 cm   LV e' lateral:   16.30 cm/s LVOT diam:     2.00 cm   LV E/e' lateral: 5.5 LV SV:         83 LV SV Index:   41 LVOT Area:     3.14 cm  3D Volume EF: 3D EF:        59 % LV EDV:       125 ml LV ESV:       51 ml LV SV:        73 ml  RIGHT VENTRICLE RV Basal diam:  2.40 cm RV Mid diam:    2.60 cm RV S prime:     10.40 cm/s TAPSE (M-mode): 1.6 cm RVSP:           26.8 mmHg  LEFT ATRIUM             Index        RIGHT ATRIUM           Index LA diam:        4.30 cm 2.15 cm/m   RA Pressure: 3.00 mmHg LA Vol (A2C):   52.7 ml 26.31 ml/m  RA Area:     8.89 cm LA Vol (A4C):   38.1 ml 19.02 ml/m  RA Volume:   13.90 ml  6.94 ml/m LA Biplane Vol: 45.7 ml 22.81 ml/m AORTIC VALVE AV Area (Vmax):    0.76 cm AV Area (Vmean):   0.75 cm AV Area (VTI):  0.83 cm AV Vmax:           404.00 cm/s AV Vmean:          290.000 cm/s AV VTI:            0.990 m AV Peak Grad:      65.3 mmHg AV Mean Grad:      39.0 mmHg LVOT Vmax:         97.40 cm/s LVOT Vmean:        69.300 cm/s LVOT VTI:          0.263 m LVOT/AV VTI ratio: 0.27  AORTA Ao Root diam: 3.00 cm Ao Asc diam:  4.50 cm  MITRAL VALVE               TRICUSPID  VALVE MV Area (PHT): 2.33 cm    TR Peak grad:   23.8 mmHg MV Decel Time: 326 msec    TR Vmax:        244.00 cm/s MV E velocity: 89.10 cm/s  Estimated RAP:  3.00 mmHg MV A velocity: 79.70 cm/s  RVSP:           26.8 mmHg MV E/A ratio:  1.12 SHUNTS Systemic VTI:  0.26 m Systemic Diam: 2.00 cm  Charlton Haws MD Electronically signed by Charlton Haws MD Signature Date/Time: 12/05/2022/5:09:36 PM    Final   TEE  ECHO TEE 11/05/2020  Narrative TRANSESOPHOGEAL ECHO REPORT    Patient Name:   PORTER MOES Date of Exam: 11/05/2020 Medical Rec #:  [id number]      Height:       66.0 in Accession #:    [id number]     Weight:       200.0 lb Date of Birth:  Dec 26, 1965       BSA:          2.000 m Patient Age:    55 years       BP:           113/64 mmHg Patient Gender: M              HR:           73 bpm. Exam Location:  Outpatient  Procedure: Transesophageal Echo, 3D Echo, Cardiac Doppler, Color Doppler and Saline Contrast Bubble Study  Indications:     Q23.1 Bicuspid aortic valve  History:         Patient has prior history of Echocardiogram examinations, most recent 08/14/2020.  Sonographer:     Leta Jungling RDCS Referring Phys:  [id number] [facility] A Izora Ribas Diagnosing Phys: Riley Lam MD  PROCEDURE: After discussion of the risks and benefits of a TEE, an informed consent was obtained from the patient. TEE procedure time was 39 minutes. The transesophogeal probe was passed without difficulty through the esophogus of the patient. Imaged were obtained with the patient in a left lateral decubitus position. No local oropharyngeal anesthetic was provided. Sedation performed by different physician. The patient was monitored while under deep sedation. Anesthestetic sedation was provided intravenously by Anesthesiology: 453.5mg  of Propofol, 40mg  of Lidocaine. Image quality was excellent. The patient's vital signs; including heart rate, blood pressure, and oxygen saturation; remained  stable throughout the procedure. The patient developed no complications during the procedure.  IMPRESSIONS   1. The aortic valve is bicuspid. There is moderate calcification of the aortic valve. There is mild thickening of the aortic valve. Aortic valve regurgitation is trivial. AVA by 3D planimetry 1.90 cm2. 2. Aortic dilatation noted. There  is mild dilatation of the ascending aorta, measuring 44 mm. 3. Left ventricular ejection fraction, by estimation, is 60 to 65%. Left ventricular ejection fraction by 2D MOD biplane is 60.3 %. The left ventricle has normal function. The left ventricle has no regional wall motion abnormalities. 4. Right ventricular systolic function is normal. The right ventricular size is normal. 5. No left atrial/left atrial appendage thrombus was detected. 6. The mitral valve is grossly normal. Trivial mitral valve regurgitation. No evidence of mitral stenosis. 7. Agitated saline contrast bubble study was negative, with no evidence of any interatrial shunt.  Comparison(s): Compared to prior TTE, echodensity from prior study is most consistent with calcification from bicuspid aortic valve.  FINDINGS Left Ventricle: Left ventricular ejection fraction, by estimation, is 60 to 65%. Left ventricular ejection fraction by 2D MOD biplane is 60.3 %. The left ventricle has normal function. The left ventricle has no regional wall motion abnormalities. The left ventricular internal cavity size was normal in size.  Right Ventricle: The right ventricular size is normal. No increase in right ventricular wall thickness. Right ventricular systolic function is normal.  Left Atrium: Left atrial size was normal in size. No left atrial/left atrial appendage thrombus was detected.  Right Atrium: Right atrial size was normal in size.  Pericardium: There is no evidence of pericardial effusion.  Mitral Valve: AVA by 3D planimetry 6.9 cm2. The mitral valve is grossly normal. Trivial mitral  valve regurgitation. No evidence of mitral valve stenosis.  Tricuspid Valve: The tricuspid valve is normal in structure. Tricuspid valve regurgitation is not demonstrated.  Aortic Valve: AVA by 3D planimetry 1.90 cm2. The aortic valve is bicuspid. There is moderate calcification of the aortic valve. There is mild thickening of the aortic valve. Aortic valve regurgitation is trivial.  Pulmonic Valve: The pulmonic valve was grossly normal. Pulmonic valve regurgitation is not visualized.  Aorta: Aortic dilatation noted and the aortic root is normal in size and structure. There is mild dilatation of the ascending aorta, measuring 44 mm. There is minimal (Grade I) plaque involving the descending aorta.  IAS/Shunts: The atrial septum is grossly normal. Agitated saline contrast was given intravenously to evaluate for intracardiac shunting. Agitated saline contrast bubble study was negative, with no evidence of any interatrial shunt.   LEFT VENTRICLE PLAX 2D                        Biplane EF (MOD) LVOT diam:     2.00 cm         LV Biplane EF:   Left LVOT Area:     3.14 cm                         ventricular ejection fraction by LV Volumes (MOD)                                2D MOD LV vol d, MOD    63.8 ml                        biplane is A2C:                                            60.3 %. LV vol d, MOD  129.0 ml A4C: LV vol s, MOD    31.7 ml A2C: LV vol s, MOD    43.7 ml A4C: LV SV MOD A2C:   32.1 ml LV SV MOD A4C:   129.0 ml LV SV MOD BP:    58.8 ml   AORTA Ao Root diam: 2.90 cm Ao Asc diam:  4.40 cm   SHUNTS Systemic Diam: 2.00 cm  Riley Lam MD Electronically signed by Riley Lam MD Signature Date/Time: 11/05/2020/7:48:54 PM    Final    CT SCANS  CT CORONARY MORPH W/CTA COR W/SCORE 11/30/2020  Addendum 11/30/2020  5:48 PM ADDENDUM REPORT: 11/30/2020 17:46  CLINICAL DATA:  Aortic Stenosis Assessment  EXAM: Cardiac TAVR  CT  TECHNIQUE: The patient was scanned on a Siemens Force 192 slice scanner. A 120 kV retrospective scan was triggered in the descending thoracic aorta at 111 HU's. Gantry rotation speed was 270 msecs and collimation was .9 mm. No beta blockade or nitro were given. The 3D data set was reconstructed in 5% intervals of the R-R cycle. Systolic and diastolic phases were analyzed on a dedicated work station using MPR, MIP and VRT modes. The patient received 100 cc of contrast.  FINDINGS: Aortic Valve: Moderately thickened Siever's 1 bicuspid aortic valve with the planimeter valve area is 2.62 cm2  LVOT calcification: None  Annular calcification: Minimal  Aortic Valve Calcium Score: 420  Prosthetic Valve: NA  Mitral Valve: No mitral annular calcification  Left Ventricular Function: LVEF 60% without wall motion abnormality  Aortic Annulus Measurements  Major annulus diameter: 28 mm  Minor annulus diameter: 21 mm  Annular perimeter: 76 mm  Annular area: 4.43 cm2  Aortic Root Measurements  Sinus of Valsalva perimeter: 93 mm  Sinotubular Junction: 2.5 cm  Ascending Thoracic Aorta: 45 mm  Aortic Arch: 37 mm  Descending Thoracic Aorta: 23 mm  Sinus of Valsalva Measurements:  Right coronary cusp width: 26 mm  Left coronary cusp width: 26 mm  Non coronary cusp width: 29 mm  Coronary Artery Height above Annulus:  Left Main: 11 mm  Right Coronary: 8 mm  Coronary Arteries: No nitroglycerin given for coronary artery patency assessment  Coronary Artery Calcium score:  Coronary Calcium Score:  Left main: 0  Left anterior descending artery: 0.366  Left circumflex artery: 0  Right coronary artery: 0  Total: 0.366  Percentile: 56th for age, sex, and race matched control.  Optimum Fluoroscopic Angle for Delivery if TAVR were deployed: LAO 2, CAU 2  IMPRESSION: 1. Anatomically mild aortic stenosis of a bicuspid valve. Findings pertinent to TAVR procedure  are detailed above, though at this time valve team assessment is deferred.  2. Moderate ascending aortic aneurysm without evidence of concomitant coarctation of the aorta.  3. Patient's total coronary artery calcium score is 0.366, which is 56th percentile for subjects of the same age, gender, and race based populations.  RECOMMENDATIONS:  Coronary artery calcium (CAC) score is a strong predictor of incident coronary heart disease (CHD) and provides predictive information beyond traditional risk factors. CAC scoring is reasonable to use in the decision to withhold, postpone, or initiate statin therapy in intermediate-risk or selected borderline-risk asymptomatic adults (age 40-75 years and LDL-C >=70 to <190 mg/dL) who do not have diabetes or established atherosclerotic cardiovascular disease (ASCVD).* In intermediate-risk (10-year ASCVD risk >=7.5% to <20%) adults or selected borderline-risk (10-year ASCVD risk >=5% to <7.5%) adults in whom a CAC score is measured for the purpose of making a treatment decision the  following recommendations have been made:  If CAC = 0, it is reasonable to withhold statin therapy and reassess in 5 to 10 years, as long as higher risk conditions are absent (diabetes mellitus, family history of premature CHD in first degree relatives (males <55 years; females <65 years), cigarette smoking, LDL >=190 mg/dL or other independent risk factors).  If CAC is 1 to 99, it is reasonable to initiate statin therapy for patients >=69 years of age.  If CAC is >=100 or >=75th percentile, it is reasonable to initiate statin therapy at any age.  Cardiology referral should be considered for patients with CAC scores >=400 or >=75th percentile.  *2018 AHA/ACC/AACVPR/AAPA/ABC/ACPM/ADA/AGS/APhA/ASPC/NLA/PCNA Guideline on the Management of Blood Cholesterol: A Report of the American College of Cardiology/American Heart Association Task Force on Clinical Practice  Guidelines. J Am Coll Cardiol. 2019;73(24):3168-3209.  Dominie Benedick   Electronically Signed By: Riley Lam MD On: 11/30/2020 17:46  Narrative EXAM: OVER-READ INTERPRETATION  CT CHEST  The following report is an over-read performed by radiologist Dr. Corlis Leak of [facility] on 11/30/2020. This over-read does not include interpretation of cardiac or coronary anatomy or pathology. The coronary CTA interpretation by the cardiologist is attached.  COMPARISON:  None.  FINDINGS: Vascular: Heart size normal. No pericardial effusion. Fair contrast opacification of central pulmonary arteries without evident filling defect to suggest acute PE. Aortic valve leaflet coarse calcifications. 4.4 cm ascending thoracic aortic aneurysm. No aortic dissection or stenosis. 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  Mediastinum/Nodes: No mass or adenopathy.  Lungs/Pleura: No pleural effusion. No pneumothorax. 3 mm subpleural nodule, superior segment right lower lobe (Im41,Se10) . 3 mm subpleural nodule, lateral basal segment left lower lobe image 55. Lungs otherwise clear.  Upper Abdomen: See separate CTA abdomen from the same  Musculoskeletal: No chest wall mass or suspicious bone lesions identified.  IMPRESSION: Small bilateral pulmonary nodules less than 4 mm. No follow-up needed if patient is low-risk. Non-contrast chest CT can be considered in 12 months if patient is high-risk. This recommendation follows the consensus statement: Guidelines for Management of Incidental Pulmonary Nodules Detected on CT Images: From the Fleischner Society 2017; Radiology 2017; 284:228-243.  4.4 cm ascending thoracic aortic aneurysm. Recommend annual imaging followup by CTA or MRA. This recommendation follows 2010 ACCF/AHA/AATS/ACR/ASA/SCA/SCAI/SIR/STS/SVM Guidelines for the Diagnosis and Management of Patients with Thoracic  Aortic Disease. Circulation. 2010; 121: A213-Y865  Electronically Signed: By: Corlis Leak M.D. On: 11/30/2020 12:43          Recent Labs: No results found for requested labs within last 365 days.  Recent Lipid Panel    Component Value Date/Time   CHOL 133 12/20/2021 0732   TRIG 116 12/20/2021 0732   HDL 40 12/20/2021 0732   CHOLHDL 3.3 12/20/2021 0732   LDLCALC 72 12/20/2021 0732   Physical Exam:    VS:  BP 132/80   Pulse (!) 52   Ht 5\' 6"  (1.676 m)   Wt 204 lb (92.5 kg)   SpO2 98%   BMI 32.93 kg/m     Wt Readings from Last 3 Encounters:  12/26/22 204 lb (92.5 kg)  09/27/22 200 lb 12.8 oz (91.1 kg)  09/14/22 204 lb (92.5 kg)    GEN:  Well nourished, well developed in no acute distress HEENT: Normal NECK: No JVD CARDIAC: Regular bradycardia, III/VI systolic murmur with regurgitation with no rubs, gallops RESPIRATORY:  Clear to auscultation without rales, wheezing or rhonchi  ABDOMEN: Soft,  non-tender, non-distended MUSCULOSKELETAL:  No edema; No deformity  SKIN: Warm and dry NEUROLOGIC:  Alert and oriented x 3 PSYCHIATRIC:  Normal affect   ASSESSMENT:    1. Severe aortic stenosis   2. Aneurysm of ascending aorta without rupture (HCC)   3. Essential hypertension   4. Bicuspid aortic valve     PLAN:    Asymptomatic thoracic aortic aneurysm Severe aortic stenosis with mild AI  Bicuspid Aortic Valve without Coarctation of the Aorta Possible Fhx Aortic Dissection (Brother) HTN - Severe parameters; valve visually appears to be progressing - continue current medications - discussed family screening in prior visits - recommend evaluation for aortic valve replacement vs very close follow up; will get heart catheterization and surgical referral  Risks and benefits of cardiac catheterization have been discussed with the patient.  These include bleeding, infection, kidney damage, stroke, heart attack, death.  The patient understands these risks and is willing to  proceed.  Access recommendations: R radial, R AS Procedural considerations planned for surgical consideration   Post TCTs f/u with me unless surgery  Medication Adjustments/Labs and Tests Ordered: Current medicines are reviewed at length with the patient today.  Concerns regarding medicines are outlined above.  Orders Placed This Encounter  Procedures   CBC   Basic metabolic panel   Ambulatory referral to Cardiothoracic Surgery   EKG 12-Lead    No orders of the defined types were placed in this encounter.    Patient Instructions  Medication Instructions:  Your physician recommends that you continue on your current medications as directed. Please refer to the Current Medication list given to you today.  *If you need a refill on your cardiac medications before your next appointment, please call your pharmacy*   Lab Work: TODAY: CBC, BMP  If you have labs (blood work) drawn today and your tests are completely normal, you will receive your results only by: MyChart Message (if you have MyChart) OR A paper copy in the mail If you have any lab test that is abnormal or we need to change your treatment, we will call you to review the results.   Testing/Procedures: Your physician has requested that you have a cardiac catheterization. Cardiac catheterization is used to diagnose and/or treat various heart conditions. Doctors may recommend this procedure for a number of different reasons. The most common reason is to evaluate chest pain. Chest pain can be a symptom of coronary artery disease (CAD), and cardiac catheterization can show whether plaque is narrowing or blocking your heart's arteries. This procedure is also used to evaluate the valves, as well as measure the blood flow and oxygen levels in different parts of your heart. For further information please visit [email or web address] Please follow instruction sheet, as given.    Your physician has referred you to see Cardiothoracic  Surgery.  Follow-Up: At [facility], you and your health needs are our priority.  As part of our continuing mission to provide you with exceptional heart care, we have created designated Provider Care Teams.  These Care Teams include your primary Cardiologist (physician) and Advanced Practice Providers (APPs -  Physician Assistants and Nurse Practitioners) who all work together to provide you with the care you need, when you need it.   Your next appointment:   1 year(s) or after surgery   Provider:   Christell Constant, MD     Other Instructions  You are scheduled for a Cardiac Catheterization on Wednesday, July 24 with Dr. Peter Swaziland.  1.  Please arrive at the [facility] (Main Entrance A) at [facility]: [address] [place], [place] [address] at 9:30 AM (This time is 2 hour(s) before your procedure to ensure your preparation). Free valet parking service is available. You will check in at ADMITTING. The support person will be asked to wait in the waiting room.  It is OK to have someone drop you off and come back when you are ready to be discharged.    Special note: Every effort is made to have your procedure done on time. Please understand that emergencies sometimes delay scheduled procedures.  2. Diet: Do not eat solid foods after midnight.  The patient may have clear liquids until 5am upon the day of the procedure.  3. Labs: You will need to have blood drawn TODAY.  4. Medication instructions in preparation for your procedure:   Contrast Allergy: No   On the morning of your procedure, take your Aspirin 81 mg and any morning medicines NOT listed above.  You may use sips of water.  5. Plan to go home the same day, you will only stay overnight if medically necessary. 6. Bring a current list of your medications and current insurance cards. 7. You MUST have a responsible person to drive you home. 8. Someone MUST be with you the first 24 hours after you arrive  home or your discharge will be delayed. 9. Please wear clothes that are easy to get on and off and wear slip-on shoes.  Thank you for allowing [place] to care for you!   -- [facility] Invasive Cardiovascular services     Signed, Christell Constant, MD  12/26/2022 10:36 AM    [facility]

## 2022-12-26 ENCOUNTER — Encounter: Payer: Self-pay | Admitting: Internal Medicine

## 2022-12-26 ENCOUNTER — Ambulatory Visit: Payer: Commercial Managed Care - HMO | Attending: Internal Medicine | Admitting: Internal Medicine

## 2022-12-26 VITALS — BP 132/80 | HR 52 | Ht 66.0 in | Wt 204.0 lb

## 2022-12-26 DIAGNOSIS — I350 Nonrheumatic aortic (valve) stenosis: Secondary | ICD-10-CM

## 2022-12-26 DIAGNOSIS — I10 Essential (primary) hypertension: Secondary | ICD-10-CM | POA: Diagnosis not present

## 2022-12-26 DIAGNOSIS — Q231 Congenital insufficiency of aortic valve: Secondary | ICD-10-CM

## 2022-12-26 DIAGNOSIS — I7121 Aneurysm of the ascending aorta, without rupture: Secondary | ICD-10-CM | POA: Diagnosis not present

## 2022-12-26 NOTE — Patient Instructions (Signed)
Medication Instructions:  Your physician recommends that you continue on your current medications as directed. Please refer to the Current Medication list given to you today.  *If you need a refill on your cardiac medications before your next appointment, please call your pharmacy*   Lab Work: TODAY: CBC, BMP  If you have labs (blood work) drawn today and your tests are completely normal, you will receive your results only by: MyChart Message (if you have MyChart) OR A paper copy in the mail If you have any lab test that is abnormal or we need to change your treatment, we will call you to review the results.   Testing/Procedures: Your physician has requested that you have a cardiac catheterization. Cardiac catheterization is used to diagnose and/or treat various heart conditions. Doctors may recommend this procedure for a number of different reasons. The most common reason is to evaluate chest pain. Chest pain can be a symptom of coronary artery disease (CAD), and cardiac catheterization can show whether plaque is narrowing or blocking your heart's arteries. This procedure is also used to evaluate the valves, as well as measure the blood flow and oxygen levels in different parts of your heart. For further information please visit [email or web address] Please follow instruction sheet, as given.    Your physician has referred you to see Cardiothoracic Surgery.  Follow-Up: At [facility], you and your health needs are our priority.  As part of our continuing mission to provide you with exceptional heart care, we have created designated Provider Care Teams.  These Care Teams include your primary Cardiologist (physician) and Advanced Practice Providers (APPs -  Physician Assistants and Nurse Practitioners) who all work together to provide you with the care you need, when you need it.   Your next appointment:   1 year(s) or after surgery   Provider:   Christell Constant, MD      Other Instructions  You are scheduled for a Cardiac Catheterization on Wednesday, July 24 with Dr. Peter Swaziland.  1. Please arrive at the [facility] (Main Entrance A) at [facility]: [address] [place], [place] [address] at 9:30 AM (This time is 2 hour(s) before your procedure to ensure your preparation). Free valet parking service is available. You will check in at ADMITTING. The support person will be asked to wait in the waiting room.  It is OK to have someone drop you off and come back when you are ready to be discharged.    Special note: Every effort is made to have your procedure done on time. Please understand that emergencies sometimes delay scheduled procedures.  2. Diet: Do not eat solid foods after midnight.  The patient may have clear liquids until 5am upon the day of the procedure.  3. Labs: You will need to have blood drawn TODAY.  4. Medication instructions in preparation for your procedure:   Contrast Allergy: No   On the morning of your procedure, take your Aspirin 81 mg and any morning medicines NOT listed above.  You may use sips of water.  5. Plan to go home the same day, you will only stay overnight if medically necessary. 6. Bring a current list of your medications and current insurance cards. 7. You MUST have a responsible person to drive you home. 8. Someone MUST be with you the first 24 hours after you arrive home or your discharge will be delayed. 9. Please wear clothes that are easy to get on and off and wear  slip-on shoes.  Thank you for allowing [place] to care for you!   --  Invasive Cardiovascular services

## 2022-12-27 ENCOUNTER — Telehealth: Payer: Self-pay | Admitting: *Deleted

## 2022-12-27 LAB — CBC
Hematocrit: 45.2 % (ref 37.5–51.0)
Hemoglobin: 15.3 g/dL (ref 13.0–17.7)
MCH: 29.8 pg (ref 26.6–33.0)
MCHC: 33.8 g/dL (ref 31.5–35.7)
MCV: 88 fL (ref 79–97)
Platelets: 157 10*3/uL (ref 150–450)
RBC: 5.13 x10E6/uL (ref 4.14–5.80)
RDW: 13.3 % (ref 11.6–15.4)
WBC: 6.8 10*3/uL (ref 3.4–10.8)

## 2022-12-27 LAB — BASIC METABOLIC PANEL
BUN/Creatinine Ratio: 19 (ref 9–20)
BUN: 21 mg/dL (ref 6–24)
CO2: 25 mmol/L (ref 20–29)
Calcium: 9.8 mg/dL (ref 8.7–10.2)
Chloride: 102 mmol/L (ref 96–106)
Creatinine, Ser: 1.13 mg/dL (ref 0.76–1.27)
Glucose: 101 mg/dL — ABNORMAL HIGH (ref 70–99)
Potassium: 4.4 mmol/L (ref 3.5–5.2)
Sodium: 140 mmol/L (ref 134–144)
eGFR: 75 mL/min/{1.73_m2} (ref >59)

## 2022-12-27 NOTE — Telephone Encounter (Signed)
Cardiac Catheterization scheduled at [facility] for: Wednesday December 28, 2022 11:30 AM Arrival time [facility] Main Entrance A at: 9:30 AM  Nothing to eat after midnight prior to procedure, clear liquids until 5 AM day of procedure.  Medication instructions: -Usual morning medications can be taken with sips of water including aspirin 81 mg.  Confirmed patient has responsible adult to drive home post procedure and be with patient first 24 hours after arriving home.  Plan to go home the same day, you will only stay overnight if medically necessary.  Reviewed procedure instructions with patient.

## 2022-12-27 NOTE — Addendum Note (Signed)
Addended by: Riley Lam A on: 12/27/2022 08:25 AM   Modules accepted: Orders

## 2022-12-28 ENCOUNTER — Ambulatory Visit (HOSPITAL_COMMUNITY)
Admission: RE | Admit: 2022-12-28 | Discharge: 2022-12-28 | Disposition: A | Discharge status: Home / Self Care | Payer: Commercial Managed Care - HMO | Attending: Cardiology | Admitting: Cardiology

## 2022-12-28 ENCOUNTER — Encounter (HOSPITAL_COMMUNITY)
Admission: RE | Disposition: A | Discharge status: Home / Self Care | Payer: Self-pay | Source: Home / Self Care | Attending: Cardiology

## 2022-12-28 ENCOUNTER — Other Ambulatory Visit: Payer: Self-pay

## 2022-12-28 DIAGNOSIS — I10 Essential (primary) hypertension: Secondary | ICD-10-CM | POA: Diagnosis not present

## 2022-12-28 DIAGNOSIS — I7121 Aneurysm of the ascending aorta, without rupture: Secondary | ICD-10-CM | POA: Diagnosis not present

## 2022-12-28 DIAGNOSIS — I272 Pulmonary hypertension, unspecified: Secondary | ICD-10-CM | POA: Insufficient documentation

## 2022-12-28 DIAGNOSIS — I350 Nonrheumatic aortic (valve) stenosis: Secondary | ICD-10-CM

## 2022-12-28 DIAGNOSIS — I352 Nonrheumatic aortic (valve) stenosis with insufficiency: Secondary | ICD-10-CM | POA: Diagnosis not present

## 2022-12-28 HISTORY — PX: RIGHT/LEFT HEART CATH AND CORONARY ANGIOGRAPHY: CATH118266

## 2022-12-28 LAB — POCT I-STAT EG7
Acid-base deficit: 1 mmol/L (ref 0.0–2.0)
Acid-base deficit: 2 mmol/L (ref 0.0–2.0)
Bicarbonate: 24.4 mmol/L (ref 20.0–28.0)
Bicarbonate: 23.8 mmol/L (ref 20.0–28.0)
Calcium, Ion: 1.31 mmol/L (ref 1.15–1.40)
Calcium, Ion: 1.28 mmol/L (ref 1.15–1.40)
HCT: 40 % (ref 39.0–52.0)
HCT: 40 % (ref 39.0–52.0)
Hemoglobin: 13.6 g/dL (ref 13.0–17.0)
Hemoglobin: 13.6 g/dL (ref 13.0–17.0)
O2 Saturation: 76 %
O2 Saturation: 76 %
Potassium: 4.1 mmol/L (ref 3.5–5.1)
Potassium: 4 mmol/L (ref 3.5–5.1)
Sodium: 142 mmol/L (ref 135–145)
Sodium: 142 mmol/L (ref 135–145)
TCO2: 26 mmol/L (ref 22–32)
TCO2: 25 mmol/L (ref 22–32)
pCO2, Ven: 42.8 mmHg — ABNORMAL LOW (ref 44–60)
pCO2, Ven: 42 mmHg — ABNORMAL LOW (ref 44–60)
pH, Ven: 7.364 (ref 7.25–7.43)
pH, Ven: 7.361 (ref 7.25–7.43)
pO2, Ven: 43 mmHg (ref 32–45)
pO2, Ven: 43 mmHg (ref 32–45)

## 2022-12-28 LAB — POCT I-STAT 7, (LYTES, BLD GAS, ICA,H+H)
Acid-base deficit: 1 mmol/L (ref 0.0–2.0)
Bicarbonate: 23.6 mmol/L (ref 20.0–28.0)
Calcium, Ion: 1.29 mmol/L (ref 1.15–1.40)
HCT: 39 % (ref 39.0–52.0)
Hemoglobin: 13.3 g/dL (ref 13.0–17.0)
O2 Saturation: 95 %
Potassium: 4.1 mmol/L (ref 3.5–5.1)
Sodium: 142 mmol/L (ref 135–145)
TCO2: 25 mmol/L (ref 22–32)
pCO2 arterial: 38.9 mmHg (ref 32–48)
pH, Arterial: 7.392 (ref 7.35–7.45)
pO2, Arterial: 78 mmHg — ABNORMAL LOW (ref 83–108)

## 2022-12-28 SURGERY — RIGHT/LEFT HEART CATH AND CORONARY ANGIOGRAPHY
Anesthesia: LOCAL

## 2022-12-28 MED ORDER — HEPARIN SODIUM (PORCINE) 1000 UNIT/ML IJ SOLN
INTRAMUSCULAR | Status: AC
  Filled 2022-12-28: qty 10

## 2022-12-28 MED ORDER — ONDANSETRON HCL 4 MG/2ML IJ SOLN: 4.0000 mg | Freq: Four times a day (QID) | INTRAMUSCULAR | Status: DC | PRN

## 2022-12-28 MED ORDER — SODIUM CHLORIDE 0.9% FLUSH: 3.0000 mL | INTRAVENOUS | Status: DC | PRN

## 2022-12-28 MED ORDER — HEPARIN (PORCINE) IN NACL 1000-0.9 UT/500ML-% IV SOLN
INTRAVENOUS | Status: DC | PRN
  Administered 2022-12-28: 500 mL

## 2022-12-28 MED ORDER — LIDOCAINE HCL (PF) 1 % IJ SOLN
INTRAMUSCULAR | Status: DC | PRN
  Administered 2022-12-28 (×2): 2 mL

## 2022-12-28 MED ORDER — MIDAZOLAM HCL 2 MG/2ML IJ SOLN
INTRAMUSCULAR | Status: AC
  Filled 2022-12-28: qty 2

## 2022-12-28 MED ORDER — LIDOCAINE HCL (PF) 1 % IJ SOLN
INTRAMUSCULAR | Status: AC
  Filled 2022-12-28: qty 30

## 2022-12-28 MED ORDER — VERAPAMIL HCL 2.5 MG/ML IV SOLN
INTRAVENOUS | Status: DC | PRN
  Administered 2022-12-28: 10 mL via INTRA_ARTERIAL

## 2022-12-28 MED ORDER — ACETAMINOPHEN 325 MG PO TABS: 650.0000 mg | ORAL_TABLET | ORAL | Status: DC | PRN

## 2022-12-28 MED ORDER — VERAPAMIL HCL 2.5 MG/ML IV SOLN
INTRAVENOUS | Status: AC
  Filled 2022-12-28: qty 2

## 2022-12-28 MED ORDER — IOHEXOL 350 MG/ML SOLN
INTRAVENOUS | Status: DC | PRN
  Administered 2022-12-28: 61 mL

## 2022-12-28 MED ORDER — FENTANYL CITRATE (PF) 100 MCG/2ML IJ SOLN
INTRAMUSCULAR | Status: DC | PRN
  Administered 2022-12-28: 25 ug via INTRAVENOUS

## 2022-12-28 MED ORDER — SODIUM CHLORIDE 0.9 % IV SOLN: 250.0000 mL | INTRAVENOUS | Status: DC | PRN

## 2022-12-28 MED ORDER — SODIUM CHLORIDE 0.9 % WEIGHT BASED INFUSION
3.0000 mL/kg/h | INTRAVENOUS | Status: AC
  Administered 2022-12-28: 3 mL/kg/h via INTRAVENOUS

## 2022-12-28 MED ORDER — HEPARIN SODIUM (PORCINE) 1000 UNIT/ML IJ SOLN
INTRAMUSCULAR | Status: DC | PRN
  Administered 2022-12-28: 4500 [IU] via INTRAVENOUS

## 2022-12-28 MED ORDER — MIDAZOLAM HCL 2 MG/2ML IJ SOLN
INTRAMUSCULAR | Status: DC | PRN
  Administered 2022-12-28: 2 mg via INTRAVENOUS

## 2022-12-28 MED ORDER — SODIUM CHLORIDE 0.9 % WEIGHT BASED INFUSION: 1.0000 mL/kg/h | INTRAVENOUS | Status: DC

## 2022-12-28 MED ORDER — FENTANYL CITRATE (PF) 100 MCG/2ML IJ SOLN
INTRAMUSCULAR | Status: AC
  Filled 2022-12-28: qty 2

## 2022-12-28 MED ORDER — SODIUM CHLORIDE 0.9% FLUSH: 3.0000 mL | Freq: Two times a day (BID) | INTRAVENOUS | Status: DC

## 2022-12-28 MED ORDER — ASPIRIN 81 MG PO CHEW: 81.0000 mg | CHEWABLE_TABLET | ORAL | Status: DC

## 2022-12-28 SURGICAL SUPPLY — 15 items
BAG SNAP BAND KOVER 36X36 (MISCELLANEOUS) IMPLANT
CATH 5FR JL3.5 JR4 ANG PIG MP (CATHETERS) IMPLANT
CATH BALLN WEDGE 5F 110CM (CATHETERS) IMPLANT
CATH INFINITI 5 FR 3DRC (CATHETERS) IMPLANT
CATH LAUNCHER 5F RADR (CATHETERS) IMPLANT
CATHETER LAUNCHER 5F RADR (CATHETERS) ×1
DEVICE RAD COMP TR BAND LRG (VASCULAR PRODUCTS) IMPLANT
GLIDESHEATH SLEND SS 6F .021 (SHEATH) IMPLANT
GUIDEWIRE INQWIRE 1.5J.035X260 (WIRE) IMPLANT
INQWIRE 1.5J .035X260CM (WIRE) ×1
PACK CARDIAC CATHETERIZATION (CUSTOM PROCEDURE TRAY) ×1 IMPLANT
PROTECTION STATION PRESSURIZED (MISCELLANEOUS) ×1
SET ATX-X65L (MISCELLANEOUS) IMPLANT
SHEATH GLIDE SLENDER 4/5FR (SHEATH) IMPLANT
STATION PROTECTION PRESSURIZED (MISCELLANEOUS) IMPLANT

## 2022-12-28 NOTE — Progress Notes (Signed)
   12/28/22 1445  DRESSING  Dressing Site Right radial TR band removed, gauze dressing applied  Dressing Status Clean;Dry;Intact

## 2022-12-28 NOTE — Discharge Instructions (Addendum)

## 2022-12-28 NOTE — Interval H&P Note (Signed)
History and Physical Interval Note:  12/28/2022 12:06 PM  Johnathan Anderson  has presented today for surgery, with the diagnosis of aortic stenosis.  The various methods of treatment have been discussed with the patient and family. After consideration of risks, benefits and other options for treatment, the patient has consented to  Procedure(s): RIGHT/LEFT HEART CATH AND CORONARY ANGIOGRAPHY (N/A) as a surgical intervention.  The patient's history has been reviewed, patient examined, no change in status, stable for surgery.  I have reviewed the patient's chart and labs.  Questions were answered to the patient's satisfaction.     Theron Arista [facility] 12/28/2022 12:06 PM

## 2022-12-29 ENCOUNTER — Encounter (HOSPITAL_COMMUNITY): Payer: Self-pay | Admitting: Cardiology

## 2023-01-03 ENCOUNTER — Telehealth: Payer: Self-pay | Admitting: Adult Health

## 2023-01-03 ENCOUNTER — Inpatient Hospital Stay: Payer: Commercial Managed Care - HMO | Admitting: Adult Health

## 2023-01-03 NOTE — Telephone Encounter (Signed)
Rescheduled appointment per patient via incoming call. Patient is aware of the changes made to his upcoming appointment.

## 2023-01-09 ENCOUNTER — Encounter: Payer: Commercial Managed Care - HMO | Admitting: Thoracic Surgery (Cardiothoracic Vascular Surgery)

## 2023-01-30 ENCOUNTER — Inpatient Hospital Stay: Payer: Commercial Managed Care - HMO | Attending: Adult Health | Admitting: Adult Health

## 2023-01-30 ENCOUNTER — Encounter: Payer: Self-pay | Admitting: Adult Health

## 2023-01-30 VITALS — BP 140/74 | HR 55 | Temp 97.9°F | Resp 16 | Wt 204.3 lb

## 2023-01-30 DIAGNOSIS — C4402 Squamous cell carcinoma of skin of lip: Secondary | ICD-10-CM

## 2023-01-30 DIAGNOSIS — Z85819 Personal history of malignant neoplasm of unspecified site of lip, oral cavity, and pharynx: Secondary | ICD-10-CM | POA: Diagnosis present

## 2023-01-30 DIAGNOSIS — Z923 Personal history of irradiation: Secondary | ICD-10-CM | POA: Insufficient documentation

## 2023-01-30 NOTE — Progress Notes (Signed)
CLINIC:  Survivorship  REASON FOR VISIT:  Routine follow-up for history of head & neck cancer.  BRIEF ONCOLOGIC HISTORY:  Oncology History  Squamous cell carcinoma, lip  06/23/2022 Surgery    Mohs of the mid lower lip lesion. 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, AJCC 8th Edition - Pathologic stage from 07/06/2022: Stage III (pT3, pN0, cM0) - Signed by Lonie Peak, MD on 07/06/2022 Stage prefix: Initial diagnosis Extraosseous extension: Absent   08/01/2022 - 08/26/2022 Radiation Therapy   Plan Name: HN_Low_Lip Site: Lip, Lower Technique: Electron Mode: Electron Dose Per Fraction: 2.5 Gy Prescribed Dose (Delivered / Prescribed): 50 Gy / 50 Gy Prescribed Fxs (Delivered / Prescribed): 20 / 20                INTERVAL HISTORY:  Johnathan Anderson is here today for f/u of his 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  He is healing well and denies any issues.    -Pain: none -Nutrition/Diet: regular -Dysphagia?: none -Dental issues?: none -Last TSH: n/a -Weight: stable  ADDITIONAL REVIEW OF SYSTEMS:  Review of Systems  Constitutional:  Negative for appetite change, chills, fatigue, fever and unexpected weight change.  HENT:   Negative for hearing loss, lump/mass and trouble swallowing.   Eyes:  Negative for eye problems and icterus.  Respiratory:  Negative for chest tightness, cough and shortness of breath.   Cardiovascular:  Negative for chest pain, leg swelling and palpitations.  Gastrointestinal:  Negative for abdominal distention, abdominal pain, constipation, diarrhea, nausea and vomiting.  Endocrine: Negative for hot flashes.  Genitourinary:  Negative for difficulty urinating.   Musculoskeletal:  Negative for arthralgias.  Skin:  Negative for itching  and rash.  Neurological:  Negative for dizziness, extremity weakness, headaches and numbness.  Hematological:  Negative for adenopathy. Does not bruise/bleed easily.  Psychiatric/Behavioral:  Negative for depression. The patient is not nervous/anxious.       CURRENT MEDICATIONS:  Current Outpatient Medications on File Prior to Visit  Medication Sig Dispense Refill   amLODipine (NORVASC) 5 MG tablet Take 1 tablet (5 mg total) by mouth daily. 90 tablet 3   calcium carbonate (TUMS) 500 MG chewable tablet as needed for indigestion or heartburn.     diclofenac Sodium (VOLTAREN) 1 % GEL Apply 1 application topically in the morning, at noon, and at bedtime.     omeprazole (PRILOSEC) 20 MG capsule Take 20 mg by mouth every morning.     ondansetron (ZOFRAN-ODT) 4 MG disintegrating tablet Take 1-2 tablets (4-8 mg total) by mouth every 8 (eight) hours as needed. 30 tablet 3   rizatriptan (MAXALT-MLT) 10 MG disintegrating tablet Take 1 tablet (10 mg total) by mouth as needed for migraine. May repeat in 2 hours if needed 9 tablet 11   rosuvastatin (CRESTOR) 10 MG tablet Take 1 tablet (10 mg total) by mouth daily. 90 tablet 3   topiramate (TOPAMAX) 50 MG tablet Take 1 tablet (50 mg total) by mouth at bedtime. 90 tablet 4   No current facility-administered medications on file prior to visit.    ALLERGIES:  Allergies  Allergen Reactions   Levaquin [Levofloxacin]     Aortic Aneurysm     PHYSICAL EXAM:  Vitals:   01/30/23 1425  BP: (!) 140/74  Pulse: (!) 55  Resp: 16  Temp: 97.9 F (36.6 C)  SpO2: 100%   Filed Weights   01/30/23 1425  Weight: 204 lb 4.8 oz (92.7 kg)     General: Well-nourished, well-appearing male in no acute distress.  Unaccompanied today.  HEENT: Head is atraumatic and normocephalic.  Pupils equal and reactive to light. Conjunctivae clear without exudate.  Sclerae anicteric. Oral mucosa is pink and moist without lesions.  Tongue pink, moist, and midline. Oropharynx is  pink and moist, without lesions. Lymph: No preauricular, postauricular, cervical, supraclavicular, or infraclavicular lymphadenopathy noted on palpation.   Neck: No palpable masses.  Cardiovascular: Normal rate and rhythm. Respiratory: Clear to auscultation bilaterally. Chest expansion symmetric without accessory muscle use; breathing non-labored.  GI: Abdomen soft and round. Non-tender, non-distended. Bowel sounds normoactive.  GU: Deferred.   Neuro: No focal deficits. Steady gait.   Psych: Normal mood and affect for situation. Extremities: No edema.  Skin: Warm and dry.    LABORATORY DATA:  None at this visit.  DIAGNOSTIC IMAGING:  None at this visit.    ASSESSMENT & PLAN:  Johnathan Anderson is a pleasant 57 y.o. 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  Patient presents to survivorship clinic today for routine follow-up after finishing treatment.   1. Squamous cell carcinoma:  Johnathan Anderson is clinically without evidence of disease or recurrence on physical exam today.  We discussed the importance of skin protection with sunscreen and a wide brimmed hat and to avoid direct sunlight during peak hours.   2. Nutritional status: Johnathan Anderson reports that he is currently able to consume adequate nutrition by mouth  3. Health maintenance and wellness promotion: Cancer patients who consume a diet rich in fruits and vegetables have better overall health and decreased risk of cancer recurrence. Johnathan Anderson was encouraged to consume 5-7 servings of fruits and vegetables per day, as tolerated. Johnathan Anderson was also encouraged to engage in moderate to vigorous exercise for 30 minutes per day most days of the week.   4. Support services/counseling: It is not uncommon for this period of the patient's cancer care trajectory to be one of many emotions and stressors.  We discussed an opportunity for him to participate in the next session of  Head & Neck FYNN ("Finding Your New Normal") support group series, designed for patients after they have completed treatment.   Johnathan Anderson was encouraged to take advantage of our many other support services programs, support groups, and/or counseling in coping with his new life as a cancer survivor after completing anti-cancer treatment.     Dispo:  -Return to cancer center to see Survivorship NP in 6 months -Return to see Dr. Basilio Cairo PRN.   A total of 30 minutes was spent in the face-to-face care of this patient, with greater than 50% of that time spent in counseling and care-coordination.    Larna Daughters, NP Survivorship Program [facility] [phone]

## 2023-02-03 ENCOUNTER — Telehealth: Payer: Self-pay

## 2023-02-03 NOTE — Telephone Encounter (Signed)
Received the following message from our scheduler: "Can you reach out to this patient he is stating that Johnathan Anderson prescribed him some medication a few days ago and he says he's having a reaction."   Attempted to call pt and LVM for call back.

## 2023-02-12 NOTE — Progress Notes (Unsigned)
[address]       [place] [address]             [phone]           Johnathan Anderson #[id number] Date of Birth: 08-04-1965  Gardiner Sleeper, MD  Chief Complaint:Bicuspid aortic stenosis and ascending aortic aneurysm     History of Present Illness:     Pt is a pleasant 57 yo male who has been followed for progressive bicuspid aortic stenosis. Pt has had a recent echo with a mean gradient of and with normal LV function. Pt has not had any symptoms of exertional CP, SOB, lightheadedness or paplitations. He has positional discomfort when lying of left side but has frozen shoulder and feels hard to determine the source. He has also been followed for a moderate ascending aortic aneurysm which has been measured at 4.6 cm on a CTA from this January and on recent echo also estimated at 4.5cm.  He has had a cath without CAD or PHTN.His brother died suddenly from some heart related issue at age 36 but has no autopsy to confirm cause. Wife is concerned that he take care of this issue now.      Past Medical History:  Diagnosis Date   Aortic aneurysm (HCC)    GERD (gastroesophageal reflux disease)    Hyperlipidemia    Vertigo     Past Surgical History:  Procedure Laterality Date   APPENDECTOMY     EYE SURGERY     RIGHT/LEFT HEART CATH AND CORONARY ANGIOGRAPHY N/A 12/28/2022   Procedure: RIGHT/LEFT HEART CATH AND CORONARY ANGIOGRAPHY;  Surgeon: [place], Peter M, MD;  Location: [facility] INVASIVE CV LAB;  Service: Cardiovascular;  Laterality: N/A;   STERIOD INJECTION  09/16/2011   Procedure: MINOR STEROID INJECTION;  Surgeon: Mat Carne, MD;  Location: [facility];  Service: Orthopedics;  Laterality: Left;  Left Lumbar Five-Sacral One Transforaminal Epidural Steroid Injection   TEE WITHOUT CARDIOVERSION N/A 11/05/2020   Procedure: TRANSESOPHAGEAL ECHOCARDIOGRAM (TEE);  Surgeon: Christell Constant, MD;   Location: [facility];  Service: Cardiovascular;  Laterality: N/A;    Social History   Tobacco Use  Smoking Status Former  Smokeless Tobacco Never    Social History   Substance and Sexual Activity  Alcohol Use No    Social History   Socioeconomic History   Marital status: Married    Spouse name: Not on file   Number of children: Not on file   Years of education: Not on file   Highest education level: Not on file  Occupational History   Not on file  Tobacco Use   Smoking status: Former   Smokeless tobacco: Never  Substance and Sexual Activity   Alcohol use: No   Drug use: No   Sexual activity: Not on file  Other Topics Concern   Not on file  Social History Narrative   Not on file   Social Determinants of Health   Financial Resource Strain: Medium Risk (08/12/2022)   Overall Financial Resource Strain (CARDIA)    Difficulty of Paying Living Expenses: Somewhat hard  Food Insecurity: No Food Insecurity (07/06/2022)   Hunger Vital Sign    Worried About Running Out of Food in the Last Year: Never true    Ran Out of Food in the Last Year: Never true  Transportation Needs: No Transportation Needs (07/06/2022)   PRAPARE - Transportation    Lack  of Transportation (Medical): No    Lack of Transportation (Non-Medical): No  Physical Activity: Not on file  Stress: Not on file  Social Connections: Not on file  Intimate Partner Violence: Not At Risk (07/06/2022)   Humiliation, Afraid, Rape, and Kick questionnaire    Fear of Current or Ex-Partner: No    Emotionally Abused: No    Physically Abused: No    Sexually Abused: No    Allergies  Allergen Reactions   Levaquin [Levofloxacin]     Aortic Aneurysm    Current Outpatient Medications  Medication Sig Dispense Refill   amLODipine (NORVASC) 5 MG tablet Take 1 tablet (5 mg total) by mouth daily. 90 tablet 3   calcium carbonate (TUMS) 500 MG chewable tablet as needed for indigestion or heartburn.     diclofenac Sodium  (VOLTAREN) 1 % GEL Apply 1 application topically in the morning, at noon, and at bedtime.     omeprazole (PRILOSEC) 20 MG capsule Take 20 mg by mouth every morning.     ondansetron (ZOFRAN-ODT) 4 MG disintegrating tablet Take 1-2 tablets (4-8 mg total) by mouth every 8 (eight) hours as needed. 30 tablet 3   rizatriptan (MAXALT-MLT) 10 MG disintegrating tablet Take 1 tablet (10 mg total) by mouth as needed for migraine. May repeat in 2 hours if needed 9 tablet 11   rosuvastatin (CRESTOR) 10 MG tablet Take 1 tablet (10 mg total) by mouth daily. 90 tablet 3   topiramate (TOPAMAX) 50 MG tablet Take 1 tablet (50 mg total) by mouth at bedtime. 90 tablet 4   No current facility-administered medications for this visit.     Family History  Problem Relation Age of Onset   Hypertension Mother    Diabetes Father    Migraines Neg Hx        Physical Exam: Lungs: clear Card: RR with 3/6 sem Ext: warm  Neuro: intact Teeth in good repair    Diagnostic Studies & Laboratory data: I have personally reviewed the following studies and agree with the findings   CATH (12/2022) Conclusion  Normal coronary anatomy Mildly elevated LV filling pressures. PCWP 21/20 with mean 19 mm Hg. LVEDP 23 mm Hg Mild pulmonary HTN. PAP 45/17 with mean 26 mm Hg Moderate Aortic stenosis by cath. Mean AV gradient 35 mm Hg with AVA 1.38 cm squared. Index 0.69. Cath data may underestimate severity of stenosis. Normal cardiac output 6.74 L/min, index 3.36   Plan: surgical consultation   TTE (12/2022) IMPRESSIONS     1. Left ventricular ejection fraction, by estimation, is 60 to 65%. The  left ventricle has normal function. The left ventricle has no regional  wall motion abnormalities. Left ventricular diastolic parameters were  normal.   2. Right ventricular systolic function is normal. The right ventricular  size is normal.   3. Left atrial size was moderately dilated.   4. The mitral valve is abnormal. Trivial  mitral valve regurgitation. No  evidence of mitral stenosis.   5. The aortic valve is bicuspid. There is severe calcifcation of the  aortic valve. There is severe thickening of the aortic valve. Aortic valve  regurgitation is mild. Severe aortic valve stenosis.   6. Aortic dilatation noted. There is moderate dilatation of the ascending  aorta, measuring 45 mm.   7. The inferior vena cava is normal in size with greater than 50%  respiratory variability, suggesting right atrial pressure of 3 mmHg.   FINDINGS   Left Ventricle: Left ventricular ejection fraction, by  estimation, is 60  to 65%. The left ventricle has normal function. The left ventricle has no  regional wall motion abnormalities. The left ventricular internal cavity  size was normal in size. There is   no left ventricular hypertrophy. Left ventricular diastolic parameters  were normal.   Right Ventricle: The right ventricular size is normal. No increase in  right ventricular wall thickness. Right ventricular systolic function is  normal.   Left Atrium: Left atrial size was moderately dilated.   Right Atrium: Right atrial size was normal in size.   Pericardium: There is no evidence of pericardial effusion.   Mitral Valve: The mitral valve is abnormal. There is mild thickening of  the mitral valve leaflet(s). There is mild calcification of the mitral  valve leaflet(s). Trivial mitral valve regurgitation. No evidence of  mitral valve stenosis.   Tricuspid Valve: The tricuspid valve is normal in structure. Tricuspid  valve regurgitation is mild . No evidence of tricuspid stenosis.   Aortic Valve: The aortic valve is bicuspid. There is severe calcifcation  of the aortic valve. There is severe thickening of the aortic valve.  Aortic valve regurgitation is mild. Severe aortic stenosis is present.  Aortic valve mean gradient measures 39.0  mmHg. Aortic valve peak gradient measures 65.3 mmHg. Aortic valve area, by  VTI  measures 0.83 cm.   Pulmonic Valve: The pulmonic valve was normal in structure. Pulmonic valve  regurgitation is mild. No evidence of pulmonic stenosis.   Aorta: Aortic dilatation noted. There is moderate dilatation of the  ascending aorta, measuring 45 mm.   Venous: The inferior vena cava is normal in size with greater than 50%  respiratory variability, suggesting right atrial pressure of 3 mmHg.   IAS/Shunts: No atrial level shunt detected by color flow Doppler.     LEFT VENTRICLE  PLAX 2D  LVIDd:         4.40 cm   Diastology  LVIDs:         2.60 cm   LV e' medial:    9.46 cm/s  LV PW:         0.90 cm   LV E/e' medial:  9.4  LV IVS:        0.80 cm   LV e' lateral:   16.30 cm/s  LVOT diam:     2.00 cm   LV E/e' lateral: 5.5  LV SV:         83  LV SV Index:   41  LVOT Area:     3.14 cm                             3D Volume EF:                           3D EF:        59 %                           LV EDV:       125 ml                           LV ESV:       51 ml  LV SV:        73 ml   RIGHT VENTRICLE  RV Basal diam:  2.40 cm  RV Mid diam:    2.60 cm  RV S prime:     10.40 cm/s  TAPSE (M-mode): 1.6 cm  RVSP:           26.8 mmHg   LEFT ATRIUM             Index        RIGHT ATRIUM           Index  LA diam:        4.30 cm 2.15 cm/m   RA Pressure: 3.00 mmHg  LA Vol (A2C):   52.7 ml 26.31 ml/m  RA Area:     8.89 cm  LA Vol (A4C):   38.1 ml 19.02 ml/m  RA Volume:   13.90 ml  6.94 ml/m  LA Biplane Vol: 45.7 ml 22.81 ml/m   AORTIC VALVE  AV Area (Vmax):    0.76 cm  AV Area (Vmean):   0.75 cm  AV Area (VTI):     0.83 cm  AV Vmax:           404.00 cm/s  AV Vmean:          290.000 cm/s  AV VTI:            0.990 m  AV Peak Grad:      65.3 mmHg  AV Mean Grad:      39.0 mmHg  LVOT Vmax:         97.40 cm/s  LVOT Vmean:        69.300 cm/s  LVOT VTI:          0.263 m  LVOT/AV VTI ratio: 0.27    AORTA  Ao Root diam: 3.00 cm  Ao Asc diam:   4.50 cm   MITRAL VALVE               TRICUSPID VALVE  MV Area (PHT): 2.33 cm    TR Peak grad:   23.8 mmHg  MV Decel Time: 326 msec    TR Vmax:        244.00 cm/s  MV E velocity: 89.10 cm/s  Estimated RAP:  3.00 mmHg  MV A velocity: 79.70 cm/s  RVSP:           26.8 mmHg  MV E/A ratio:  1.12                             SHUNTS                             Systemic VTI:  0.26 m                             Systemic Diam: 2.00 cm    Recent Radiology Findings:   CTA (06/2022) FINDINGS: Cardiovascular:   Aortic Root:   --Valve: 2.7 x 2.1 cm, previously 2.7 x 2.3 cm   --Sinuses: 2.6 x 2.4 x 2.3 cm, previously 2.8 x 2.7 x 2.6 cm   --Sinotubular Junction: 2.6 x 2.6 cm, previously 2.7 x 2.5 cm   Limitations by motion: Moderate   Thoracic Aorta:   --Ascending Aorta: 4.6  X 4.4 cm, previously 4.6 x 4.6 cm   --Aortic Arch: 3.3 x 3.3  cm, previously 3.2 x 3.0 cm   --Descending Aorta: 2.7 x 2.5 cm cm, previously 2.6 x 2.6 cm   Other:   Normal heart size. No pericardial effusion.   Mediastinum/Nodes: Imaged thyroid gland without nodules meeting criteria for imaging follow-up by size. Normal esophagus. No pathologically enlarged axillary, supraclavicular, mediastinal, or hilar lymph nodes.   Lungs/Pleura: The central airways are patent. No focal consolidation. No pneumothorax. No pleural effusion.   Upper abdomen: Normal.   Musculoskeletal: No acute or abnormal lytic or blastic osseous lesions.   Review of the MIP images confirms the above findings.   IMPRESSION: 1. Unchanged ascending thoracic aortic aneurysm measuring up to 4.6 cm. Ascending thoracic aortic aneurysm. Recommend semi-annual imaging followup by CTA or MRA and referral to cardiothoracic surgery if not already obtained. This recommendation follows 2010 ACCF/AHA/AATS/ACR/ASA/SCA/SCAI/SIR/STS/SVM Guidelines for the Diagnosis and Management of Patients With Thoracic Aortic Disease. Circulation. 2010; 121: Z610-R604.  Aortic aneurysm NOS (ICD10-I71.9) 2. No acute abnormality in the chest. No suspicious pulmonary nodules or masses.        Recent Lab Findings: Lab Results  Component Value Date   WBC 6.8 12/26/2022   HGB 13.6 12/28/2022   HGB 13.6 12/28/2022   HCT 40.0 12/28/2022   HCT 40.0 12/28/2022   PLT 157 12/26/2022   GLUCOSE 101 (H) 12/26/2022   CHOL 133 12/20/2021   TRIG 116 12/20/2021   HDL 40 12/20/2021   LDLCALC 72 12/20/2021   ALT 44 12/20/2021   AST 29 09/13/2021   NA 142 12/28/2022   NA 142 12/28/2022   K 4.1 12/28/2022   K 4.0 12/28/2022   CL 102 12/26/2022   CREATININE 1.13 12/26/2022   BUN 21 12/26/2022   CO2 25 12/26/2022      Assessment / Plan:     57 yo male with now severe bicuspid aortic stenosis with normal LV function and without CAD or PHTN. Pt with NYHA class 0 symptoms. Pt feels concerned with waiting especially knowing that this will progress over time and he can wait till symptoms start, but overall feels to get out of the way now. We discussed all the risks and goals of surgery (including ascending aortic replacement) and options for mechanical vs tissue valve and he wishes to have a tissue valve replacement to avoid coumadin. We discussed recovery and they wish to have near the thanksgiving holiday for his wife to have off from work form school teaching to be home with him.    I have spent 60 min in review of the records, viewing studies and in face to face with patient and in coordination of future care    Johnathan Anderson 02/12/2023 5:25 PM

## 2023-02-13 ENCOUNTER — Institutional Professional Consult (permissible substitution): Payer: Commercial Managed Care - HMO | Admitting: Thoracic Surgery (Cardiothoracic Vascular Surgery)

## 2023-02-13 ENCOUNTER — Encounter: Payer: Self-pay | Admitting: *Deleted

## 2023-02-13 ENCOUNTER — Encounter: Payer: Self-pay | Admitting: Thoracic Surgery (Cardiothoracic Vascular Surgery)

## 2023-02-13 ENCOUNTER — Other Ambulatory Visit: Payer: Self-pay | Admitting: *Deleted

## 2023-02-13 VITALS — BP 146/86 | HR 56 | Resp 18 | Ht 66.0 in | Wt 205.0 lb

## 2023-02-13 DIAGNOSIS — I350 Nonrheumatic aortic (valve) stenosis: Secondary | ICD-10-CM | POA: Diagnosis not present

## 2023-02-13 DIAGNOSIS — I7121 Aneurysm of the ascending aorta, without rupture: Secondary | ICD-10-CM

## 2023-02-13 DIAGNOSIS — Q231 Congenital insufficiency of aortic valve: Secondary | ICD-10-CM

## 2023-02-13 NOTE — Patient Instructions (Signed)
Tissue AVR with ascending aortic replacement on 11/20

## 2023-02-22 ENCOUNTER — Encounter (HOSPITAL_COMMUNITY): Payer: Commercial Managed Care - HMO

## 2023-03-02 ENCOUNTER — Telehealth: Payer: Self-pay

## 2023-03-02 NOTE — Telephone Encounter (Signed)
FMLA completed per patient's request. Pt sx sith Dr. Leafy Ro on 11/20. Approx RTW date 07/19/23. Patient to pick up forms at the front desk. Copy made for office. Patient aware.

## 2023-03-07 ENCOUNTER — Encounter: Payer: Self-pay | Admitting: Neurology

## 2023-04-21 NOTE — Progress Notes (Signed)
Surgical Instructions   Your procedure is scheduled on Wednesday, 04/26/23. Report to [facility] Main Entrance "A" at 6:30 A.M., then check in with the Admitting office. Any questions or running late day of surgery: call [phone]  Questions prior to your surgery date: call [phone], Monday-Friday, 8am-4pm. If you experience any cold or flu symptoms such as cough, fever, chills, shortness of breath, etc. between now and your scheduled surgery, please notify us at the above number.     Remember:  Do not eat or drink after midnight the night before your surgery    Take these medicines the morning of surgery with A SIP OF WATER  omeprazole (PRILOSEC)   May take these medicines IF NEEDED: rizatriptan (MAXALT-MLT)  ondansetron (ZOFRAN-ODT)   One week prior to surgery, STOP taking any Aspirin (unless otherwise instructed by your surgeon) Aleve, Naproxen, Ibuprofen, Motrin, Advil, Goody's, BC's, all herbal medications, fish oil, and non-prescription vitamins.                     Do NOT Smoke (Tobacco/Vaping) for 24 hours prior to your procedure.  If you use a CPAP at night, you may bring your mask/headgear for your overnight stay.   You will be asked to remove any contacts, glasses, piercing's, hearing aid's, dentures/partials prior to surgery. Please bring cases for these items if needed.    Patients discharged the day of surgery will not be allowed to drive home, and someone needs to stay with them for 24 hours.  SURGICAL WAITING ROOM VISITATION Patients may have no more than 2 support people in the waiting area - these visitors may rotate.   Pre-op nurse will coordinate an appropriate time for 1 ADULT support person, who may not rotate, to accompany patient in pre-op.  Children under the age of 12 must have an adult with them who is not the patient and must remain in the main waiting area with an adult.  If the patient needs to stay at the hospital during part of their  recovery, the visitor guidelines for inpatient rooms apply.  Please refer to the [facility] website for the visitor guidelines for any additional information.   If you received a COVID test during your pre-op visit  it is requested that you wear a mask when out in public, stay away from anyone that may not be feeling well and notify your surgeon if you develop symptoms. If you have been in contact with anyone that has tested positive in the last 10 days please notify you surgeon.      Pre-operative CHG Bathing Instructions   You can play a key role in reducing the risk of infection after surgery. Your skin needs to be as free of germs as possible. You can reduce the number of germs on your skin by washing with CHG (chlorhexidine gluconate) soap before surgery. CHG is an antiseptic soap that kills germs and continues to kill germs even after washing.   DO NOT use if you have an allergy to chlorhexidine/CHG or antibacterial soaps. If your skin becomes reddened or irritated, stop using the CHG and notify one of our RNs at [phone].              TAKE A SHOWER THE NIGHT BEFORE SURGERY AND THE DAY OF SURGERY    Please keep in mind the following:  DO NOT shave, including legs and underarms, 48 hours prior to surgery.   You may shave your face before/day of surgery.  Place clean sheets on your bed the night before surgery Use a clean washcloth (not used since being washed) for each shower. DO NOT sleep with pet's night before surgery.  CHG Shower Instructions:  Wash your face and private area with normal soap. If you choose to wash your hair, wash first with your normal shampoo.  After you use shampoo/soap, rinse your hair and body thoroughly to remove shampoo/soap residue.  Turn the water OFF and apply half the bottle of CHG soap to a CLEAN washcloth.  Apply CHG soap ONLY FROM YOUR NECK DOWN TO YOUR TOES (washing for 3-5 minutes)  DO NOT use CHG soap on face, private areas, open wounds,  or sores.  Pay special attention to the area where your surgery is being performed.  If you are having back surgery, having someone wash your back for you may be helpful. Wait 2 minutes after CHG soap is applied, then you may rinse off the CHG soap.  Pat dry with a clean towel  Put on clean pajamas    Additional instructions for the day of surgery: DO NOT APPLY any lotions, deodorants, cologne, or perfumes.   Do not wear jewelry or makeup Do not wear nail polish, gel polish, artificial nails, or any other type of covering on natural nails (fingers and toes) Do not bring valuables to the hospital. [facility] is not responsible for valuables/personal belongings. Put on clean/comfortable clothes.  Please brush your teeth.  Ask your nurse before applying any prescription medications to the skin.

## 2023-04-24 ENCOUNTER — Encounter (HOSPITAL_COMMUNITY): Payer: Self-pay

## 2023-04-24 ENCOUNTER — Ambulatory Visit (HOSPITAL_BASED_OUTPATIENT_CLINIC_OR_DEPARTMENT_OTHER)
Admission: RE | Admit: 2023-04-24 | Discharge: 2023-04-24 | Disposition: A | Discharge status: Home / Self Care | Payer: Commercial Managed Care - HMO | Source: Ambulatory Visit | Attending: Thoracic Surgery (Cardiothoracic Vascular Surgery) | Admitting: Thoracic Surgery (Cardiothoracic Vascular Surgery)

## 2023-04-24 ENCOUNTER — Encounter (HOSPITAL_COMMUNITY)
Admission: RE | Admit: 2023-04-24 | Discharge: 2023-04-24 | Disposition: A | Discharge status: Home / Self Care | Payer: Commercial Managed Care - HMO | Source: Ambulatory Visit | Attending: Thoracic Surgery (Cardiothoracic Vascular Surgery) | Admitting: Thoracic Surgery (Cardiothoracic Vascular Surgery)

## 2023-04-24 ENCOUNTER — Other Ambulatory Visit: Payer: Self-pay

## 2023-04-24 ENCOUNTER — Ambulatory Visit (HOSPITAL_COMMUNITY)
Admission: RE | Admit: 2023-04-24 | Discharge: 2023-04-24 | Disposition: A | Discharge status: Home / Self Care | Payer: Managed Care, Other (non HMO) | Source: Ambulatory Visit | Attending: Thoracic Surgery (Cardiothoracic Vascular Surgery)

## 2023-04-24 VITALS — BP 148/92 | HR 55 | Temp 98.2°F | Resp 18 | Ht 66.0 in | Wt 204.5 lb

## 2023-04-24 DIAGNOSIS — Z01818 Encounter for other preprocedural examination: Secondary | ICD-10-CM | POA: Insufficient documentation

## 2023-04-24 DIAGNOSIS — I350 Nonrheumatic aortic (valve) stenosis: Secondary | ICD-10-CM | POA: Insufficient documentation

## 2023-04-24 DIAGNOSIS — I7121 Aneurysm of the ascending aorta, without rupture: Secondary | ICD-10-CM | POA: Insufficient documentation

## 2023-04-24 HISTORY — DX: Headache, unspecified: R51.9

## 2023-04-24 HISTORY — DX: Essential (primary) hypertension: I10

## 2023-04-24 HISTORY — DX: Unspecified osteoarthritis, unspecified site: M19.90

## 2023-04-24 HISTORY — DX: Peripheral vascular disease, unspecified: I73.9

## 2023-04-24 HISTORY — DX: Cardiac murmur, unspecified: R01.1

## 2023-04-24 LAB — COMPREHENSIVE METABOLIC PANEL
ALT: 22 U/L (ref 0–44)
AST: 20 U/L (ref 15–41)
Albumin: 4 g/dL (ref 3.5–5.0)
Alkaline Phosphatase: 48 U/L (ref 38–126)
Anion gap: 6 (ref 5–15)
BUN: 16 mg/dL (ref 6–20)
CO2: 26 mmol/L (ref 22–32)
Calcium: 9.6 mg/dL (ref 8.9–10.3)
Chloride: 106 mmol/L (ref 98–111)
Creatinine, Ser: 1.04 mg/dL (ref 0.61–1.24)
GFR, Estimated: >60 mL/min (ref >60)
Glucose, Bld: 103 mg/dL — ABNORMAL HIGH (ref 70–99)
Potassium: 4 mmol/L (ref 3.5–5.1)
Sodium: 138 mmol/L (ref 135–145)
Total Bilirubin: 0.8 mg/dL (ref <1.2)
Total Protein: 7.4 g/dL (ref 6.5–8.1)

## 2023-04-24 LAB — APTT: aPTT: 28 s (ref 24–36)

## 2023-04-24 LAB — CBC
HCT: 43.8 % (ref 39.0–52.0)
Hemoglobin: 15 g/dL (ref 13.0–17.0)
MCH: 29.6 pg (ref 26.0–34.0)
MCHC: 34.2 g/dL (ref 30.0–36.0)
MCV: 86.6 fL (ref 80.0–100.0)
Platelets: 151 10*3/uL (ref 150–400)
RBC: 5.06 MIL/uL (ref 4.22–5.81)
RDW: 13.4 % (ref 11.5–15.5)
WBC: 6 10*3/uL (ref 4.0–10.5)
nRBC: 0 % (ref 0.0–0.2)

## 2023-04-24 LAB — HEMOGLOBIN A1C
Hgb A1c MFr Bld: 5.5 % (ref 4.8–5.6)
Mean Plasma Glucose: 111.15 mg/dL

## 2023-04-24 LAB — URINALYSIS, ROUTINE W REFLEX MICROSCOPIC
Bilirubin Urine: NEGATIVE
Glucose, UA: NEGATIVE mg/dL
Hgb urine dipstick: NEGATIVE
Ketones, ur: NEGATIVE mg/dL
Leukocytes,Ua: NEGATIVE
Nitrite: NEGATIVE
Protein, ur: NEGATIVE mg/dL
Specific Gravity, Urine: 1.012 (ref 1.005–1.030)
pH: 5 (ref 5.0–8.0)

## 2023-04-24 LAB — SURGICAL PCR SCREEN
MRSA, PCR: NEGATIVE
Staphylococcus aureus: NEGATIVE

## 2023-04-24 LAB — PROTIME-INR
INR: 1.1 (ref 0.8–1.2)
Prothrombin Time: 13.9 s (ref 11.4–15.2)

## 2023-04-24 NOTE — Progress Notes (Signed)
PCP - Dr. Irven Coe Cardiologist - Dr. Manon Hilding - Last office visit 12/26/2022  PPM/ICD - Denies Device Orders - n/a Rep Notified - n/a  Chest x-ray - 04/24/2023 EKG - 04/24/2023 Stress Test - Denies ECHO - 12/05/2022 Cardiac Cath - 12/28/2022  Sleep Study - Denies CPAP - n/a  No DM  Last dose of GLP1 agonist- n/a GLP1 instructions: n/a  Blood Thinner Instructions: n/a Aspirin Instructions: n/a  NPO after midnight  COVID TEST- Yes. Result pending.   Anesthesia review: No  Patient denies shortness of breath, fever, cough and chest pain at PAT appointment. Pt denies any respiratory illness/infection in the last two months.    All instructions explained to the patient, with a verbal understanding of the material. Patient agrees to go over the instructions while at home for a better understanding. Patient also instructed to self quarantine after being tested for COVID-19. The opportunity to ask questions was provided.

## 2023-04-24 NOTE — Progress Notes (Signed)
Unable to collect ABG at pre-op appointment. Will need to be collected DOS. Order modified.

## 2023-04-24 NOTE — Progress Notes (Signed)
Surgical Instructions   Your procedure is scheduled on Wednesday, 04/26/23. Report to [facility] Main Entrance "A" at 6:30 A.M., then check in with the Admitting office. Any questions or running late day of surgery: call [phone]  Questions prior to your surgery date: call [phone], Monday-Friday, 8am-4pm. If you experience any cold or flu symptoms such as cough, fever, chills, shortness of breath, etc. between now and your scheduled surgery, please notify us at the above number.     Remember:  Do not eat or drink after midnight the night before your surgery    Take these medicines the morning of surgery with A SIP OF WATER  omeprazole (PRILOSEC)    May take these medicines IF NEEDED: rizatriptan (MAXALT-MLT)  ondansetron (ZOFRAN-ODT)    One week prior to surgery, STOP taking any Aspirin (unless otherwise instructed by your surgeon) Aleve, Naproxen, Ibuprofen, Motrin, Advil, Goody's, BC's, all herbal medications, fish oil, and non-prescription vitamins.                     Do NOT Smoke (Tobacco/Vaping) for 24 hours prior to your procedure.  If you use a CPAP at night, you may bring your mask/headgear for your overnight stay.   You will be asked to remove any contacts, glasses, piercing's, hearing aid's, dentures/partials prior to surgery. Please bring cases for these items if needed.    Patients discharged the day of surgery will not be allowed to drive home, and someone needs to stay with them for 24 hours.  SURGICAL WAITING ROOM VISITATION Patients may have no more than 2 support people in the waiting area - these visitors may rotate.   Pre-op nurse will coordinate an appropriate time for 1 ADULT support person, who may not rotate, to accompany patient in pre-op.  Children under the age of 71 must have an adult with them who is not the patient and must remain in the main waiting area with an adult.  If the patient needs to stay at the hospital during part of their  recovery, the visitor guidelines for inpatient rooms apply.  Please refer to the [facility] website for the visitor guidelines for any additional information.   If you received a COVID test during your pre-op visit  it is requested that you wear a mask when out in public, stay away from anyone that may not be feeling well and notify your surgeon if you develop symptoms. If you have been in contact with anyone that has tested positive in the last 10 days please notify you surgeon.      Pre-operative CHG Bathing Instructions   You can play a key role in reducing the risk of infection after surgery. Your skin needs to be as free of germs as possible. You can reduce the number of germs on your skin by washing with CHG (chlorhexidine gluconate) soap before surgery. CHG is an antiseptic soap that kills germs and continues to kill germs even after washing.   DO NOT use if you have an allergy to chlorhexidine/CHG or antibacterial soaps. If your skin becomes reddened or irritated, stop using the CHG and notify one of our RNs at [phone].              TAKE A SHOWER THE NIGHT BEFORE SURGERY AND THE DAY OF SURGERY    Please keep in mind the following:  DO NOT shave, including legs and underarms, 48 hours prior to surgery.   You may shave your face before/day  of surgery.  Place clean sheets on your bed the night before surgery Use a clean washcloth (not used since being washed) for each shower. DO NOT sleep with pet's night before surgery.  CHG Shower Instructions:  Wash your face and private area with normal soap. If you choose to wash your hair, wash first with your normal shampoo.  After you use shampoo/soap, rinse your hair and body thoroughly to remove shampoo/soap residue.  Turn the water OFF and apply half the bottle of CHG soap to a CLEAN washcloth.  Apply CHG soap ONLY FROM YOUR NECK DOWN TO YOUR TOES (washing for 3-5 minutes)  DO NOT use CHG soap on face, private areas, open wounds,  or sores.  Pay special attention to the area where your surgery is being performed.  If you are having back surgery, having someone wash your back for you may be helpful. Wait 2 minutes after CHG soap is applied, then you may rinse off the CHG soap.  Pat dry with a clean towel  Put on clean pajamas    Additional instructions for the day of surgery: DO NOT APPLY any lotions, deodorants, cologne, or perfumes.   Do not wear jewelry or makeup Do not wear nail polish, gel polish, artificial nails, or any other type of covering on natural nails (fingers and toes) Do not bring valuables to the hospital. [facility] is not responsible for valuables/personal belongings. Put on clean/comfortable clothes.  Please brush your teeth.  Ask your nurse before applying any prescription medications to the skin.

## 2023-04-25 LAB — SARS CORONAVIRUS 2 (TAT 6-24 HRS): SARS Coronavirus 2: NEGATIVE

## 2023-04-25 MED ORDER — TRANEXAMIC ACID (OHS) PUMP PRIME SOLUTION
2.0000 mg/kg | INTRAVENOUS | Status: DC
Start: 2023-04-26 — End: 2023-04-26
  Filled 2023-04-25: qty 1.86

## 2023-04-25 MED ORDER — CEFAZOLIN SODIUM-DEXTROSE 2-4 GM/100ML-% IV SOLN
2.0000 g | INTRAVENOUS | Status: AC
  Administered 2023-04-26 (×2): 2 g via INTRAVENOUS
  Filled 2023-04-25: qty 100

## 2023-04-25 MED ORDER — TRANEXAMIC ACID 1000 MG/10ML IV SOLN
1.5000 mg/kg/h | INTRAVENOUS | Status: AC
  Administered 2023-04-26: 1.5 mg/kg/h via INTRAVENOUS
  Filled 2023-04-25 (×2): qty 25

## 2023-04-25 MED ORDER — INSULIN REGULAR(HUMAN) IN NACL 100-0.9 UT/100ML-% IV SOLN
INTRAVENOUS | Status: AC
Start: 2023-04-26 — End: 2023-04-27
  Administered 2023-04-26: 1 [IU]/h via INTRAVENOUS
  Filled 2023-04-25: qty 100

## 2023-04-25 MED ORDER — VANCOMYCIN HCL 1000 MG IV SOLR
INTRAVENOUS | Status: DC
  Filled 2023-04-25: qty 20

## 2023-04-25 MED ORDER — TRANEXAMIC ACID (OHS) BOLUS VIA INFUSION
15.0000 mg/kg | INTRAVENOUS | Status: AC
Start: 2023-04-26 — End: 2023-04-27
  Administered 2023-04-26: 1392 mg via INTRAVENOUS
  Filled 2023-04-25: qty 1392

## 2023-04-25 MED ORDER — NOREPINEPHRINE 4 MG/250ML-% IV SOLN
0.0000 ug/min | INTRAVENOUS | Status: AC
  Administered 2023-04-26: 2 ug/min via INTRAVENOUS
  Filled 2023-04-25: qty 250

## 2023-04-25 MED ORDER — VANCOMYCIN HCL 1500 MG/300ML IV SOLN
1500.0000 mg | INTRAVENOUS | Status: AC
Start: 2023-04-26 — End: 2023-04-27
  Administered 2023-04-26: 1500 mg via INTRAVENOUS
  Filled 2023-04-25: qty 300

## 2023-04-25 MED ORDER — MILRINONE LACTATE IN DEXTROSE 20-5 MG/100ML-% IV SOLN
0.3000 ug/kg/min | INTRAVENOUS | Status: DC
  Filled 2023-04-25: qty 100

## 2023-04-25 MED ORDER — POTASSIUM CHLORIDE 2 MEQ/ML IV SOLN
80.0000 meq | INTRAVENOUS | Status: DC
Start: 2023-04-26 — End: 2023-04-27
  Filled 2023-04-25: qty 40

## 2023-04-25 MED ORDER — MANNITOL 20 % IV SOLN
INTRAVENOUS | Status: DC
  Filled 2023-04-25: qty 13

## 2023-04-25 MED ORDER — NITROGLYCERIN IN D5W 200-5 MCG/ML-% IV SOLN
2.0000 ug/min | INTRAVENOUS | Status: DC
  Filled 2023-04-25: qty 250

## 2023-04-25 MED ORDER — EPINEPHRINE HCL 5 MG/250ML IV SOLN IN NS
0.0000 ug/min | INTRAVENOUS | Status: AC
  Administered 2023-04-26: 2 ug/min via INTRAVENOUS
  Filled 2023-04-25: qty 250

## 2023-04-25 MED ORDER — CEFAZOLIN SODIUM-DEXTROSE 2-4 GM/100ML-% IV SOLN
2.0000 g | INTRAVENOUS | Status: DC
Start: 2023-04-26 — End: 2023-04-26
  Filled 2023-04-25: qty 100

## 2023-04-25 MED ORDER — PLASMA-LYTE A IV SOLN
INTRAVENOUS | Status: DC
  Filled 2023-04-25: qty 2.5

## 2023-04-25 MED ORDER — HEPARIN 30,000 UNITS/1000 ML (OHS) CELLSAVER SOLUTION
Status: DC
Start: 2023-04-26 — End: 2023-04-27
  Filled 2023-04-25: qty 1000

## 2023-04-25 MED ORDER — DEXMEDETOMIDINE HCL IN NACL 400 MCG/100ML IV SOLN
0.1000 ug/kg/h | INTRAVENOUS | Status: AC
  Administered 2023-04-26: .7 ug/kg/h via INTRAVENOUS
  Filled 2023-04-25: qty 100

## 2023-04-25 MED ORDER — PHENYLEPHRINE HCL-NACL 20-0.9 MG/250ML-% IV SOLN
30.0000 ug/min | INTRAVENOUS | Status: AC
  Administered 2023-04-26: 20 ug/min via INTRAVENOUS
  Filled 2023-04-25: qty 250

## 2023-04-25 NOTE — H&P (Signed)
[address]       [place] [address]             [phone]                                   Mayland Trexler [facility] Medical Record #[id number] Date of Birth: [birth year]   Gardiner Sleeper, MD   Chief Complaint:Bicuspid aortic stenosis and ascending aortic aneurysm      History of Present Illness:     Pt is a pleasant 57 yo male who has been followed for progressive bicuspid aortic stenosis. Pt has had a recent echo with a mean gradient of and with normal LV function. Pt has not had any symptoms of exertional CP, SOB, lightheadedness or paplitations. He has positional discomfort when lying of left side but has frozen shoulder and feels hard to determine the source. He has also been followed for a moderate ascending aortic aneurysm which has been measured at 4.6 cm on a CTA from this January and on recent echo also estimated at 4.5cm.  He has had a cath without CAD or PHTN.His brother died suddenly from some heart related issue at age 53 but has no autopsy to confirm cause. Wife is concerned that he take care of this issue now.             Past Medical History:  Diagnosis Date   Aortic aneurysm (HCC)     GERD (gastroesophageal reflux disease)     Hyperlipidemia     Vertigo                 Past Surgical History:  Procedure Laterality Date   APPENDECTOMY       EYE SURGERY       RIGHT/LEFT HEART CATH AND CORONARY ANGIOGRAPHY N/A 12/28/2022    Procedure: RIGHT/LEFT HEART CATH AND CORONARY ANGIOGRAPHY;  Surgeon: [place], Peter M, MD;  Location: [facility] INVASIVE CV LAB;  Service: Cardiovascular;  Laterality: N/A;   STERIOD INJECTION   09/16/2011    Procedure: MINOR STEROID INJECTION;  Surgeon: Mat Carne, MD;  Location: [facility];  Service: Orthopedics;  Laterality: Left;  Left Lumbar Five-Sacral One Transforaminal Epidural Steroid Injection   TEE WITHOUT CARDIOVERSION N/A 11/05/2020    Procedure: TRANSESOPHAGEAL  ECHOCARDIOGRAM (TEE);  Surgeon: Christell Constant, MD;  Location: [facility] ENDOSCOPY;  Service: Cardiovascular;  Laterality: N/A;          Tobacco Use History  Social History       Tobacco Use  Smoking Status Former  Smokeless Tobacco Never      Social History       Substance and Sexual Activity  Alcohol Use No      Social History         Socioeconomic History   Marital status: Married      Spouse name: Not on file   Number of children: Not on file   Years of education: Not on file   Highest education level: Not on file  Occupational History   Not on file  Tobacco Use   Smoking status: Former   Smokeless tobacco: Never  Substance and Sexual Activity   Alcohol use: No   Drug use: No   Sexual activity: Not on file  Other Topics Concern   Not on file  Social History Narrative   Not on file  Social Determinants of Health        Financial Resource Strain: Medium Risk (08/12/2022)    Overall Financial Resource Strain (CARDIA)     Difficulty of Paying Living Expenses: Somewhat hard  Food Insecurity: No Food Insecurity (07/06/2022)    Hunger Vital Sign     Worried About Running Out of Food in the Last Year: Never true     Ran Out of Food in the Last Year: Never true  Transportation Needs: No Transportation Needs (07/06/2022)    PRAPARE - Therapist, art (Medical): No     Lack of Transportation (Non-Medical): No  Physical Activity: Not on file  Stress: Not on file  Social Connections: Not on file  Intimate Partner Violence: Not At Risk (07/06/2022)    Humiliation, Afraid, Rape, and Kick questionnaire     Fear of Current or Ex-Partner: No     Emotionally Abused: No     Physically Abused: No     Sexually Abused: No      Allergies       Allergies  Allergen Reactions   Levaquin [Levofloxacin]        Aortic Aneurysm              Current Outpatient Medications  Medication Sig Dispense Refill   amLODipine (NORVASC) 5 MG tablet  Take 1 tablet (5 mg total) by mouth daily. 90 tablet 3   calcium carbonate (TUMS) 500 MG chewable tablet as needed for indigestion or heartburn.       diclofenac Sodium (VOLTAREN) 1 % GEL Apply 1 application topically in the morning, at noon, and at bedtime.       omeprazole (PRILOSEC) 20 MG capsule Take 20 mg by mouth every morning.       ondansetron (ZOFRAN-ODT) 4 MG disintegrating tablet Take 1-2 tablets (4-8 mg total) by mouth every 8 (eight) hours as needed. 30 tablet 3   rizatriptan (MAXALT-MLT) 10 MG disintegrating tablet Take 1 tablet (10 mg total) by mouth as needed for migraine. May repeat in 2 hours if needed 9 tablet 11   rosuvastatin (CRESTOR) 10 MG tablet Take 1 tablet (10 mg total) by mouth daily. 90 tablet 3   topiramate (TOPAMAX) 50 MG tablet Take 1 tablet (50 mg total) by mouth at bedtime. 90 tablet 4      No current facility-administered medications for this visit.               Family History  Problem Relation Age of Onset   Hypertension Mother     Diabetes Father     Migraines Neg Hx                  Physical Exam: Lungs: clear Card: RR with 3/6 sem Ext: warm  Neuro: intact Teeth in good repair       Diagnostic Studies & Laboratory data: I have personally reviewed the following studies and agree with the findings   CATH (12/2022) Conclusion   Normal coronary anatomy Mildly elevated LV filling pressures. PCWP 21/20 with mean 19 mm Hg. LVEDP 23 mm Hg Mild pulmonary HTN. PAP 45/17 with mean 26 mm Hg Moderate Aortic stenosis by cath. Mean AV gradient 35 mm Hg with AVA 1.38 cm squared. Index 0.69. Cath data may underestimate severity of stenosis. Normal cardiac output 6.74 L/min, index 3.36   Plan: surgical consultation    TTE (12/2022) IMPRESSIONS     1. Left ventricular ejection fraction, by estimation, is  60 to 65%. The  left ventricle has normal function. The left ventricle has no regional  wall motion abnormalities. Left ventricular diastolic  parameters were  normal.   2. Right ventricular systolic function is normal. The right ventricular  size is normal.   3. Left atrial size was moderately dilated.   4. The mitral valve is abnormal. Trivial mitral valve regurgitation. No  evidence of mitral stenosis.   5. The aortic valve is bicuspid. There is severe calcifcation of the  aortic valve. There is severe thickening of the aortic valve. Aortic valve  regurgitation is mild. Severe aortic valve stenosis.   6. Aortic dilatation noted. There is moderate dilatation of the ascending  aorta, measuring 45 mm.   7. The inferior vena cava is normal in size with greater than 50%  respiratory variability, suggesting right atrial pressure of 3 mmHg.   FINDINGS   Left Ventricle: Left ventricular ejection fraction, by estimation, is 60  to 65%. The left ventricle has normal function. The left ventricle has no  regional wall motion abnormalities. The left ventricular internal cavity  size was normal in size. There is   no left ventricular hypertrophy. Left ventricular diastolic parameters  were normal.   Right Ventricle: The right ventricular size is normal. No increase in  right ventricular wall thickness. Right ventricular systolic function is  normal.   Left Atrium: Left atrial size was moderately dilated.   Right Atrium: Right atrial size was normal in size.   Pericardium: There is no evidence of pericardial effusion.   Mitral Valve: The mitral valve is abnormal. There is mild thickening of  the mitral valve leaflet(s). There is mild calcification of the mitral  valve leaflet(s). Trivial mitral valve regurgitation. No evidence of  mitral valve stenosis.   Tricuspid Valve: The tricuspid valve is normal in structure. Tricuspid  valve regurgitation is mild . No evidence of tricuspid stenosis.   Aortic Valve: The aortic valve is bicuspid. There is severe calcifcation  of the aortic valve. There is severe thickening of the aortic  valve.  Aortic valve regurgitation is mild. Severe aortic stenosis is present.  Aortic valve mean gradient measures 39.0  mmHg. Aortic valve peak gradient measures 65.3 mmHg. Aortic valve area, by  VTI measures 0.83 cm.   Pulmonic Valve: The pulmonic valve was normal in structure. Pulmonic valve  regurgitation is mild. No evidence of pulmonic stenosis.   Aorta: Aortic dilatation noted. There is moderate dilatation of the  ascending aorta, measuring 45 mm.   Venous: The inferior vena cava is normal in size with greater than 50%  respiratory variability, suggesting right atrial pressure of 3 mmHg.   IAS/Shunts: No atrial level shunt detected by color flow Doppler.     LEFT VENTRICLE  PLAX 2D  LVIDd:         4.40 cm   Diastology  LVIDs:         2.60 cm   LV e' medial:    9.46 cm/s  LV PW:         0.90 cm   LV E/e' medial:  9.4  LV IVS:        0.80 cm   LV e' lateral:   16.30 cm/s  LVOT diam:     2.00 cm   LV E/e' lateral: 5.5  LV SV:         83  LV SV Index:   41  LVOT Area:     3.14 cm  3D Volume EF:                           3D EF:        59 %                           LV EDV:       125 ml                           LV ESV:       51 ml                           LV SV:        73 ml   RIGHT VENTRICLE  RV Basal diam:  2.40 cm  RV Mid diam:    2.60 cm  RV S prime:     10.40 cm/s  TAPSE (M-mode): 1.6 cm  RVSP:           26.8 mmHg   LEFT ATRIUM             Index        RIGHT ATRIUM           Index  LA diam:        4.30 cm 2.15 cm/m   RA Pressure: 3.00 mmHg  LA Vol (A2C):   52.7 ml 26.31 ml/m  RA Area:     8.89 cm  LA Vol (A4C):   38.1 ml 19.02 ml/m  RA Volume:   13.90 ml  6.94 ml/m  LA Biplane Vol: 45.7 ml 22.81 ml/m   AORTIC VALVE  AV Area (Vmax):    0.76 cm  AV Area (Vmean):   0.75 cm  AV Area (VTI):     0.83 cm  AV Vmax:           404.00 cm/s  AV Vmean:          290.000 cm/s  AV VTI:            0.990 m  AV Peak Grad:      65.3  mmHg  AV Mean Grad:      39.0 mmHg  LVOT Vmax:         97.40 cm/s  LVOT Vmean:        69.300 cm/s  LVOT VTI:          0.263 m  LVOT/AV VTI ratio: 0.27    AORTA  Ao Root diam: 3.00 cm  Ao Asc diam:  4.50 cm   MITRAL VALVE               TRICUSPID VALVE  MV Area (PHT): 2.33 cm    TR Peak grad:   23.8 mmHg  MV Decel Time: 326 msec    TR Vmax:        244.00 cm/s  MV E velocity: 89.10 cm/s  Estimated RAP:  3.00 mmHg  MV A velocity: 79.70 cm/s  RVSP:           26.8 mmHg  MV E/A ratio:  1.12                             SHUNTS  Systemic VTI:  0.26 m                             Systemic Diam: 2.00 cm     Recent Radiology Findings:   CTA (06/2022) FINDINGS: Cardiovascular:   Aortic Root:   --Valve: 2.7 x 2.1 cm, previously 2.7 x 2.3 cm   --Sinuses: 2.6 x 2.4 x 2.3 cm, previously 2.8 x 2.7 x 2.6 cm   --Sinotubular Junction: 2.6 x 2.6 cm, previously 2.7 x 2.5 cm   Limitations by motion: Moderate   Thoracic Aorta:   --Ascending Aorta: 4.6  X 4.4 cm, previously 4.6 x 4.6 cm   --Aortic Arch: 3.3 x 3.3 cm, previously 3.2 x 3.0 cm   --Descending Aorta: 2.7 x 2.5 cm cm, previously 2.6 x 2.6 cm   Other:   Normal heart size. No pericardial effusion.   Mediastinum/Nodes: Imaged thyroid gland without nodules meeting criteria for imaging follow-up by size. Normal esophagus. No pathologically enlarged axillary, supraclavicular, mediastinal, or hilar lymph nodes.   Lungs/Pleura: The central airways are patent. No focal consolidation. No pneumothorax. No pleural effusion.   Upper abdomen: Normal.   Musculoskeletal: No acute or abnormal lytic or blastic osseous lesions.   Review of the MIP images confirms the above findings.   IMPRESSION: 1. Unchanged ascending thoracic aortic aneurysm measuring up to 4.6 cm. Ascending thoracic aortic aneurysm. Recommend semi-annual imaging followup by CTA or MRA and referral to cardiothoracic surgery if not already  obtained. This recommendation follows 2010 ACCF/AHA/AATS/ACR/ASA/SCA/SCAI/SIR/STS/SVM Guidelines for the Diagnosis and Management of Patients With Thoracic Aortic Disease. Circulation. 2010; 121: A213-Y865. Aortic aneurysm NOS (ICD10-I71.9) 2. No acute abnormality in the chest. No suspicious pulmonary nodules or masses.         Recent Lab Findings: Recent Labs       Lab Results  Component Value Date    WBC 6.8 12/26/2022    HGB 13.6 12/28/2022    HGB 13.6 12/28/2022    HCT 40.0 12/28/2022    HCT 40.0 12/28/2022    PLT 157 12/26/2022    GLUCOSE 101 (H) 12/26/2022    CHOL 133 12/20/2021    TRIG 116 12/20/2021    HDL 40 12/20/2021    LDLCALC 72 12/20/2021    ALT 44 12/20/2021    AST 29 09/13/2021    NA 142 12/28/2022    NA 142 12/28/2022    K 4.1 12/28/2022    K 4.0 12/28/2022    CL 102 12/26/2022    CREATININE 1.13 12/26/2022    BUN 21 12/26/2022    CO2 25 12/26/2022            Assessment / Plan:     57 yo male with now severe bicuspid aortic stenosis with normal LV function and without CAD or PHTN. Pt with NYHA class 0 symptoms. Pt feels concerned with waiting especially knowing that this will progress over time and he can wait till symptoms start, but overall feels to get out of the way now. We discussed all the risks and goals of surgery (including ascending aortic replacement) and options for mechanical vs tissue valve and he wishes to have a tissue valve replacement to avoid coumadin. We discussed recovery and they wish to have near the thanksgiving holiday for his wife to have off from work form school teaching to be home with him.

## 2023-04-25 NOTE — Anesthesia Preprocedure Evaluation (Signed)
Anesthesia Evaluation  Patient identified by MRN, date of birth, ID band Patient awake    Reviewed: Allergy & Precautions, H&P , NPO status , Patient's Chart, lab work & pertinent test results  Airway Mallampati: II  TM Distance: >3 FB Neck ROM: Full    Dental no notable dental hx.    Pulmonary former smoker   Pulmonary exam normal breath sounds clear to auscultation       Cardiovascular hypertension, + Peripheral Vascular Disease  Normal cardiovascular exam+ Valvular Problems/Murmurs AS  Rhythm:Regular Rate:Normal      ECHOCARDIOGRAM REPORT       [name] Date of Exam: 12/05/2022 Medical Rec #:  [id number]      Height:       66.0 in Accession #:    [id number]     Weight:       200.8 lb Date of Birth:  07/31/1965       BSA:          2.003 m Patient Age:    57 years       BP:           135/85 mmHg Patient Gender: M              HR:           59 bpm. Exam Location:  [address]  Procedure: 2D Echo, Cardiac Doppler, Color Doppler, 3D Echo and Strain Analysis  Indications:    Q23.1 Bicuspid aortic valve; I35.0 Nonrheumatic aortic (valve)                 stenosis   History:        Patient has prior history of Echocardiogram examinations, most                 recent 06/02/2022. Risk Factors:Hypertension and Former Smoker.                 Aneurysm of ascending aorta without rupture. Family history of                 sudden cardiac death.   Sonographer:    Cathie Beams RCS Referring Phys: [facility] CHANDRASEKHAR  IMPRESSIONS    1. Left ventricular ejection fraction, by estimation, is 60 to 65%. The left ventricle has normal function. The left ventricle has no regional wall motion abnormalities. Left ventricular diastolic parameters were normal.  2. Right ventricular systolic function is normal. The right ventricular size is normal.  3. Left atrial size was moderately dilated.  4. The mitral  valve is abnormal. Trivial mitral valve regurgitation. No evidence of mitral stenosis.  5. The aortic valve is bicuspid. There is severe calcifcation of the aortic valve. There is severe thickening of the aortic valve. Aortic valve regurgitation is mild. Severe aortic valve stenosis.  6. Aortic dilatation noted. There is moderate dilatation of the ascending aorta, measuring 45 mm.  7. The inferior vena cava is normal in size with greater than 50% respiratory variability, suggesting right atrial pressure of 3 mmHg.     Neuro/Psych  Headaches  negative psych ROS   GI/Hepatic Neg liver ROS,GERD  ,,  Endo/Other  negative endocrine ROS    Renal/GU negative Renal ROS  negative genitourinary   Musculoskeletal  (+) Arthritis ,    Abdominal   Peds negative pediatric ROS (+)  Hematology negative hematology ROS (+)   Anesthesia Other Findings   Reproductive/Obstetrics negative OB ROS  Anesthesia Physical Anesthesia Plan  ASA: 4  Anesthesia Plan: General   Post-op Pain Management:    Induction: Intravenous  PONV Risk Score and Plan: 3 and Treatment may vary due to age or medical condition, Ondansetron and Midazolam  Airway Management Planned: Oral ETT  Additional Equipment: Arterial line, CVP, PA Cath, TEE, 3D TEE and Ultrasound Guidance Line Placement  Intra-op Plan:   Post-operative Plan: Post-operative intubation/ventilation  Informed Consent: I have reviewed the patients History and Physical, chart, labs and discussed the procedure including the risks, benefits and alternatives for the proposed anesthesia with the patient or authorized representative who has indicated his/her understanding and acceptance.     Dental advisory given  Plan Discussed with: CRNA  Anesthesia Plan Comments:         Anesthesia Quick Evaluation

## 2023-04-26 ENCOUNTER — Other Ambulatory Visit: Payer: Self-pay

## 2023-04-26 ENCOUNTER — Encounter (HOSPITAL_COMMUNITY)
Admission: RE | Disposition: A | Discharge status: Hospice | Payer: Self-pay | Source: Home / Self Care | Attending: Thoracic Surgery (Cardiothoracic Vascular Surgery)

## 2023-04-26 ENCOUNTER — Inpatient Hospital Stay (HOSPITAL_COMMUNITY): Payer: Commercial Managed Care - HMO | Admitting: Certified Registered Nurse Anesthetist

## 2023-04-26 ENCOUNTER — Inpatient Hospital Stay (HOSPITAL_COMMUNITY): Payer: Commercial Managed Care - HMO

## 2023-04-26 ENCOUNTER — Inpatient Hospital Stay (HOSPITAL_COMMUNITY)
Admission: RE | Admit: 2023-04-26 | Discharge: 2023-05-02 | DRG: 003 | Disposition: A | Discharge status: Other Acute Inpatient Hospital | Payer: Commercial Managed Care - HMO | Attending: Thoracic Surgery (Cardiothoracic Vascular Surgery) | Admitting: Thoracic Surgery (Cardiothoracic Vascular Surgery)

## 2023-04-26 ENCOUNTER — Encounter (HOSPITAL_COMMUNITY): Payer: Self-pay | Admitting: Thoracic Surgery (Cardiothoracic Vascular Surgery)

## 2023-04-26 ENCOUNTER — Inpatient Hospital Stay (HOSPITAL_COMMUNITY)
Admission: RE | Disposition: A | Discharge status: Hospice | Payer: Self-pay | Source: Home / Self Care | Attending: Thoracic Surgery (Cardiothoracic Vascular Surgery)

## 2023-04-26 DIAGNOSIS — D62 Acute posthemorrhagic anemia: Secondary | ICD-10-CM | POA: Diagnosis not present

## 2023-04-26 DIAGNOSIS — R570 Cardiogenic shock: Secondary | ICD-10-CM | POA: Diagnosis not present

## 2023-04-26 DIAGNOSIS — Q2381 Bicuspid aortic valve: Principal | ICD-10-CM

## 2023-04-26 DIAGNOSIS — Z87891 Personal history of nicotine dependence: Secondary | ICD-10-CM

## 2023-04-26 DIAGNOSIS — E870 Hyperosmolality and hypernatremia: Secondary | ICD-10-CM | POA: Diagnosis present

## 2023-04-26 DIAGNOSIS — T4275XA Adverse effect of unspecified antiepileptic and sedative-hypnotic drugs, initial encounter: Secondary | ICD-10-CM | POA: Diagnosis not present

## 2023-04-26 DIAGNOSIS — Z952 Presence of prosthetic heart valve: Secondary | ICD-10-CM | POA: Diagnosis not present

## 2023-04-26 DIAGNOSIS — E785 Hyperlipidemia, unspecified: Secondary | ICD-10-CM | POA: Diagnosis present

## 2023-04-26 DIAGNOSIS — Z79899 Other long term (current) drug therapy: Secondary | ICD-10-CM

## 2023-04-26 DIAGNOSIS — R739 Hyperglycemia, unspecified: Secondary | ICD-10-CM | POA: Diagnosis not present

## 2023-04-26 DIAGNOSIS — I2489 Other forms of acute ischemic heart disease: Secondary | ICD-10-CM | POA: Diagnosis present

## 2023-04-26 DIAGNOSIS — I739 Peripheral vascular disease, unspecified: Secondary | ICD-10-CM | POA: Diagnosis present

## 2023-04-26 DIAGNOSIS — Z951 Presence of aortocoronary bypass graft: Secondary | ICD-10-CM

## 2023-04-26 DIAGNOSIS — I5082 Biventricular heart failure: Secondary | ICD-10-CM | POA: Diagnosis not present

## 2023-04-26 DIAGNOSIS — G928 Other toxic encephalopathy: Secondary | ICD-10-CM | POA: Diagnosis not present

## 2023-04-26 DIAGNOSIS — J9601 Acute respiratory failure with hypoxia: Secondary | ICD-10-CM | POA: Diagnosis not present

## 2023-04-26 DIAGNOSIS — Z85828 Personal history of other malignant neoplasm of skin: Secondary | ICD-10-CM

## 2023-04-26 DIAGNOSIS — K219 Gastro-esophageal reflux disease without esophagitis: Secondary | ICD-10-CM | POA: Diagnosis present

## 2023-04-26 DIAGNOSIS — E66813 Obesity, class 3: Secondary | ICD-10-CM | POA: Diagnosis present

## 2023-04-26 DIAGNOSIS — I110 Hypertensive heart disease with heart failure: Secondary | ICD-10-CM | POA: Diagnosis present

## 2023-04-26 DIAGNOSIS — I97618 Postprocedural hemorrhage and hematoma of a circulatory system organ or structure following other circulatory system procedure: Secondary | ICD-10-CM | POA: Diagnosis not present

## 2023-04-26 DIAGNOSIS — D65 Disseminated intravascular coagulation [defibrination syndrome]: Secondary | ICD-10-CM | POA: Diagnosis not present

## 2023-04-26 DIAGNOSIS — I472 Ventricular tachycardia, unspecified: Secondary | ICD-10-CM | POA: Diagnosis not present

## 2023-04-26 DIAGNOSIS — N179 Acute kidney failure, unspecified: Secondary | ICD-10-CM | POA: Diagnosis not present

## 2023-04-26 DIAGNOSIS — J9811 Atelectasis: Secondary | ICD-10-CM | POA: Diagnosis not present

## 2023-04-26 DIAGNOSIS — E872 Acidosis, unspecified: Secondary | ICD-10-CM | POA: Diagnosis not present

## 2023-04-26 DIAGNOSIS — I7121 Aneurysm of the ascending aorta, without rupture: Secondary | ICD-10-CM | POA: Diagnosis present

## 2023-04-26 DIAGNOSIS — Z833 Family history of diabetes mellitus: Secondary | ICD-10-CM

## 2023-04-26 DIAGNOSIS — E876 Hypokalemia: Secondary | ICD-10-CM | POA: Diagnosis not present

## 2023-04-26 DIAGNOSIS — R569 Unspecified convulsions: Secondary | ICD-10-CM | POA: Diagnosis not present

## 2023-04-26 DIAGNOSIS — I9581 Postprocedural hypotension: Secondary | ICD-10-CM | POA: Diagnosis not present

## 2023-04-26 DIAGNOSIS — D689 Coagulation defect, unspecified: Secondary | ICD-10-CM | POA: Diagnosis not present

## 2023-04-26 DIAGNOSIS — I4721 Torsades de pointes: Secondary | ICD-10-CM | POA: Diagnosis not present

## 2023-04-26 DIAGNOSIS — Z6835 Body mass index (BMI) 35.0-35.9, adult: Secondary | ICD-10-CM

## 2023-04-26 DIAGNOSIS — Z9049 Acquired absence of other specified parts of digestive tract: Secondary | ICD-10-CM

## 2023-04-26 DIAGNOSIS — J95821 Acute postprocedural respiratory failure: Secondary | ICD-10-CM | POA: Diagnosis not present

## 2023-04-26 DIAGNOSIS — I272 Pulmonary hypertension, unspecified: Secondary | ICD-10-CM | POA: Diagnosis present

## 2023-04-26 DIAGNOSIS — I97638 Postprocedural hematoma of a circulatory system organ or structure following other circulatory system procedure: Secondary | ICD-10-CM | POA: Diagnosis not present

## 2023-04-26 DIAGNOSIS — K7200 Acute and subacute hepatic failure without coma: Secondary | ICD-10-CM | POA: Diagnosis not present

## 2023-04-26 DIAGNOSIS — M7502 Adhesive capsulitis of left shoulder: Secondary | ICD-10-CM | POA: Diagnosis present

## 2023-04-26 DIAGNOSIS — I2510 Atherosclerotic heart disease of native coronary artery without angina pectoris: Secondary | ICD-10-CM | POA: Diagnosis not present

## 2023-04-26 DIAGNOSIS — Z8249 Family history of ischemic heart disease and other diseases of the circulatory system: Secondary | ICD-10-CM

## 2023-04-26 DIAGNOSIS — Z1152 Encounter for screening for COVID-19: Secondary | ICD-10-CM

## 2023-04-26 DIAGNOSIS — I97120 Postprocedural cardiac arrest following cardiac surgery: Secondary | ICD-10-CM | POA: Diagnosis not present

## 2023-04-26 DIAGNOSIS — I10 Essential (primary) hypertension: Secondary | ICD-10-CM | POA: Diagnosis not present

## 2023-04-26 DIAGNOSIS — R066 Hiccough: Secondary | ICD-10-CM | POA: Diagnosis not present

## 2023-04-26 DIAGNOSIS — I350 Nonrheumatic aortic (valve) stenosis: Secondary | ICD-10-CM | POA: Diagnosis present

## 2023-04-26 HISTORY — PX: CORONARY ARTERY BYPASS GRAFT: SHX141

## 2023-04-26 HISTORY — PX: CANNULATION FOR ECMO (EXTRACORPOREAL MEMBRANE OXYGENATION): SHX6796

## 2023-04-26 HISTORY — PX: TEE WITHOUT CARDIOVERSION: SHX5443

## 2023-04-26 HISTORY — PX: AORTIC VALVE REPLACEMENT: SHX41

## 2023-04-26 HISTORY — PX: REPLACEMENT ASCENDING AORTA: SHX6068

## 2023-04-26 HISTORY — PX: EXPLORATION POST OPERATIVE OPEN HEART: SHX5061

## 2023-04-26 LAB — POCT I-STAT 7, (LYTES, BLD GAS, ICA,H+H)
Acid-Base Excess: 1 mmol/L (ref 0.0–2.0)
Acid-base deficit: 2 mmol/L (ref 0.0–2.0)
Acid-base deficit: 5 mmol/L — ABNORMAL HIGH (ref 0.0–2.0)
Acid-base deficit: 8 mmol/L — ABNORMAL HIGH (ref 0.0–2.0)
Acid-base deficit: 15 mmol/L — ABNORMAL HIGH (ref 0.0–2.0)
Acid-base deficit: 21 mmol/L — ABNORMAL HIGH (ref 0.0–2.0)
Acid-base deficit: 2 mmol/L (ref 0.0–2.0)
Acid-base deficit: 10 mmol/L — ABNORMAL HIGH (ref 0.0–2.0)
Acid-base deficit: 4 mmol/L — ABNORMAL HIGH (ref 0.0–2.0)
Acid-base deficit: 4 mmol/L — ABNORMAL HIGH (ref 0.0–2.0)
Acid-base deficit: 1 mmol/L (ref 0.0–2.0)
Acid-base deficit: 1 mmol/L (ref 0.0–2.0)
Bicarbonate: 25.4 mmol/L (ref 20.0–28.0)
Bicarbonate: 22.7 mmol/L (ref 20.0–28.0)
Bicarbonate: 21.2 mmol/L (ref 20.0–28.0)
Bicarbonate: 19.6 mmol/L — ABNORMAL LOW (ref 20.0–28.0)
Bicarbonate: 10.9 mmol/L — ABNORMAL LOW (ref 20.0–28.0)
Bicarbonate: 13.7 mmol/L — ABNORMAL LOW (ref 20.0–28.0)
Bicarbonate: 23.3 mmol/L (ref 20.0–28.0)
Bicarbonate: 16.9 mmol/L — ABNORMAL LOW (ref 20.0–28.0)
Bicarbonate: 21.3 mmol/L (ref 20.0–28.0)
Bicarbonate: 21.9 mmol/L (ref 20.0–28.0)
Bicarbonate: 23.7 mmol/L (ref 20.0–28.0)
Bicarbonate: 26.4 mmol/L (ref 20.0–28.0)
Calcium, Ion: 1.03 mmol/L — ABNORMAL LOW (ref 1.15–1.40)
Calcium, Ion: 1.39 mmol/L (ref 1.15–1.40)
Calcium, Ion: 1.24 mmol/L (ref 1.15–1.40)
Calcium, Ion: 1.23 mmol/L (ref 1.15–1.40)
Calcium, Ion: 0.73 mmol/L — CL (ref 1.15–1.40)
Calcium, Ion: 0.9 mmol/L — ABNORMAL LOW (ref 1.15–1.40)
Calcium, Ion: 0.96 mmol/L — ABNORMAL LOW (ref 1.15–1.40)
Calcium, Ion: 1.03 mmol/L — ABNORMAL LOW (ref 1.15–1.40)
Calcium, Ion: 1.04 mmol/L — ABNORMAL LOW (ref 1.15–1.40)
Calcium, Ion: 1.11 mmol/L — ABNORMAL LOW (ref 1.15–1.40)
Calcium, Ion: 1.13 mmol/L — ABNORMAL LOW (ref 1.15–1.40)
Calcium, Ion: 1.19 mmol/L (ref 1.15–1.40)
HCT: 28 % — ABNORMAL LOW (ref 39.0–52.0)
HCT: 25 % — ABNORMAL LOW (ref 39.0–52.0)
HCT: 21 % — ABNORMAL LOW (ref 39.0–52.0)
HCT: 23 % — ABNORMAL LOW (ref 39.0–52.0)
HCT: 21 % — ABNORMAL LOW (ref 39.0–52.0)
HCT: 28 % — ABNORMAL LOW (ref 39.0–52.0)
HCT: 25 % — ABNORMAL LOW (ref 39.0–52.0)
HCT: 22 % — ABNORMAL LOW (ref 39.0–52.0)
HCT: 25 % — ABNORMAL LOW (ref 39.0–52.0)
HCT: 38 % — ABNORMAL LOW (ref 39.0–52.0)
HCT: 37 % — ABNORMAL LOW (ref 39.0–52.0)
HCT: 32 % — ABNORMAL LOW (ref 39.0–52.0)
Hemoglobin: 9.5 g/dL — ABNORMAL LOW (ref 13.0–17.0)
Hemoglobin: 8.5 g/dL — ABNORMAL LOW (ref 13.0–17.0)
Hemoglobin: 7.1 g/dL — ABNORMAL LOW (ref 13.0–17.0)
Hemoglobin: 7.8 g/dL — ABNORMAL LOW (ref 13.0–17.0)
Hemoglobin: 7.1 g/dL — ABNORMAL LOW (ref 13.0–17.0)
Hemoglobin: 9.5 g/dL — ABNORMAL LOW (ref 13.0–17.0)
Hemoglobin: 8.5 g/dL — ABNORMAL LOW (ref 13.0–17.0)
Hemoglobin: 7.5 g/dL — ABNORMAL LOW (ref 13.0–17.0)
Hemoglobin: 8.5 g/dL — ABNORMAL LOW (ref 13.0–17.0)
Hemoglobin: 12.9 g/dL — ABNORMAL LOW (ref 13.0–17.0)
Hemoglobin: 12.6 g/dL — ABNORMAL LOW (ref 13.0–17.0)
Hemoglobin: 10.9 g/dL — ABNORMAL LOW (ref 13.0–17.0)
O2 Saturation: 100 %
O2 Saturation: 100 %
O2 Saturation: 100 %
O2 Saturation: 95 %
O2 Saturation: 100 %
O2 Saturation: 61 %
O2 Saturation: 100 %
O2 Saturation: 100 %
O2 Saturation: 100 %
O2 Saturation: 100 %
O2 Saturation: 100 %
O2 Saturation: 100 %
Patient temperature: 36
Patient temperature: 34.6
Patient temperature: 36.3
Patient temperature: 36.1
Potassium: 4.4 mmol/L (ref 3.5–5.1)
Potassium: 3.4 mmol/L — ABNORMAL LOW (ref 3.5–5.1)
Potassium: 3.6 mmol/L (ref 3.5–5.1)
Potassium: 3.4 mmol/L — ABNORMAL LOW (ref 3.5–5.1)
Potassium: 4.3 mmol/L (ref 3.5–5.1)
Potassium: 4.6 mmol/L (ref 3.5–5.1)
Potassium: 3.7 mmol/L (ref 3.5–5.1)
Potassium: 3.9 mmol/L (ref 3.5–5.1)
Potassium: 3.9 mmol/L (ref 3.5–5.1)
Potassium: 3.9 mmol/L (ref 3.5–5.1)
Potassium: 4.1 mmol/L (ref 3.5–5.1)
Potassium: 3.8 mmol/L (ref 3.5–5.1)
Sodium: 138 mmol/L (ref 135–145)
Sodium: 138 mmol/L (ref 135–145)
Sodium: 141 mmol/L (ref 135–145)
Sodium: 144 mmol/L (ref 135–145)
Sodium: 144 mmol/L (ref 135–145)
Sodium: 145 mmol/L (ref 135–145)
Sodium: 150 mmol/L — ABNORMAL HIGH (ref 135–145)
Sodium: 149 mmol/L — ABNORMAL HIGH (ref 135–145)
Sodium: 150 mmol/L — ABNORMAL HIGH (ref 135–145)
Sodium: 148 mmol/L — ABNORMAL HIGH (ref 135–145)
Sodium: 150 mmol/L — ABNORMAL HIGH (ref 135–145)
Sodium: 152 mmol/L — ABNORMAL HIGH (ref 135–145)
TCO2: 26 mmol/L (ref 22–32)
TCO2: 24 mmol/L (ref 22–32)
TCO2: 23 mmol/L (ref 22–32)
TCO2: 21 mmol/L — ABNORMAL LOW (ref 22–32)
TCO2: 12 mmol/L — ABNORMAL LOW (ref 22–32)
TCO2: 15 mmol/L — ABNORMAL LOW (ref 22–32)
TCO2: 25 mmol/L (ref 22–32)
TCO2: 18 mmol/L — ABNORMAL LOW (ref 22–32)
TCO2: 22 mmol/L (ref 22–32)
TCO2: 23 mmol/L (ref 22–32)
TCO2: 25 mmol/L (ref 22–32)
TCO2: 28 mmol/L (ref 22–32)
pCO2 arterial: 38.1 mm[Hg] (ref 32–48)
pCO2 arterial: 38.7 mm[Hg] (ref 32–48)
pCO2 arterial: 43.9 mm[Hg] (ref 32–48)
pCO2 arterial: 46.7 mm[Hg] (ref 32–48)
pCO2 arterial: 45.1 mm[Hg] (ref 32–48)
pCO2 arterial: 52 mm[Hg] — ABNORMAL HIGH (ref 32–48)
pCO2 arterial: 40.2 mm[Hg] (ref 32–48)
pCO2 arterial: 38.1 mm[Hg] (ref 32–48)
pCO2 arterial: 40.3 mm[Hg] (ref 32–48)
pCO2 arterial: 36.3 mm[Hg] (ref 32–48)
pCO2 arterial: 36 mm[Hg] (ref 32–48)
pCO2 arterial: 52.1 mm[Hg] — ABNORMAL HIGH (ref 32–48)
pH, Arterial: 7.431 (ref 7.35–7.45)
pH, Arterial: 7.376 (ref 7.35–7.45)
pH, Arterial: 7.292 — ABNORMAL LOW (ref 7.35–7.45)
pH, Arterial: 7.225 — ABNORMAL LOW (ref 7.35–7.45)
pH, Arterial: 6.93 — CL (ref 7.35–7.45)
pH, Arterial: 7.092 — CL (ref 7.35–7.45)
pH, Arterial: 7.372 (ref 7.35–7.45)
pH, Arterial: 7.23 — ABNORMAL LOW (ref 7.35–7.45)
pH, Arterial: 7.354 (ref 7.35–7.45)
pH, Arterial: 7.377 (ref 7.35–7.45)
pH, Arterial: 7.425 (ref 7.35–7.45)
pH, Arterial: 7.309 — ABNORMAL LOW (ref 7.35–7.45)
pO2, Arterial: 367 mm[Hg] — ABNORMAL HIGH (ref 83–108)
pO2, Arterial: 349 mm[Hg] — ABNORMAL HIGH (ref 83–108)
pO2, Arterial: 213 mm[Hg] — ABNORMAL HIGH (ref 83–108)
pO2, Arterial: 83 mm[Hg] (ref 83–108)
pO2, Arterial: 276 mm[Hg] — ABNORMAL HIGH (ref 83–108)
pO2, Arterial: 51 mm[Hg] — ABNORMAL LOW (ref 83–108)
pO2, Arterial: 295 mm[Hg] — ABNORMAL HIGH (ref 83–108)
pO2, Arterial: 449 mm[Hg] — ABNORMAL HIGH (ref 83–108)
pO2, Arterial: 505 mm[Hg] — ABNORMAL HIGH (ref 83–108)
pO2, Arterial: 528 mm[Hg] — ABNORMAL HIGH (ref 83–108)
pO2, Arterial: 457 mm[Hg] — ABNORMAL HIGH (ref 83–108)
pO2, Arterial: 381 mm[Hg] — ABNORMAL HIGH (ref 83–108)

## 2023-04-26 LAB — POCT I-STAT, CHEM 8
BUN: 16 mg/dL (ref 6–20)
BUN: 14 mg/dL (ref 6–20)
BUN: 16 mg/dL (ref 6–20)
BUN: 16 mg/dL (ref 6–20)
BUN: 16 mg/dL (ref 6–20)
BUN: 16 mg/dL (ref 6–20)
BUN: 14 mg/dL (ref 6–20)
BUN: 14 mg/dL (ref 6–20)
BUN: 11 mg/dL (ref 6–20)
BUN: 12 mg/dL (ref 6–20)
BUN: 10 mg/dL (ref 6–20)
BUN: 11 mg/dL (ref 6–20)
Calcium, Ion: 1.32 mmol/L (ref 1.15–1.40)
Calcium, Ion: 1.25 mmol/L (ref 1.15–1.40)
Calcium, Ion: 1.15 mmol/L (ref 1.15–1.40)
Calcium, Ion: 1.1 mmol/L — ABNORMAL LOW (ref 1.15–1.40)
Calcium, Ion: 1.45 mmol/L — ABNORMAL HIGH (ref 1.15–1.40)
Calcium, Ion: 1.39 mmol/L (ref 1.15–1.40)
Calcium, Ion: 1.12 mmol/L — ABNORMAL LOW (ref 1.15–1.40)
Calcium, Ion: 1.23 mmol/L (ref 1.15–1.40)
Calcium, Ion: 0.73 mmol/L — CL (ref 1.15–1.40)
Calcium, Ion: 0.94 mmol/L — ABNORMAL LOW (ref 1.15–1.40)
Calcium, Ion: 1.03 mmol/L — ABNORMAL LOW (ref 1.15–1.40)
Calcium, Ion: 1.04 mmol/L — ABNORMAL LOW (ref 1.15–1.40)
Chloride: 104 mmol/L (ref 98–111)
Chloride: 106 mmol/L (ref 98–111)
Chloride: 102 mmol/L (ref 98–111)
Chloride: 102 mmol/L (ref 98–111)
Chloride: 104 mmol/L (ref 98–111)
Chloride: 105 mmol/L (ref 98–111)
Chloride: 105 mmol/L (ref 98–111)
Chloride: 107 mmol/L (ref 98–111)
Chloride: 107 mmol/L (ref 98–111)
Chloride: 102 mmol/L (ref 98–111)
Chloride: 104 mmol/L (ref 98–111)
Chloride: 105 mmol/L (ref 98–111)
Creatinine, Ser: 0.9 mg/dL (ref 0.61–1.24)
Creatinine, Ser: 0.8 mg/dL (ref 0.61–1.24)
Creatinine, Ser: 0.8 mg/dL (ref 0.61–1.24)
Creatinine, Ser: 0.8 mg/dL (ref 0.61–1.24)
Creatinine, Ser: 0.9 mg/dL (ref 0.61–1.24)
Creatinine, Ser: 0.9 mg/dL (ref 0.61–1.24)
Creatinine, Ser: 1.1 mg/dL (ref 0.61–1.24)
Creatinine, Ser: 1.2 mg/dL (ref 0.61–1.24)
Creatinine, Ser: 1.1 mg/dL (ref 0.61–1.24)
Creatinine, Ser: 1.1 mg/dL (ref 0.61–1.24)
Creatinine, Ser: 1 mg/dL (ref 0.61–1.24)
Creatinine, Ser: 1 mg/dL (ref 0.61–1.24)
Glucose, Bld: 105 mg/dL — ABNORMAL HIGH (ref 70–99)
Glucose, Bld: 114 mg/dL — ABNORMAL HIGH (ref 70–99)
Glucose, Bld: 105 mg/dL — ABNORMAL HIGH (ref 70–99)
Glucose, Bld: 115 mg/dL — ABNORMAL HIGH (ref 70–99)
Glucose, Bld: 151 mg/dL — ABNORMAL HIGH (ref 70–99)
Glucose, Bld: 155 mg/dL — ABNORMAL HIGH (ref 70–99)
Glucose, Bld: 154 mg/dL — ABNORMAL HIGH (ref 70–99)
Glucose, Bld: 162 mg/dL — ABNORMAL HIGH (ref 70–99)
Glucose, Bld: 330 mg/dL — ABNORMAL HIGH (ref 70–99)
Glucose, Bld: 242 mg/dL — ABNORMAL HIGH (ref 70–99)
Glucose, Bld: 182 mg/dL — ABNORMAL HIGH (ref 70–99)
Glucose, Bld: 209 mg/dL — ABNORMAL HIGH (ref 70–99)
HCT: 38 % — ABNORMAL LOW (ref 39.0–52.0)
HCT: 32 % — ABNORMAL LOW (ref 39.0–52.0)
HCT: 28 % — ABNORMAL LOW (ref 39.0–52.0)
HCT: 26 % — ABNORMAL LOW (ref 39.0–52.0)
HCT: 25 % — ABNORMAL LOW (ref 39.0–52.0)
HCT: 24 % — ABNORMAL LOW (ref 39.0–52.0)
HCT: 20 % — ABNORMAL LOW (ref 39.0–52.0)
HCT: 23 % — ABNORMAL LOW (ref 39.0–52.0)
HCT: 28 % — ABNORMAL LOW (ref 39.0–52.0)
HCT: 24 % — ABNORMAL LOW (ref 39.0–52.0)
HCT: 23 % — ABNORMAL LOW (ref 39.0–52.0)
HCT: 25 % — ABNORMAL LOW (ref 39.0–52.0)
Hemoglobin: 12.9 g/dL — ABNORMAL LOW (ref 13.0–17.0)
Hemoglobin: 10.9 g/dL — ABNORMAL LOW (ref 13.0–17.0)
Hemoglobin: 9.5 g/dL — ABNORMAL LOW (ref 13.0–17.0)
Hemoglobin: 8.8 g/dL — ABNORMAL LOW (ref 13.0–17.0)
Hemoglobin: 8.5 g/dL — ABNORMAL LOW (ref 13.0–17.0)
Hemoglobin: 8.2 g/dL — ABNORMAL LOW (ref 13.0–17.0)
Hemoglobin: 6.8 g/dL — CL (ref 13.0–17.0)
Hemoglobin: 7.8 g/dL — ABNORMAL LOW (ref 13.0–17.0)
Hemoglobin: 9.5 g/dL — ABNORMAL LOW (ref 13.0–17.0)
Hemoglobin: 8.2 g/dL — ABNORMAL LOW (ref 13.0–17.0)
Hemoglobin: 7.8 g/dL — ABNORMAL LOW (ref 13.0–17.0)
Hemoglobin: 8.5 g/dL — ABNORMAL LOW (ref 13.0–17.0)
Potassium: 3.9 mmol/L (ref 3.5–5.1)
Potassium: 3.7 mmol/L (ref 3.5–5.1)
Potassium: 5.2 mmol/L — ABNORMAL HIGH (ref 3.5–5.1)
Potassium: 5.5 mmol/L — ABNORMAL HIGH (ref 3.5–5.1)
Potassium: 3.4 mmol/L — ABNORMAL LOW (ref 3.5–5.1)
Potassium: 3.4 mmol/L — ABNORMAL LOW (ref 3.5–5.1)
Potassium: 3.1 mmol/L — ABNORMAL LOW (ref 3.5–5.1)
Potassium: 3.6 mmol/L (ref 3.5–5.1)
Potassium: 4.3 mmol/L (ref 3.5–5.1)
Potassium: 3.7 mmol/L (ref 3.5–5.1)
Potassium: 3.9 mmol/L (ref 3.5–5.1)
Potassium: 3.9 mmol/L (ref 3.5–5.1)
Sodium: 142 mmol/L (ref 135–145)
Sodium: 140 mmol/L (ref 135–145)
Sodium: 137 mmol/L (ref 135–145)
Sodium: 136 mmol/L (ref 135–145)
Sodium: 139 mmol/L (ref 135–145)
Sodium: 139 mmol/L (ref 135–145)
Sodium: 141 mmol/L (ref 135–145)
Sodium: 143 mmol/L (ref 135–145)
Sodium: 145 mmol/L (ref 135–145)
Sodium: 149 mmol/L — ABNORMAL HIGH (ref 135–145)
Sodium: 149 mmol/L — ABNORMAL HIGH (ref 135–145)
Sodium: 150 mmol/L — ABNORMAL HIGH (ref 135–145)
TCO2: 28 mmol/L (ref 22–32)
TCO2: 22 mmol/L (ref 22–32)
TCO2: 28 mmol/L (ref 22–32)
TCO2: 26 mmol/L (ref 22–32)
TCO2: 25 mmol/L (ref 22–32)
TCO2: 24 mmol/L (ref 22–32)
TCO2: 21 mmol/L — ABNORMAL LOW (ref 22–32)
TCO2: 22 mmol/L (ref 22–32)
TCO2: 13 mmol/L — ABNORMAL LOW (ref 22–32)
TCO2: 22 mmol/L (ref 22–32)
TCO2: 21 mmol/L — ABNORMAL LOW (ref 22–32)
TCO2: 23 mmol/L (ref 22–32)

## 2023-04-26 LAB — LACTATE DEHYDROGENASE: LDH: 593 U/L — ABNORMAL HIGH (ref 98–192)

## 2023-04-26 LAB — POCT I-STAT EG7
Acid-Base Excess: 2 mmol/L (ref 0.0–2.0)
Acid-base deficit: 5 mmol/L — ABNORMAL HIGH (ref 0.0–2.0)
Bicarbonate: 26.3 mmol/L (ref 20.0–28.0)
Bicarbonate: 22.3 mmol/L (ref 20.0–28.0)
Calcium, Ion: 1.08 mmol/L — ABNORMAL LOW (ref 1.15–1.40)
Calcium, Ion: 0.93 mmol/L — ABNORMAL LOW (ref 1.15–1.40)
HCT: 27 % — ABNORMAL LOW (ref 39.0–52.0)
HCT: 20 % — ABNORMAL LOW (ref 39.0–52.0)
Hemoglobin: 9.2 g/dL — ABNORMAL LOW (ref 13.0–17.0)
Hemoglobin: 6.8 g/dL — CL (ref 13.0–17.0)
O2 Saturation: 78 %
O2 Saturation: 46 %
Potassium: 4.2 mmol/L (ref 3.5–5.1)
Potassium: 3.9 mmol/L (ref 3.5–5.1)
Sodium: 138 mmol/L (ref 135–145)
Sodium: 148 mmol/L — ABNORMAL HIGH (ref 135–145)
TCO2: 27 mmol/L (ref 22–32)
TCO2: 24 mmol/L (ref 22–32)
pCO2, Ven: 40.6 mm[Hg] — ABNORMAL LOW (ref 44–60)
pCO2, Ven: 56.5 mm[Hg] (ref 44–60)
pH, Ven: 7.419 (ref 7.25–7.43)
pH, Ven: 7.205 — ABNORMAL LOW (ref 7.25–7.43)
pO2, Ven: 41 mm[Hg] (ref 32–45)
pO2, Ven: 31 mm[Hg] — CL (ref 32–45)

## 2023-04-26 LAB — DIC (DISSEMINATED INTRAVASCULAR COAGULATION)PANEL
D-Dimer, Quant: <0.27 ug{FEU}/mL (ref 0.00–0.50)
D-Dimer, Quant: 0.46 ug{FEU}/mL (ref 0.00–0.50)
Fibrinogen: 92 mg/dL — CL (ref 210–475)
Fibrinogen: 105 mg/dL — ABNORMAL LOW (ref 210–475)
INR: 2.8 — ABNORMAL HIGH (ref 0.8–1.2)
INR: 2.6 — ABNORMAL HIGH (ref 0.8–1.2)
Platelets: 58 10*3/uL — ABNORMAL LOW (ref 150–400)
Platelets: 35 10*3/uL — ABNORMAL LOW (ref 150–400)
Prothrombin Time: 29.8 s — ABNORMAL HIGH (ref 11.4–15.2)
Prothrombin Time: 28.4 s — ABNORMAL HIGH (ref 11.4–15.2)
Smear Review: NONE SEEN
Smear Review: NONE SEEN
aPTT: >200 s (ref 24–36)
aPTT: 72 s — ABNORMAL HIGH (ref 24–36)

## 2023-04-26 LAB — BASIC METABOLIC PANEL
Anion gap: 15 (ref 5–15)
BUN: 11 mg/dL (ref 6–20)
CO2: 21 mmol/L — ABNORMAL LOW (ref 22–32)
Calcium: 7.9 mg/dL — ABNORMAL LOW (ref 8.9–10.3)
Chloride: 112 mmol/L — ABNORMAL HIGH (ref 98–111)
Creatinine, Ser: 1.51 mg/dL — ABNORMAL HIGH (ref 0.61–1.24)
GFR, Estimated: 53 mL/min — ABNORMAL LOW (ref >60)
Glucose, Bld: 149 mg/dL — ABNORMAL HIGH (ref 70–99)
Potassium: 3.9 mmol/L (ref 3.5–5.1)
Sodium: 148 mmol/L — ABNORMAL HIGH (ref 135–145)

## 2023-04-26 LAB — CBC
HCT: 23 % — ABNORMAL LOW (ref 39.0–52.0)
HCT: 19.9 % — ABNORMAL LOW (ref 39.0–52.0)
HCT: 40.3 % (ref 39.0–52.0)
Hemoglobin: 7.7 g/dL — ABNORMAL LOW (ref 13.0–17.0)
Hemoglobin: 6.6 g/dL — CL (ref 13.0–17.0)
Hemoglobin: 14.2 g/dL (ref 13.0–17.0)
MCH: 29.5 pg (ref 26.0–34.0)
MCH: 28.6 pg (ref 26.0–34.0)
MCH: 29.2 pg (ref 26.0–34.0)
MCHC: 33.5 g/dL (ref 30.0–36.0)
MCHC: 33.2 g/dL (ref 30.0–36.0)
MCHC: 35.2 g/dL (ref 30.0–36.0)
MCV: 88.1 fL (ref 80.0–100.0)
MCV: 86.1 fL (ref 80.0–100.0)
MCV: 82.8 fL (ref 80.0–100.0)
Platelets: 100 10*3/uL — ABNORMAL LOW (ref 150–400)
Platelets: 58 10*3/uL — ABNORMAL LOW (ref 150–400)
Platelets: 31 10*3/uL — ABNORMAL LOW (ref 150–400)
RBC: 2.61 MIL/uL — ABNORMAL LOW (ref 4.22–5.81)
RBC: 2.31 MIL/uL — ABNORMAL LOW (ref 4.22–5.81)
RBC: 4.87 MIL/uL (ref 4.22–5.81)
RDW: 13.7 % (ref 11.5–15.5)
RDW: 15.6 % — ABNORMAL HIGH (ref 11.5–15.5)
RDW: 15.1 % (ref 11.5–15.5)
WBC: 19.1 10*3/uL — ABNORMAL HIGH (ref 4.0–10.5)
WBC: 7.6 10*3/uL (ref 4.0–10.5)
WBC: 12 10*3/uL — ABNORMAL HIGH (ref 4.0–10.5)
nRBC: 0 % (ref 0.0–0.2)
nRBC: 0.3 % — ABNORMAL HIGH (ref 0.0–0.2)
nRBC: 0 % (ref 0.0–0.2)

## 2023-04-26 LAB — HEMOGLOBIN AND HEMATOCRIT, BLOOD
HCT: 27.2 % — ABNORMAL LOW (ref 39.0–52.0)
Hemoglobin: 9.5 g/dL — ABNORMAL LOW (ref 13.0–17.0)

## 2023-04-26 LAB — APTT: aPTT: 51 s — ABNORMAL HIGH (ref 24–36)

## 2023-04-26 LAB — HEPATIC FUNCTION PANEL
ALT: 67 U/L — ABNORMAL HIGH (ref 0–44)
AST: 370 U/L — ABNORMAL HIGH (ref 15–41)
Albumin: 2.3 g/dL — ABNORMAL LOW (ref 3.5–5.0)
Alkaline Phosphatase: 16 U/L — ABNORMAL LOW (ref 38–126)
Bilirubin, Direct: 0.9 mg/dL — ABNORMAL HIGH (ref 0.0–0.2)
Indirect Bilirubin: 1.1 mg/dL — ABNORMAL HIGH (ref 0.3–0.9)
Total Bilirubin: 2 mg/dL — ABNORMAL HIGH (ref <1.2)
Total Protein: 3.1 g/dL — ABNORMAL LOW (ref 6.5–8.1)

## 2023-04-26 LAB — HEPARIN LEVEL (UNFRACTIONATED): Heparin Unfractionated: <0.1 [IU]/mL — ABNORMAL LOW (ref 0.30–0.70)

## 2023-04-26 LAB — CG4 I-STAT (LACTIC ACID)
Lactic Acid, Venous: 5 mmol/L (ref 0.5–1.9)
Lactic Acid, Venous: 9.7 mmol/L (ref 0.5–1.9)

## 2023-04-26 LAB — GLUCOSE, CAPILLARY
Glucose-Capillary: 153 mg/dL — ABNORMAL HIGH (ref 70–99)
Glucose-Capillary: 193 mg/dL — ABNORMAL HIGH (ref 70–99)
Glucose-Capillary: 120 mg/dL — ABNORMAL HIGH (ref 70–99)
Glucose-Capillary: 102 mg/dL — ABNORMAL HIGH (ref 70–99)
Glucose-Capillary: 112 mg/dL — ABNORMAL HIGH (ref 70–99)

## 2023-04-26 LAB — PLATELET COUNT: Platelets: 100 10*3/uL — ABNORMAL LOW (ref 150–400)

## 2023-04-26 LAB — MASSIVE TRANSFUSION PROTOCOL ORDER (BLOOD BANK NOTIFICATION)

## 2023-04-26 LAB — PROTIME-INR
INR: 2.1 — ABNORMAL HIGH (ref 0.8–1.2)
Prothrombin Time: 23.8 s — ABNORMAL HIGH (ref 11.4–15.2)

## 2023-04-26 LAB — FIBRINOGEN: Fibrinogen: 151 mg/dL — ABNORMAL LOW (ref 210–475)

## 2023-04-26 LAB — PREPARE RBC (CROSSMATCH)

## 2023-04-26 LAB — MAGNESIUM: Magnesium: 2 mg/dL (ref 1.7–2.4)

## 2023-04-26 LAB — ABO/RH: ABO/RH(D): A POS

## 2023-04-26 SURGERY — EXPLORATION POST OPERATIVE OPEN HEART
Anesthesia: General | Site: Chest

## 2023-04-26 SURGERY — REPLACEMENT, AORTIC VALVE, OPEN
Anesthesia: General | Site: Chest

## 2023-04-26 MED ORDER — PHENYLEPHRINE 80 MCG/ML (10ML) SYRINGE FOR IV PUSH (FOR BLOOD PRESSURE SUPPORT)
PREFILLED_SYRINGE | INTRAVENOUS | Status: DC | PRN
  Administered 2023-04-26: 240 ug via INTRAVENOUS

## 2023-04-26 MED ORDER — NOREPINEPHRINE 4 MG/250ML-% IV SOLN
0.0000 ug/min | INTRAVENOUS | Status: DC
Start: 2023-04-26 — End: 2023-04-26
  Administered 2023-04-26: 10 ug/min via INTRAVENOUS

## 2023-04-26 MED ORDER — SODIUM CHLORIDE 0.45 % IV SOLN
INTRAVENOUS | Status: DC | PRN
Start: 2023-04-26 — End: 2023-04-27

## 2023-04-26 MED ORDER — METOPROLOL TARTRATE 12.5 MG HALF TABLET: 12.5000 mg | ORAL_TABLET | Freq: Once | ORAL | Status: DC

## 2023-04-26 MED ORDER — FENTANYL CITRATE (PF) 250 MCG/5ML IJ SOLN
INTRAMUSCULAR | Status: AC
  Filled 2023-04-26: qty 5

## 2023-04-26 MED ORDER — METOPROLOL TARTRATE 5 MG/5ML IV SOLN: 2.5000 mg | INTRAVENOUS | Status: DC | PRN

## 2023-04-26 MED ORDER — DEXMEDETOMIDINE HCL IN NACL 400 MCG/100ML IV SOLN: 0.0000 ug/kg/h | INTRAVENOUS | Status: DC

## 2023-04-26 MED ORDER — ALBUMIN HUMAN 5 % IV SOLN
12.5000 g | INTRAVENOUS | Status: DC | PRN
  Administered 2023-04-27 – 2023-05-01 (×8): 12.5 g via INTRAVENOUS
  Filled 2023-04-26 (×5): qty 250

## 2023-04-26 MED ORDER — ONDANSETRON HCL 4 MG/2ML IJ SOLN
INTRAMUSCULAR | Status: DC | PRN
  Administered 2023-04-26: 4 mg via INTRAVENOUS

## 2023-04-26 MED ORDER — POTASSIUM CHLORIDE 2 MEQ/ML IV SOLN
80.0000 meq | INTRAVENOUS | Status: DC
  Filled 2023-04-26: qty 40

## 2023-04-26 MED ORDER — HEMOSTATIC AGENTS (NO CHARGE) OPTIME
TOPICAL | Status: DC | PRN
  Administered 2023-04-26: 1 via TOPICAL

## 2023-04-26 MED ORDER — VECURONIUM BROMIDE 10 MG IV SOLR
INTRAVENOUS | Status: AC
  Filled 2023-04-26: qty 10

## 2023-04-26 MED ORDER — BISACODYL 10 MG RE SUPP: 10.0000 mg | Freq: Every day | RECTAL | Status: DC

## 2023-04-26 MED ORDER — FENTANYL CITRATE (PF) 250 MCG/5ML IJ SOLN
INTRAMUSCULAR | Status: DC | PRN
  Administered 2023-04-26 (×3): 100 ug via INTRAVENOUS
  Administered 2023-04-26: 150 ug via INTRAVENOUS
  Administered 2023-04-26: 200 ug via INTRAVENOUS
  Administered 2023-04-26: 50 ug via INTRAVENOUS
  Administered 2023-04-26 (×2): 150 ug via INTRAVENOUS
  Administered 2023-04-26: 200 ug via INTRAVENOUS
  Administered 2023-04-26: 50 ug via INTRAVENOUS

## 2023-04-26 MED ORDER — MAGNESIUM SULFATE 50 % IJ SOLN
40.0000 meq | INTRAMUSCULAR | Status: DC
  Filled 2023-04-26: qty 9.85

## 2023-04-26 MED ORDER — DEXTROSE 50 % IV SOLN: 0.0000 mL | INTRAVENOUS | Status: DC | PRN

## 2023-04-26 MED ORDER — PROPOFOL 10 MG/ML IV BOLUS
INTRAVENOUS | Status: AC
  Filled 2023-04-26: qty 20

## 2023-04-26 MED ORDER — INSULIN ASPART 100 UNIT/ML IJ SOLN
0.0000 [IU] | INTRAMUSCULAR | Status: DC
  Administered 2023-04-27: 4 [IU] via SUBCUTANEOUS
  Administered 2023-04-27 – 2023-04-29 (×9): 2 [IU] via SUBCUTANEOUS
  Administered 2023-04-29: 4 [IU] via SUBCUTANEOUS
  Administered 2023-04-29 – 2023-05-01 (×13): 2 [IU] via SUBCUTANEOUS
  Administered 2023-05-01: 4 [IU] via SUBCUTANEOUS

## 2023-04-26 MED ORDER — HEPARIN SODIUM (PORCINE) 1000 UNIT/ML IJ SOLN
INTRAMUSCULAR | Status: AC
  Filled 2023-04-26: qty 1

## 2023-04-26 MED ORDER — ROCURONIUM BROMIDE 10 MG/ML (PF) SYRINGE
PREFILLED_SYRINGE | INTRAVENOUS | Status: DC | PRN
  Administered 2023-04-26: 50 mg via INTRAVENOUS
  Administered 2023-04-26: 100 mg via INTRAVENOUS
  Administered 2023-04-26 (×2): 50 mg via INTRAVENOUS

## 2023-04-26 MED ORDER — HEPARIN 30,000 UNITS/1000 ML (OHS) CELLSAVER SOLUTION
Status: DC
  Filled 2023-04-26: qty 1000

## 2023-04-26 MED ORDER — HYDROMORPHONE BOLUS VIA INFUSION
1.0000 mg | INTRAVENOUS | Status: DC | PRN
  Administered 2023-04-27: 0.5 mg via INTRAVENOUS
  Administered 2023-04-27 – 2023-04-29 (×7): 1 mg via INTRAVENOUS
  Administered 2023-04-30: 2 mg via INTRAVENOUS
  Administered 2023-04-30 (×3): 1 mg via INTRAVENOUS

## 2023-04-26 MED ORDER — CHLORHEXIDINE GLUCONATE 0.12 % MT SOLN
15.0000 mL | OROMUCOSAL | Status: AC
  Administered 2023-04-26: 15 mL via OROMUCOSAL
  Filled 2023-04-26: qty 15

## 2023-04-26 MED ORDER — ALBUMIN HUMAN 5 % IV SOLN: INTRAVENOUS | Status: DC | PRN

## 2023-04-26 MED ORDER — CHLORHEXIDINE GLUCONATE 0.12 % MT SOLN
15.0000 mL | Freq: Once | OROMUCOSAL | Status: AC
Start: 2023-04-26 — End: 2023-04-26
  Administered 2023-04-26: 15 mL via OROMUCOSAL
  Filled 2023-04-26: qty 15

## 2023-04-26 MED ORDER — ORAL CARE MOUTH RINSE: 15.0000 mL | Freq: Once | OROMUCOSAL | Status: AC

## 2023-04-26 MED ORDER — MIDAZOLAM HCL 2 MG/2ML IJ SOLN
2.0000 mg | INTRAMUSCULAR | Status: DC | PRN
  Administered 2023-04-26: 4 mg via INTRAVENOUS
  Filled 2023-04-26: qty 2

## 2023-04-26 MED ORDER — VANCOMYCIN HCL 1000 MG IV SOLR
INTRAVENOUS | Status: DC
  Filled 2023-04-26: qty 20

## 2023-04-26 MED ORDER — SODIUM BICARBONATE 8.4 % IV SOLN
50.0000 meq | Freq: Once | INTRAVENOUS | Status: AC
  Administered 2023-04-26: 50 meq via INTRAVENOUS

## 2023-04-26 MED ORDER — NITROGLYCERIN IN D5W 200-5 MCG/ML-% IV SOLN
2.0000 ug/min | INTRAVENOUS | Status: DC
  Filled 2023-04-26: qty 250

## 2023-04-26 MED ORDER — PLASMA-LYTE A IV SOLN
INTRAVENOUS | Status: DC
  Filled 2023-04-26: qty 5

## 2023-04-26 MED ORDER — DEXMEDETOMIDINE HCL IN NACL 400 MCG/100ML IV SOLN
0.1000 ug/kg/h | INTRAVENOUS | Status: DC
  Filled 2023-04-26: qty 100

## 2023-04-26 MED ORDER — TRANEXAMIC ACID (OHS) PUMP PRIME SOLUTION
2.0000 mg/kg | INTRAVENOUS | Status: DC
  Filled 2023-04-26: qty 1.81

## 2023-04-26 MED ORDER — ALBUMIN HUMAN 5 % IV SOLN
250.0000 mL | INTRAVENOUS | Status: AC | PRN
  Administered 2023-04-26 (×5): 12.5 g via INTRAVENOUS
  Filled 2023-04-26 (×3): qty 250

## 2023-04-26 MED ORDER — POTASSIUM CHLORIDE 10 MEQ/50ML IV SOLN
10.0000 meq | INTRAVENOUS | Status: AC
  Administered 2023-04-26 (×2): 10 meq via INTRAVENOUS

## 2023-04-26 MED ORDER — PHENYLEPHRINE HCL (PRESSORS) 10 MG/ML IV SOLN
INTRAVENOUS | Status: AC
Start: 2023-04-26 — End: ?
  Filled 2023-04-26: qty 1

## 2023-04-26 MED ORDER — PROPOFOL 10 MG/ML IV BOLUS
INTRAVENOUS | Status: AC
Start: 2023-04-26 — End: ?
  Filled 2023-04-26: qty 20

## 2023-04-26 MED ORDER — VANCOMYCIN HCL 1000 MG IV SOLR
INTRAVENOUS | Status: AC
Start: 2023-04-26 — End: ?
  Filled 2023-04-26: qty 20

## 2023-04-26 MED ORDER — PROPOFOL 10 MG/ML IV BOLUS
INTRAVENOUS | Status: DC | PRN
  Administered 2023-04-26: 50 mg via INTRAVENOUS

## 2023-04-26 MED ORDER — MAGNESIUM SULFATE 4 GM/100ML IV SOLN
4.0000 g | Freq: Once | INTRAVENOUS | Status: AC
  Administered 2023-04-26: 4 g via INTRAVENOUS
  Filled 2023-04-26: qty 100

## 2023-04-26 MED ORDER — LACTATED RINGERS IV SOLN: INTRAVENOUS | Status: AC

## 2023-04-26 MED ORDER — MILRINONE LACTATE IN DEXTROSE 20-5 MG/100ML-% IV SOLN
0.3000 ug/kg/min | INTRAVENOUS | Status: DC
  Filled 2023-04-26: qty 100

## 2023-04-26 MED ORDER — LACTATED RINGERS IV SOLN: INTRAVENOUS | Status: DC | PRN

## 2023-04-26 MED ORDER — CHLORHEXIDINE GLUCONATE 0.12 % MT SOLN
15.0000 mL | Freq: Once | OROMUCOSAL | Status: DC
Start: 2023-04-27 — End: 2023-04-26

## 2023-04-26 MED ORDER — SODIUM CHLORIDE 0.9 % IV SOLN
2.0000 g | INTRAVENOUS | Status: DC
  Administered 2023-04-26: 2 g via INTRAVENOUS
  Filled 2023-04-26: qty 20

## 2023-04-26 MED ORDER — MIDAZOLAM-SODIUM CHLORIDE 100-0.9 MG/100ML-% IV SOLN
4.0000 mg/h | INTRAVENOUS | Status: DC
  Administered 2023-04-27: 4 mg/h via INTRAVENOUS
  Administered 2023-04-28 – 2023-04-29 (×4): 8 mg/h via INTRAVENOUS
  Administered 2023-04-30 (×3): 10 mg/h via INTRAVENOUS
  Administered 2023-05-01 (×2): 9 mg/h via INTRAVENOUS
  Filled 2023-04-26 (×10): qty 100

## 2023-04-26 MED ORDER — PHENYLEPHRINE 80 MCG/ML (10ML) SYRINGE FOR IV PUSH (FOR BLOOD PRESSURE SUPPORT)
PREFILLED_SYRINGE | INTRAVENOUS | Status: AC
  Filled 2023-04-26: qty 20

## 2023-04-26 MED ORDER — CALCIUM GLUCONATE-NACL 1-0.675 GM/50ML-% IV SOLN: 1.0000 g | Freq: Once | INTRAVENOUS | Status: DC

## 2023-04-26 MED ORDER — ALBUMIN HUMAN 5 % IV SOLN
INTRAVENOUS | Status: AC
  Administered 2023-04-26: 12.5 g
  Filled 2023-04-26: qty 500

## 2023-04-26 MED ORDER — HYDROMORPHONE BOLUS VIA INFUSION: 0.2500 mg | INTRAVENOUS | Status: DC | PRN

## 2023-04-26 MED ORDER — EPINEPHRINE 1 MG/10ML IJ SOSY
PREFILLED_SYRINGE | INTRAMUSCULAR | Status: DC | PRN
  Administered 2023-04-26: 40 ug via INTRAVENOUS
  Administered 2023-04-26: 60 ug via INTRAVENOUS

## 2023-04-26 MED ORDER — MIDAZOLAM BOLUS VIA INFUSION: 0.0000 mg | INTRAVENOUS | Status: DC | PRN

## 2023-04-26 MED ORDER — SODIUM CHLORIDE 0.9% IV SOLUTION: Freq: Once | INTRAVENOUS | Status: DC

## 2023-04-26 MED ORDER — VANCOMYCIN HCL 1750 MG/350ML IV SOLN
1750.0000 mg | Freq: Once | INTRAVENOUS | Status: AC
  Administered 2023-04-26: 1750 mg via INTRAVENOUS
  Filled 2023-04-26: qty 350

## 2023-04-26 MED ORDER — HYDROMORPHONE HCL-NACL 50-0.9 MG/50ML-% IV SOLN
2.0000 mg/h | INTRAVENOUS | Status: DC
  Administered 2023-04-26 – 2023-04-27 (×2): 2 mg/h via INTRAVENOUS
  Administered 2023-04-28: 5 mg/h via INTRAVENOUS
  Administered 2023-04-28 – 2023-04-29 (×6): 6 mg/h via INTRAVENOUS
  Administered 2023-04-30: 8 mg/h via INTRAVENOUS
  Administered 2023-04-30: 7.5 mg/h via INTRAVENOUS
  Filled 2023-04-26 (×12): qty 50

## 2023-04-26 MED ORDER — VASOPRESSIN 20 UNITS/100 ML INFUSION FOR SHOCK
0.0000 [IU]/min | INTRAVENOUS | Status: DC
  Administered 2023-04-26: .03 [IU]/min via INTRAVENOUS

## 2023-04-26 MED ORDER — SODIUM CHLORIDE 0.9% IV SOLUTION: Freq: Once | INTRAVENOUS | Status: AC

## 2023-04-26 MED ORDER — VANCOMYCIN HCL IN DEXTROSE 1-5 GM/200ML-% IV SOLN
1000.0000 mg | Freq: Once | INTRAVENOUS | Status: DC
Start: 2023-04-26 — End: 2023-04-26

## 2023-04-26 MED ORDER — NOREPINEPHRINE 16 MG/250ML-% IV SOLN
0.0000 ug/min | INTRAVENOUS | Status: DC
  Administered 2023-04-27: 2 ug/min via INTRAVENOUS
  Administered 2023-04-28: 10 ug/min via INTRAVENOUS
  Administered 2023-04-30: 4 ug/min via INTRAVENOUS
  Administered 2023-04-30: 7 ug/min via INTRAVENOUS
  Filled 2023-04-26 (×5): qty 250

## 2023-04-26 MED ORDER — NOREPINEPHRINE 4 MG/250ML-% IV SOLN
0.0000 ug/min | INTRAVENOUS | Status: DC
  Filled 2023-04-26: qty 250

## 2023-04-26 MED ORDER — ROCURONIUM BROMIDE 10 MG/ML (PF) SYRINGE
PREFILLED_SYRINGE | INTRAVENOUS | Status: AC
  Filled 2023-04-26: qty 10

## 2023-04-26 MED ORDER — SODIUM CHLORIDE 0.9% FLUSH
3.0000 mL | INTRAVENOUS | Status: DC | PRN
  Administered 2023-04-27: 3 mL via INTRAVENOUS

## 2023-04-26 MED ORDER — PHENYLEPHRINE 80 MCG/ML (10ML) SYRINGE FOR IV PUSH (FOR BLOOD PRESSURE SUPPORT)
PREFILLED_SYRINGE | INTRAVENOUS | Status: DC | PRN
  Administered 2023-04-26: 80 ug via INTRAVENOUS
  Administered 2023-04-26: 160 ug via INTRAVENOUS
  Administered 2023-04-26: 80 ug via INTRAVENOUS
  Administered 2023-04-26 (×3): 160 ug via INTRAVENOUS
  Administered 2023-04-26 (×3): 40 ug via INTRAVENOUS
  Administered 2023-04-26: 240 ug via INTRAVENOUS
  Administered 2023-04-26: 80 ug via INTRAVENOUS
  Administered 2023-04-26: 160 ug via INTRAVENOUS
  Administered 2023-04-26: 40 ug via INTRAVENOUS
  Administered 2023-04-26: 160 ug via INTRAVENOUS
  Administered 2023-04-26: 80 ug via INTRAVENOUS
  Administered 2023-04-26: 240 ug via INTRAVENOUS
  Administered 2023-04-26: 160 ug via INTRAVENOUS
  Administered 2023-04-26: 80 ug via INTRAVENOUS
  Administered 2023-04-26: 160 ug via INTRAVENOUS
  Administered 2023-04-26: 80 ug via INTRAVENOUS

## 2023-04-26 MED ORDER — TRANEXAMIC ACID (OHS) BOLUS VIA INFUSION
15.0000 mg/kg | INTRAVENOUS | Status: DC
  Filled 2023-04-26: qty 1361

## 2023-04-26 MED ORDER — PANTOPRAZOLE SODIUM 40 MG IV SOLR
40.0000 mg | Freq: Every day | INTRAVENOUS | Status: DC
Start: 2023-04-26 — End: 2023-04-27
  Administered 2023-04-26: 40 mg via INTRAVENOUS
  Filled 2023-04-26: qty 10

## 2023-04-26 MED ORDER — HEPARIN SODIUM (PORCINE) 1000 UNIT/ML IJ SOLN
INTRAMUSCULAR | Status: DC | PRN
  Administered 2023-04-26: 31000 [IU] via INTRAVENOUS

## 2023-04-26 MED ORDER — INSULIN REGULAR(HUMAN) IN NACL 100-0.9 UT/100ML-% IV SOLN
INTRAVENOUS | Status: DC
  Filled 2023-04-26: qty 100

## 2023-04-26 MED ORDER — METOPROLOL TARTRATE 25 MG/10 ML ORAL SUSPENSION: 12.5000 mg | Freq: Two times a day (BID) | ORAL | Status: DC

## 2023-04-26 MED ORDER — CEFAZOLIN SODIUM-DEXTROSE 2-4 GM/100ML-% IV SOLN
2.0000 g | INTRAVENOUS | Status: DC
  Filled 2023-04-26 (×2): qty 100

## 2023-04-26 MED ORDER — PANTOPRAZOLE SODIUM 40 MG IV SOLR: 40.0000 mg | Freq: Every day | INTRAVENOUS | Status: DC

## 2023-04-26 MED ORDER — MORPHINE SULFATE (PF) 2 MG/ML IV SOLN: 1.0000 mg | INTRAVENOUS | Status: DC | PRN

## 2023-04-26 MED ORDER — PROTAMINE SULFATE 10 MG/ML IV SOLN
INTRAVENOUS | Status: DC | PRN
  Administered 2023-04-26: 50 mg via INTRAVENOUS
  Administered 2023-04-26: 100 mg via INTRAVENOUS
  Administered 2023-04-26: 300 mg via INTRAVENOUS

## 2023-04-26 MED ORDER — VANCOMYCIN HCL 1500 MG/300ML IV SOLN
1500.0000 mg | INTRAVENOUS | Status: DC
  Filled 2023-04-26: qty 300

## 2023-04-26 MED ORDER — PROTAMINE SULFATE 10 MG/ML IV SOLN
INTRAVENOUS | Status: AC
  Filled 2023-04-26: qty 20

## 2023-04-26 MED ORDER — MIDAZOLAM HCL (PF) 10 MG/2ML IJ SOLN
INTRAMUSCULAR | Status: AC
  Filled 2023-04-26: qty 2

## 2023-04-26 MED ORDER — ASPIRIN 325 MG PO TBEC: 325.0000 mg | DELAYED_RELEASE_TABLET | Freq: Every day | ORAL | Status: DC

## 2023-04-26 MED ORDER — ACETAMINOPHEN 160 MG/5ML PO SOLN
650.0000 mg | Freq: Once | ORAL | Status: AC
  Administered 2023-04-26: 650 mg
  Filled 2023-04-26: qty 20.3

## 2023-04-26 MED ORDER — PROPOFOL 1000 MG/100ML IV EMUL
INTRAVENOUS | Status: AC
  Filled 2023-04-26: qty 100

## 2023-04-26 MED ORDER — ACETAMINOPHEN 500 MG PO TABS: 1000.0000 mg | ORAL_TABLET | Freq: Four times a day (QID) | ORAL | Status: DC

## 2023-04-26 MED ORDER — LACTATED RINGERS IV SOLN: INTRAVENOUS | Status: DC

## 2023-04-26 MED ORDER — TRANEXAMIC ACID 1000 MG/10ML IV SOLN
1.5000 mg/kg/h | INTRAVENOUS | Status: DC
  Filled 2023-04-26: qty 25

## 2023-04-26 MED ORDER — CHLORHEXIDINE GLUCONATE 4 % EX SOLN: 30.0000 mL | CUTANEOUS | Status: DC

## 2023-04-26 MED ORDER — TRAMADOL HCL 50 MG PO TABS: 50.0000 mg | ORAL_TABLET | ORAL | Status: DC | PRN

## 2023-04-26 MED ORDER — SODIUM CHLORIDE 0.9 % IV SOLN
INTRAVENOUS | Status: DC
Start: 2023-04-26 — End: 2023-04-27

## 2023-04-26 MED ORDER — PROTAMINE SULFATE 10 MG/ML IV SOLN
INTRAVENOUS | Status: AC
  Filled 2023-04-26: qty 25

## 2023-04-26 MED ORDER — PHENYLEPHRINE HCL-NACL 20-0.9 MG/250ML-% IV SOLN
30.0000 ug/min | INTRAVENOUS | Status: DC
  Filled 2023-04-26: qty 250

## 2023-04-26 MED ORDER — VASOPRESSIN 20 UNITS/100 ML INFUSION FOR SHOCK
INTRAVENOUS | Status: AC
  Filled 2023-04-26: qty 100

## 2023-04-26 MED ORDER — ROCURONIUM BROMIDE 10 MG/ML (PF) SYRINGE
PREFILLED_SYRINGE | INTRAVENOUS | Status: DC | PRN
  Administered 2023-04-26: 50 mg via INTRAVENOUS
  Administered 2023-04-26: 100 mg via INTRAVENOUS
  Administered 2023-04-26: 50 mg via INTRAVENOUS

## 2023-04-26 MED ORDER — MIDAZOLAM HCL 2 MG/2ML IJ SOLN
INTRAMUSCULAR | Status: AC
  Filled 2023-04-26: qty 2

## 2023-04-26 MED ORDER — EPINEPHRINE 1 MG/10ML IJ SOSY
PREFILLED_SYRINGE | INTRAMUSCULAR | Status: DC | PRN
  Administered 2023-04-26: 4 ug via INTRAVENOUS
  Administered 2023-04-26 (×2): 10 ug via INTRAVENOUS
  Administered 2023-04-26: 4 ug via INTRAVENOUS
  Administered 2023-04-26: 8 ug via INTRAVENOUS

## 2023-04-26 MED ORDER — OXYCODONE HCL 5 MG PO TABS
5.0000 mg | ORAL_TABLET | ORAL | Status: DC | PRN
  Administered 2023-04-30 – 2023-05-01 (×2): 10 mg via ORAL
  Filled 2023-04-26 (×2): qty 2

## 2023-04-26 MED ORDER — BISACODYL 5 MG PO TBEC: 10.0000 mg | DELAYED_RELEASE_TABLET | Freq: Every day | ORAL | Status: DC

## 2023-04-26 MED ORDER — PLASMA-LYTE A IV SOLN
INTRAVENOUS | Status: DC
  Filled 2023-04-26: qty 2.5

## 2023-04-26 MED ORDER — PROPOFOL 500 MG/50ML IV EMUL
INTRAVENOUS | Status: DC | PRN
  Administered 2023-04-26: 30 ug/kg/min via INTRAVENOUS

## 2023-04-26 MED ORDER — CEFAZOLIN SODIUM-DEXTROSE 2-4 GM/100ML-% IV SOLN
2.0000 g | Freq: Three times a day (TID) | INTRAVENOUS | Status: DC
  Administered 2023-04-26: 2 g via INTRAVENOUS
  Filled 2023-04-26: qty 100

## 2023-04-26 MED ORDER — SODIUM CHLORIDE (PF) 0.9 % IJ SOLN
INTRAMUSCULAR | Status: AC
Start: 2023-04-26 — End: ?
  Filled 2023-04-26: qty 10

## 2023-04-26 MED ORDER — ASPIRIN 81 MG PO CHEW
324.0000 mg | CHEWABLE_TABLET | Freq: Once | ORAL | Status: AC
  Administered 2023-04-26: 324 mg via ORAL
  Filled 2023-04-26: qty 4

## 2023-04-26 MED ORDER — DOCUSATE SODIUM 100 MG PO CAPS: 200.0000 mg | ORAL_CAPSULE | Freq: Every day | ORAL | Status: DC

## 2023-04-26 MED ORDER — PROTAMINE SULFATE 10 MG/ML IV SOLN
INTRAVENOUS | Status: DC | PRN
  Administered 2023-04-26: 300 mg via INTRAVENOUS

## 2023-04-26 MED ORDER — HEPARIN SODIUM (PORCINE) 1000 UNIT/ML IJ SOLN
INTRAMUSCULAR | Status: AC
  Filled 2023-04-26: qty 30

## 2023-04-26 MED ORDER — VANCOMYCIN HCL 1000 MG IV SOLR: INTRAVENOUS | Status: DC | PRN

## 2023-04-26 MED ORDER — ONDANSETRON HCL 4 MG/2ML IJ SOLN: 4.0000 mg | Freq: Four times a day (QID) | INTRAMUSCULAR | Status: DC | PRN

## 2023-04-26 MED ORDER — SODIUM CHLORIDE 0.9 % IV SOLN: INTRAVENOUS | Status: DC | PRN

## 2023-04-26 MED ORDER — DOCUSATE SODIUM 50 MG/5ML PO LIQD
100.0000 mg | Freq: Two times a day (BID) | ORAL | Status: DC
  Administered 2023-04-28 – 2023-04-30 (×4): 100 mg
  Filled 2023-04-26 (×6): qty 10

## 2023-04-26 MED ORDER — MIDAZOLAM-SODIUM CHLORIDE 100-0.9 MG/100ML-% IV SOLN
INTRAVENOUS | Status: AC
  Administered 2023-04-26: 4 mg/h via INTRAVENOUS
  Filled 2023-04-26: qty 100

## 2023-04-26 MED ORDER — ~~LOC~~ CARDIAC SURGERY, PATIENT & FAMILY EDUCATION
Freq: Once | Status: DC
Start: 2023-04-26 — End: 2023-04-26
  Filled 2023-04-26: qty 1

## 2023-04-26 MED ORDER — EPINEPHRINE HCL 5 MG/250ML IV SOLN IN NS
0.0000 ug/min | INTRAVENOUS | Status: DC
  Filled 2023-04-26: qty 250

## 2023-04-26 MED ORDER — CALCIUM CHLORIDE 10 % IV SOLN
1.0000 g | Freq: Once | INTRAVENOUS | Status: AC
  Administered 2023-04-26: 1 g via INTRAVENOUS

## 2023-04-26 MED ORDER — TRANEXAMIC ACID-NACL 1000-0.7 MG/100ML-% IV SOLN
1000.0000 mg | Freq: Once | INTRAVENOUS | Status: AC
  Administered 2023-04-26: 1000 mg via INTRAVENOUS
  Filled 2023-04-26: qty 100

## 2023-04-26 MED ORDER — AMIODARONE HCL 200 MG PO TABS: 400.0000 mg | ORAL_TABLET | Freq: Two times a day (BID) | ORAL | Status: DC

## 2023-04-26 MED ORDER — CALCIUM CHLORIDE 10 % IV SOLN
INTRAVENOUS | Status: AC
  Filled 2023-04-26: qty 10

## 2023-04-26 MED ORDER — LIDOCAINE 2% (20 MG/ML) 5 ML SYRINGE
INTRAMUSCULAR | Status: AC
  Filled 2023-04-26: qty 5

## 2023-04-26 MED ORDER — PROPOFOL 500 MG/50ML IV EMUL
INTRAVENOUS | Status: DC | PRN
Start: 2023-04-26 — End: 2023-04-26
  Administered 2023-04-26: 10 ug/kg/min via INTRAVENOUS

## 2023-04-26 MED ORDER — SODIUM BICARBONATE 8.4 % IV SOLN
INTRAVENOUS | Status: DC | PRN
  Administered 2023-04-26 (×2): 50 meq via INTRAVENOUS

## 2023-04-26 MED ORDER — SODIUM CHLORIDE 0.9% FLUSH
3.0000 mL | Freq: Two times a day (BID) | INTRAVENOUS | Status: DC
  Administered 2023-04-27: 3 mL via INTRAVENOUS

## 2023-04-26 MED ORDER — ASPIRIN 81 MG PO CHEW: 324.0000 mg | CHEWABLE_TABLET | Freq: Every day | ORAL | Status: DC

## 2023-04-26 MED ORDER — LIDOCAINE HCL (CARDIAC) PF 100 MG/5ML IV SOSY
PREFILLED_SYRINGE | INTRAVENOUS | Status: DC | PRN
  Administered 2023-04-26: 100 mg via INTRAVENOUS

## 2023-04-26 MED ORDER — CALCIUM CHLORIDE 10 % IV SOLN
INTRAVENOUS | Status: DC | PRN
  Administered 2023-04-26: 200 mg via INTRAVENOUS
  Administered 2023-04-26: 300 mg via INTRAVENOUS
  Administered 2023-04-26: 200 mg via INTRAVENOUS
  Administered 2023-04-26: 300 mg via INTRAVENOUS

## 2023-04-26 MED ORDER — METOCLOPRAMIDE HCL 5 MG/ML IJ SOLN: 10.0000 mg | Freq: Four times a day (QID) | INTRAMUSCULAR | Status: DC

## 2023-04-26 MED ORDER — NITROGLYCERIN IN D5W 200-5 MCG/ML-% IV SOLN: 0.0000 ug/min | INTRAVENOUS | Status: DC

## 2023-04-26 MED ORDER — PANTOPRAZOLE SODIUM 40 MG PO TBEC: 40.0000 mg | DELAYED_RELEASE_TABLET | Freq: Every day | ORAL | Status: DC

## 2023-04-26 MED ORDER — POLYETHYLENE GLYCOL 3350 17 G PO PACK
17.0000 g | PACK | Freq: Every day | ORAL | Status: DC
  Administered 2023-04-28 – 2023-04-30 (×2): 17 g
  Filled 2023-04-26 (×2): qty 1

## 2023-04-26 MED ORDER — VANCOMYCIN HCL 1250 MG/250ML IV SOLN
1250.0000 mg | Freq: Two times a day (BID) | INTRAVENOUS | Status: DC
  Administered 2023-04-27 – 2023-05-01 (×9): 1250 mg via INTRAVENOUS
  Filled 2023-04-26 (×11): qty 250

## 2023-04-26 MED ORDER — METHADONE HCL IV SYRINGE 10 MG/ML FOR CABG
20.0000 mg | Freq: Once | INTRAMUSCULAR | Status: AC
  Administered 2023-04-26: 20 mg via INTRAVENOUS
  Filled 2023-04-26: qty 2

## 2023-04-26 MED ORDER — ONDANSETRON HCL 4 MG/2ML IJ SOLN
INTRAMUSCULAR | Status: AC
  Filled 2023-04-26: qty 2

## 2023-04-26 MED ORDER — MIDAZOLAM HCL (PF) 5 MG/ML IJ SOLN
INTRAMUSCULAR | Status: DC | PRN
  Administered 2023-04-26 (×2): 2 mg via INTRAVENOUS

## 2023-04-26 MED ORDER — EPINEPHRINE HCL 5 MG/250ML IV SOLN IN NS
0.0000 ug/min | INTRAVENOUS | Status: DC
  Administered 2023-04-27: 3 ug/min via INTRAVENOUS
  Administered 2023-04-27: 1 ug/min via INTRAVENOUS
  Filled 2023-04-26: qty 250

## 2023-04-26 MED ORDER — ROSUVASTATIN CALCIUM 5 MG PO TABS: 10.0000 mg | ORAL_TABLET | Freq: Every day | ORAL | Status: DC

## 2023-04-26 MED ORDER — MIDAZOLAM HCL 2 MG/2ML IJ SOLN: 1.0000 mg | INTRAMUSCULAR | Status: DC | PRN

## 2023-04-26 MED ORDER — MIDAZOLAM HCL (PF) 5 MG/ML IJ SOLN
INTRAMUSCULAR | Status: DC | PRN
  Administered 2023-04-26: 2 mg via INTRAVENOUS

## 2023-04-26 MED ORDER — SODIUM CHLORIDE 0.9 % IV SOLN
250.0000 mL | INTRAVENOUS | Status: DC
  Administered 2023-04-27: 250 mL via INTRAVENOUS

## 2023-04-26 MED ORDER — 0.9 % SODIUM CHLORIDE (POUR BTL) OPTIME
TOPICAL | Status: DC | PRN
  Administered 2023-04-26: 5000 mL

## 2023-04-26 MED ORDER — SODIUM BICARBONATE 8.4 % IV SOLN
INTRAVENOUS | Status: AC
  Filled 2023-04-26: qty 50

## 2023-04-26 MED ORDER — PROPOFOL 10 MG/ML IV BOLUS
INTRAVENOUS | Status: DC | PRN
  Administered 2023-04-26: 20 mg via INTRAVENOUS
  Administered 2023-04-26: 40 mg via INTRAVENOUS
  Administered 2023-04-26 (×2): 50 mg via INTRAVENOUS
  Administered 2023-04-26: 40 mg via INTRAVENOUS

## 2023-04-26 MED ORDER — METOPROLOL TARTRATE 12.5 MG HALF TABLET: 12.5000 mg | ORAL_TABLET | Freq: Two times a day (BID) | ORAL | Status: DC

## 2023-04-26 MED ORDER — INSULIN REGULAR(HUMAN) IN NACL 100-0.9 UT/100ML-% IV SOLN
INTRAVENOUS | Status: DC
  Administered 2023-04-26: 1.4 [IU]/h via INTRAVENOUS

## 2023-04-26 MED ORDER — MIDAZOLAM BOLUS VIA INFUSION: 4.0000 mg | INTRAVENOUS | Status: DC | PRN

## 2023-04-26 MED ORDER — PLASMA-LYTE A IV SOLN
INTRAVENOUS | Status: DC | PRN
  Administered 2023-04-26: 900 mL

## 2023-04-26 MED ORDER — ROCURONIUM BROMIDE 10 MG/ML (PF) SYRINGE
PREFILLED_SYRINGE | INTRAVENOUS | Status: AC
  Administered 2023-04-26: 100 mg
  Filled 2023-04-26: qty 10

## 2023-04-26 MED ORDER — HEPARIN SODIUM (PORCINE) 1000 UNIT/ML IJ SOLN
INTRAMUSCULAR | Status: DC | PRN
  Administered 2023-04-26: 30000 [IU] via INTRAVENOUS

## 2023-04-26 MED ORDER — ACETAMINOPHEN 160 MG/5ML PO SOLN: 1000.0000 mg | Freq: Four times a day (QID) | ORAL | Status: DC

## 2023-04-26 MED ORDER — PHENYLEPHRINE HCL (PRESSORS) 10 MG/ML IV SOLN
INTRAVENOUS | Status: AC
  Filled 2023-04-26: qty 1

## 2023-04-26 SURGICAL SUPPLY — 75 items
ADAPTER CARDIO PERF ANTE/RETRO (ADAPTER) ×2 IMPLANT
BAG DECANTER FOR FLEXI CONT (MISCELLANEOUS) ×2 IMPLANT
BLADE CLIPPER SURG (BLADE) ×2 IMPLANT
BLADE STERNUM SYSTEM 6 (BLADE) ×2 IMPLANT
BNDG ELASTIC 4INX 5YD STR LF (GAUZE/BANDAGES/DRESSINGS) IMPLANT
BNDG ELASTIC 6INX 5YD STR LF (GAUZE/BANDAGES/DRESSINGS) IMPLANT
BNDG GAUZE DERMACEA FLUFF 4 (GAUZE/BANDAGES/DRESSINGS) IMPLANT
CANISTER SUCT 3000ML PPV (MISCELLANEOUS) ×2 IMPLANT
CANNULA LEFT HEART VENT 20FR (CATHETERS) IMPLANT
CANNULA NON VENT 20FR 12 (CANNULA) ×2 IMPLANT
CANNULA VESSEL 3MM BLUNT TIP (CANNULA) IMPLANT
CATH HEART VENT LEFT (CATHETERS) ×2 IMPLANT
CATH RETROPLEGIA CORONARY 14FR (CATHETERS) ×2 IMPLANT
CATH ROBINSON RED A/P 18FR (CATHETERS) ×6 IMPLANT
CATH THOR STR 32F SOFT 20 RADI (CATHETERS) ×2 IMPLANT
CATH THORACIC 28FR RT ANG (CATHETERS) ×2 IMPLANT
CLIP TI MEDIUM 24 (CLIP) IMPLANT
CLIP TI WIDE RED SMALL 24 (CLIP) IMPLANT
DEV SUMP PERICARDIAL 20F 15IN (MISCELLANEOUS) IMPLANT
DEVICE SUT CK QUICK LOAD MINI (Prosthesis & Implant Heart) IMPLANT
DRAPE INCISE IOBAN 66X45 STRL (DRAPES) IMPLANT
DRSG AQUACEL AG ADV 3.5X10 (GAUZE/BANDAGES/DRESSINGS) ×2 IMPLANT
ELECT CAUTERY BLADE 6.4 (BLADE) ×2 IMPLANT
ELECT REM PT RETURN 9FT ADLT (ELECTROSURGICAL) ×4
ELECTRODE REM PT RTRN 9FT ADLT (ELECTROSURGICAL) ×4 IMPLANT
FELT TEFLON 1X6 (MISCELLANEOUS) ×4 IMPLANT
GAUZE SPONGE 4X4 12PLY STRL (GAUZE/BANDAGES/DRESSINGS) ×4 IMPLANT
GLOVE BIO SURGEON STRL SZ 6.5 (GLOVE) IMPLANT
GOWN STRL REUS W/ TWL LRG LVL3 (GOWN DISPOSABLE) ×12 IMPLANT
GOWN STRL REUS W/ TWL XL LVL3 (GOWN DISPOSABLE) IMPLANT
HEMOSTAT SURGICEL 2X14 (HEMOSTASIS) IMPLANT
INSERT FOGARTY XLG (MISCELLANEOUS) ×2 IMPLANT
KIT BASIN OR (CUSTOM PROCEDURE TRAY) ×2 IMPLANT
KIT SUCTION CATH 14FR (SUCTIONS) ×2 IMPLANT
KIT SUT CK MINI COMBO 4X17 (Prosthesis & Implant Heart) IMPLANT
KIT TURNOVER KIT B (KITS) ×2 IMPLANT
KNIFE MICRO-UNI 3.5 30 DEG (BLADE) IMPLANT
LINE VENT (MISCELLANEOUS) IMPLANT
MARKER DISTAL GRAFT W/ HOLDER (MISCELLANEOUS) IMPLANT
NS IRRIG 1000ML POUR BTL (IV SOLUTION) ×10 IMPLANT
ORGANIZER SUTURE GABBAY-FRATER (MISCELLANEOUS) ×2 IMPLANT
PACK E OPEN HEART (SUTURE) ×2 IMPLANT
PACK OPEN HEART (CUSTOM PROCEDURE TRAY) ×2 IMPLANT
PAD ARMBOARD 7.5X6 YLW CONV (MISCELLANEOUS) ×4 IMPLANT
PAD ELECT DEFIB RADIOL ZOLL (MISCELLANEOUS) ×2 IMPLANT
PENCIL BUTTON HOLSTER BLD 10FT (ELECTRODE) ×2 IMPLANT
POSITIONER HEAD DONUT 9IN (MISCELLANEOUS) ×2 IMPLANT
PUNCH AORTIC ROTATE 4.5MM 8IN (MISCELLANEOUS) ×2 IMPLANT
SEALANT SURG COSEAL 8ML (VASCULAR PRODUCTS) IMPLANT
SET MPS 3-ND DEL (MISCELLANEOUS) IMPLANT
SPONGE T-LAP 18X18 ~~LOC~~+RFID (SPONGE) IMPLANT
SUT BONE WAX W31G (SUTURE) ×2 IMPLANT
SUT EB EXC GRN/WHT 2-0 V-5 (SUTURE) ×4 IMPLANT
SUT MNCRL AB 4-0 PS2 18 (SUTURE) ×4 IMPLANT
SUT PROLENE 4 0 SH DA (SUTURE) ×2 IMPLANT
SUT PROLENE 4-0 RB1 .5 CRCL 36 (SUTURE) IMPLANT
SUT PROLENE 5 0 C 1 36 (SUTURE) IMPLANT
SUT PROLENE 6 0 C 1 30 (SUTURE) IMPLANT
SUT PROLENE 7 0 BV 1 (SUTURE) IMPLANT
SUT STEEL SZ 6 DBL 3X14 BALL (SUTURE) ×4 IMPLANT
SUT VIC AB 0 CTX36XBRD ANTBCTR (SUTURE) ×4 IMPLANT
SUT VIC AB 2-0 CT1 TAPERPNT 27 (SUTURE) ×4 IMPLANT
SYSTEM SAHARA CHEST DRAIN ATS (WOUND CARE) ×2 IMPLANT
TAPE PAPER 3X10 WHT MICROPORE (GAUZE/BANDAGES/DRESSINGS) IMPLANT
TOWEL GREEN STERILE (TOWEL DISPOSABLE) ×2 IMPLANT
TOWEL GREEN STERILE FF (TOWEL DISPOSABLE) ×2 IMPLANT
TRAY FOLEY SLVR 16FR TEMP STAT (SET/KITS/TRAYS/PACK) ×2 IMPLANT
TUBE CONNECTING 12X1/4 (SUCTIONS) IMPLANT
TUBE CONNECTING 20X1/4 (TUBING) IMPLANT
TUBE SUCTION CARDIAC 10FR (CANNULA) IMPLANT
UNDERPAD 30X36 HEAVY ABSORB (UNDERPADS AND DIAPERS) ×2 IMPLANT
VALVE AORTIC KONECT RESILIA 23 (Valve) IMPLANT
VENT LEFT HEART 12002 (CATHETERS) ×2
WATER STERILE IRR 1000ML POUR (IV SOLUTION) ×4 IMPLANT
YANKAUER SUCT BULB TIP NO VENT (SUCTIONS) IMPLANT

## 2023-04-26 SURGICAL SUPPLY — 48 items
BAG DECANTER FOR FLEXI CONT (MISCELLANEOUS) ×2 IMPLANT
BANDAGE ESMARK 6X9 LF (GAUZE/BANDAGES/DRESSINGS) IMPLANT
BNDG ESMARK 6X9 LF (GAUZE/BANDAGES/DRESSINGS) ×2
BNDG GAUZE DERMACEA FLUFF 4 (GAUZE/BANDAGES/DRESSINGS) IMPLANT
CANISTER SUCT 3000ML PPV (MISCELLANEOUS) ×2 IMPLANT
CANNULA AORTIC ROOT 9FR (CANNULA) IMPLANT
CATH ROBINSON RED A/P 18FR (CATHETERS) IMPLANT
CAUTERY HI TEMP FINE TIP 2 (MISCELLANEOUS) IMPLANT
CONN ST 3/8 X 1/2 (MISCELLANEOUS) IMPLANT
CONNECTOR STRAIGHT 3/8 (MISCELLANEOUS) IMPLANT
COVER MAYO STAND STRL (DRAPES) IMPLANT
DRAPE CARDIOVASC SPLIT 88X140 (DRAPES) ×2 IMPLANT
DRAPE INCISE IOBAN 66X45 STRL (DRAPES) IMPLANT
DRAPE PERI GROIN 82X75IN TIB (DRAPES) ×2 IMPLANT
DRESSING AQUACEL AG SP 3.5X10 (GAUZE/BANDAGES/DRESSINGS) IMPLANT
DRSG AQUACEL AG ADV 3.5X14 (GAUZE/BANDAGES/DRESSINGS) IMPLANT
DRSG AQUACEL AG SP 3.5X10 (GAUZE/BANDAGES/DRESSINGS)
ELECT CAUTERY BLADE 6.4 (BLADE) IMPLANT
ELECT REM PT RETURN 9FT ADLT (ELECTROSURGICAL) ×4
ELECTRODE REM PT RTRN 9FT ADLT (ELECTROSURGICAL) ×4 IMPLANT
FELT TEFLON 1X6 (MISCELLANEOUS) IMPLANT
GAUZE SPONGE 4X4 12PLY STRL (GAUZE/BANDAGES/DRESSINGS) IMPLANT
GLOVE ECLIPSE 7.5 STRL STRAW (GLOVE) ×4 IMPLANT
GOWN STRL REUS W/ TWL LRG LVL3 (GOWN DISPOSABLE) ×6 IMPLANT
GOWN STRL REUS W/ TWL XL LVL3 (GOWN DISPOSABLE) ×4 IMPLANT
HEMOSTAT SURGICEL 2X14 (HEMOSTASIS) IMPLANT
KIT BASIN OR (CUSTOM PROCEDURE TRAY) ×2 IMPLANT
KIT SUCTION CATH 14FR (SUCTIONS) IMPLANT
KIT TURNOVER KIT B (KITS) ×2 IMPLANT
NDL SUT 4 .5 CRC FRENCH EYE (NEEDLE) IMPLANT
NS IRRIG 1000ML POUR BTL (IV SOLUTION) ×10 IMPLANT
PACK E OPEN HEART (SUTURE) IMPLANT
PAD ARMBOARD 7.5X6 YLW CONV (MISCELLANEOUS) ×4 IMPLANT
PAD ELECT DEFIB RADIOL ZOLL (MISCELLANEOUS) ×2 IMPLANT
POSITIONER HEAD DONUT 9IN (MISCELLANEOUS) ×2 IMPLANT
SET MPS 3-ND DEL (MISCELLANEOUS) IMPLANT
SET TUBING HLS ADVANCED 7.0 (TUBING) IMPLANT
SUT PROLENE 2 0 MH 48 (SUTURE) IMPLANT
SUT PROLENE 4 0 SH DA (SUTURE) IMPLANT
SUT PROLENE 4-0 RB1 .5 CRCL 36 (SUTURE) IMPLANT
SUT PROLENE 7 0 BV1 MDA (SUTURE) IMPLANT
SYR TOOMEY 50ML (SYRINGE) IMPLANT
SYSTEM SAHARA CHEST DRAIN ATS (WOUND CARE) ×2 IMPLANT
TAPE CLOTH SURG 4X10 WHT LF (GAUZE/BANDAGES/DRESSINGS) IMPLANT
TOWEL GREEN STERILE (TOWEL DISPOSABLE) ×2 IMPLANT
TUBING MEDICAL 3X8X3X32 (MISCELLANEOUS) IMPLANT
UNDERPAD 30X36 HEAVY ABSORB (UNDERPADS AND DIAPERS) ×2 IMPLANT
WATER STERILE IRR 1000ML POUR (IV SOLUTION) ×4 IMPLANT

## 2023-04-26 NOTE — Anesthesia Procedure Notes (Signed)
Arterial Line Insertion Start/End11/20/2024 7:50 AM, 04/26/2023 7:55 AM Performed by: [place], MD, Yolonda Kida, CRNA, CRNA  Patient location: Pre-op. Preanesthetic checklist: patient identified, IV checked, site marked, risks and benefits discussed, surgical consent, monitors and equipment checked, pre-op evaluation, timeout performed and anesthesia consent Lidocaine 1% used for infiltration Left, radial was placed Catheter size: 20 G Hand hygiene performed  and maximum sterile barriers used   Attempts: 1 Procedure performed without using ultrasound guided technique. Following insertion, dressing applied and Biopatch. Post procedure assessment: normal  Patient tolerated the procedure well with no immediate complications.

## 2023-04-26 NOTE — Consult Note (Addendum)
NAME:  Johnathan Anderson, MRN:  [id number], DOB:  [birth year], LOS: 0 ADMISSION DATE:  04/26/2023, CONSULTATION DATE:  04/26/23 REFERRING MD:  Dr. Leafy Ro, CHIEF COMPLAINT:  postop   History of Present Illness:  Pt encephalopathic, therefore HPI obtained from EMR.    64 yoM with hx of bicuspid AV w/ AS, aortic aneurysm, HTN, HLD GERD, and former smoker who was admitted for elective AVR.  Pt followed for progressive bicuspid aortic stenosis and moderate ascending aortic aneurysm.  Asymptomatic.  Recent echo showing normal LV function and mean gradient of 39 mmHg.  No evidence of CAD or PHTN on preop workup.  Pt elected to repair issue now prior to becoming symptomatic.   Underwent elective bioprosthetic AVR with bental procedure complicated by possible cardioplegia vs coronary insufficiency coming off bypass requiring CABG x 2, RSVG from aortic graft to RCA and LAD and remains paced.   EBL 1250 Pump time 1005- 1428 S/p FFP x2, plts, cell saver 693, UOP 1100, paralytics not reversed, given methadone   ACT prior to ICU, 145 after second protamine Arrived on epi  Pertinent  Medical History  Bicuspid AV w/ AS, aortic aneurysm, HTN, GERD, HLD, vertigo, former smoker  Significant Hospital Events: Including procedures, antibiotic start and stop dates in addition to other pertinent events   11/20 AVR/ bental procedure  Interim History / Subjective:  See above 390 ml CT output on ICU arrival Epi 5, NE 10, third albumin running  Objective   Blood pressure (!) 147/94, pulse (!) 55, temperature 97.7 F (36.5 C), temperature source Oral, resp. rate 15, height 5\' 6"  (1.676 m), weight 90.7 kg, SpO2 96%. PAP: (21-46)/(8-29) 22/10      Intake/Output Summary (Last 24 hours) at 04/26/2023 1228 Last data filed at 04/26/2023 1147 Gross per 24 hour  Intake 1700 ml  Output 550 ml  Net 1150 ml   Filed Weights   04/26/23 0644  Weight: 90.7 kg   Examination: General:  critically ill adult male lying  in bed in NAD HEENT: MM pink/moist, ETT/ OGT, pupils 3/reactive Neuro: sedated  CV: AV paced, midline sternal dressing cdi, CT x2 w/ serosangious PULM:  MV supported, clear  GI: soft, bs scant, ND, foley- cyu Extremities: warm/dry, BLE wrapped in ACE  Skin: no rashes   ABG > 7.225/ 46.7/ 83/ 19.6, Hgb 7.8, iCa 1.23, K 3.4  Resolved Hospital Problem list    Assessment & Plan:   Bicuspid AV with severe AS and ascending aortic aneurysm s/p AVR/ Bental procedure) complicated by possible cardioplegia vs coronary insufficiency s/p CABG x 2 (RSVG from aortic graft to RCA and LAD via right and left greater saphenous vein harvest)  Post bypass vasoplegia Acute postoperative respiratory insufficiency Expected post-operative ABLA Expected post-operative consumptive thrombocytopenia  HTN HLD P:  - post-op management per TCTS - tele-monitoring, cont pacing- currently VVD, rate 90, underlying rhythm remains asystolic  - rapid wean per TCTS protocol if remains hemodynamic stable - VAP/ PPI - CXR/ ABG, CBC, BMET ,coags now> trend - vent rate adjusted based off ABG - 2 FFP, 1 platelets and cryo now, additional albumin now - finishing TXA - wean pressors, epi first, as able to maintain MAP >65. Trending hemodynamics.  Albumin prn  - warming measures - mediastinal drains per TCTS> 390 ml out since OR, monitor closely  - multimodal pain control per protocol- oxycodone, tramadol, morphine with bowel regimen - statin to resume next day  - complete post-op antibiotics - monitor electrolytes, replete PRN -  add back BB once off pressors and underlying rhythm returns - may have some bloody OG secretions given some difficulty with passing TEE, monitor   GERD - PPI  Best Practice (right click and "Reselect all SmartList Selections" daily)   Diet/type: NPO DVT prophylaxis: SCD GI prophylaxis: PPI Lines: Central line and Arterial Line Foley:  Yes, and it is still needed Code Status:  full  code Last date of multidisciplinary goals of care discussion [per primary team]  Labs   CBC: Recent Labs  Lab 04/24/23 1057 04/26/23 0900 04/26/23 1011 04/26/23 1016 04/26/23 1039 04/26/23 1140 04/26/23 1145  WBC 6.0  --   --   --   --   --   --   HGB 15.0   < > 9.5* 9.2* 9.5* 8.8* 9.5*  HCT 43.8   < > 28.0* 27.0* 28.0* 26.0* 27.2*  MCV 86.6  --   --   --   --   --   --   PLT 151  --   --   --   --   --  100*   < > = values in this interval not displayed.    Basic Metabolic Panel: Recent Labs  Lab 04/24/23 1057 04/26/23 0900 04/26/23 0954 04/26/23 1011 04/26/23 1016 04/26/23 1039 04/26/23 1140  NA 138 142 140 138 138 137 136  K 4.0 3.9 3.7 4.4 4.2 5.2* 5.5*  CL 106 104 106  --   --  102 102  CO2 26  --   --   --   --   --   --   GLUCOSE 103* 105* 114*  --   --  105* 115*  BUN 16 16 14   --   --  16 16  CREATININE 1.04 0.90 0.80  --   --  0.80 0.80  CALCIUM 9.6  --   --   --   --   --   --    GFR: Estimated Creatinine Clearance: 104.9 mL/min (by C-G formula based on SCr of 0.8 mg/dL). Recent Labs  Lab 04/24/23 1057  WBC 6.0    Liver Function Tests: Recent Labs  Lab 04/24/23 1057  AST 20  ALT 22  ALKPHOS 48  BILITOT 0.8  PROT 7.4  ALBUMIN 4.0   No results for input(s): "LIPASE", "AMYLASE" in the last 168 hours. No results for input(s): "AMMONIA" in the last 168 hours.  ABG    Component Value Date/Time   PHART 7.431 04/26/2023 1011   PCO2ART 38.1 04/26/2023 1011   PO2ART 367 (H) 04/26/2023 1011   HCO3 26.3 04/26/2023 1016   TCO2 26 04/26/2023 1140   ACIDBASEDEF 1.0 12/28/2022 1239   ACIDBASEDEF 2.0 12/28/2022 1239   O2SAT 78 04/26/2023 1016     Coagulation Profile: Recent Labs  Lab 04/24/23 1057  INR 1.1    Cardiac Enzymes: No results for input(s): "CKTOTAL", "CKMB", "CKMBINDEX", "TROPONINI" in the last 168 hours.  HbA1C: Hgb A1c MFr Bld  Date/Time Value Ref Range Status  04/24/2023 10:57 AM 5.5 4.8 - 5.6 % Final    Comment:     (NOTE) Pre diabetes:          5.7%-6.4%  Diabetes:              >6.4%  Glycemic control for   <7.0% adults with diabetes     CBG: No results for input(s): "GLUCAP" in the last 168 hours.  Review of Systems:   unable  Past Medical History:  He,  has a past medical history of Aortic aneurysm (HCC), Arthritis, Cancer (HCC) (2022), GERD (gastroesophageal reflux disease), Headache, Heart murmur, Hyperlipidemia, Hypertension, Peripheral vascular disease (HCC), and Vertigo.   Surgical History:   Past Surgical History:  Procedure Laterality Date   APPENDECTOMY     1990s   COLONOSCOPY WITH ESOPHAGOGASTRODUODENOSCOPY (EGD)     2020s   EYE SURGERY Right    eye tissue removed   MOUTH SURGERY  2022   Skin Cancer Removal   RIGHT/LEFT HEART CATH AND CORONARY ANGIOGRAPHY N/A 12/28/2022   Procedure: RIGHT/LEFT HEART CATH AND CORONARY ANGIOGRAPHY;  Surgeon: [place], Peter M, MD;  Location: [facility] INVASIVE CV LAB;  Service: Cardiovascular;  Laterality: N/A;   STERIOD INJECTION  09/16/2011   Procedure: MINOR STEROID INJECTION;  Surgeon: Mat Carne, MD;  Location: [facility];  Service: Orthopedics;  Laterality: Left;  Left Lumbar Five-Sacral One Transforaminal Epidural Steroid Injection   TEE WITHOUT CARDIOVERSION N/A 11/05/2020   Procedure: TRANSESOPHAGEAL ECHOCARDIOGRAM (TEE);  Surgeon: Christell Constant, MD;  Location: [facility] ENDOSCOPY;  Service: Cardiovascular;  Laterality: N/A;     Social History:   reports that he has quit smoking. His smoking use included cigarettes. He has never used smokeless tobacco. He reports that he does not drink alcohol and does not use drugs.   Family History:  His family history includes Diabetes in his father; Hypertension in his mother. There is no history of Migraines.   Allergies Allergies  Allergen Reactions   Levaquin [Levofloxacin]     unknown     Home Medications  Prior to Admission medications   Medication Sig Start  Date End Date Taking? Authorizing Provider  omeprazole (PRILOSEC) 20 MG capsule Take 20 mg by mouth every morning. 05/19/21  Yes [provider]  amLODipine (NORVASC) 5 MG tablet Take 1 tablet (5 mg total) by mouth daily. Patient not taking: Reported on 04/21/2023 07/07/22   Riley Lam A, MD  ondansetron (ZOFRAN-ODT) 4 MG disintegrating tablet Take 1-2 tablets (4-8 mg total) by mouth every 8 (eight) hours as needed. 09/14/22   Anson Fret, MD  rizatriptan (MAXALT-MLT) 10 MG disintegrating tablet Take 1 tablet (10 mg total) by mouth as needed for migraine. May repeat in 2 hours if needed 09/14/22   Anson Fret, MD  rosuvastatin (CRESTOR) 10 MG tablet Take 1 tablet (10 mg total) by mouth daily. Patient not taking: Reported on 04/21/2023 07/07/22   Christell Constant, MD  topiramate (TOPAMAX) 50 MG tablet Take 1 tablet (50 mg total) by mouth at bedtime. Patient not taking: Reported on 04/21/2023 09/14/22   Anson Fret, MD     Critical care time: 35 mins      Posey Boyer, MSN, AG-ACNP-BC [facility] 04/26/2023, 4:42 PM  See Amion for pager If no response to pager , please call [phone] until 7pm After 7:00 pm call Elink  [phone]

## 2023-04-26 NOTE — Brief Op Note (Signed)
04/26/2023  8:15 AM  PATIENT:  Johnathan Anderson  57 y.o. male  PRE-OPERATIVE DIAGNOSIS:  SEVERE AORTIC STENOSIS THORACIC AORTIC ANEURYSM  POST-OPERATIVE DIAGNOSIS:  SEVERE AORTIC STENOSIS THORACIC AORTIC ANEURYSM  PROCEDURE:  AORTIC VALVE REPLACEMENT (AVR) USING 23 MM KONECT RESILIA AORTIC VALVED CONDUIT  REPLACEMENT ASCENDING AORTA USING 23 MM KONECT RESILIA AORTIC VALVED CONDUIT  TRANSESOPHAGEAL ECHOCARDIOGRAM  CORONARY ARTERY BYPASS GRAFTING (CABG) x 2 USING RIGHT AND LEFT GREATER SAPHENOUS VEIN HARVESTED OPEN Right Vein harvest time: Vein prep time: Left vein harvest time: Vein prep time: -SVG to LAD -SVG to RCA  SURGEON:  Surgeons and Role:    Eugenio Hoes, MD - Primary  PHYSICIAN ASSISTANT: Aloha Gell PA-C, Jillyn Hidden PA-C  ASSISTANTS: Kristie Cowman RNFA   ANESTHESIA:   general  EBL:  1100 mL   BLOOD ADMINISTERED: 1UPLT 1UFFP  DRAINS:  Mediastinal drains    LOCAL MEDICATIONS USED:  NONE  SPECIMEN:  Source of Specimen:  Aortic aneurysm  DISPOSITION OF SPECIMEN:  PATHOLOGY  COUNTS:  YES  DICTATION: .Dragon Dictation  PLAN OF CARE: Admit to inpatient   PATIENT DISPOSITION:  ICU - intubated and hemodynamically stable.   Delay start of Pharmacological VTE agent (>24hrs) due to surgical blood loss or risk of bleeding: yes

## 2023-04-26 NOTE — Discharge Instructions (Signed)
Discharge Instructions:  1. You may shower, please wash incisions daily with soap and water and keep dry.  If you wish to cover wounds with dressing you may do so but please keep clean and change daily.  No tub baths or swimming until incisions have completely healed.  If your incisions become red or develop any drainage please call our office at [phone]  2. No Driving until cleared by Dr. Weldner's office and you are no longer using narcotic pain medications  3. Monitor your weight daily.. Please use the same scale and weigh at same time... If you gain 5-10 lbs in 48 hours with associated lower extremity swelling, please contact our office at [phone]  4. Fever of 101.5 for at least 24 hours with no source, please contact our office at [phone]  5. Activity- up as tolerated, please walk at least 3 times per day.  Avoid strenuous activity, no lifting, pushing, or pulling with your arms over 8-10 lbs for a minimum of 6 weeks  6. If any questions or concerns arise, please do not hesitate to contact our office at [phone]  

## 2023-04-26 NOTE — Anesthesia Postprocedure Evaluation (Signed)
Anesthesia Post Note  Patient: [employer]  Procedure(s) Performed: AORTIC VALVE REPLACEMENT (AVR) USING 23 MM KONECT RESILIA AORTIC VALVED CONDUIT (Chest) REPLACEMENT ASCENDING AORTA USING 23 MM KONECT RESILIA AORTIC VALVED CONDUIT TRANSESOPHAGEAL ECHOCARDIOGRAM CORONARY ARTERY BYPASS GRAFTING (CABG) x 2 USING RIGHT AND LEFT GREATER SAPHENOUS VEIN HARVESTED OPEN (Chest)     Patient location during evaluation: SICU Anesthesia Type: General Level of consciousness: sedated Pain management: pain level controlled Vital Signs Assessment: post-procedure vital signs reviewed and stable Respiratory status: patient remains intubated per anesthesia plan Cardiovascular status: stable Postop Assessment: no apparent nausea or vomiting Anesthetic complications: no   No notable events documented.  Last Vitals:  Vitals:   04/26/23 1648 04/26/23 1651  BP:    Pulse: (!) 106 (!) 106  Resp: (!) 37 12  Temp: (!) 35.4 C   SpO2: 100% 100%    Last Pain:  Vitals:   04/26/23 1636  TempSrc: Core  PainSc:                  [place]

## 2023-04-26 NOTE — Transfer of Care (Signed)
Immediate Anesthesia Transfer of Care Note  Patient: Johnathan Anderson  Procedure(s) Performed: EXPLORATION POST OPERATIVE OPEN HEART CENTRAL CANNULATION FOR ECMO (EXTRACORPOREAL MEMBRANE OXYGENATION) (Chest)  Patient Location: ICU  Anesthesia Type:General  Level of Consciousness: Patient remains intubated per anesthesia plan  Airway & Oxygen Therapy: Patient remains intubated per anesthesia plan and Patient placed on Ventilator (see vital sign flow sheet for setting)  Post-op Assessment: Report given to RN and pt critically ill on ECMO and vasoactive gtts  Post vital signs: Reviewed  Last Vitals:  Vitals Value Taken Time  BP 95/78 04/26/23 2107  Temp    Pulse 137 04/26/23 2116  Resp 19 04/26/23 2116  SpO2 100 % 04/26/23 2116  Vitals shown include unfiled device data.  Last Pain:  Vitals:   04/26/23 1724  TempSrc: Core  PainSc:       Patients Stated Pain Goal: 0 (04/26/23 1610)  Complications: No notable events documented.

## 2023-04-26 NOTE — Anesthesia Preprocedure Evaluation (Signed)
Anesthesia Evaluation  Patient identified by MRN, date of birth, ID band Patient awake    Reviewed: Allergy & Precautions, H&P , NPO status , Patient's Chart, lab work & pertinent test results  Airway Mallampati: Intubated       Dental no notable dental hx.    Pulmonary former smoker   Pulmonary exam normal        Cardiovascular hypertension, + Peripheral Vascular Disease  + Valvular Problems/Murmurs AS  Rhythm:Regular Rate:Normal      ECHOCARDIOGRAM REPORT       Patient Name:   [facility] Date of Exam: 12/05/2022 Medical Rec #:  [id number]      Height:       66.0 in Accession #:    [id number]     Weight:       200.8 lb Date of Birth:  11/26/1965       BSA:          2.003 m Patient Age:    57 years       BP:           135/85 mmHg Patient Gender: M              HR:           59 bpm. Exam Location:  [address]  Procedure: 2D Echo, Cardiac Doppler, Color Doppler, 3D Echo and Strain Analysis  Indications:    Q23.1 Bicuspid aortic valve; I35.0 Nonrheumatic aortic (valve)                 stenosis   History:        Patient has prior history of Echocardiogram examinations, most                 recent 06/02/2022. Risk Factors:Hypertension and Former Smoker.                 Aneurysm of ascending aorta without rupture. Family history of                 sudden cardiac death.   Sonographer:    Cathie Beams RCS Referring Phys: [facility] A CHANDRASEKHAR  IMPRESSIONS    1. Left ventricular ejection fraction, by estimation, is 60 to 65%. The left ventricle has normal function. The left ventricle has no regional wall motion abnormalities. Left ventricular diastolic parameters were normal.  2. Right ventricular systolic function is normal. The right ventricular size is normal.  3. Left atrial size was moderately dilated.  4. The mitral valve is abnormal. Trivial mitral valve regurgitation. No evidence of mitral stenosis.   5. The aortic valve is bicuspid. There is severe calcifcation of the aortic valve. There is severe thickening of the aortic valve. Aortic valve regurgitation is mild. Severe aortic valve stenosis.  6. Aortic dilatation noted. There is moderate dilatation of the ascending aorta, measuring 45 mm.  7. The inferior vena cava is normal in size with greater than 50% respiratory variability, suggesting right atrial pressure of 3 mmHg.     Neuro/Psych  Headaches  negative psych ROS   GI/Hepatic Neg liver ROS,GERD  ,,  Endo/Other  negative endocrine ROS    Renal/GU negative Renal ROS  negative genitourinary   Musculoskeletal  (+) Arthritis ,    Abdominal   Peds negative pediatric ROS (+)  Hematology negative hematology ROS (+)   Anesthesia Other Findings   Reproductive/Obstetrics negative OB ROS  Anesthesia Physical Anesthesia Plan  ASA: 5 and emergent  Anesthesia Plan: General   Post-op Pain Management:    Induction: Intravenous  PONV Risk Score and Plan: 3 and Treatment may vary due to age or medical condition, Ondansetron and Midazolam  Airway Management Planned: Oral ETT  Additional Equipment: Arterial line, CVP, PA Cath, TEE, 3D TEE and Ultrasound Guidance Line Placement  Intra-op Plan:   Post-operative Plan: Post-operative intubation/ventilation  Informed Consent: I have reviewed the patients History and Physical, chart, labs and discussed the procedure including the risks, benefits and alternatives for the proposed anesthesia with the patient or authorized representative who has indicated his/her understanding and acceptance.     Dental advisory given  Plan Discussed with: CRNA  Anesthesia Plan Comments: (E1 take back for VA ECMO following code in ICU. Open chest. )         Anesthesia Quick Evaluation

## 2023-04-26 NOTE — Brief Op Note (Signed)
04/26/2023  7:03 AM  PATIENT:  Johnathan Anderson  57 y.o. male  PRE-OPERATIVE DIAGNOSIS:  POST OP BLEEDING  POST-OPERATIVE DIAGNOSIS:  POST OP BLEEDING  PROCEDURE:  Procedure(s): EXPLORATION POST OPERATIVE OPEN HEART (N/A) CENTRAL CANNULATION FOR ECMO (EXTRACORPOREAL MEMBRANE OXYGENATION) (N/A)  SURGEON:  Surgeons and Role:    * Eugenio Hoes, MD - Primary    * Lightfoot, Eliezer Lofts, MD - Assisting  PHYSICIAN ASSISTANT: Alera Quevedo PA-C  ANESTHESIA:   general  EBL:  6315 mL   BLOOD ADMINISTERED:see anesthesia and perfusion records  DRAINS:  MEDIASTINAL CHEST TUBES    LOCAL MEDICATIONS USED:  NONE  SPECIMEN:  No Specimen  DISPOSITION OF SPECIMEN:  N/A  COUNTS:  YES  TOURNIQUET:  * No tourniquets in log *  DICTATION: .Dragon Dictation  PLAN OF CARE: Admit to inpatient   PATIENT DISPOSITION:   GUARDED CONDITION TO SICU ON VA ECMO   Delay start of Pharmacological VTE agent (>24hrs) due to surgical blood loss or risk of bleeding: yes

## 2023-04-26 NOTE — Transfer of Care (Signed)
Immediate Anesthesia Transfer of Care Note  Patient: Johnathan Anderson  Procedure(s) Performed: AORTIC VALVE REPLACEMENT (AVR) USING 23 MM KONECT RESILIA AORTIC VALVED CONDUIT (Chest) REPLACEMENT ASCENDING AORTA USING 23 MM KONECT RESILIA AORTIC VALVED CONDUIT TRANSESOPHAGEAL ECHOCARDIOGRAM CORONARY ARTERY BYPASS GRAFTING (CABG) x 2 USING RIGHT AND LEFT GREATER SAPHENOUS VEIN HARVESTED OPEN (Chest)  Patient Location: ICU  Anesthesia Type:General  Level of Consciousness: Patient remains intubated per anesthesia plan  Airway & Oxygen Therapy: Patient remains intubated per anesthesia plan and Patient placed on Ventilator (see vital sign flow sheet for setting)  Post-op Assessment: Report given to RN and Post -op Vital signs reviewed and stable  Post vital signs: Reviewed and stable  Last Vitals:  Vitals Value Taken Time  BP 89/60 1610  Temp    Pulse 108 04/26/23 1611  Resp 9 04/26/23 1609  SpO2 96 % 04/26/23 1611  Vitals shown include unfiled device data.  Last Pain:  Vitals:   04/26/23 0658  TempSrc:   PainSc: 0-No pain      Patients Stated Pain Goal: 0 (04/26/23 1478)  Complications: No notable events documented.

## 2023-04-26 NOTE — Op Note (Signed)
CARDIOVASCULAR SURGERY OPERATIVE NOTE  04/26/2023 Johnathan Anderson [id number]  Surgeon:  Ashley Akin, MD  First Assistant: Aloha Gell and Jillyn [facility]                               An experienced assistant was required given the complexity of this surgery and the standard of surgical care. The assistant was needed for exposure, dissection, suctioning, retraction of delicate tissues and sutures, instrument exchange and for overall help during this procedure.     Preoperative Diagnosis: Bicuspid aortic stenosis                                             Ascending aortic aneurysm     Postoperative Diagnosis:  Same plus possible coronary insufficiency vs cardioplegia protection issue   Procedure: Bental Procedure (aortic root replacement) with a 23 mm Connect Pericardial valve conduit Coronary artery bypass x 2 with RSVG from the aortic graft to the RCA and from that vein bypass to the LAD  Anesthesia:  General Endotracheal   Clinical History/Surgical Indication:  57 yo male with now severe bicuspid aortic stenosis with normal LV function and without CAD or PHTN. Pt with NYHA class 0 symptoms. Pt feels concerned with waiting especially knowing that this will progress over time and he can wait till symptoms start, but overall feels to get out of the way now. We discussed all the risks and goals of surgery (including ascending aortic replacement) and options for mechanical vs tissue valve and he wishes to have a tissue valve replacement to avoid coumadin.   Findings: The left ventricle was extremely hypertrophied.  The aortic valve was bicuspid with a raphae between the left and right coronary cusps.  The sinotubular junction was extremely narrow with being just about 20 mm in diameter.  The ascending aorta was approximately 45 mm in diameter and had a neck at the takeoff of the innominate artery.  Secondary to the annulus being able to only accommodate a 21 mm valve was felt  best to increase his valve orifice with a 23 mm valve which would only be able to be performed by a valve conduit.  This went without incident however there was inability of the heart to be for period of time after the cross-clamp was off which did not improve with pacing eventually however there was right and left ventricular dysfunction on trying to wean from bypass and therefore it was felt best to graft both the right and the LAD vessels to rule out coronary ischemia.  Following this the patient had normal ventricular function with extremely thick ventricular walls with a well-seated valve.  Patient was suffering from a coagulopathy immediately postop.  Preparation:  The patient was seen in the preoperative holding area and the correct patient, correct operation were confirmed with the patient after reviewing the medical record and catheterization. The consent was signed by me. Preoperative antibiotics were given. A pulmonary arterial line and radial arterial line were placed by the anesthesia team. The patient was taken back to the operating room and positioned supine on the operating room table. After being placed under general endotracheal anesthesia by the anesthesia team a foley catheter was placed. The neck, chest, abdomen, and both legs were prepped with betadine soap and solution and draped in the usual  sterile manner. A surgical time-out was taken and the correct patient and operative procedure were confirmed with the nursing and anesthesia staff.  Operation: Median sternotomy incision was then created and the sternum above the sternal saw.  Pericardial well was developed and heparin was delivered.  Aorta was cannulated high following the takeoff of the innominate artery with a 20 mm Starns aortic cannula and a two-stage cannula was placed in right atrium for venous return.  An antegrade cardioplegia catheter was placed in the ascending aorta.  With adequate confirmation of anticoagulation  cardiopulmonary bypass was instituted.  Aortic cross-clamp was placed just below the takeoff of the innominate artery and [place] blood cardioplegia was delivered for approximately 1400 cc.  Reanimation dose was delivered sporadic cross-clamp removal. The aorta was resected below the aortic cross-clamp to the sinotubular junction.  Was at this time with the sinotubular junction orifice was very small and therefore elected to perform the aortic root replacement.  The coronary sinuses were resected and coronary buttons fashioned.  Utilizing fifteen 2-0 Tevdek pledgeted sutures with the pledgets on the exterior of the aorta a 23 mm connect pericardial tissue valve valve conduit was then secured with the cor knot system.  Coronary buttons were then sewn onto the graft at the appropriate position with running 5-0 Prolene sutures.  CoSeal was then applied to the suture lines.  Distal suture line was then performed with a running 4-0 Prolene suture with the patient in headdown position aortic cross-clamp was removed. Again ventricular and atrial pacing wires were placed and the patient continued to have asystole for a prolonged period of time there was no ventricular fibrillation just asystole.  At approximately 40 minutes patient started to respond to cardio pacing and with an attempt to wean following de-airing the ventricular function remained poor.  This did not improve with further resting on the heart-lung machine and to rule out coronary ischemia vein was harvested from both upper thighs to be fashion for saphenous vein graft bypasses which first was placed to the right coronary artery with a running 7-0 Prolene suture was brought off the aortic graft following placement of a partial occlusion clamp.  Partial-occlusion clamp was removed with very much improved right ventricular function however there was still left ventricular dysfunction and this was remedied by taking a second piece of vein off of the vein  graft to the right coronary artery and running distally to the mid LAD this was also performed with running 7-0 Prolene sutures and all of this was performed with the heart beating. Following this the patient was weaned from cardiopulmonary bypass now with normalizing of the ventricular function with some inotropic support.  Cardiopulmonary bypass was weaned off protamine was delivered and the patient was decannulated and sites oversewn necessary.  Chest tubes were brought inferior stab wounds and secured secondary coagulopathy patient received fresh frozen plasma and platelets.  With adequate hemostasis chest tubes were brought inferior stab wounds and secured and the sternum was reapproximated with interrupted stainless steel wires and the presternal subcutaneous tissue and skin were closed multiple layers absorbable suture.  Sterile dressings were applied.

## 2023-04-26 NOTE — Anesthesia Postprocedure Evaluation (Signed)
Anesthesia Post Note  Patient: Johnathan Anderson  Procedure(s) Performed: EXPLORATION POST OPERATIVE OPEN HEART CENTRAL CANNULATION FOR ECMO (EXTRACORPOREAL MEMBRANE OXYGENATION) (Chest)     Patient location during evaluation: SICU Anesthesia Type: General Level of consciousness: sedated Pain management: pain level controlled Vital Signs Assessment: post-procedure vital signs reviewed and stable Respiratory status: patient remains intubated per anesthesia plan Cardiovascular status: stable and unstable Postop Assessment: no apparent nausea or vomiting Anesthetic complications: no  No notable events documented.  Last Vitals:  Vitals:   04/26/23 1744 04/26/23 1745  BP:    Pulse: 89 89  Resp: (!) 23 (!) 31  Temp: (!) 35.1 C (!) 35.1 C  SpO2: 93% 94%    Last Pain:  Vitals:   04/26/23 1724  TempSrc: Core  PainSc:                  Azizi Bally S

## 2023-04-26 NOTE — Progress Notes (Signed)
I responded to a page from the nurse to provide spiritual support for the patient's family. I arrive on the unit and the patient's family was in the waiting room. The patient was transported to the OR. I provided spiritual support through pastoral presence, by sharing words of encouragement, and leading in prayer.    04/26/23 1825  Spiritual Encounters  Type of Visit Initial  Care provided to: Family;Pt not available  Conversation partners present during encounter Nurse  Referral source Nurse (RN/NT/LPN)  Reason for visit Urgent spiritual support  OnCall Visit Yes  Interventions  Spiritual Care Interventions Made Established relationship of care and support;Compassionate presence;Prayer;Encouragement    Chaplain Dr Melvyn Novas

## 2023-04-26 NOTE — Progress Notes (Addendum)
ECMO INITIATION   Patient: Johnathan Anderson, [birth year], 57 y.o. Location:   Date of Service:  04/26/2023     Time: 9:55 PM  Date of Admission: 04/26/2023 Admitting diagnosis: S/P AVR (aortic valve replacement)  Ht: 5\' 6"  (167.6 cm) Wt: 90.7 kg BSA: Body surface area is 2.06 meters squared.  Blood Type: A POS Performed at [facility], [address] [place], [place] [address]  Allergies:  Allergies  Allergen Reactions   Levaquin [Levofloxacin]     unknown    Past medical history:  Past Medical History:  Diagnosis Date   Aortic aneurysm (HCC)    Arthritis    Left Knee   Cancer (HCC) 2022   Skin Cancer   GERD (gastroesophageal reflux disease)    Headache    Migraines   Heart murmur    Hyperlipidemia    Hypertension    Peripheral vascular disease (HCC)    Thoracic Aortic Aneurysm   Vertigo    Past surgical history:  Past Surgical History:  Procedure Laterality Date   APPENDECTOMY     1990s   COLONOSCOPY WITH ESOPHAGOGASTRODUODENOSCOPY (EGD)     2020s   EYE SURGERY Right    eye tissue removed   MOUTH SURGERY  2022   Skin Cancer Removal   RIGHT/LEFT HEART CATH AND CORONARY ANGIOGRAPHY N/A 12/28/2022   Procedure: RIGHT/LEFT HEART CATH AND CORONARY ANGIOGRAPHY;  Surgeon: [place], Peter M, MD;  Location: [facility] INVASIVE CV LAB;  Service: Cardiovascular;  Laterality: N/A;   STERIOD INJECTION  09/16/2011   Procedure: MINOR STEROID INJECTION;  Surgeon: Mat Carne, MD;  Location: [facility];  Service: Orthopedics;  Laterality: Left;  Left Lumbar Five-Sacral One Transforaminal Epidural Steroid Injection   TEE WITHOUT CARDIOVERSION N/A 11/05/2020   Procedure: TRANSESOPHAGEAL ECHOCARDIOGRAM (TEE);  Surgeon: Christell Constant, MD;  Location: [facility] ENDOSCOPY;  Service: Cardiovascular;  Laterality: N/A;    Indication for ECMO: Post-Cardiotomy  ECMO was deployed at  1810and initiated at 1915 . Cannulated for ECMO Mode: VA and achieved initial ECMO  Flow (LPM): 3.63 and ECMO Sweep Gas (LPM): 3.    ECMO Cannula Information     Staff Present  Primary Perfusionist [facility]  Assisting Perfusionist/ECMO Specialist Devota Pace RN  Cannulating Physician Weldner MD   ECMO Lot Numbers  CardioHelp Console  [id number]  Oxygenator  [id number]  Tubing Pack  [id number]  ECMO Goals  Flow goal    4-4.5  Anticoagulation goal   N/a   Cardiac goal    >65  Respiratory goal   O2 > 88   Other goal       ECMO Handoff  Patient Information * Age Height Weight BSA IBW BMI  57 y.o. 5\' 6"  (167.6 cm)  (90.7 kg Body surface area is 2.06 meters squared. No data recorded Body mass index is 32.28 kg/m.   Review History * Primary Diagnosis   S/P AVR (aortic valve replacement)  Prior Cardiac Arrest within 24hrs of ECMO initiation?   ECMO and MCS * Type ECMO Flow ECMO Sweep Gases   ECMO Device: Cardiohelp   Flow (LPM): 3.63   Sweep Gas (LPM): 3     Additional Mechanical Support   Ventilation *    $ Ventilator Initial/Subsequent : Initial, Vent Mode: PRVC; SIMV; PSV, Vt Set: 510 mL, Set Rate: 16 bmp, FiO2 (%): 50 %, I Time: 0.9 Sec(s), PEEP: 5 cmH20     Cannula Size and Locations       Drainage  RA 36/46Fr   Return Ao 20Fr    *Cannula(e) sutured and anchored, secured and dressed.   Labs and Imaging *  *Cannulation position verified via imaging on arrival to ICU. Concerns communicated to attending surgeon. Labs reviewed.   All ECMO safety checks complete. ECMO flowsheet initiated, applicable charges captured, LDA's entered/confirmed, imaging and labs verified, blood products available, and report given to Caralyn Guile RN.

## 2023-04-26 NOTE — Progress Notes (Signed)
1727 patient went into torsades. Dr. Leafy Ro and Dr. Cliffton Asters at bedside. Decision made to perform resternotomy at bedside after one shock at 200J by Dr. Chestine Spore. MTP initiated, patient taken to OR.

## 2023-04-26 NOTE — Interval H&P Note (Signed)
History and Physical Interval Note:  04/26/2023 6:34 AM  Johnathan Anderson  has presented today for surgery, with the diagnosis of SEVERE AS TAA.  The various methods of treatment have been discussed with the patient and family. After consideration of risks, benefits and other options for treatment, the patient has consented to  Procedure(s): AORTIC VALVE REPLACEMENT (AVR) (N/A) REPLACEMENT ASCENDING AORTA (N/A) TRANSESOPHAGEAL ECHOCARDIOGRAM (N/A) as a surgical intervention.  The patient's history has been reviewed, patient examined, no change in status, stable for surgery.  I have reviewed the patient's chart and labs.  Questions were answered to the patient's satisfaction.     Eugenio Hoes

## 2023-04-26 NOTE — Consult Note (Signed)
Advanced Heart Failure Team Consult Note   Primary Physician: Irven Coe, MD PCP-Cardiologist:  Christell Constant, MD  Reason for Consultation: ECMO  HPI:    Johnathan Anderson is a 57 y.o. male with a PMH of bicuspid aortic valve, moderate ascending aortic aneusrym who is seen today for evaluation of cardiogenic shock at the request of CT surgery.   Patient presented for routine aortic root and valve repair. His surgery was complicated by concern for coronary ischemia and given difficulty coming off pump his right and left coronary arteries were bypassed. His RV was noted to be sluggish post op by anesthesia. After returning to the ICU he was coagulopathic and developed monomorphic VT. He underwent defibrillation and given his ongoing bleeding his chest was opened and the decision was made to return to the OR. No tamponade was noted, but right heart was distended with no movement, decision was made to place patient on central ECMO. Cannulation went well, patient noted to be preload sensitive.     Objective:    Vital Signs:   Temp:  [94.5 F (34.7 C)-97.7 F (36.5 C)] 95.2 F (35.1 C) (11/20 1745) Pulse Rate:  [54-112] 89 (11/20 1745) Resp:  [0-53] 31 (11/20 1745) BP: (78-149)/(49-99) 78/49 (11/20 1724) SpO2:  [88 %-100 %] 94 % (11/20 1745) Arterial Line BP: (36-231)/(21-130) 56/41 (11/20 1745) FiO2 (%):  [50 %] 50 % (11/20 1603) Weight:  [90.7 kg] 90.7 kg (11/20 0644)    Weight change: Filed Weights   04/26/23 0644  Weight: 90.7 kg    Intake/Output:   Intake/Output Summary (Last 24 hours) at 04/26/2023 2206 Last data filed at 04/26/2023 2116 Gross per 24 hour  Intake 12384.39 ml  Output 40981 ml  Net 1759.39 ml      Physical Exam    General: Sedated HEENT: NCAT Cor:  Paced, open chest with dressing in place, chest tubes and central ecmo cannulas in place Lungs: Ventilated Abdomen: soft, nontender, nondistended Extremities: no cyanosis, clubbing,  rash Neuro:sedated Vascular: absent pulses   Telemetry   Paced rhythm at 80  Labs and medications reviewed in Epic.  Patient Profile   Johnathan Anderson is a 57 y.o. male with a past medical history of bicuspid aortic valve and moderate aortic root dilation who underwent graft and valve replacement. Complicated by biventricular shock (RV predominant) requiring central VA ecmo cannulation.   Assessment/Plan   Cardiogenic shock: TEE images reviewed, patient with RV standstill, likely due to ischemia and prolonged pump time. S/p VA ecmo cannulation, currently requiring significant fluid resuscitation but no apparent active bleeding. Currently no requiring inotropes/vasopressors. - Norepinephrine as needed for MAP >70 - Flows 4-4.5L - Hemoglobin goal 8 - CVP currently low, hold on diuresis, renal function appears preserved - Initial lactic acid 9.7 - Continue supportive care - Prophylactic antibiotics while with open sternum - Hold on anticoagulation, discuss in AM  Length of Stay: 0  Romie Minus, MD  04/26/2023, 10:06 PM  Advanced Heart Failure Team Pager [phone] (M-F; 7a - 5p)  Please contact [facility] for night-coverage after hours (4p -7a ) and weekends on [email or web address]  CRITICAL CARE Performed by: Romie Minus   Total critical care time: 200 minutes  Critical care time was exclusive of separately billable procedures and treating other patients.  Critical care was necessary to treat or prevent imminent or life-threatening deterioration.  Critical care was time spent personally by me on the following activities: development of treatment plan with patient and/or  surrogate as well as nursing, discussions with consultants, evaluation of patient's response to treatment, examination of patient, obtaining history from patient or surrogate, ordering and performing treatments and interventions, ordering and review of laboratory studies, ordering and review of  radiographic studies, pulse oximetry and re-evaluation of patient's condition.

## 2023-04-26 NOTE — Progress Notes (Signed)
Pharmacy Antibiotic Note  Johnathan Anderson is a 57 y.o. male s/p bental procedure and bioprosthetic AVR with cardiogenic shock post procedure now on ECMO .  Pharmacy has been consulted for vancomycin dosing due to open sternum.  -SCr= 1.0 -vancomycin 1500mg  IV given ~ 9am  Plan: -d/c ancef -Start ceftriaxone 2gmIV q24h -Vancomycin 1750 mg IV now then 1250mg  IV q12h -Will follow renal function, vancomycin levels and clinical progress    Height: 5\' 6"  (167.6 cm) Weight: 90.7 kg (200 lb) IBW/kg (Calculated) : 63.8  Temp (24hrs), Avg:95.7 F (35.4 C), Min:94.5 F (34.7 C), Max:97.7 F (36.5 C)  Recent Labs  Lab 04/24/23 1057 04/26/23 0900 04/26/23 1524 04/26/23 1611 04/26/23 1809 04/26/23 1837 04/26/23 1851 04/26/23 1942 04/26/23 2011 04/26/23 2104 04/26/23 2115  WBC 6.0  --   --  19.1*  --  7.6  --   --   --  12.0*  --   CREATININE 1.04   < > 1.20  --  1.10  --  1.10 1.00 1.00  --   --   LATICACIDVEN  --   --   --   --   --   --   --   --   --   --  9.7*   < > = values in this interval not displayed.    Estimated Creatinine Clearance: 83.9 mL/min (by C-G formula based on SCr of 1 mg/dL).    Allergies  Allergen Reactions   Levaquin [Levofloxacin]     unknown    Antimicrobials this admission: 11/20 rocephin 11/20 vancomycin  Dose adjustments this admission:   Microbiology results:   Thank you for allowing pharmacy to be a part of this patient's care.  Harland German, PharmD Clinical Pharmacist **Pharmacist phone directory can now be found on [email or web address] ([email or web address]).  Listed under [facility].

## 2023-04-26 NOTE — Progress Notes (Signed)
  Discharge Summary completed by the Critical Care team at time of transfer.  Gaynelle Arabian, PA-C

## 2023-04-26 NOTE — Progress Notes (Signed)
High chest tube output, Dr. Leafy Ro back at bedside.  Went into Monomorphic VT. Shocked x 1 at 200J, back into paced rhythm. Very hypotensive with SBP in 30s, chest compressions started by Dr. Leafy Ro and chest opened.   Wife updated. Anesthesia at bedside to go back to OR.   Steffanie Dunn, DO 04/26/23 5:44 PM [facility]  For contact information, see Amion. If no response to pager, please call PCCM consult pager. After hours, 7PM- 7AM, please call Elink.

## 2023-04-26 NOTE — Anesthesia Procedure Notes (Signed)
Central Venous Catheter Insertion Performed by: [place], MD, anesthesiologist Start/End11/20/2024 7:45 AM, 04/26/2023 7:55 AM Patient location: Pre-op. Preanesthetic checklist: patient identified, IV checked, site marked, risks and benefits discussed, surgical consent, monitors and equipment checked, pre-op evaluation, timeout performed and anesthesia consent Lidocaine 1% used for infiltration and patient sedated Hand hygiene performed  and maximum sterile barriers used  Catheter size: 8 Fr Total catheter length 16. Central line was placed.Double lumen Procedure performed using ultrasound guided technique. Ultrasound Notes:image(s) printed for medical record Attempts: 1 Following insertion, dressing applied and line sutured. Post procedure assessment: blood return through all ports  Patient tolerated the procedure well with no immediate complications.

## 2023-04-26 NOTE — Anesthesia Procedure Notes (Signed)
Procedure Name: Intubation Date/Time: 04/26/2023 8:57 AM  Performed by: Yolonda Kida, CRNAPre-anesthesia Checklist: Patient identified, Emergency Drugs available, Suction available and Patient being monitored Patient Re-evaluated:Patient Re-evaluated prior to induction Oxygen Delivery Method: Circle System Utilized Preoxygenation: Pre-oxygenation with 100% oxygen Induction Type: IV induction Ventilation: Mask ventilation without difficulty, Oral airway inserted - appropriate to patient size, Nasal airway inserted- appropriate to patient size and Two handed mask ventilation required Laryngoscope Size: Mac and 4 Grade View: Grade I Tube type: Oral Tube size: 8.0 mm Number of attempts: 1 Airway Equipment and Method: Stylet and Oral airway Placement Confirmation: ETT inserted through vocal cords under direct vision, positive ETCO2 and breath sounds checked- equal and bilateral Secured at: 22 cm Tube secured with: Tape Dental Injury: Teeth and Oropharynx as per pre-operative assessment

## 2023-04-26 NOTE — Op Note (Signed)
CARDIOVASCULAR SURGERY OPERATIVE NOTE  04/26/2023 Johnathan Anderson [id number]  Surgeon:  Ashley Akin, MD  First Assistant: Brynda Greathouse MD                           [facility]                               An experienced assistant was required given the complexity of this surgery and the standard of surgical care. The assistant was needed for exposure, dissection, suctioning, retraction of delicate tissues and sutures, instrument exchange and for overall help during this procedure.     Preoperative Diagnosis:  Cardiogenic Shock following Aortic Root Replacement  Postoperative Diagnosis:  Same   Procedure: Open Sternotomy in ICU with transfer to OR for Cardiopulmonary bypass and placement of ECMO  Anesthesia:  General Endotracheal   Clinical History/Surgical Indication:  Pt SP aortic root replacement with immediate right and left heart dysfunction improved with bypass to his RCA and LAD. Pt in ICU with post operative hemorrhage and upon fluid rescusitation there was ventricular torrsades and need for ICU resternotomy with rescusitation  Findings:  In ICU upon resternotomy no tamponade however Right heart completely distended with little contraction. In OR pt on CPB without bleeding site identified and all suture lines intact. Pt required ECMO for ongoing Right ventricular failure  Operation: In ICU with CPR in progress the chest was prepped rapidly and resternotomy performed. The RV was completely hypokinetic and no appreciable blood clot in mediastinum. Pt transported immediately to OR and again quickly prepped and draped. The sternal retractor placed and the pericardial well developed. Pt was heparinized and the aorta cannulated with a 20 F sarns cannula and the right atrium cannulated with a two stage cannula and bypass immediately instituted. The mediastinum was extensively examined for bleeding sites and all suture lines were hemostatic on full anticoagulation. Decision  to convert to ECMO circulation was made and the ECMO circuit was prepped and utilzing the same cannulation the pt was converted to the ECMO circuit without issues. Adequate flows were achieved and protamine delivered. Pt had hemostasis achieved and the sternum stented with a modified 60cc syringe. Antibiotic kerlex was placed on the surface of the mediastinum and iodine biodrapes applied. Pt was taken to ICU in critical condition.

## 2023-04-26 NOTE — Hospital Course (Addendum)
Referring Cardiologist:  Christell Constant PCP:  Irven Coe, MD  History of Present Illness:     Johnathan Anderson is a pleasant 57 yo male who has been followed for progressive bicuspid aortic stenosis. Pt has had a recent echo with a mean gradient of and with normal LV function. Pt has not had any symptoms of exertional CP, SOB, lightheadedness or paplitations. He has positional discomfort when lying of left side but has frozen shoulder and feels hard to determine the source. He has also been followed for a moderate ascending aortic aneurysm which has been measured at 4.6 cm on a CTA from this January and on recent echo also estimated at 4.5cm.  He has had a cath without CAD or PHTN.His brother died suddenly from some heart related issue at age 87 but has no autopsy to confirm cause. Wife is concerned that he take care of this issue now.  Johnathan Anderson now has now severe bicuspid aortic stenosis with normal LV function and without CAD or PHTN. Pt with NYHA class 0 symptoms. Pt feels concerned with waiting especially knowing that this will progress over time and he can wait till symptoms start, but overall feels he would like to it get out of the way now. We discussed all the risks and goals of surgery (including ascending aortic replacement) and options for mechanical vs tissue valve and he wishes to have a tissue valve replacement to avoid Coumadin. We discussed recovery and they wish to have near the Thanksgiving holiday for his wife to have off from work form school teaching to be home with him.   Hospital Course: Johnathan Anderson was admitted for elective surgery on 04/26/23.  He was prepared and taken to the OR where the aortic valve, root, and ascending aorta were replaced using  23mm Resilia Konect bioprosthetic valved conduit. He had difficulty coming off the pump so a CABG x 2 utilizing SVG to RCA and LAD with open harvest of bilateral thighs was also performed. Following the procedure he was  transferred to the surgical ICU. He developed high chest tube output and went into ventricular tachycardia, he was defibrillated once with return to a paced rhythm. He developed severe hypotension and open sternotomy by Dr. Leafy Ro was performed at the SICU bedside. He was taken back to the operating room on 11/20 and underwent placement of ECMO for right ventricular failure. He tolerated the procedure and was transferred back to the SICU. The patient remained on ECMO with flows above 4L/min and required levophed and epinephrine for further blood pressure support. He was coagulopathic and would be brought back to the operating room on 11/21 for mediastinal washout. He tolerated the procedure and was transferred back to the SICU. He devloped worsening metabolic acidosis, CT brain was without abnormality and CT abdomen pelvis ruled our acute mesenteric ischemia. Acidosis resolved once the epinephrine drip was discontinued and he began to stabilize on ECMO. The patient developed agitation, sedation was increased. Tube feeds were started. The patient developed significant expected postoperative acute blood loss anemia and was transfused with 1U PRBCs. Diuresis was started. EEG showed diffuse encephalopathy but no seizure related activity.

## 2023-04-26 NOTE — OR Nursing (Signed)
Patient was centrally cannulated for ECMO and there are 4 Rubber Buttons and 4 Red Rubber Keepers intentionally left in place in the chest to hold in ECMO Cannulas.

## 2023-04-27 ENCOUNTER — Inpatient Hospital Stay (HOSPITAL_COMMUNITY): Payer: Commercial Managed Care - HMO

## 2023-04-27 ENCOUNTER — Encounter (HOSPITAL_COMMUNITY): Payer: Self-pay | Admitting: Thoracic Surgery (Cardiothoracic Vascular Surgery)

## 2023-04-27 ENCOUNTER — Encounter (HOSPITAL_COMMUNITY)
Admission: RE | Disposition: A | Discharge status: Hospice | Payer: Self-pay | Source: Home / Self Care | Attending: Thoracic Surgery (Cardiothoracic Vascular Surgery)

## 2023-04-27 DIAGNOSIS — I97638 Postprocedural hematoma of a circulatory system organ or structure following other circulatory system procedure: Secondary | ICD-10-CM

## 2023-04-27 DIAGNOSIS — J9601 Acute respiratory failure with hypoxia: Secondary | ICD-10-CM

## 2023-04-27 DIAGNOSIS — R570 Cardiogenic shock: Secondary | ICD-10-CM

## 2023-04-27 DIAGNOSIS — K219 Gastro-esophageal reflux disease without esophagitis: Secondary | ICD-10-CM

## 2023-04-27 DIAGNOSIS — I472 Ventricular tachycardia, unspecified: Secondary | ICD-10-CM | POA: Diagnosis not present

## 2023-04-27 DIAGNOSIS — I97618 Postprocedural hemorrhage and hematoma of a circulatory system organ or structure following other circulatory system procedure: Secondary | ICD-10-CM

## 2023-04-27 DIAGNOSIS — I10 Essential (primary) hypertension: Secondary | ICD-10-CM

## 2023-04-27 DIAGNOSIS — E870 Hyperosmolality and hypernatremia: Secondary | ICD-10-CM

## 2023-04-27 HISTORY — PX: EXPLORATION POST OPERATIVE OPEN HEART: SHX5061

## 2023-04-27 HISTORY — PX: TEE WITHOUT CARDIOVERSION: SHX5443

## 2023-04-27 LAB — POCT I-STAT 7, (LYTES, BLD GAS, ICA,H+H)
Acid-Base Excess: 2 mmol/L (ref 0.0–2.0)
Acid-Base Excess: 1 mmol/L (ref 0.0–2.0)
Acid-Base Excess: 0 mmol/L (ref 0.0–2.0)
Acid-Base Excess: 1 mmol/L (ref 0.0–2.0)
Acid-Base Excess: 5 mmol/L — ABNORMAL HIGH (ref 0.0–2.0)
Acid-Base Excess: 7 mmol/L — ABNORMAL HIGH (ref 0.0–2.0)
Acid-Base Excess: 8 mmol/L — ABNORMAL HIGH (ref 0.0–2.0)
Acid-Base Excess: 9 mmol/L — ABNORMAL HIGH (ref 0.0–2.0)
Acid-base deficit: 1 mmol/L (ref 0.0–2.0)
Acid-base deficit: 1 mmol/L (ref 0.0–2.0)
Acid-base deficit: 3 mmol/L — ABNORMAL HIGH (ref 0.0–2.0)
Acid-base deficit: 4 mmol/L — ABNORMAL HIGH (ref 0.0–2.0)
Acid-base deficit: 11 mmol/L — ABNORMAL HIGH (ref 0.0–2.0)
Acid-base deficit: 11 mmol/L — ABNORMAL HIGH (ref 0.0–2.0)
Acid-base deficit: 6 mmol/L — ABNORMAL HIGH (ref 0.0–2.0)
Bicarbonate: 28 mmol/L (ref 20.0–28.0)
Bicarbonate: 27.7 mmol/L (ref 20.0–28.0)
Bicarbonate: 23.7 mmol/L (ref 20.0–28.0)
Bicarbonate: 25.7 mmol/L (ref 20.0–28.0)
Bicarbonate: 26.4 mmol/L (ref 20.0–28.0)
Bicarbonate: 26.9 mmol/L (ref 20.0–28.0)
Bicarbonate: 19.5 mmol/L — ABNORMAL LOW (ref 20.0–28.0)
Bicarbonate: 13.2 mmol/L — ABNORMAL LOW (ref 20.0–28.0)
Bicarbonate: 14.1 mmol/L — ABNORMAL LOW (ref 20.0–28.0)
Bicarbonate: 17.3 mmol/L — ABNORMAL LOW (ref 20.0–28.0)
Bicarbonate: 24 mmol/L (ref 20.0–28.0)
Bicarbonate: 28.3 mmol/L — ABNORMAL HIGH (ref 20.0–28.0)
Bicarbonate: 30.6 mmol/L — ABNORMAL HIGH (ref 20.0–28.0)
Bicarbonate: 32.5 mmol/L — ABNORMAL HIGH (ref 20.0–28.0)
Bicarbonate: 33.1 mmol/L — ABNORMAL HIGH (ref 20.0–28.0)
Calcium, Ion: 1.18 mmol/L (ref 1.15–1.40)
Calcium, Ion: 1.13 mmol/L — ABNORMAL LOW (ref 1.15–1.40)
Calcium, Ion: 1.1 mmol/L — ABNORMAL LOW (ref 1.15–1.40)
Calcium, Ion: 1.11 mmol/L — ABNORMAL LOW (ref 1.15–1.40)
Calcium, Ion: 1.12 mmol/L — ABNORMAL LOW (ref 1.15–1.40)
Calcium, Ion: 1.13 mmol/L — ABNORMAL LOW (ref 1.15–1.40)
Calcium, Ion: 0.97 mmol/L — ABNORMAL LOW (ref 1.15–1.40)
Calcium, Ion: 1 mmol/L — ABNORMAL LOW (ref 1.15–1.40)
Calcium, Ion: 1.06 mmol/L — ABNORMAL LOW (ref 1.15–1.40)
Calcium, Ion: 1.04 mmol/L — ABNORMAL LOW (ref 1.15–1.40)
Calcium, Ion: 1.03 mmol/L — ABNORMAL LOW (ref 1.15–1.40)
Calcium, Ion: 1.08 mmol/L — ABNORMAL LOW (ref 1.15–1.40)
Calcium, Ion: 1.1 mmol/L — ABNORMAL LOW (ref 1.15–1.40)
Calcium, Ion: 1.12 mmol/L — ABNORMAL LOW (ref 1.15–1.40)
Calcium, Ion: 1.12 mmol/L — ABNORMAL LOW (ref 1.15–1.40)
HCT: 28 % — ABNORMAL LOW (ref 39.0–52.0)
HCT: 25 % — ABNORMAL LOW (ref 39.0–52.0)
HCT: 23 % — ABNORMAL LOW (ref 39.0–52.0)
HCT: 23 % — ABNORMAL LOW (ref 39.0–52.0)
HCT: 25 % — ABNORMAL LOW (ref 39.0–52.0)
HCT: 26 % — ABNORMAL LOW (ref 39.0–52.0)
HCT: 23 % — ABNORMAL LOW (ref 39.0–52.0)
HCT: 24 % — ABNORMAL LOW (ref 39.0–52.0)
HCT: 25 % — ABNORMAL LOW (ref 39.0–52.0)
HCT: 22 % — ABNORMAL LOW (ref 39.0–52.0)
HCT: 21 % — ABNORMAL LOW (ref 39.0–52.0)
HCT: 17 % — ABNORMAL LOW (ref 39.0–52.0)
HCT: 20 % — ABNORMAL LOW (ref 39.0–52.0)
HCT: 20 % — ABNORMAL LOW (ref 39.0–52.0)
HCT: 26 % — ABNORMAL LOW (ref 39.0–52.0)
Hemoglobin: 9.5 g/dL — ABNORMAL LOW (ref 13.0–17.0)
Hemoglobin: 8.5 g/dL — ABNORMAL LOW (ref 13.0–17.0)
Hemoglobin: 7.8 g/dL — ABNORMAL LOW (ref 13.0–17.0)
Hemoglobin: 7.8 g/dL — ABNORMAL LOW (ref 13.0–17.0)
Hemoglobin: 8.5 g/dL — ABNORMAL LOW (ref 13.0–17.0)
Hemoglobin: 8.8 g/dL — ABNORMAL LOW (ref 13.0–17.0)
Hemoglobin: 7.8 g/dL — ABNORMAL LOW (ref 13.0–17.0)
Hemoglobin: 8.2 g/dL — ABNORMAL LOW (ref 13.0–17.0)
Hemoglobin: 8.5 g/dL — ABNORMAL LOW (ref 13.0–17.0)
Hemoglobin: 7.5 g/dL — ABNORMAL LOW (ref 13.0–17.0)
Hemoglobin: 7.1 g/dL — ABNORMAL LOW (ref 13.0–17.0)
Hemoglobin: 5.8 g/dL — CL (ref 13.0–17.0)
Hemoglobin: 6.8 g/dL — CL (ref 13.0–17.0)
Hemoglobin: 6.8 g/dL — CL (ref 13.0–17.0)
Hemoglobin: 8.8 g/dL — ABNORMAL LOW (ref 13.0–17.0)
O2 Saturation: 100 %
O2 Saturation: 98 %
O2 Saturation: 94 %
O2 Saturation: 95 %
O2 Saturation: 99 %
O2 Saturation: 99 %
O2 Saturation: 100 %
O2 Saturation: 100 %
O2 Saturation: 100 %
O2 Saturation: 100 %
O2 Saturation: 100 %
O2 Saturation: 99 %
O2 Saturation: 99 %
O2 Saturation: 98 %
O2 Saturation: 98 %
Patient temperature: 36.2
Patient temperature: 36.4
Patient temperature: 36.5
Patient temperature: 36.5
Patient temperature: 36.6
Patient temperature: 36.6
Patient temperature: 36
Patient temperature: 36.2
Patient temperature: 36.3
Patient temperature: 36.7
Patient temperature: 36.6
Patient temperature: 36.7
Patient temperature: 36.8
Patient temperature: 36.8
Potassium: 4 mmol/L (ref 3.5–5.1)
Potassium: 4.5 mmol/L (ref 3.5–5.1)
Potassium: 3.7 mmol/L (ref 3.5–5.1)
Potassium: 3.8 mmol/L (ref 3.5–5.1)
Potassium: 3.8 mmol/L (ref 3.5–5.1)
Potassium: 3.9 mmol/L (ref 3.5–5.1)
Potassium: 3.5 mmol/L (ref 3.5–5.1)
Potassium: 3.1 mmol/L — ABNORMAL LOW (ref 3.5–5.1)
Potassium: 3.3 mmol/L — ABNORMAL LOW (ref 3.5–5.1)
Potassium: 3.3 mmol/L — ABNORMAL LOW (ref 3.5–5.1)
Potassium: 3.6 mmol/L (ref 3.5–5.1)
Potassium: 3.1 mmol/L — ABNORMAL LOW (ref 3.5–5.1)
Potassium: 3.3 mmol/L — ABNORMAL LOW (ref 3.5–5.1)
Potassium: 3.2 mmol/L — ABNORMAL LOW (ref 3.5–5.1)
Potassium: 3.2 mmol/L — ABNORMAL LOW (ref 3.5–5.1)
Sodium: 151 mmol/L — ABNORMAL HIGH (ref 135–145)
Sodium: 150 mmol/L — ABNORMAL HIGH (ref 135–145)
Sodium: 152 mmol/L — ABNORMAL HIGH (ref 135–145)
Sodium: 152 mmol/L — ABNORMAL HIGH (ref 135–145)
Sodium: 152 mmol/L — ABNORMAL HIGH (ref 135–145)
Sodium: 152 mmol/L — ABNORMAL HIGH (ref 135–145)
Sodium: 152 mmol/L — ABNORMAL HIGH (ref 135–145)
Sodium: 150 mmol/L — ABNORMAL HIGH (ref 135–145)
Sodium: 151 mmol/L — ABNORMAL HIGH (ref 135–145)
Sodium: 152 mmol/L — ABNORMAL HIGH (ref 135–145)
Sodium: 154 mmol/L — ABNORMAL HIGH (ref 135–145)
Sodium: 154 mmol/L — ABNORMAL HIGH (ref 135–145)
Sodium: 154 mmol/L — ABNORMAL HIGH (ref 135–145)
Sodium: 155 mmol/L — ABNORMAL HIGH (ref 135–145)
Sodium: 155 mmol/L — ABNORMAL HIGH (ref 135–145)
TCO2: 29 mmol/L (ref 22–32)
TCO2: 29 mmol/L (ref 22–32)
TCO2: 25 mmol/L (ref 22–32)
TCO2: 27 mmol/L (ref 22–32)
TCO2: 28 mmol/L (ref 22–32)
TCO2: 29 mmol/L (ref 22–32)
TCO2: 20 mmol/L — ABNORMAL LOW (ref 22–32)
TCO2: 14 mmol/L — ABNORMAL LOW (ref 22–32)
TCO2: 15 mmol/L — ABNORMAL LOW (ref 22–32)
TCO2: 18 mmol/L — ABNORMAL LOW (ref 22–32)
TCO2: 25 mmol/L (ref 22–32)
TCO2: 29 mmol/L (ref 22–32)
TCO2: 32 mmol/L (ref 22–32)
TCO2: 34 mmol/L — ABNORMAL HIGH (ref 22–32)
TCO2: 34 mmol/L — ABNORMAL HIGH (ref 22–32)
pCO2 arterial: 46.5 mm[Hg] (ref 32–48)
pCO2 arterial: 50.1 mm[Hg] — ABNORMAL HIGH (ref 32–48)
pCO2 arterial: 47 mm[Hg] (ref 32–48)
pCO2 arterial: 52.2 mm[Hg] — ABNORMAL HIGH (ref 32–48)
pCO2 arterial: 55.2 mm[Hg] — ABNORMAL HIGH (ref 32–48)
pCO2 arterial: 56.4 mm[Hg] — ABNORMAL HIGH (ref 32–48)
pCO2 arterial: 30.2 mm[Hg] — ABNORMAL LOW (ref 32–48)
pCO2 arterial: 22.8 mm[Hg] — ABNORMAL LOW (ref 32–48)
pCO2 arterial: 27.1 mm[Hg] — ABNORMAL LOW (ref 32–48)
pCO2 arterial: 25.2 mm[Hg] — ABNORMAL LOW (ref 32–48)
pCO2 arterial: 27.5 mm[Hg] — ABNORMAL LOW (ref 32–48)
pCO2 arterial: 33.3 mm[Hg] (ref 32–48)
pCO2 arterial: 38.8 mm[Hg] (ref 32–48)
pCO2 arterial: 42.9 mm[Hg] (ref 32–48)
pCO2 arterial: 44 mm[Hg] (ref 32–48)
pH, Arterial: 7.385 (ref 7.35–7.45)
pH, Arterial: 7.347 — ABNORMAL LOW (ref 7.35–7.45)
pH, Arterial: 7.283 — ABNORMAL LOW (ref 7.35–7.45)
pH, Arterial: 7.285 — ABNORMAL LOW (ref 7.35–7.45)
pH, Arterial: 7.298 — ABNORMAL LOW (ref 7.35–7.45)
pH, Arterial: 7.308 — ABNORMAL LOW (ref 7.35–7.45)
pH, Arterial: 7.419 (ref 7.35–7.45)
pH, Arterial: 7.321 — ABNORMAL LOW (ref 7.35–7.45)
pH, Arterial: 7.368 (ref 7.35–7.45)
pH, Arterial: 7.44 (ref 7.35–7.45)
pH, Arterial: 7.547 — ABNORMAL HIGH (ref 7.35–7.45)
pH, Arterial: 7.503 — ABNORMAL HIGH (ref 7.35–7.45)
pH, Arterial: 7.536 — ABNORMAL HIGH (ref 7.35–7.45)
pH, Arterial: 7.484 — ABNORMAL HIGH (ref 7.35–7.45)
pH, Arterial: 7.486 — ABNORMAL HIGH (ref 7.35–7.45)
pO2, Arterial: 278 mm[Hg] — ABNORMAL HIGH (ref 83–108)
pO2, Arterial: 105 mm[Hg] (ref 83–108)
pO2, Arterial: 164 mm[Hg] — ABNORMAL HIGH (ref 83–108)
pO2, Arterial: 182 mm[Hg] — ABNORMAL HIGH (ref 83–108)
pO2, Arterial: 77 mm[Hg] — ABNORMAL LOW (ref 83–108)
pO2, Arterial: 82 mm[Hg] — ABNORMAL LOW (ref 83–108)
pO2, Arterial: 362 mm[Hg] — ABNORMAL HIGH (ref 83–108)
pO2, Arterial: 199 mm[Hg] — ABNORMAL HIGH (ref 83–108)
pO2, Arterial: 254 mm[Hg] — ABNORMAL HIGH (ref 83–108)
pO2, Arterial: 204 mm[Hg] — ABNORMAL HIGH (ref 83–108)
pO2, Arterial: 154 mm[Hg] — ABNORMAL HIGH (ref 83–108)
pO2, Arterial: 134 mm[Hg] — ABNORMAL HIGH (ref 83–108)
pO2, Arterial: 142 mm[Hg] — ABNORMAL HIGH (ref 83–108)
pO2, Arterial: 93 mm[Hg] (ref 83–108)
pO2, Arterial: 93 mm[Hg] (ref 83–108)

## 2023-04-27 LAB — CBC
HCT: 30.4 % — ABNORMAL LOW (ref 39.0–52.0)
HCT: 25.9 % — ABNORMAL LOW (ref 39.0–52.0)
HCT: 20 % — ABNORMAL LOW (ref 39.0–52.0)
HCT: 22.5 % — ABNORMAL LOW (ref 39.0–52.0)
Hemoglobin: 10.8 g/dL — ABNORMAL LOW (ref 13.0–17.0)
Hemoglobin: 9.1 g/dL — ABNORMAL LOW (ref 13.0–17.0)
Hemoglobin: 7.2 g/dL — ABNORMAL LOW (ref 13.0–17.0)
Hemoglobin: 8 g/dL — ABNORMAL LOW (ref 13.0–17.0)
MCH: 28.8 pg (ref 26.0–34.0)
MCH: 29.4 pg (ref 26.0–34.0)
MCH: 29.5 pg (ref 26.0–34.0)
MCH: 29.1 pg (ref 26.0–34.0)
MCHC: 35.5 g/dL (ref 30.0–36.0)
MCHC: 35.1 g/dL (ref 30.0–36.0)
MCHC: 36 g/dL (ref 30.0–36.0)
MCHC: 35.6 g/dL (ref 30.0–36.0)
MCV: 81.1 fL (ref 80.0–100.0)
MCV: 83.5 fL (ref 80.0–100.0)
MCV: 82 fL (ref 80.0–100.0)
MCV: 81.8 fL (ref 80.0–100.0)
Platelets: 23 10*3/uL — CL (ref 150–400)
Platelets: 99 10*3/uL — ABNORMAL LOW (ref 150–400)
Platelets: 102 10*3/uL — ABNORMAL LOW (ref 150–400)
Platelets: 101 10*3/uL — ABNORMAL LOW (ref 150–400)
RBC: 3.75 MIL/uL — ABNORMAL LOW (ref 4.22–5.81)
RBC: 3.1 MIL/uL — ABNORMAL LOW (ref 4.22–5.81)
RBC: 2.44 MIL/uL — ABNORMAL LOW (ref 4.22–5.81)
RBC: 2.75 MIL/uL — ABNORMAL LOW (ref 4.22–5.81)
RDW: 14.9 % (ref 11.5–15.5)
RDW: 15.8 % — ABNORMAL HIGH (ref 11.5–15.5)
RDW: 15.6 % — ABNORMAL HIGH (ref 11.5–15.5)
RDW: 15.2 % (ref 11.5–15.5)
WBC: 5.9 10*3/uL (ref 4.0–10.5)
WBC: 7.3 10*3/uL (ref 4.0–10.5)
WBC: 5.7 10*3/uL (ref 4.0–10.5)
WBC: 6.8 10*3/uL (ref 4.0–10.5)
nRBC: 0 % (ref 0.0–0.2)
nRBC: 0 % (ref 0.0–0.2)
nRBC: 0 % (ref 0.0–0.2)
nRBC: 0 % (ref 0.0–0.2)

## 2023-04-27 LAB — BPAM FFP
Blood Product Expiration Date: 202411252359
Blood Product Expiration Date: 202411252359
Blood Product Expiration Date: 202411252359
Blood Product Expiration Date: 202411252359
ISSUE DATE / TIME: 202411201446
ISSUE DATE / TIME: 202411201446
ISSUE DATE / TIME: 202411201625
ISSUE DATE / TIME: 202411201625
Unit Type and Rh: 6200
Unit Type and Rh: 6200
Unit Type and Rh: 6200
Unit Type and Rh: 6200

## 2023-04-27 LAB — GLOBAL TEG PANEL
CFF Max Amplitude: 5.2 mm — ABNORMAL LOW (ref 15–32)
CFF Max Amplitude: 17.4 mm (ref 15–32)
CK with Heparinase (R): 13.7 min — ABNORMAL HIGH (ref 4.3–8.3)
CK with Heparinase (R): 7.5 min (ref 4.3–8.3)
Citrated Functional Fibrinogen: 151.4 mg/dL — ABNORMAL LOW (ref 278–581)
Citrated Functional Fibrinogen: 317.5 mg/dL (ref 278–581)
Citrated Kaolin (K): >5 min — ABNORMAL HIGH (ref 0.8–2.1)
Citrated Kaolin (K): 1.8 min (ref 0.8–2.1)
Citrated Kaolin (MA): 42.1 mm — ABNORMAL LOW (ref 52–69)
Citrated Kaolin (MA): 53.9 mm (ref 52–69)
Citrated Kaolin (R): >17 min — ABNORMAL HIGH (ref 4.6–9.1)
Citrated Kaolin (R): 7.7 min (ref 4.6–9.1)
Citrated Kaolin Angle: 42.9 deg — ABNORMAL LOW (ref 63–78)
Citrated Kaolin Angle: 67.9 deg (ref 63–78)
Citrated Rapid TEG (MA): 41.9 mm — ABNORMAL LOW (ref 52–70)
Citrated Rapid TEG (MA): 53.3 mm (ref 52–70)

## 2023-04-27 LAB — HEPATIC FUNCTION PANEL
ALT: 65 U/L — ABNORMAL HIGH (ref 0–44)
AST: 393 U/L — ABNORMAL HIGH (ref 15–41)
Albumin: 2.8 g/dL — ABNORMAL LOW (ref 3.5–5.0)
Alkaline Phosphatase: 10 U/L — ABNORMAL LOW (ref 38–126)
Bilirubin, Direct: 1.1 mg/dL — ABNORMAL HIGH (ref 0.0–0.2)
Indirect Bilirubin: 1.4 mg/dL — ABNORMAL HIGH (ref 0.3–0.9)
Total Bilirubin: 2.5 mg/dL — ABNORMAL HIGH (ref <1.2)
Total Protein: 3.5 g/dL — ABNORMAL LOW (ref 6.5–8.1)

## 2023-04-27 LAB — POCT I-STAT, CHEM 8
BUN: 15 mg/dL (ref 6–20)
Calcium, Ion: 0.97 mmol/L — ABNORMAL LOW (ref 1.15–1.40)
Chloride: 107 mmol/L (ref 98–111)
Creatinine, Ser: 1.4 mg/dL — ABNORMAL HIGH (ref 0.61–1.24)
Glucose, Bld: 184 mg/dL — ABNORMAL HIGH (ref 70–99)
HCT: 23 % — ABNORMAL LOW (ref 39.0–52.0)
Hemoglobin: 7.8 g/dL — ABNORMAL LOW (ref 13.0–17.0)
Potassium: 3.5 mmol/L (ref 3.5–5.1)
Sodium: 152 mmol/L — ABNORMAL HIGH (ref 135–145)
TCO2: 22 mmol/L (ref 22–32)

## 2023-04-27 LAB — GLUCOSE, CAPILLARY
Glucose-Capillary: 128 mg/dL — ABNORMAL HIGH (ref 70–99)
Glucose-Capillary: 151 mg/dL — ABNORMAL HIGH (ref 70–99)
Glucose-Capillary: 193 mg/dL — ABNORMAL HIGH (ref 70–99)
Glucose-Capillary: 124 mg/dL — ABNORMAL HIGH (ref 70–99)
Glucose-Capillary: 117 mg/dL — ABNORMAL HIGH (ref 70–99)

## 2023-04-27 LAB — LACTIC ACID, PLASMA: Lactic Acid, Venous: 3.9 mmol/L (ref 0.5–1.9)

## 2023-04-27 LAB — BASIC METABOLIC PANEL
Anion gap: 7 (ref 5–15)
Anion gap: 16 — ABNORMAL HIGH (ref 5–15)
BUN: 13 mg/dL (ref 6–20)
BUN: 14 mg/dL (ref 6–20)
CO2: 27 mmol/L (ref 22–32)
CO2: 26 mmol/L (ref 22–32)
Calcium: 7.9 mg/dL — ABNORMAL LOW (ref 8.9–10.3)
Calcium: 7.9 mg/dL — ABNORMAL LOW (ref 8.9–10.3)
Chloride: 112 mmol/L — ABNORMAL HIGH (ref 98–111)
Chloride: 111 mmol/L (ref 98–111)
Creatinine, Ser: 1.37 mg/dL — ABNORMAL HIGH (ref 0.61–1.24)
Creatinine, Ser: 1.86 mg/dL — ABNORMAL HIGH (ref 0.61–1.24)
GFR, Estimated: 59 mL/min — ABNORMAL LOW (ref >60)
GFR, Estimated: 41 mL/min — ABNORMAL LOW (ref >60)
Glucose, Bld: 113 mg/dL — ABNORMAL HIGH (ref 70–99)
Glucose, Bld: 189 mg/dL — ABNORMAL HIGH (ref 70–99)
Potassium: 4 mmol/L (ref 3.5–5.1)
Potassium: 3.6 mmol/L (ref 3.5–5.1)
Sodium: 146 mmol/L — ABNORMAL HIGH (ref 135–145)
Sodium: 153 mmol/L — ABNORMAL HIGH (ref 135–145)

## 2023-04-27 LAB — ECHO INTRAOPERATIVE TEE
AR max vel: 1.59 cm2
AV Area VTI: 1.38 cm2
AV Area mean vel: 1.55 cm2
AV Mean grad: 12 mm[Hg]
AV Peak grad: 21.3 mm[Hg]
Ao pk vel: 2.31 m/s
Height: 66 in
Height: 66 in
Weight: 3200 [oz_av]
Weight: 3470.92 [oz_av]

## 2023-04-27 LAB — PREPARE PLATELET PHERESIS
Unit division: 0
Unit division: 0
Unit division: 0

## 2023-04-27 LAB — CG4 I-STAT (LACTIC ACID)
Lactic Acid, Venous: 2.7 mmol/L (ref 0.5–1.9)
Lactic Acid, Venous: 5.1 mmol/L (ref 0.5–1.9)
Lactic Acid, Venous: >15 mmol/L (ref 0.5–1.9)
Lactic Acid, Venous: >15 mmol/L (ref 0.5–1.9)
Lactic Acid, Venous: 10.7 mmol/L (ref 0.5–1.9)
Lactic Acid, Venous: 6.5 mmol/L (ref 0.5–1.9)
Lactic Acid, Venous: 3.5 mmol/L (ref 0.5–1.9)

## 2023-04-27 LAB — BPAM CRYOPRECIPITATE
Blood Product Expiration Date: 202411252359
Blood Product Expiration Date: 202411242359
ISSUE DATE / TIME: 202411201735
ISSUE DATE / TIME: 202411201646
Unit Type and Rh: 5100
Unit Type and Rh: 5100

## 2023-04-27 LAB — DIC (DISSEMINATED INTRAVASCULAR COAGULATION)PANEL
D-Dimer, Quant: 1.79 ug{FEU}/mL — ABNORMAL HIGH (ref 0.00–0.50)
D-Dimer, Quant: 0.6 ug{FEU}/mL — ABNORMAL HIGH (ref 0.00–0.50)
Fibrinogen: 136 mg/dL — ABNORMAL LOW (ref 210–475)
Fibrinogen: 243 mg/dL (ref 210–475)
INR: 2 — ABNORMAL HIGH (ref 0.8–1.2)
INR: 1.8 — ABNORMAL HIGH (ref 0.8–1.2)
Platelets: 104 10*3/uL — ABNORMAL LOW (ref 150–400)
Platelets: 100 10*3/uL — ABNORMAL LOW (ref 150–400)
Prothrombin Time: 23.2 s — ABNORMAL HIGH (ref 11.4–15.2)
Prothrombin Time: 21 s — ABNORMAL HIGH (ref 11.4–15.2)
Smear Review: NONE SEEN
Smear Review: NONE SEEN
aPTT: 93 s — ABNORMAL HIGH (ref 24–36)
aPTT: 42 s — ABNORMAL HIGH (ref 24–36)

## 2023-04-27 LAB — PREPARE FRESH FROZEN PLASMA
Unit division: 0
Unit division: 0

## 2023-04-27 LAB — BPAM PLATELET PHERESIS
Blood Product Expiration Date: 202411212359
Blood Product Expiration Date: 202411222359
Blood Product Expiration Date: 202411222359
ISSUE DATE / TIME: 202411201355
ISSUE DATE / TIME: 202411201625
ISSUE DATE / TIME: 202411201735
Unit Type and Rh: 6200
Unit Type and Rh: 600
Unit Type and Rh: 6200

## 2023-04-27 LAB — PREPARE CRYOPRECIPITATE
Unit division: 0
Unit division: 0

## 2023-04-27 LAB — MAGNESIUM
Magnesium: 1.8 mg/dL (ref 1.7–2.4)
Magnesium: 1.7 mg/dL (ref 1.7–2.4)

## 2023-04-27 LAB — PREPARE RBC (CROSSMATCH)

## 2023-04-27 LAB — LACTATE DEHYDROGENASE: LDH: 584 U/L — ABNORMAL HIGH (ref 98–192)

## 2023-04-27 LAB — PROTIME-INR
INR: 2.3 — ABNORMAL HIGH (ref 0.8–1.2)
Prothrombin Time: 25.4 s — ABNORMAL HIGH (ref 11.4–15.2)

## 2023-04-27 LAB — FIBRINOGEN: Fibrinogen: 101 mg/dL — ABNORMAL LOW (ref 210–475)

## 2023-04-27 LAB — SURGICAL PATHOLOGY

## 2023-04-27 SURGERY — EXPLORATION POST OPERATIVE OPEN HEART
Anesthesia: General | Site: Esophagus

## 2023-04-27 MED ORDER — VANCOMYCIN HCL 1250 MG/250ML IV SOLN: 1250.0000 mg | INTRAVENOUS | Status: DC

## 2023-04-27 MED ORDER — CEFAZOLIN SODIUM-DEXTROSE 2-4 GM/100ML-% IV SOLN: 2.0000 g | INTRAVENOUS | Status: DC

## 2023-04-27 MED ORDER — LACTATED RINGERS IV SOLN
INTRAVENOUS | Status: DC
Start: 2023-04-27 — End: 2023-04-27

## 2023-04-27 MED ORDER — VANCOMYCIN HCL 1000 MG IV SOLR
INTRAVENOUS | Status: DC | PRN
  Administered 2023-04-27: 1000 mL

## 2023-04-27 MED ORDER — CALCIUM CHLORIDE 10 % IV SOLN
INTRAVENOUS | Status: AC
  Filled 2023-04-27: qty 10

## 2023-04-27 MED ORDER — SODIUM BICARBONATE 8.4 % IV SOLN: 150.0000 meq | Freq: Once | INTRAVENOUS | Status: DC

## 2023-04-27 MED ORDER — POVIDONE-IODINE 10 % EX OINT
TOPICAL_OINTMENT | CUTANEOUS | Status: DC | PRN
  Administered 2023-04-27: 1 via TOPICAL

## 2023-04-27 MED ORDER — ROCURONIUM BROMIDE 10 MG/ML (PF) SYRINGE
PREFILLED_SYRINGE | INTRAVENOUS | Status: AC
  Administered 2023-04-27: 100 mg via INTRAVENOUS
  Filled 2023-04-27: qty 10

## 2023-04-27 MED ORDER — VANCOMYCIN HCL 1000 MG IV SOLR
INTRAVENOUS | Status: AC
  Filled 2023-04-27: qty 20

## 2023-04-27 MED ORDER — ORAL CARE MOUTH RINSE: 15.0000 mL | OROMUCOSAL | Status: DC | PRN

## 2023-04-27 MED ORDER — ROCURONIUM BROMIDE 10 MG/ML (PF) SYRINGE: 100.0000 mg | PREFILLED_SYRINGE | Freq: Once | INTRAVENOUS | Status: AC

## 2023-04-27 MED ORDER — SODIUM BICARBONATE 8.4 % IV SOLN
100.0000 meq | Freq: Once | INTRAVENOUS | Status: AC
Start: 2023-04-27 — End: 2023-04-27

## 2023-04-27 MED ORDER — CHLORHEXIDINE GLUCONATE CLOTH 2 % EX PADS
6.0000 | MEDICATED_PAD | Freq: Every day | CUTANEOUS | Status: DC
  Administered 2023-04-27 – 2023-05-01 (×5): 6 via TOPICAL

## 2023-04-27 MED ORDER — EPINEPHRINE 1 MG/10ML IJ SOSY
PREFILLED_SYRINGE | INTRAMUSCULAR | Status: AC
  Filled 2023-04-27: qty 20

## 2023-04-27 MED ORDER — CHLORHEXIDINE GLUCONATE CLOTH 2 % EX PADS: 6.0000 | MEDICATED_PAD | Freq: Every day | CUTANEOUS | Status: DC

## 2023-04-27 MED ORDER — SODIUM CHLORIDE 0.9 % IV SOLN: 250.0000 mL | INTRAVENOUS | Status: DC

## 2023-04-27 MED ORDER — SODIUM BICARBONATE 8.4 % IV SOLN
150.0000 meq | Freq: Once | INTRAVENOUS | Status: AC
Start: 2023-04-27 — End: 2023-04-27

## 2023-04-27 MED ORDER — CHLORHEXIDINE GLUCONATE 0.12 % MT SOLN
15.0000 mL | Freq: Once | OROMUCOSAL | Status: DC
  Administered 2023-04-27: 15 mL via OROMUCOSAL

## 2023-04-27 MED ORDER — CHLORHEXIDINE GLUCONATE CLOTH 2 % EX PADS: 6.0000 | MEDICATED_PAD | Freq: Once | CUTANEOUS | Status: DC

## 2023-04-27 MED ORDER — FREE WATER: 100.0000 mL | Status: DC

## 2023-04-27 MED ORDER — TEMAZEPAM 15 MG PO CAPS: 15.0000 mg | ORAL_CAPSULE | Freq: Once | ORAL | Status: DC | PRN

## 2023-04-27 MED ORDER — 0.9 % SODIUM CHLORIDE (POUR BTL) OPTIME
TOPICAL | Status: DC | PRN
  Administered 2023-04-27: 4000 mL

## 2023-04-27 MED ORDER — POTASSIUM CHLORIDE 20 MEQ PO PACK: 40.0000 meq | PACK | Freq: Once | ORAL | Status: DC

## 2023-04-27 MED ORDER — SODIUM CHLORIDE 0.9 % IV SOLN
1.0000 g | Freq: Three times a day (TID) | INTRAVENOUS | Status: DC
  Administered 2023-04-27 – 2023-05-01 (×14): 1 g via INTRAVENOUS
  Filled 2023-04-27 (×15): qty 20

## 2023-04-27 MED ORDER — MAGNESIUM SULFATE 4 GM/100ML IV SOLN
4.0000 g | Freq: Once | INTRAVENOUS | Status: AC
  Administered 2023-04-27: 4 g via INTRAVENOUS
  Filled 2023-04-27: qty 100

## 2023-04-27 MED ORDER — TRANEXAMIC ACID-NACL 1000-0.7 MG/100ML-% IV SOLN
1000.0000 mg | Freq: Once | INTRAVENOUS | Status: AC
  Administered 2023-04-27: 1000 mg via INTRAVENOUS
  Filled 2023-04-27: qty 100

## 2023-04-27 MED ORDER — SODIUM CHLORIDE 0.9% FLUSH
10.0000 mL | Freq: Two times a day (BID) | INTRAVENOUS | Status: DC
  Administered 2023-04-28 – 2023-05-01 (×8): 10 mL

## 2023-04-27 MED ORDER — BISACODYL 5 MG PO TBEC: 5.0000 mg | DELAYED_RELEASE_TABLET | Freq: Once | ORAL | Status: DC

## 2023-04-27 MED ORDER — SODIUM BICARBONATE 8.4 % IV SOLN
INTRAVENOUS | Status: AC
  Administered 2023-04-27: 100 meq via INTRAVENOUS
  Filled 2023-04-27: qty 50

## 2023-04-27 MED ORDER — SODIUM BICARBONATE 8.4 % IV SOLN
INTRAVENOUS | Status: AC
  Administered 2023-04-27: 150 meq via INTRAVENOUS
  Filled 2023-04-27: qty 150

## 2023-04-27 MED ORDER — ALBUMIN HUMAN 5 % IV SOLN
INTRAVENOUS | Status: AC
  Filled 2023-04-27: qty 250

## 2023-04-27 MED ORDER — SODIUM CHLORIDE 0.9 % IV SOLN
250.0000 mL | INTRAVENOUS | Status: AC
Start: 2023-04-27 — End: 2023-04-28

## 2023-04-27 MED ORDER — SODIUM CHLORIDE 0.9% IV SOLUTION: Freq: Once | INTRAVENOUS | Status: DC

## 2023-04-27 MED ORDER — IOHEXOL 350 MG/ML SOLN
75.0000 mL | Freq: Once | INTRAVENOUS | Status: AC | PRN
  Administered 2023-04-27: 75 mL via INTRAVENOUS

## 2023-04-27 MED ORDER — SODIUM CHLORIDE 0.9 % IV SOLN
20.0000 ug | Freq: Once | INTRAVENOUS | Status: AC
  Administered 2023-04-27: 20 ug via INTRAVENOUS
  Filled 2023-04-27: qty 5

## 2023-04-27 MED ORDER — POTASSIUM CHLORIDE 10 MEQ/50ML IV SOLN
10.0000 meq | INTRAVENOUS | Status: AC
  Administered 2023-04-27 – 2023-04-28 (×4): 10 meq via INTRAVENOUS
  Filled 2023-04-27 (×3): qty 50

## 2023-04-27 MED ORDER — PIVOT 1.5 CAL PO LIQD
1000.0000 mL | ORAL | Status: DC
Start: 2023-04-27 — End: 2023-04-27

## 2023-04-27 MED ORDER — SODIUM CHLORIDE 0.9% IV SOLUTION
Freq: Once | INTRAVENOUS | Status: AC
Start: 2023-04-27 — End: 2023-04-27

## 2023-04-27 MED ORDER — METOPROLOL TARTRATE 12.5 MG HALF TABLET: 12.5000 mg | ORAL_TABLET | Freq: Once | ORAL | Status: DC

## 2023-04-27 MED ORDER — STERILE WATER FOR IRRIGATION IR SOLN
Status: DC | PRN
  Administered 2023-04-27: 2000 mL

## 2023-04-27 MED ORDER — ORAL CARE MOUTH RINSE
15.0000 mL | OROMUCOSAL | Status: DC
  Administered 2023-04-27 – 2023-05-01 (×53): 15 mL via OROMUCOSAL

## 2023-04-27 MED ORDER — PANTOPRAZOLE SODIUM 40 MG IV SOLR
40.0000 mg | Freq: Every day | INTRAVENOUS | Status: DC
  Administered 2023-04-27 – 2023-05-01 (×5): 40 mg via INTRAVENOUS
  Filled 2023-04-27 (×5): qty 10

## 2023-04-27 MED ORDER — LACTATED RINGERS IV SOLN: INTRAVENOUS | Status: DC | PRN

## 2023-04-27 MED ORDER — ROCURONIUM BROMIDE 10 MG/ML (PF) SYRINGE
PREFILLED_SYRINGE | INTRAVENOUS | Status: DC | PRN
  Administered 2023-04-27: 60 mg via INTRAVENOUS
  Administered 2023-04-27: 40 mg via INTRAVENOUS

## 2023-04-27 MED ORDER — PROSOURCE TF20 ENFIT COMPATIBL EN LIQD
60.0000 mL | Freq: Every day | ENTERAL | Status: DC
Start: 2023-04-27 — End: 2023-04-27

## 2023-04-27 MED ORDER — MIDAZOLAM BOLUS VIA INFUSION
1.0000 mg | INTRAVENOUS | Status: DC | PRN
  Administered 2023-04-27 – 2023-04-28 (×3): 2 mg via INTRAVENOUS
  Administered 2023-04-28 (×2): 1 mg via INTRAVENOUS
  Administered 2023-04-29 – 2023-04-30 (×7): 2 mg via INTRAVENOUS
  Administered 2023-04-30: 1 mg via INTRAVENOUS
  Administered 2023-04-30 – 2023-05-01 (×6): 2 mg via INTRAVENOUS

## 2023-04-27 MED ORDER — POVIDONE-IODINE 7.5 % EX SOLN
CUTANEOUS | Status: DC | PRN
  Administered 2023-04-27: 1 via TOPICAL

## 2023-04-27 MED ORDER — STERILE WATER FOR INJECTION IV SOLN
INTRAVENOUS | Status: DC
  Filled 2023-04-27 (×3): qty 1000

## 2023-04-27 MED ORDER — SODIUM CHLORIDE 0.9% IV SOLUTION
Freq: Once | INTRAVENOUS | Status: DC
Start: 2023-04-27 — End: 2023-04-27

## 2023-04-27 MED ORDER — SODIUM CHLORIDE 0.9% FLUSH: 10.0000 mL | INTRAVENOUS | Status: DC | PRN

## 2023-04-27 SURGICAL SUPPLY — 38 items
BLADE CLIPPER SURG (BLADE) ×2 IMPLANT
BNDG GAUZE DERMACEA FLUFF 4 (GAUZE/BANDAGES/DRESSINGS) IMPLANT
CANISTER SUCT 3000ML PPV (MISCELLANEOUS) ×2 IMPLANT
CATH ROBINSON RED A/P 18FR (CATHETERS) IMPLANT
DRAPE CARDIOVASC SPLIT 88X140 (DRAPES) ×2 IMPLANT
DRAPE INCISE IOBAN 66X45 STRL (DRAPES) IMPLANT
DRAPE PERI GROIN 82X75IN TIB (DRAPES) ×2 IMPLANT
ELECT BLADE 4.0 EZ CLEAN MEGAD (MISCELLANEOUS) ×2
ELECT REM PT RETURN 9FT ADLT (ELECTROSURGICAL) ×2
ELECTRODE BLDE 4.0 EZ CLN MEGD (MISCELLANEOUS) IMPLANT
ELECTRODE REM PT RTRN 9FT ADLT (ELECTROSURGICAL) ×4 IMPLANT
FELT TEFLON 1X6 (MISCELLANEOUS) IMPLANT
GAUZE SPONGE 4X4 12PLY STRL (GAUZE/BANDAGES/DRESSINGS) IMPLANT
GLOVE BIO SURGEON STRL SZ 6.5 (GLOVE) IMPLANT
GLOVE ECLIPSE 7.5 STRL STRAW (GLOVE) ×4 IMPLANT
GOWN STRL REUS W/ TWL LRG LVL3 (GOWN DISPOSABLE) ×6 IMPLANT
GOWN STRL REUS W/ TWL XL LVL3 (GOWN DISPOSABLE) ×4 IMPLANT
HEMOSTAT SURGICEL 2X14 (HEMOSTASIS) IMPLANT
KIT BASIN OR (CUSTOM PROCEDURE TRAY) ×2 IMPLANT
KIT SUCTION CATH 14FR (SUCTIONS) IMPLANT
KIT TURNOVER KIT B (KITS) ×2 IMPLANT
NS IRRIG 1000ML POUR BTL (IV SOLUTION) ×10 IMPLANT
PACK E OPEN HEART (SUTURE) IMPLANT
PAD ARMBOARD 7.5X6 YLW CONV (MISCELLANEOUS) ×4 IMPLANT
PAD ELECT DEFIB RADIOL ZOLL (MISCELLANEOUS) ×2 IMPLANT
PENCIL BUTTON HOLSTER BLD 10FT (ELECTRODE) IMPLANT
POSITIONER HEAD DONUT 9IN (MISCELLANEOUS) ×2 IMPLANT
SUT ETHIBOND 2 0 SH 36X2 (SUTURE) IMPLANT
SUT PERMA SILK 0 CT1 (SUTURE) IMPLANT
SUT PROLENE 4 0 SH DA (SUTURE) IMPLANT
SUT PROLENE 4-0 RB1 .5 CRCL 36 (SUTURE) IMPLANT
SUT PROLENE 6 0 C 1 30 (SUTURE) IMPLANT
SUT SILK 1 TIES 10X30 (SUTURE) IMPLANT
SYSTEM SAHARA CHEST DRAIN ATS (WOUND CARE) IMPLANT
TOWEL GREEN STERILE (TOWEL DISPOSABLE) ×2 IMPLANT
TOWEL GREEN STERILE FF (TOWEL DISPOSABLE) IMPLANT
UNDERPAD 30X36 HEAVY ABSORB (UNDERPADS AND DIAPERS) ×2 IMPLANT
WATER STERILE IRR 1000ML POUR (IV SOLUTION) ×4 IMPLANT

## 2023-04-27 NOTE — H&P (View-Only) (Signed)
[address]       [place] [address]             [phone]      1 Day Post-Op  Procedure(s) (LRB): EXPLORATION POST OPERATIVE OPEN HEART (N/A) CENTRAL CANNULATION FOR ECMO (EXTRACORPOREAL MEMBRANE OXYGENATION) (N/A)   Total Length of Stay:  LOS: 1 day    SUBJECTIVE: Has been relatively stable overnight Continues with coagulopathy  Vitals:   04/27/23 0715 04/27/23 0730  BP:    Pulse:    Resp: (!) 0 15  Temp:    SpO2:  98%    Intake/Output      11/20 0701 11/21 0700 11/21 0701 11/22 0700   I.V. (mL/kg) 6364.7 (64.7)    Blood 5264 324   Other 250    IV Piggyback 3269.5    Total Intake(mL/kg) 15148.2 (153.9) 324 (3.3)   Urine (mL/kg/hr) 4350 (1.8)    Emesis/NG output 0    Blood 7415    Chest Tube 2160    Total Output 13925    Net +1223.2 +324            sodium chloride     sodium chloride     albumin human 12.5 g (04/27/23 0613)   epinephrine 4 mcg/min (04/27/23 0700)   HYDROmorphone 2 mg/hr (04/27/23 0700)   lactated ringers     lactated ringers 20 mL/hr at 04/27/23 0700   meropenem (MERREM) IV     midazolam 4 mg/hr (04/27/23 0700)   nitroGLYCERIN Stopped (04/26/23 1627)   norepinephrine (LEVOPHED) Adult infusion 11 mcg/min (04/27/23 0700)   vancomycin      CBC    Component Value Date/Time   WBC 5.9 04/27/2023 0102   RBC 3.75 (L) 04/27/2023 0102   HGB 8.5 (L) 04/27/2023 0359   HGB 15.3 12/26/2022 1036   HCT 25.0 (L) 04/27/2023 0359   HCT 45.2 12/26/2022 1036   PLT 23 (LL) 04/27/2023 0102   PLT 157 12/26/2022 1036   MCV 81.1 04/27/2023 0102   MCV 88 12/26/2022 1036   MCH 28.8 04/27/2023 0102   MCHC 35.5 04/27/2023 0102   RDW 14.9 04/27/2023 0102   RDW 13.3 12/26/2022 1036   LYMPHSABS 3.9 12/03/2020 2006   MONOABS 0.7 12/03/2020 2006   EOSABS 0.1 12/03/2020 2006   BASOSABS 0.0 12/03/2020 2006   CMP     Component Value Date/Time   NA 150 (H) 04/27/2023 0359   NA 140 12/26/2022 1036   K 4.5 04/27/2023 0359    CL 112 (H) 04/27/2023 0102   CO2 27 04/27/2023 0102   GLUCOSE 113 (H) 04/27/2023 0102   BUN 13 04/27/2023 0102   BUN 21 12/26/2022 1036   CREATININE 1.37 (H) 04/27/2023 0102   CALCIUM 7.9 (L) 04/27/2023 0102   PROT 3.5 (L) 04/27/2023 0102   PROT 7.4 09/13/2021 0801   ALBUMIN 2.8 (L) 04/27/2023 0102   ALBUMIN 4.5 09/13/2021 0801   AST 393 (H) 04/27/2023 0102   ALT 65 (H) 04/27/2023 0102   ALKPHOS 10 (L) 04/27/2023 0102   BILITOT 2.5 (H) 04/27/2023 0102   BILITOT 0.5 09/13/2021 0801   GFRNONAA 59 (L) 04/27/2023 0102   ABG    Component Value Date/Time   PHART 7.347 (L) 04/27/2023 0359   PCO2ART 50.1 (H) 04/27/2023 0359   PO2ART 105 04/27/2023 0359   HCO3 27.7 04/27/2023 0359   TCO2 29 04/27/2023 0359   ACIDBASEDEF 1.0 04/26/2023 2314   O2SAT 98 04/27/2023 0359  CBG (last 3)  Recent Labs    04/26/23 2312 04/27/23 0358 04/27/23 0718  GLUCAP 112* 128* 151*  EXAM Sedated Lungs: clear Card: rr Dressing with evidence of some hematoma under dressing  Ext: warm   ASSESSMENT: POD #1 sp aortic root and ascending replacement with CAB x 2 then ECMO Hemodynamics on ECMO with flows above 4 l/min on levo and epi. Pulsatility on arterial line with low PA pressures. Will continue support  Coagulopathy: will need correcting with blood products. Will need washout this am in OR Renal: continues with good urine output Concerns discussed with team and with family    Eugenio Hoes, MD 04/27/2023

## 2023-04-27 NOTE — Progress Notes (Signed)
ABG with worse metabolic acidosis. Lactic acid now >15.   Coming down on pressors, still making urine. No significant bleeding since returning from OR. Has moved extremities today when being moved in bed, coughing intermittently.  No concerns for seizure activity. On epi 4, NE 2.Better Aline pulsatility.   BP (!) 78/59   Pulse 90   Temp (!) 97.3 F (36.3 C)   Resp (!) 0   Ht 5\' 6"  (1.676 m)   Wt 98.4 kg   SpO2 100%   BMI 35.01 kg/m  Chest dressing similar.  Warm hands and feet bilaterally Eyes midline, no gaze deviation Abd soft, NT   Head CT STAT CT abd pelvis with contrast to r/o acute mesenteric ischemia Will get EEG after imaging.  Son and DIL updated at bedside.   Steffanie Dunn, DO 04/27/23 1:21 PM [facility]  For contact information, see Amion. If no response to pager, please call PCCM consult pager. After hours, 7PM- 7AM, please call Elink.

## 2023-04-27 NOTE — Progress Notes (Signed)
[address]       [place] [address]             [phone]      1 Day Post-Op  Procedure(s) (LRB): EXPLORATION POST OPERATIVE OPEN HEART (N/A) CENTRAL CANNULATION FOR ECMO (EXTRACORPOREAL MEMBRANE OXYGENATION) (N/A)   Total Length of Stay:  LOS: 1 day    SUBJECTIVE: Has been relatively stable overnight Continues with coagulopathy  Vitals:   04/27/23 0715 04/27/23 0730  BP:    Pulse:    Resp: (!) 0 15  Temp:    SpO2:  98%    Intake/Output      11/20 0701 11/21 0700 11/21 0701 11/22 0700   I.V. (mL/kg) 6364.7 (64.7)    Blood 5264 324   Other 250    IV Piggyback 3269.5    Total Intake(mL/kg) 15148.2 (153.9) 324 (3.3)   Urine (mL/kg/hr) 4350 (1.8)    Emesis/NG output 0    Blood 7415    Chest Tube 2160    Total Output 13925    Net +1223.2 +324            sodium chloride     sodium chloride     albumin human 12.5 g (04/27/23 0613)   epinephrine 4 mcg/min (04/27/23 0700)   HYDROmorphone 2 mg/hr (04/27/23 0700)   lactated ringers     lactated ringers 20 mL/hr at 04/27/23 0700   meropenem (MERREM) IV     midazolam 4 mg/hr (04/27/23 0700)   nitroGLYCERIN Stopped (04/26/23 1627)   norepinephrine (LEVOPHED) Adult infusion 11 mcg/min (04/27/23 0700)   vancomycin      CBC    Component Value Date/Time   WBC 5.9 04/27/2023 0102   RBC 3.75 (L) 04/27/2023 0102   HGB 8.5 (L) 04/27/2023 0359   HGB 15.3 12/26/2022 1036   HCT 25.0 (L) 04/27/2023 0359   HCT 45.2 12/26/2022 1036   PLT 23 (LL) 04/27/2023 0102   PLT 157 12/26/2022 1036   MCV 81.1 04/27/2023 0102   MCV 88 12/26/2022 1036   MCH 28.8 04/27/2023 0102   MCHC 35.5 04/27/2023 0102   RDW 14.9 04/27/2023 0102   RDW 13.3 12/26/2022 1036   LYMPHSABS 3.9 12/03/2020 2006   MONOABS 0.7 12/03/2020 2006   EOSABS 0.1 12/03/2020 2006   BASOSABS 0.0 12/03/2020 2006   CMP     Component Value Date/Time   NA 150 (H) 04/27/2023 0359   NA 140 12/26/2022 1036   K 4.5 04/27/2023 0359    CL 112 (H) 04/27/2023 0102   CO2 27 04/27/2023 0102   GLUCOSE 113 (H) 04/27/2023 0102   BUN 13 04/27/2023 0102   BUN 21 12/26/2022 1036   CREATININE 1.37 (H) 04/27/2023 0102   CALCIUM 7.9 (L) 04/27/2023 0102   PROT 3.5 (L) 04/27/2023 0102   PROT 7.4 09/13/2021 0801   ALBUMIN 2.8 (L) 04/27/2023 0102   ALBUMIN 4.5 09/13/2021 0801   AST 393 (H) 04/27/2023 0102   ALT 65 (H) 04/27/2023 0102   ALKPHOS 10 (L) 04/27/2023 0102   BILITOT 2.5 (H) 04/27/2023 0102   BILITOT 0.5 09/13/2021 0801   GFRNONAA 59 (L) 04/27/2023 0102   ABG    Component Value Date/Time   PHART 7.347 (L) 04/27/2023 0359   PCO2ART 50.1 (H) 04/27/2023 0359   PO2ART 105 04/27/2023 0359   HCO3 27.7 04/27/2023 0359   TCO2 29 04/27/2023 0359   ACIDBASEDEF 1.0 04/26/2023 2314   O2SAT 98 04/27/2023 0359  CBG (last 3)  Recent Labs    04/26/23 2312 04/27/23 0358 04/27/23 0718  GLUCAP 112* 128* 151*  EXAM Sedated Lungs: clear Card: rr Dressing with evidence of some hematoma under dressing  Ext: warm   ASSESSMENT: POD #1 sp aortic root and ascending replacement with CAB x 2 then ECMO Hemodynamics on ECMO with flows above 4 l/min on levo and epi. Pulsatility on arterial line with low PA pressures. Will continue support  Coagulopathy: will need correcting with blood products. Will need washout this am in OR Renal: continues with good urine output Concerns discussed with team and with family    Eugenio Hoes, MD 04/27/2023

## 2023-04-27 NOTE — Transfer of Care (Signed)
Immediate Anesthesia Transfer of Care Note  Patient: Johnathan Anderson  Procedure(s) Performed: EXPLORATION POST OPERATIVE OPEN HEART TRANSESOPHAGEAL ECHOCARDIOGRAM (TEE) (Esophagus)  Patient Location: ICU  Anesthesia Type:General  Level of Consciousness: sedated and Patient remains intubated per anesthesia plan  Airway & Oxygen Therapy: Patient remains intubated per anesthesia plan and Patient placed on Ventilator (see vital sign flow sheet for setting)  Post-op Assessment: Report given to RN and Post -op Vital signs reviewed and stable  Post vital signs: Reviewed and stable  Last Vitals:  See ICU flowsheet   Last Pain:  Vitals:   04/27/23 0455  TempSrc: Bladder  PainSc:       Patients Stated Pain Goal: 0 (04/26/23 0658)  Complications: No notable events documented.  Patient transported to ICU with standard monitors (HR, BP, SPO2, RR) and emergency drugs/equipment. Controlled ventilation maintained via ambu bag. Report given to bedside RN and respiratory therapist. Pt connected to ICU monitor and ventilator. All questions answered and vital signs stable before leaving

## 2023-04-27 NOTE — Progress Notes (Signed)
  Echocardiogram Echocardiogram Transesophageal has been performed.  Delcie Roch 04/27/2023, 4:15 PM

## 2023-04-27 NOTE — Progress Notes (Signed)
Date and time results received: 04/27/23 1435 Test: iSTAT lactic acid Critical Value: > 15 Name of Provider Notified: Mckinley Jewel MD Orders Received? Or Actions Taken?: 2 amps sodium bicarb, sodium bicarb drip

## 2023-04-27 NOTE — Progress Notes (Signed)
Blood given emergently to correct coags before scheduled washout in OR. MD aware.

## 2023-04-27 NOTE — TOC Initial Note (Addendum)
Transition of Care [facility]) - Initial/Assessment Note    Patient Details  Name: Johnathan Anderson MRN: [id number] Date of Birth: [birth year]  [facility] CM/SW Contact:    Nicanor Bake Phone Number: [phone] 04/27/2023, 12:05 PM  Clinical Narrative:   11:05 AM: HF CSW attempted to meet with pt at bedside. Pt was not in the room at the time. CSW will follow up with pt at a more appropriate time.   TOC will continue following.                         Patient Goals and CMS Choice            Expected Discharge Plan and Services                                              Prior Living Arrangements/Services                       Activities of Daily Living   ADL Screening (condition at time of admission) Independently performs ADLs?: Yes (appropriate for developmental age) Is the patient deaf or have difficulty hearing?: No Does the patient have difficulty seeing, even when wearing glasses/contacts?: No Does the patient have difficulty concentrating, remembering, or making decisions?: No  Permission Sought/Granted                  Emotional Assessment              Admission diagnosis:  Aortic valve replaced [Z95.2] S/P AVR (aortic valve replacement) [Z95.2] Patient Active Problem List   Diagnosis Date Noted   Aortic valve replaced 04/26/2023   S/P AVR (aortic valve replacement) 04/26/2023   Chronic migraine without aura without status migrainosus, not intractable 09/14/2022   Essential hypertension 07/07/2022   Squamous cell carcinoma, 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   Migraine with aura and without status migrainosus, not intractable 02/19/2021   Thoracic aortic aneurysm without rupture (HCC) 10/05/2020   Severe aortic stenosis 10/05/2020   Bicuspid aortic valve 10/05/2020   Family history of aortic dissection 10/05/2020   Periodontitis 10/05/2020   Memory loss 02/24/2014   Chronic back pain 02/24/2014   PCP:  Irven Coe,  MD Pharmacy:   [id number] - [place], [place] - [address] [place]  [address] Phone: [phone] Fax: [phone]     Social Determinants of Health (SDOH) Social History: SDOH Screenings   Food Insecurity: No Food Insecurity (04/27/2023)  Housing: Low Risk  (04/27/2023)  Transportation Needs: No Transportation Needs (04/27/2023)  Utilities: Not At Risk (04/27/2023)  Depression (PHQ2-9): Low Risk  (07/06/2022)  Financial Resource Strain: Medium Risk (08/12/2022)  Tobacco Use: Medium Risk (04/26/2023)   SDOH Interventions:     Readmission Risk Interventions     No data to display

## 2023-04-27 NOTE — Anesthesia Postprocedure Evaluation (Addendum)
Anesthesia Post Note  Patient: Johnathan Anderson  Procedure(s) Performed: EXPLORATION POST OPERATIVE OPEN HEART TRANSESOPHAGEAL ECHOCARDIOGRAM (TEE) (Esophagus)     Patient location during evaluation: SICU Anesthesia Type: General Level of consciousness: sedated Pain management: pain level controlled Vital Signs Assessment: post-procedure vital signs reviewed and stable Respiratory status: patient remains intubated per anesthesia plan Cardiovascular status: unstable Postop Assessment: no apparent nausea or vomiting Anesthetic complications: no   No notable events documented.  Last Vitals:    Last Pain:                 Collene Schlichter

## 2023-04-27 NOTE — OR Nursing (Signed)
Patient arrived in the OR intubated, chest open with central VA ECMO in place.   4 keepers, 4 buttons, strut(modified syringe)still present and in place.

## 2023-04-27 NOTE — Progress Notes (Signed)
Pt transported from OR17 to 2H without incident. Flow and oxygenation all within parameters.

## 2023-04-27 NOTE — Op Note (Signed)
CARDIOVASCULAR SURGERY OPERATIVE NOTE  04/27/2023 Johnathan Anderson [id number]  Surgeon:  Ashley Akin, MD  First Assistant: none   Preoperative Diagnosis:  Open chest with VA ECMO  Postoperative Diagnosis:  Same   Procedure: Re-exploration mediastinum with removal of clot and wash out of structures  Anesthesia:  General Endotracheal   Clinical History/Surgical Indication:  Pt is sp aortic root and ascending aortic replacement and required VA ECMO following post op arrest. Pt with evidence of mediastinal clot on exam  Findings: Very little overall clot and no sites of bleeding. Visually the RV has improved markedly from initial time needing ECMO. TEE with inferior and septal hypokinesia on LV with some MR  Preparation:  The patient was seen in the preoperative holding area and the correct patient, correct operation were confirmed with the patient after reviewing the medical record and catheterization. The consent was signed by me. Preoperative antibiotics were given. A pulmonary arterial line and radial arterial line were placed by the anesthesia team. The patient was taken back to the operating room and positioned supine on the operating room table. After being placed under general endotracheal anesthesia by the anesthesia team a foley catheter was placed. The neck, chest, abdomen, and both legs were prepped with betadine soap and solution and draped in the usual sterile manner. A surgical time-out was taken and the correct patient and operative procedure were confirmed with the nursing and anesthesia staff.  Operation: The previous ioband and dressings removed and sternal stent removed. Mediastinal clot removed and retractor placed. Complete removal of clot performed and all cannulation sites and suture lines examined and were hemostatic. Pleural spaces drained and previous chest tubes declotted. Entire mediastinum irrigated with antibiotic irrigation.  The sternal stent was  replaced and the kerlex soaked in antibiotic irrigation placed above and new Ioband dressings placed. Pt tolerated procedure well

## 2023-04-27 NOTE — Anesthesia Preprocedure Evaluation (Signed)
Anesthesia Evaluation  Patient identified by MRN, date of birth, ID band Patient unresponsive    Reviewed: Allergy & Precautions, NPO status , Patient's Chart, lab work & pertinent test results, Unable to perform ROS - Chart review only  Airway Mallampati: Intubated       Dental   Pulmonary former smoker On ventilator      + intubated    Cardiovascular hypertension, + Peripheral Vascular Disease   Rhythm:Regular Rate:Normal  S/p bioprosthetic AVR with bental procedure complicated by possible cardioplegia vs coronary insufficiency coming off bypass requiring CABG x 2, brought back to OR for centrally cannulated VA ECMO following VT arrest in ICU.  Currently on epi, norepi gtts.   Neuro/Psych  Headaches    GI/Hepatic negative GI ROS, Neg liver ROS,,,  Endo/Other    Class 3 obesity  Renal/GU negative Renal ROS     Musculoskeletal  (+) Arthritis ,    Abdominal   Peds  Hematology negative hematology ROS (+)   Anesthesia Other Findings Day of surgery medications reviewed with the patient.  Reproductive/Obstetrics                              Anesthesia Physical Anesthesia Plan  ASA: 4 and emergent  Anesthesia Plan: General   Post-op Pain Management:    Induction: Intravenous  PONV Risk Score and Plan: 2 and Treatment may vary due to age or medical condition  Airway Management Planned: Oral ETT  Additional Equipment:   Intra-op Plan:   Post-operative Plan: Post-operative intubation/ventilation  Informed Consent: I have reviewed the patients History and Physical, chart, labs and discussed the procedure including the risks, benefits and alternatives for the proposed anesthesia with the patient or authorized representative who has indicated his/her understanding and acceptance.     History available from chart only and Consent reviewed with POA  Plan Discussed with:  CRNA  Anesthesia Plan Comments:          Anesthesia Quick Evaluation

## 2023-04-27 NOTE — Progress Notes (Signed)
Date and time results received: 04/27/23 1305 Test: iSTAT lactic acid Critical Value: > 15 Name of Provider Notified: Chestine Spore DO Orders Received? Or Actions Taken?: CT abd/pelvis, 3 amps sodium bicarb

## 2023-04-27 NOTE — Progress Notes (Signed)
ECMO NOTE:   Indication: Cardiogenic shock due to RV faliure   Initial cannulation date: 11/20   ECMO type: VA ECMO   Dual lumen inflow/return cannula:   1) Two stage venous return cannula in the right atrium 1) 20Fr Sarns cannula in the aorta    ECMO events:   - Initial cannulation 04/26/23      Daily data:   Flow 4.05L RPM 3500 Sweep  2L    Plan:  Continue ECMO support goal 4 to 4.5L Sedation to continue: Dilaudid and versed Diuresis: Holding Patient requiring a significant amount of product initially, urine output preserved.     Discussed in multidisciplinary fashion on ECMO rounds with CCM, Cardiology, ECMO coordinator/specialist, RT, PharmD and nursing staff all present.

## 2023-04-27 NOTE — Progress Notes (Signed)
NAME:  Ramie Abdalla, MRN:  [id number], DOB:  [birth year], LOS: 1 ADMISSION DATE:  04/26/2023, CONSULTATION DATE:  04/26/23 REFERRING MD:  Dr. Leafy Ro, CHIEF COMPLAINT:  postop   History of Present Illness:  Pt encephalopathic, therefore HPI obtained from EMR.    76 yoM with hx of bicuspid AV w/ AS, aortic aneurysm, HTN, HLD GERD, and former smoker who was admitted for elective AVR.  Pt followed for progressive bicuspid aortic stenosis and moderate ascending aortic aneurysm.  Asymptomatic.  Recent echo showing normal LV function and mean gradient of 39 mmHg.  No evidence of CAD or PHTN on preop workup.  Pt elected to repair issue now prior to becoming symptomatic.   Underwent elective bioprosthetic AVR with bental procedure complicated by possible cardioplegia vs coronary insufficiency coming off bypass requiring CABG x 2, RSVG from aortic graft to RCA and LAD and remains paced.   EBL 1250 Pump time 1005- 1428 S/p FFP x2, plts, cell saver 693, UOP 1100, paralytics not reversed, given methadone   ACT prior to ICU, 145 after second protamine Arrived on epi  Pertinent  Medical History  Bicuspid AV w/ AS, aortic aneurysm, HTN, GERD, HLD, vertigo, former smoker. Quit smoking 20 years ago.   Significant Hospital Events: Including procedures, antibiotic start and stop dates in addition to other pertinent events   11/20 AVR/ bental procedure  Interim History / Subjective:  Centrally cannulated for ECMO last night. Still having oozing. No focal source of bleeding identified.   Objective   Blood pressure (!) 78/59, pulse 93, temperature (!) 97.5 F (36.4 C), temperature source Bladder, resp. rate (!) 0, height 5\' 6"  (1.676 m), weight 98.4 kg, SpO2 97%. PAP: (11-71)/(5-64) 14/10 CVP:  [2 mmHg-11 mmHg] 7 mmHg  Vent Mode: PCV FiO2 (%):  [50 %] 50 % Set Rate:  [15 bmp-18 bmp] 18 bmp Vt Set:  [510 mL] 510 mL PEEP:  [5 cmH20-10 cmH20] 8 cmH20 Pressure Support:  [10 cmH20] 10 cmH20 Plateau  Pressure:  [17 cmH20-19 cmH20] 19 cmH20   Intake/Output Summary (Last 24 hours) at 04/27/2023 0858 Last data filed at 04/27/2023 0800 Gross per 24 hour  Intake 15520.5 ml  Output 40981 ml  Net 1370.5 ml   Filed Weights   04/26/23 0644 04/27/23 0500  Weight: 90.7 kg 98.4 kg   Examination: General: critically ill appearing man lying in bed on ECMO, MV HEENT: [place]/AT, eyes anicteric. ETT in place.  Neuro: RASS -5, PERRL.  CV: open chest, paced rhythm, oozing into dressing. Central ECMO cannulas. PULM:  CTAB, Pplat 27 GI: soft, NT Extremities: no cyanosis, mild edema Skin: warm, dry, no rashes  CXR : low lung volumes, central ECMO cannulas.   Na+ 146 BUN 13 Cr 1.37 LA 2.7 WBC 5.9 H/H 10.8/30.4 Platelets 23  Vent PRVC 18/500/8/50% VA ECMO 3600 RPM> 4.46L  P ven -50 P int 300 Delta P 21    Resolved Hospital Problem list    Assessment & Plan:   Bicuspid AV with severe AS and ascending aortic aneurysm s/p AVR/ Bental procedure) complicated by post-op coagulopathy and bleeding, VT arrest, acute RV failure and cardiogenic shock requiring VA ECMO cannulation.  S/p CABG x 2 (RSVG from aortic graft to RCA and LAD via right and left greater saphenous vein harvest)  Acute biventricular failure VT cardiac arrest -VA ECMO; maintaining flows  -holding AC due to ongoing coagulopathy -washout in OR today -hold aspirin and statin for now -TEE tomorrow -open chest precautions- vanc, meropenem -NE,  epi to maintain MAP >65. Con't swan.   Severe post-op consumptive coagulopathy & thrombocytopenia ABLA -TEG this morning> transfused all clotting factors, DDAVP, TXA -repeat TEG improving, still getting products -hold AC  Acute respiratory failure with hypoxia requiring MV -changed vent to PRVC; LTVV with goal Pplat <30- meeting goals.  -titrate down PEEP -PAD protocol for sedation -VAP prevention protocol -not stable for SAT & SBT yet  HTN HLD -hold PTA  antihypertensives  GERD -PPI -start trickle TF  Hyperglycemia -SSI PRN; transitioned off insulin infusion overnight.  -goal BG 140-180  Hypernatremia -add free water enterally with TF  History of tobacco abuse, quit 20 years ago. No baseline COPD.  -recommend OP lung cancer screening referral at discharge  Two sons and daughter in law updated at bedside this morning.    Best Practice (right click and "Reselect all SmartList Selections" daily)   Diet/type: tubefeeds DVT prophylaxis: SCD GI prophylaxis: PPI Lines: Central line and Arterial Line Foley:  Yes, and it is still needed Code Status:  full code Last date of multidisciplinary goals of care discussion [per primary team]  Labs   CBC: Recent Labs  Lab 04/24/23 1057 04/26/23 0900 04/26/23 1611 04/26/23 1612 04/26/23 1837 04/26/23 1839 04/26/23 2032 04/26/23 2104 04/26/23 2114 04/26/23 2224 04/26/23 2314 04/27/23 0058 04/27/23 0102 04/27/23 0359 04/27/23 0721  WBC 6.0  --  19.1*  --  7.6  --   --  12.0*  --   --   --   --  5.9  --   --   HGB 15.0   < > 7.7*   < > 6.6*   < >  --  14.2   < > 12.6* 10.9* 9.5* 10.8* 8.5*  --   HCT 43.8   < > 23.0*   < > 19.9*   < >  --  40.3   < > 37.0* 32.0* 28.0* 30.4* 25.0*  --   MCV 86.6  --  88.1  --  86.1  --   --  82.8  --   --   --   --  81.1  --   --   PLT 151   < > 100*  --  58*  58*  --  35* 31*  --   --   --   --  23*  --  104*   < > = values in this interval not displayed.    Basic Metabolic Panel: Recent Labs  Lab 04/24/23 1057 04/26/23 0900 04/26/23 1851 04/26/23 1911 04/26/23 1942 04/26/23 2007 04/26/23 2011 04/26/23 2104 04/26/23 2114 04/26/23 2224 04/26/23 2314 04/27/23 0058 04/27/23 0102 04/27/23 0359  NA 138   < > 149*   < > 149*   < > 150* 148*   < > 150* 152* 151* 146* 150*  K 4.0   < > 3.7   < > 3.9   < > 3.9 3.9   < > 4.1 3.8 4.0 4.0 4.5  CL 106   < > 102  --  105  --  104 112*  --   --   --   --  112*  --   CO2 26  --   --   --   --    --   --  21*  --   --   --   --  27  --   GLUCOSE 103*   < > 242*  --  209*  --  182* 149*  --   --   --   --  113*  --   BUN 16   < > 12  --  11  --  10 11  --   --   --   --  13  --   CREATININE 1.04   < > 1.10  --  1.00  --  1.00 1.51*  --   --   --   --  1.37*  --   CALCIUM 9.6  --   --   --   --   --   --  7.9*  --   --   --   --  7.9*  --   MG  --   --   --   --   --   --   --  2.0  --   --   --   --  1.8  --    < > = values in this interval not displayed.   GFR: Estimated Creatinine Clearance: 63.7 mL/min (A) (by C-G formula based on SCr of 1.37 mg/dL (H)). Recent Labs  Lab 04/26/23 1611 04/26/23 1837 04/26/23 2104 04/26/23 2115 04/26/23 2346 04/27/23 0102 04/27/23 0404  WBC 19.1* 7.6 12.0*  --   --  5.9  --   LATICACIDVEN  --   --   --  9.7* 5.0* 3.9* 2.7*    Liver Function Tests: Recent Labs  Lab 04/24/23 1057 04/26/23 2104 04/27/23 0102  AST 20 370* 393*  ALT 22 67* 65*  ALKPHOS 48 16* 10*  BILITOT 0.8 2.0* 2.5*  PROT 7.4 3.1* 3.5*  ALBUMIN 4.0 2.3* 2.8*   Critical care time:      This patient is critically ill with multiple organ system failure which requires frequent high complexity decision making, assessment, support, evaluation, and titration of therapies. This was completed through the application of advanced monitoring technologies and extensive interpretation of multiple databases. During this encounter critical care time was devoted to patient care services described in this note for 80 minutes.  Steffanie Dunn, DO 04/27/23 12:59 PM [facility]  For contact information, see Amion. If no response to pager, please call PCCM consult pager. After hours, 7PM- 7AM, please call [place].

## 2023-04-27 NOTE — Interval H&P Note (Signed)
History and Physical Interval Note:  04/27/2023 9:07 AM  Johnathan Anderson  has presented today for surgery, with the diagnosis of post op bleeding.  The various methods of treatment have been discussed with the patient and family. After consideration of risks, benefits and other options for treatment, the patient has consented to  Procedure(s): EXPLORATION POST OPERATIVE OPEN HEART (N/A) as a surgical intervention.  The patient's history has been reviewed, patient examined, no change in status, stable for surgery.  I have reviewed the patient's chart and labs.  Questions were answered to the patient's satisfaction.     Eugenio Hoes

## 2023-04-27 NOTE — Progress Notes (Signed)
Pt was transported to CT scan and back to 2h22 without complications.

## 2023-04-27 NOTE — Progress Notes (Signed)
Initial Nutrition Assessment  DOCUMENTATION CODES:   Not applicable  INTERVENTION:   Plan for Cortrak placement tomorrow  Reverse trendelenburg of >10 degrees while TF infusing until able to maintain HOB >30 degrees  Tube Feeding via enteral access: Pivot 1.5 at 20 ml/hr only  Goal TF Regimen: Pivot 1.5 at 55 ml/hr with Pro-Source TF20 60 mL BID TF at goal provides 2140 kcals, 164 g of protein and 1003 mL of free water   NUTRITION DIAGNOSIS:   Increased nutrient needs related to catabolic illness, acute illness as evidenced by estimated needs.  GOAL:   Patient will meet greater than or equal to 90% of their needs  MONITOR:   Vent status, Labs, Weight trends, Skin, TF tolerance  REASON FOR ASSESSMENT:   Consult Enteral/tube feeding initiation and management, Assessment of nutrition requirement/status (ECMO)  ASSESSMENT:   57 yo male admitted for elective AVR with bental procedure with difficulty coming off bypass with concern for cardiac ischemia and required CABG x 2. Post op complicated by significant bleeding, VT arrest, cardiogenic shock; ultimately required VA ECMO cannulation. PMH includes HLD, GERD, vertigo, bicuspid aortic stenosis and ascending aortic aneurysm.  11/20 AVR/Bentyl procedure, possible cardiac ischemia requiring CABG x 2, cardiogenic shock, VA ECMO cannulation, open chest 11//21 Return to OR for removal of clot, washout  Pt on vent support VA ECMO-central cannulation Vasopressors: Levophed down to 4, epinephrine at 4, vasopressin off  Overnight pt received 9 albumin, 2 u PRBCs, 1 cryo, 2 platelets  Return to OR today with re-exploration of mediastinum with removal of clot and washout. OG removed with TEE, possible replacement today with plan for Cortrak tomorrow  Trickle TF of Pivot 1.5 at 20 order by MD via adult TF protocol; also noted order for free water flush of 100 mL q 4 hours  Labs: sodium 152 (H), potassium  Meds: ss novolog, miralax,  ss novolog    NUTRITION - FOCUSED PHYSICAL EXAM:  Unable to assess  Diet Order:   Diet Order             Diet NPO time specified  Diet effective now                   EDUCATION NEEDS:   Not appropriate for education at this time   Last BM:  PTA  Height:   Ht Readings from Last 1 Encounters:  04/26/23 5\' 6"  (1.676 m)    Weight:   Wt Readings from Last 1 Encounters:  04/27/23 98.4 kg     BMI:  Body mass index is 35.01 kg/m.  Estimated Nutritional Needs:   Kcal:  1950-2250 kcals  Protein:  130-160 g  Fluid:  1.8 L   Romelle Starcher MS, RDN, LDN, CNSC Registered Dietitian 3 Clinical Nutrition RD Pager and On-Call Pager Number Located in [place]

## 2023-04-27 NOTE — Progress Notes (Addendum)
Advanced Heart Failure Rounding Note  PCP-Cardiologist: Christell Constant, MD   Subjective:    Overnight received 9 albumin, 2 u RBCs, 1 cryo, 2 platelets  VA ECMO: Sweep 2.5 and FiO2 60% Flow 4.47L Speed 3600 RPM  Going back to OR this am for washout.  Sedated on vent.   Objective:   Weight Range: 98.4 kg Body mass index is 35.01 kg/m.   Vital Signs:   Temp:  [94.5 F (34.7 C)-97.5 F (36.4 C)] 97.5 F (36.4 C) (11/21 0455) Pulse Rate:  [54-173] 121 (11/20 2215) Resp:  [0-53] 0 (11/21 0500) BP: (56-147)/(35-106) 78/59 (11/20 2215) SpO2:  [88 %-100 %] 97 % (11/20 2215) Arterial Line BP: (36-231)/(21-130) 62/59 (11/21 0500) FiO2 (%):  [50 %] 50 % (11/21 0349) Weight:  [98.4 kg] 98.4 kg (11/21 0500) Last BM Date :  (PTA)  Weight change: Filed Weights   04/26/23 0644 04/27/23 0500  Weight: 90.7 kg 98.4 kg    Intake/Output:   Intake/Output Summary (Last 24 hours) at 04/27/2023 0658 Last data filed at 04/27/2023 0600 Gross per 24 hour  Intake 15101.94 ml  Output 16109 ml  Net 1176.94 ml      Physical Exam    General:  Critically ill appearing. HEENT: + ETT/OG Neck: Supple. R internal jugular line Cor: Open chest with dressing intact, chest tubes and central ecmo cannulas Lungs: on vent Abdomen: Soft, nontender, nondistended.  Extremities: 1+ edema Neuro: sedated   Telemetry   V paced 90, underlying rhythm asystole  Labs    CBC Recent Labs    04/26/23 2104 04/26/23 2114 04/27/23 0102 04/27/23 0359  WBC 12.0*  --  5.9  --   HGB 14.2   < > 10.8* 8.5*  HCT 40.3   < > 30.4* 25.0*  MCV 82.8  --  81.1  --   PLT 31*  --  23*  --    < > = values in this interval not displayed.   Basic Metabolic Panel Recent Labs    60/45/40 2104 04/26/23 2114 04/27/23 0102 04/27/23 0359  NA 148*   < > 146* 150*  K 3.9   < > 4.0 4.5  CL 112*  --  112*  --   CO2 21*  --  27  --   GLUCOSE 149*  --  113*  --   BUN 11  --  13  --   CREATININE  1.51*  --  1.37*  --   CALCIUM 7.9*  --  7.9*  --   MG 2.0  --  1.8  --    < > = values in this interval not displayed.   Liver Function Tests Recent Labs    04/26/23 2104 04/27/23 0102  AST 370* 393*  ALT 67* 65*  ALKPHOS 16* 10*  BILITOT 2.0* 2.5*  PROT 3.1* 3.5*  ALBUMIN 2.3* 2.8*   No results for input(s): "LIPASE", "AMYLASE" in the last 72 hours. Cardiac Enzymes No results for input(s): "CKTOTAL", "CKMB", "CKMBINDEX", "TROPONINI" in the last 72 hours.  BNP: BNP (last 3 results) No results for input(s): "BNP" in the last 8760 hours.  ProBNP (last 3 results) No results for input(s): "PROBNP" in the last 8760 hours.   D-Dimer Recent Labs    04/26/23 1837 04/26/23 2032  DDIMER <0.27 0.46   Hemoglobin A1C Recent Labs    04/24/23 1057  HGBA1C 5.5   Fasting Lipid Panel No results for input(s): "CHOL", "HDL", "LDLCALC", "TRIG", "CHOLHDL", "LDLDIRECT"  in the last 72 hours. Thyroid Function Tests No results for input(s): "TSH", "T4TOTAL", "T3FREE", "THYROIDAB" in the last 72 hours.  Invalid input(s): "FREET3"  Other results:   Imaging    DG Chest Port 1 View  Result Date: 04/26/2023 CLINICAL DATA:  ECMO. EXAM: PORTABLE CHEST 1 VIEW COMPARISON:  04/26/2023. FINDINGS: The heart size and mediastinal contours are within normal limits. A prosthetic cardiac valve is present. ECMO tubes are present. There is a right internal jugular swans Ganz catheter with the distal tip over the right pulmonary artery. Lung volumes are low and perihilar interstitial thickening is noted bilaterally. There is mild atelectasis at the lung bases. A small left pleural effusion is noted. No pneumothorax. The endotracheal tube terminates 4.2 cm above the carina. An enteric tube courses over the mediastinum and is not seen distally. A mediastinal drain is noted in the lower mediastinum. IMPRESSION: 1. Low lung volumes with atelectasis at the lung bases. 2. Small left pleural effusion. 3.  Medical devices as described above. Electronically Signed   By: Thornell Sartorius M.D.   On: 04/26/2023 23:09   DG Chest Port 1 View  Result Date: 04/26/2023 CLINICAL DATA:  Aortic valve replacement EXAM: PORTABLE CHEST 1 VIEW COMPARISON:  04/24/2023 FINDINGS: Postsurgical changes are seen consistent with the given clinical history. Endotracheal tube, gastric catheter and Swan-Ganz catheter are noted in satisfactory position. Pericardial and mediastinal drains are seen. Mild bibasilar atelectasis is noted. No pneumothorax is seen. IMPRESSION: Postsurgical changes with tubes and lines as described. Mild bibasilar atelectatic changes are noted. Electronically Signed   By: Alcide Clever M.D.   On: 04/26/2023 20:04   EP STUDY  Result Date: 04/26/2023 See surgical note for result.    Medications:     Scheduled Medications:  Chlorhexidine Gluconate Cloth  6 each Topical Q0600   docusate  100 mg Per Tube BID   insulin aspart  0-24 Units Subcutaneous Q4H   mouth rinse  15 mL Mouth Rinse Q2H   [START ON 04/28/2023] pantoprazole  40 mg Oral Daily   pantoprazole (PROTONIX) IV  40 mg Intravenous QHS   polyethylene glycol  17 g Per Tube Daily   sodium chloride flush  10-40 mL Intracatheter Q12H   sodium chloride flush  3 mL Intravenous Q12H    Infusions:  sodium chloride     sodium chloride     albumin human 12.5 g (04/27/23 9563)   cefTRIAXone (ROCEPHIN)  IV Stopped (04/26/23 2308)   epinephrine 3 mcg/min (04/27/23 0600)   HYDROmorphone 2 mg/hr (04/27/23 0600)   lactated ringers     lactated ringers 20 mL/hr at 04/27/23 0600   midazolam 4 mg/hr (04/27/23 0600)   nitroGLYCERIN Stopped (04/26/23 1627)   norepinephrine (LEVOPHED) Adult infusion 10 mcg/min (04/27/23 0600)   vancomycin      PRN Medications: sodium chloride, albumin human, HYDROmorphone, midazolam, midazolam, morphine injection, ondansetron (ZOFRAN) IV, mouth rinse, oxyCODONE, sodium chloride flush, sodium chloride  flush    Patient Profile   Rhye Knope is a 57 y.o. male with a past medical history of bicuspid aortic valve and moderate aortic root dilation who underwent graft and valve replacement. Complicated by VT arrest and biventricular shock (RV predominant) requiring central VA ecmo cannulation.   Assessment/Plan  Cardiogenic shock VT arrest -VT arrest and shock post-op 11/20 following Bental procedure and CABG X 2 (RSVG from aortic graft to RCA and from the vain bypass to LAD) -TEE images reviewed, patient with RV standstill, likely due to  ischemia and prolonged pump time.  - Back to OR 11/20 without identification of bleeding site. Cannulated on central ECMO for ongoing RV failure - Currently requiring Epi 4 and NE 11 - Flows 4.4L - Hemoglobin goal 8 - Lactic acid 9.7>>>2.7>5.1. ABG this am, 7.29/52/182/26. CCM adjusting vent. Trend lactate. - Expanding broad spectrum antibiotics with open sternum - Platelets 23K, give 2 u FFP prior to OR  2. Bicuspid aortic valve/ascending aortic aneurysm -S/p Bental 11/20 as above  3. Post-op ABLA Post-op thrombocytopenia -Has received multiple blood products -2 u FFP prior to OR -TEG panel   Length of Stay: 1  Rossetta Kama N, PA-C  04/27/2023, 6:58 AM  Advanced Heart Failure Team Pager [phone] (M-F; 7a - 5p)  Please contact [facility] for night-coverage after hours (5p -7a ) and weekends on [email or web address]

## 2023-04-27 NOTE — Anesthesia Procedure Notes (Signed)
Date/Time: 04/27/2023 10:16 AM  Performed by: Waynard Edwards, CRNAPre-anesthesia Checklist: Patient identified, Emergency Drugs available, Suction available and Patient being monitored Patient Re-evaluated:Patient Re-evaluated prior to induction Oxygen Delivery Method: Circle system utilized Preoxygenation: Pre-oxygenation with 100% oxygen Induction Type: Inhalational induction with existing ETT Placement Confirmation: positive ETCO2 Dental Injury: Teeth and Oropharynx as per pre-operative assessment

## 2023-04-28 ENCOUNTER — Inpatient Hospital Stay (HOSPITAL_COMMUNITY): Payer: Commercial Managed Care - HMO

## 2023-04-28 ENCOUNTER — Encounter (HOSPITAL_COMMUNITY): Payer: Self-pay | Admitting: Thoracic Surgery (Cardiothoracic Vascular Surgery)

## 2023-04-28 DIAGNOSIS — J9601 Acute respiratory failure with hypoxia: Secondary | ICD-10-CM | POA: Diagnosis not present

## 2023-04-28 DIAGNOSIS — D689 Coagulation defect, unspecified: Secondary | ICD-10-CM | POA: Diagnosis not present

## 2023-04-28 DIAGNOSIS — R739 Hyperglycemia, unspecified: Secondary | ICD-10-CM

## 2023-04-28 DIAGNOSIS — R570 Cardiogenic shock: Secondary | ICD-10-CM | POA: Diagnosis not present

## 2023-04-28 DIAGNOSIS — R569 Unspecified convulsions: Secondary | ICD-10-CM | POA: Diagnosis not present

## 2023-04-28 LAB — POCT I-STAT 7, (LYTES, BLD GAS, ICA,H+H)
Acid-Base Excess: 6 mmol/L — ABNORMAL HIGH (ref 0.0–2.0)
Acid-Base Excess: 7 mmol/L — ABNORMAL HIGH (ref 0.0–2.0)
Acid-Base Excess: 7 mmol/L — ABNORMAL HIGH (ref 0.0–2.0)
Acid-Base Excess: 7 mmol/L — ABNORMAL HIGH (ref 0.0–2.0)
Acid-Base Excess: 7 mmol/L — ABNORMAL HIGH (ref 0.0–2.0)
Acid-Base Excess: 7 mmol/L — ABNORMAL HIGH (ref 0.0–2.0)
Acid-Base Excess: 8 mmol/L — ABNORMAL HIGH (ref 0.0–2.0)
Acid-Base Excess: 9 mmol/L — ABNORMAL HIGH (ref 0.0–2.0)
Acid-Base Excess: 9 mmol/L — ABNORMAL HIGH (ref 0.0–2.0)
Bicarbonate: 30.6 mmol/L — ABNORMAL HIGH (ref 20.0–28.0)
Bicarbonate: 31.5 mmol/L — ABNORMAL HIGH (ref 20.0–28.0)
Bicarbonate: 31.6 mmol/L — ABNORMAL HIGH (ref 20.0–28.0)
Bicarbonate: 32.4 mmol/L — ABNORMAL HIGH (ref 20.0–28.0)
Bicarbonate: 32.8 mmol/L — ABNORMAL HIGH (ref 20.0–28.0)
Bicarbonate: 32.8 mmol/L — ABNORMAL HIGH (ref 20.0–28.0)
Bicarbonate: 33.3 mmol/L — ABNORMAL HIGH (ref 20.0–28.0)
Bicarbonate: 33.6 mmol/L — ABNORMAL HIGH (ref 20.0–28.0)
Bicarbonate: 34.6 mmol/L — ABNORMAL HIGH (ref 20.0–28.0)
Calcium, Ion: 1.14 mmol/L — ABNORMAL LOW (ref 1.15–1.40)
Calcium, Ion: 1.15 mmol/L (ref 1.15–1.40)
Calcium, Ion: 1.16 mmol/L (ref 1.15–1.40)
Calcium, Ion: 1.17 mmol/L (ref 1.15–1.40)
Calcium, Ion: 1.17 mmol/L (ref 1.15–1.40)
Calcium, Ion: 1.18 mmol/L (ref 1.15–1.40)
Calcium, Ion: 1.18 mmol/L (ref 1.15–1.40)
Calcium, Ion: 1.18 mmol/L (ref 1.15–1.40)
Calcium, Ion: 1.23 mmol/L (ref 1.15–1.40)
HCT: 20 % — ABNORMAL LOW (ref 39.0–52.0)
HCT: 22 % — ABNORMAL LOW (ref 39.0–52.0)
HCT: 23 % — ABNORMAL LOW (ref 39.0–52.0)
HCT: 24 % — ABNORMAL LOW (ref 39.0–52.0)
HCT: 24 % — ABNORMAL LOW (ref 39.0–52.0)
HCT: 27 % — ABNORMAL LOW (ref 39.0–52.0)
HCT: 27 % — ABNORMAL LOW (ref 39.0–52.0)
HCT: 27 % — ABNORMAL LOW (ref 39.0–52.0)
HCT: 28 % — ABNORMAL LOW (ref 39.0–52.0)
Hemoglobin: 6.8 g/dL — CL (ref 13.0–17.0)
Hemoglobin: 7.5 g/dL — ABNORMAL LOW (ref 13.0–17.0)
Hemoglobin: 7.8 g/dL — ABNORMAL LOW (ref 13.0–17.0)
Hemoglobin: 8.2 g/dL — ABNORMAL LOW (ref 13.0–17.0)
Hemoglobin: 8.2 g/dL — ABNORMAL LOW (ref 13.0–17.0)
Hemoglobin: 9.2 g/dL — ABNORMAL LOW (ref 13.0–17.0)
Hemoglobin: 9.2 g/dL — ABNORMAL LOW (ref 13.0–17.0)
Hemoglobin: 9.2 g/dL — ABNORMAL LOW (ref 13.0–17.0)
Hemoglobin: 9.5 g/dL — ABNORMAL LOW (ref 13.0–17.0)
O2 Saturation: 100 %
O2 Saturation: 95 %
O2 Saturation: 95 %
O2 Saturation: 98 %
O2 Saturation: 98 %
O2 Saturation: 99 %
O2 Saturation: 99 %
O2 Saturation: 99 %
O2 Saturation: 99 %
Patient temperature: 36.5
Patient temperature: 36.6
Patient temperature: 36.6
Patient temperature: 36.6
Patient temperature: 36.6
Patient temperature: 36.7
Patient temperature: 36.7
Patient temperature: 36.7
Patient temperature: 36.7
Potassium: 3.8 mmol/L (ref 3.5–5.1)
Potassium: 3.8 mmol/L (ref 3.5–5.1)
Potassium: 3.9 mmol/L (ref 3.5–5.1)
Potassium: 3.9 mmol/L (ref 3.5–5.1)
Potassium: 3.9 mmol/L (ref 3.5–5.1)
Potassium: 3.9 mmol/L (ref 3.5–5.1)
Potassium: 3.9 mmol/L (ref 3.5–5.1)
Potassium: 4 mmol/L (ref 3.5–5.1)
Potassium: 4 mmol/L (ref 3.5–5.1)
Sodium: 152 mmol/L — ABNORMAL HIGH (ref 135–145)
Sodium: 152 mmol/L — ABNORMAL HIGH (ref 135–145)
Sodium: 152 mmol/L — ABNORMAL HIGH (ref 135–145)
Sodium: 152 mmol/L — ABNORMAL HIGH (ref 135–145)
Sodium: 154 mmol/L — ABNORMAL HIGH (ref 135–145)
Sodium: 154 mmol/L — ABNORMAL HIGH (ref 135–145)
Sodium: 155 mmol/L — ABNORMAL HIGH (ref 135–145)
Sodium: 155 mmol/L — ABNORMAL HIGH (ref 135–145)
Sodium: 155 mmol/L — ABNORMAL HIGH (ref 135–145)
TCO2: 32 mmol/L (ref 22–32)
TCO2: 33 mmol/L — ABNORMAL HIGH (ref 22–32)
TCO2: 33 mmol/L — ABNORMAL HIGH (ref 22–32)
TCO2: 34 mmol/L — ABNORMAL HIGH (ref 22–32)
TCO2: 34 mmol/L — ABNORMAL HIGH (ref 22–32)
TCO2: 34 mmol/L — ABNORMAL HIGH (ref 22–32)
TCO2: 35 mmol/L — ABNORMAL HIGH (ref 22–32)
TCO2: 35 mmol/L — ABNORMAL HIGH (ref 22–32)
TCO2: 36 mmol/L — ABNORMAL HIGH (ref 22–32)
pCO2 arterial: 40.8 mm[Hg] (ref 32–48)
pCO2 arterial: 43.1 mm[Hg] (ref 32–48)
pCO2 arterial: 44.4 mm[Hg] (ref 32–48)
pCO2 arterial: 44.7 mm[Hg] (ref 32–48)
pCO2 arterial: 44.8 mm[Hg] (ref 32–48)
pCO2 arterial: 51.8 mm[Hg] — ABNORMAL HIGH (ref 32–48)
pCO2 arterial: 52.5 mm[Hg] — ABNORMAL HIGH (ref 32–48)
pCO2 arterial: 53.3 mm[Hg] — ABNORMAL HIGH (ref 32–48)
pCO2 arterial: 56.2 mm[Hg] — ABNORMAL HIGH (ref 32–48)
pH, Arterial: 7.396 (ref 7.35–7.45)
pH, Arterial: 7.402 (ref 7.35–7.45)
pH, Arterial: 7.403 (ref 7.35–7.45)
pH, Arterial: 7.403 (ref 7.35–7.45)
pH, Arterial: 7.455 — ABNORMAL HIGH (ref 7.35–7.45)
pH, Arterial: 7.458 — ABNORMAL HIGH (ref 7.35–7.45)
pH, Arterial: 7.458 — ABNORMAL HIGH (ref 7.35–7.45)
pH, Arterial: 7.482 — ABNORMAL HIGH (ref 7.35–7.45)
pH, Arterial: 7.512 — ABNORMAL HIGH (ref 7.35–7.45)
pO2, Arterial: 104 mm[Hg] (ref 83–108)
pO2, Arterial: 109 mm[Hg] — ABNORMAL HIGH (ref 83–108)
pO2, Arterial: 118 mm[Hg] — ABNORMAL HIGH (ref 83–108)
pO2, Arterial: 118 mm[Hg] — ABNORMAL HIGH (ref 83–108)
pO2, Arterial: 121 mm[Hg] — ABNORMAL HIGH (ref 83–108)
pO2, Arterial: 142 mm[Hg] — ABNORMAL HIGH (ref 83–108)
pO2, Arterial: 228 mm[Hg] — ABNORMAL HIGH (ref 83–108)
pO2, Arterial: 66 mm[Hg] — ABNORMAL LOW (ref 83–108)
pO2, Arterial: 75 mm[Hg] — ABNORMAL LOW (ref 83–108)

## 2023-04-28 LAB — BPAM FFP
Blood Product Expiration Date: 202411242359
Blood Product Expiration Date: 202411252359
Blood Product Expiration Date: 202411252359
Blood Product Expiration Date: 202411252359
Blood Product Expiration Date: 202411252359
Blood Product Expiration Date: 202411252359
Blood Product Expiration Date: 202411252359
Blood Product Expiration Date: 202411252359
Blood Product Expiration Date: 202411252359
Blood Product Expiration Date: 202411252359
Blood Product Expiration Date: 202411252359
Blood Product Expiration Date: 202412022359
Blood Product Expiration Date: 202412022359
Blood Product Expiration Date: 202412022359
Blood Product Expiration Date: 202412022359
Blood Product Expiration Date: 202412022359
Blood Product Expiration Date: 202412032359
Blood Product Expiration Date: 202412032359
Blood Product Expiration Date: 202412032359
Blood Product Expiration Date: 202411252359
ISSUE DATE / TIME: 202411201732
ISSUE DATE / TIME: 202411201732
ISSUE DATE / TIME: 202411201732
ISSUE DATE / TIME: 202411201732
ISSUE DATE / TIME: 202411201732
ISSUE DATE / TIME: 202411201732
ISSUE DATE / TIME: 202411201733
ISSUE DATE / TIME: 202411201733
ISSUE DATE / TIME: 202411201753
ISSUE DATE / TIME: 202411201753
ISSUE DATE / TIME: 202411201814
ISSUE DATE / TIME: 202411201816
ISSUE DATE / TIME: 202411201832
ISSUE DATE / TIME: 202411201832
ISSUE DATE / TIME: 202411210921
ISSUE DATE / TIME: 202411210921
ISSUE DATE / TIME: 202411211110
ISSUE DATE / TIME: 202411211254
ISSUE DATE / TIME: 202411211254
Unit Type and Rh: 600
Unit Type and Rh: 600
Unit Type and Rh: 600
Unit Type and Rh: 600
Unit Type and Rh: 6200
Unit Type and Rh: 6200
Unit Type and Rh: 6200
Unit Type and Rh: 6200
Unit Type and Rh: 6200
Unit Type and Rh: 6200
Unit Type and Rh: 6200
Unit Type and Rh: 6200
Unit Type and Rh: 6200
Unit Type and Rh: 6200
Unit Type and Rh: 6200
Unit Type and Rh: 6200
Unit Type and Rh: 6200
Unit Type and Rh: 6200
Unit Type and Rh: 6200
Unit Type and Rh: 6200

## 2023-04-28 LAB — CBC
HCT: 22 % — ABNORMAL LOW (ref 39.0–52.0)
HCT: 25.6 % — ABNORMAL LOW (ref 39.0–52.0)
HCT: 28.3 % — ABNORMAL LOW (ref 39.0–52.0)
Hemoglobin: 7.6 g/dL — ABNORMAL LOW (ref 13.0–17.0)
Hemoglobin: 8.7 g/dL — ABNORMAL LOW (ref 13.0–17.0)
Hemoglobin: 9.6 g/dL — ABNORMAL LOW (ref 13.0–17.0)
MCH: 28.5 pg (ref 26.0–34.0)
MCH: 28.6 pg (ref 26.0–34.0)
MCH: 28.8 pg (ref 26.0–34.0)
MCHC: 34.5 g/dL (ref 30.0–36.0)
MCHC: 34 g/dL (ref 30.0–36.0)
MCHC: 33.9 g/dL (ref 30.0–36.0)
MCV: 82.4 fL (ref 80.0–100.0)
MCV: 84.2 fL (ref 80.0–100.0)
MCV: 85 fL (ref 80.0–100.0)
Platelets: 91 10*3/uL — ABNORMAL LOW (ref 150–400)
Platelets: 90 10*3/uL — ABNORMAL LOW (ref 150–400)
Platelets: 90 10*3/uL — ABNORMAL LOW (ref 150–400)
RBC: 2.67 MIL/uL — ABNORMAL LOW (ref 4.22–5.81)
RBC: 3.04 MIL/uL — ABNORMAL LOW (ref 4.22–5.81)
RBC: 3.33 MIL/uL — ABNORMAL LOW (ref 4.22–5.81)
RDW: 15.5 % (ref 11.5–15.5)
RDW: 15.6 % — ABNORMAL HIGH (ref 11.5–15.5)
RDW: 15.9 % — ABNORMAL HIGH (ref 11.5–15.5)
WBC: 6.9 10*3/uL (ref 4.0–10.5)
WBC: 8.7 10*3/uL (ref 4.0–10.5)
WBC: 10.1 10*3/uL (ref 4.0–10.5)
nRBC: 0 % (ref 0.0–0.2)
nRBC: 0 % (ref 0.0–0.2)
nRBC: 0 % (ref 0.0–0.2)

## 2023-04-28 LAB — PREPARE CRYOPRECIPITATE
Unit division: 0
Unit division: 0
Unit division: 0

## 2023-04-28 LAB — APTT
aPTT: 42 s — ABNORMAL HIGH (ref 24–36)
aPTT: 40 s — ABNORMAL HIGH (ref 24–36)
aPTT: 37 s — ABNORMAL HIGH (ref 24–36)
aPTT: 37 s — ABNORMAL HIGH (ref 24–36)

## 2023-04-28 LAB — PREPARE FRESH FROZEN PLASMA
Unit division: 0
Unit division: 0
Unit division: 0
Unit division: 0
Unit division: 0
Unit division: 0
Unit division: 0
Unit division: 0
Unit division: 0
Unit division: 0
Unit division: 0
Unit division: 0
Unit division: 0
Unit division: 0
Unit division: 0

## 2023-04-28 LAB — PREPARE PLATELET PHERESIS
Unit division: 0
Unit division: 0
Unit division: 0
Unit division: 0

## 2023-04-28 LAB — COOXEMETRY PANEL
Carboxyhemoglobin: 1.5 % (ref 0.5–1.5)
Carboxyhemoglobin: 1.9 % — ABNORMAL HIGH (ref 0.5–1.5)
Methemoglobin: <0.7 % (ref 0.0–1.5)
Methemoglobin: <0.7 % (ref 0.0–1.5)
O2 Saturation: 66.8 %
O2 Saturation: 89.9 %
Total hemoglobin: 8 g/dL — ABNORMAL LOW (ref 12.0–16.0)
Total hemoglobin: 9.6 g/dL — ABNORMAL LOW (ref 12.0–16.0)

## 2023-04-28 LAB — BPAM PLATELET PHERESIS
Blood Product Expiration Date: 202411222359
Blood Product Expiration Date: 202411242359
Blood Product Expiration Date: 202411242359
Blood Product Expiration Date: 202411242359
ISSUE DATE / TIME: 202411210224
ISSUE DATE / TIME: 202411210224
ISSUE DATE / TIME: 202411210921
ISSUE DATE / TIME: 202411211254
Unit Type and Rh: 5100
Unit Type and Rh: 600
Unit Type and Rh: 5100
Unit Type and Rh: 7300

## 2023-04-28 LAB — BPAM CRYOPRECIPITATE
Blood Product Expiration Date: 202411252359
Blood Product Expiration Date: 202411262359
Blood Product Expiration Date: 202411262359
ISSUE DATE / TIME: 202411210921
ISSUE DATE / TIME: 202411210924
ISSUE DATE / TIME: 202411211254
Unit Type and Rh: 5100
Unit Type and Rh: 6200
Unit Type and Rh: 6200

## 2023-04-28 LAB — HEPATIC FUNCTION PANEL
ALT: 50 U/L — ABNORMAL HIGH (ref 0–44)
AST: 271 U/L — ABNORMAL HIGH (ref 15–41)
Albumin: 2.5 g/dL — ABNORMAL LOW (ref 3.5–5.0)
Alkaline Phosphatase: 25 U/L — ABNORMAL LOW (ref 38–126)
Bilirubin, Direct: 1.1 mg/dL — ABNORMAL HIGH (ref 0.0–0.2)
Indirect Bilirubin: 1.9 mg/dL — ABNORMAL HIGH (ref 0.3–0.9)
Total Bilirubin: 3 mg/dL — ABNORMAL HIGH (ref <1.2)
Total Protein: 4 g/dL — ABNORMAL LOW (ref 6.5–8.1)

## 2023-04-28 LAB — GLOBAL TEG PANEL
CFF Max Amplitude: 21.9 mm (ref 15–32)
CK with Heparinase (R): 6.2 min (ref 4.3–8.3)
Citrated Functional Fibrinogen: 399.6 mg/dL (ref 278–581)
Citrated Kaolin (K): 1.3 min (ref 0.8–2.1)
Citrated Kaolin (MA): 60.3 mm (ref 52–69)
Citrated Kaolin (R): 7.7 min (ref 4.6–9.1)
Citrated Kaolin Angle: 73.5 deg (ref 63–78)
Citrated Rapid TEG (MA): 58.8 mm (ref 52–70)

## 2023-04-28 LAB — BASIC METABOLIC PANEL
Anion gap: 8 (ref 5–15)
Anion gap: 9 (ref 5–15)
BUN: 15 mg/dL (ref 6–20)
BUN: 16 mg/dL (ref 6–20)
CO2: 32 mmol/L (ref 22–32)
CO2: 31 mmol/L (ref 22–32)
Calcium: 7.9 mg/dL — ABNORMAL LOW (ref 8.9–10.3)
Calcium: 8.4 mg/dL — ABNORMAL LOW (ref 8.9–10.3)
Chloride: 115 mmol/L — ABNORMAL HIGH (ref 98–111)
Chloride: 114 mmol/L — ABNORMAL HIGH (ref 98–111)
Creatinine, Ser: 1.43 mg/dL — ABNORMAL HIGH (ref 0.61–1.24)
Creatinine, Ser: 1.54 mg/dL — ABNORMAL HIGH (ref 0.61–1.24)
GFR, Estimated: 56 mL/min — ABNORMAL LOW (ref >60)
GFR, Estimated: 52 mL/min — ABNORMAL LOW (ref >60)
Glucose, Bld: 127 mg/dL — ABNORMAL HIGH (ref 70–99)
Glucose, Bld: 139 mg/dL — ABNORMAL HIGH (ref 70–99)
Potassium: 4 mmol/L (ref 3.5–5.1)
Potassium: 4 mmol/L (ref 3.5–5.1)
Sodium: 155 mmol/L — ABNORMAL HIGH (ref 135–145)
Sodium: 154 mmol/L — ABNORMAL HIGH (ref 135–145)

## 2023-04-28 LAB — PROTIME-INR
INR: 1.9 — ABNORMAL HIGH (ref 0.8–1.2)
Prothrombin Time: 21.6 s — ABNORMAL HIGH (ref 11.4–15.2)

## 2023-04-28 LAB — GLUCOSE, CAPILLARY
Glucose-Capillary: 117 mg/dL — ABNORMAL HIGH (ref 70–99)
Glucose-Capillary: 119 mg/dL — ABNORMAL HIGH (ref 70–99)
Glucose-Capillary: 125 mg/dL — ABNORMAL HIGH (ref 70–99)
Glucose-Capillary: 131 mg/dL — ABNORMAL HIGH (ref 70–99)
Glucose-Capillary: 148 mg/dL — ABNORMAL HIGH (ref 70–99)
Glucose-Capillary: 149 mg/dL — ABNORMAL HIGH (ref 70–99)

## 2023-04-28 LAB — CG4 I-STAT (LACTIC ACID)
Lactic Acid, Venous: 0.9 mmol/L (ref 0.5–1.9)
Lactic Acid, Venous: 1.5 mmol/L (ref 0.5–1.9)

## 2023-04-28 LAB — VANCOMYCIN, TROUGH: Vancomycin Tr: 18 ug/mL (ref 15–20)

## 2023-04-28 LAB — PREPARE RBC (CROSSMATCH)

## 2023-04-28 LAB — LACTATE DEHYDROGENASE: LDH: 651 U/L — ABNORMAL HIGH (ref 98–192)

## 2023-04-28 LAB — FIBRINOGEN: Fibrinogen: 412 mg/dL (ref 210–475)

## 2023-04-28 LAB — MAGNESIUM: Magnesium: 2.6 mg/dL — ABNORMAL HIGH (ref 1.7–2.4)

## 2023-04-28 MED ORDER — ROCURONIUM BROMIDE 10 MG/ML (PF) SYRINGE: 100.0000 mg | PREFILLED_SYRINGE | Freq: Once | INTRAVENOUS | Status: DC

## 2023-04-28 MED ORDER — PROSOURCE TF20 ENFIT COMPATIBL EN LIQD
60.0000 mL | ENTERAL | Status: DC
Start: 2023-04-28 — End: 2023-05-01
  Administered 2023-04-28 – 2023-05-01 (×15): 60 mL
  Filled 2023-04-28 (×16): qty 60

## 2023-04-28 MED ORDER — FREE WATER
200.0000 mL | Status: DC
  Administered 2023-04-28 – 2023-04-29 (×5): 200 mL

## 2023-04-28 MED ORDER — SODIUM CHLORIDE 0.9 % IV SOLN: 200.0000 mg | INTRAVENOUS | Status: DC

## 2023-04-28 MED ORDER — SODIUM CHLORIDE 0.9% IV SOLUTION
Freq: Once | INTRAVENOUS | Status: AC
Start: 2023-04-28 — End: 2023-04-28

## 2023-04-28 MED ORDER — PIVOT 1.5 CAL PO LIQD
1000.0000 mL | ORAL | Status: DC
  Administered 2023-04-28 – 2023-04-30 (×5): 1000 mL
  Filled 2023-04-28 (×2): qty 1000

## 2023-04-28 MED ORDER — POTASSIUM CHLORIDE 10 MEQ/50ML IV SOLN
10.0000 meq | INTRAVENOUS | Status: AC
  Administered 2023-04-28 – 2023-04-29 (×4): 10 meq via INTRAVENOUS
  Filled 2023-04-28 (×4): qty 50

## 2023-04-28 MED ORDER — MILRINONE LACTATE IN DEXTROSE 20-5 MG/100ML-% IV SOLN
0.1250 ug/kg/min | INTRAVENOUS | Status: DC
  Administered 2023-04-28: 0.125 ug/kg/min via INTRAVENOUS
  Filled 2023-04-28: qty 100

## 2023-04-28 MED ORDER — PROSOURCE TF20 ENFIT COMPATIBL EN LIQD
60.0000 mL | Freq: Every day | ENTERAL | Status: DC
  Administered 2023-04-28: 60 mL
  Filled 2023-04-28: qty 60

## 2023-04-28 MED ORDER — FUROSEMIDE 10 MG/ML IJ SOLN
4.0000 mg/h | INTRAVENOUS | Status: DC
  Administered 2023-04-28: 8 mg/h via INTRAVENOUS
  Administered 2023-04-29 – 2023-05-01 (×2): 4 mg/h via INTRAVENOUS
  Filled 2023-04-28 (×3): qty 20

## 2023-04-28 MED ORDER — DOPAMINE-DEXTROSE 3.2-5 MG/ML-% IV SOLN
3.0000 ug/kg/min | INTRAVENOUS | Status: DC
  Administered 2023-04-28: 2 ug/kg/min via INTRAVENOUS
  Administered 2023-04-29 – 2023-05-01 (×2): 3 ug/kg/min via INTRAVENOUS
  Filled 2023-04-28 (×3): qty 250

## 2023-04-28 NOTE — Progress Notes (Signed)
Nutrition Follow-up  DOCUMENTATION CODES:   Not applicable  INTERVENTION:   Reverse trendelenburg of >10 degrees while TF infusing until able to maintain HOB >30 degrees   Tube Feeding via Cortrak:  Pivot 1.5 at 20 ml/hr Goal TF Regimen: Pivot 1.5 at 55 ml/hr with Pro-Source TF20 60 mL BID TF at goal provides 2140 kcals, 164 g of protein and 1003 mL of free water  Pro-Source TF20 60 mL q 4 hours while on trickle TF,  each packet provides 20 grams of protein and 80 kcals.    NUTRITION DIAGNOSIS:   Increased nutrient needs related to catabolic illness, acute illness as evidenced by estimated needs.  Being addressed via   GOAL:   Patient will meet greater than or equal to 90% of their needs  Progressing  MONITOR:   Vent status, Labs, Weight trends, Skin, TF tolerance  REASON FOR ASSESSMENT:   Consult Enteral/tube feeding initiation and management, Assessment of nutrition requirement/status (ECMO)  ASSESSMENT:   57 yo male admitted for elective AVR with bental procedure with difficulty coming off bypass with concern for cardiac ischemia and required CABG x 2. Post op complicated by significant bleeding, VT arrest, cardiogenic shock; ultimately required VA ECMO cannulation. PMH includes HLD, GERD, vertigo, bicuspid aortic stenosis and ascending aortic aneurysm.  11/20 AVR/Bentyl procedure, possible cardiac ischemia requiring CABG x 2, cardiogenic shock, VA ECMO cannulation, open chest 11//21 Return to OR for removal of clot, washout  Pt remains on vent support, VA ECMO, open chest. Levophed at 10. Started on lasix gtt  No BM yet, abdomen soft, BS present  Originally planned to start trickle TF yesterday but pt decompensated later in the day, lactic acid >15, concern for gut ischemia. CT abdomen negative. Acidosis now resolved after epinephrine stopped  Hypernatremia persists; free water flushes ordered by MD  Current wt 110.5 kg, wt yesterday 98.4 kg. UOP almost 4 L  in 24 hours, lasix gtt today and almost 3L already today   Chest tubes yesterday with 960 mL, 90 mL thus far today  Labs: sodium 155 (H), Creatinine 1.43, potassium 3.9 (wdl), magnesium 2.6 (H), no phosphorus Meds: ss novolog, colace, lasix gtt, miralax, potassium chloride   Diet Order:   Diet Order             Diet NPO time specified  Diet effective now                   EDUCATION NEEDS:   Not appropriate for education at this time  Skin:  Skin Assessment: Skin Integrity Issues: Skin Integrity Issues:: Other (Comment) Other: open chest  Last BM:  PTA  Height:   Ht Readings from Last 1 Encounters:  04/26/23 5\' 6"  (1.676 m)    Weight:   Wt Readings from Last 1 Encounters:  04/28/23 100.5 kg    BMI:  Body mass index is 35.76 kg/m.  Estimated Nutritional Needs:   Kcal:  1950-2250 kcals  Protein:  130-160 g  Fluid:  1.8 L   Romelle Starcher MS, RDN, LDN, CNSC Registered Dietitian 3 Clinical Nutrition RD Pager and On-Call Pager Number Located in [place]

## 2023-04-28 NOTE — Progress Notes (Signed)
NAME:  Johnathan Anderson, MRN:  [id number], DOB:  Dec 05, 1965, LOS: 2 ADMISSION DATE:  04/26/2023, CONSULTATION DATE:  04/26/23 REFERRING MD:  Dr. Leafy Ro, CHIEF COMPLAINT:  postop   History of Present Illness:  Pt encephalopathic, therefore HPI obtained from EMR.    39 yoM with hx of bicuspid AV w/ AS, aortic aneurysm, HTN, HLD GERD, and former smoker who was admitted for elective AVR.  Pt followed for progressive bicuspid aortic stenosis and moderate ascending aortic aneurysm.  Asymptomatic.  Recent echo showing normal LV function and mean gradient of 39 mmHg.  No evidence of CAD or PHTN on preop workup.  Pt elected to repair issue now prior to becoming symptomatic.   Underwent elective bioprosthetic AVR with bental procedure complicated by possible cardioplegia vs coronary insufficiency coming off bypass requiring CABG x 2, RSVG from aortic graft to RCA and LAD and remains paced.   EBL 1250 Pump time 1005- 1428 S/p FFP x2, plts, cell saver 693, UOP 1100, paralytics not reversed, given methadone   ACT prior to ICU, 145 after second protamine Arrived on epi  Pertinent  Medical History  Bicuspid AV w/ AS, aortic aneurysm, HTN, GERD, HLD, vertigo, former smoker. Quit smoking 20 years ago.   Significant Hospital Events: Including procedures, antibiotic start and stop dates in addition to other pertinent events   11/20 AVR/ bental procedure  Interim History / Subjective:  Overnight no acute events. Remains sedated on ecmo, MV.  Lactic acidosis resolved after epinephrine infusion stopped.  Objective   Blood pressure 96/77, pulse 97, temperature 97.9 F (36.6 C), resp. rate 18, height 5\' 6"  (1.676 m), weight 100.5 kg, SpO2 100%. PAP: (11-26)/(6-18) 13/8 CVP:  [3 mmHg-16 mmHg] 7 mmHg  Vent Mode: PRVC FiO2 (%):  [50 %] 50 % Set Rate:  [15 bmp-18 bmp] 18 bmp Vt Set:  [500 mL] 500 mL PEEP:  [8 cmH20-10 cmH20] 8 cmH20 Pressure Support:  [10 cmH20] 10 cmH20 Plateau Pressure:  [19 cmH20-25  cmH20] 25 cmH20   Intake/Output Summary (Last 24 hours) at 04/28/2023 0713 Last data filed at 04/28/2023 0700 Gross per 24 hour  Intake 4843.53 ml  Output 4895 ml  Net -51.47 ml   Filed Weights   04/26/23 0644 04/27/23 0500 04/28/23 0500  Weight: 90.7 kg 98.4 kg 100.5 kg   Examination: General: critically ill appearing man lying in bed in NAD HEENT: [place]/AT, eyes anicteric Neuro: RASS -5, strong cough, PERRL  CV: open chest, centrally cannulated.  PULM:  no rhonchi, Pplat 31 on VT 500, improved with reduced Vt.  GI: soft, NT Extremities: +edema, no cyanosis. Skin: warm, dry, no rashes  CXR : central ECMO cannulas, unable to see exactly where ETT terminates  7.46/44/118/32 Na+ 155 BUN 15 Cr 1.43 AST 271 ALT 50 T bili 3 LA 0.9 WBC 6.9 H/H 7.6/22 Platelets 91  Vent PRVC 18/470/8/50%  VA ECMO 3600 RPM> 4.36L  , 3L sweep P ven -39 P int 309 Delta P 20    Resolved Hospital Problem list    Assessment & Plan:   Bicuspid AV with severe AS and ascending aortic aneurysm s/p AVR/ Bental procedure) complicated by post-op coagulopathy and bleeding, VT arrest, acute RV failure and cardiogenic shock requiring VA ECMO cannulation.  S/p CABG x 2 (RSVG from aortic graft to RCA and LAD via right and left greater saphenous vein harvest)  Acute biventricular failure VT cardiac arrest -VA ECMO, centrally cannulated. Poor pulsatility still, no increase when circuit flows are reduced but  MAP dropped. -check TEG -con't holding AC for now -hold aspirin, statin -open chest precautions, remain heavily sedated -con't swan, avoid epi due to lactic acidosis on 11/21; starting milrinone. -lasix gtt  Severe post-op consumptive coagulopathy & thrombocytopenia ABLA -TEG  -still need to hold [facility]  Acute respiratory failure with hypoxia requiring MV; increasing pulmonary edema with increased Pplat> now on slightly lower Vt/ -LTVV; still close to 8cc, but decreased to 370cc to get Pplat to  goal -con't PEEP 8; titrate FiO2 as needed for oxygenation with circuit changes -PAD protocol; goal RASS -4 to -5 -VAP prevention protocol -titrate down PEEP -not stable for SAT & SBT  AKI, expected with post-op hypotension -maintain adequate perfusion -strict I/O -renally dose meds, avoid nephrotoxic meds  HTN HLD -con't holding PTA antihypertensives  GERD -PPI -start trickle TF  Hyperglycemia -SSI PRN -goal BG <180  Hypernatremia -add free water  Shock liver -maintain adequate perfusion, diuresis  History of tobacco abuse, quit 20 years ago. No baseline COPD.  -recommend OP lung cancer screening referral at discharge  No family at bedside this morning; will update when at bedside.    Best Practice (right click and "Reselect all SmartList Selections" daily)   Diet/type: tubefeeds DVT prophylaxis: SCD GI prophylaxis: PPI Lines: Central line and Arterial Line Foley:  Yes, and it is still needed Code Status:  full code Last date of multidisciplinary goals of care discussion [per primary team]  Labs   CBC: Recent Labs  Lab 04/27/23 0102 04/27/23 0359 04/27/23 0721 04/27/23 0804 04/27/23 1553 04/27/23 1642 04/27/23 2004 04/27/23 2009 04/28/23 0002 04/28/23 0106 04/28/23 0304 04/28/23 0506  WBC 5.9  --  7.3  --  5.7  --  6.8  --   --   --  6.9  --   HGB 10.8*   < > 9.1*   < > 7.2*   < > 8.0*  6.8* 8.8* 8.2* 7.8* 7.6* 7.5*  HCT 30.4*   < > 25.9*   < > 20.0*   < > 22.5*  20.0* 26.0* 24.0* 23.0* 22.0* 22.0*  MCV 81.1  --  83.5  --  82.0  --  81.8  --   --   --  82.4  --   PLT 23*  --  99*  104*  --  102*  100*  --  101*  --   --   --  91*  --    < > = values in this interval not displayed.    Basic Metabolic Panel: Recent Labs  Lab 04/24/23 1057 04/26/23 0900 04/26/23 2104 04/26/23 2114 04/27/23 0102 04/27/23 0359 04/27/23 1039 04/27/23 1206 04/27/23 1553 04/27/23 1642 04/27/23 2009 04/28/23 0002 04/28/23 0106 04/28/23 0304  04/28/23 0506  NA 138   < > 148*   < > 146*   < > 152*   < > 153*   < > 155* 154* 154* 155* 155*  K 4.0   < > 3.9   < > 4.0   < > 3.5   < > 3.6   < > 3.2* 3.8 3.9 4.0 3.9  CL 106   < > 112*  --  112*  --  107  --  111  --   --   --   --  115*  --   CO2 26  --  21*  --  27  --   --   --  26  --   --   --   --  32  --   GLUCOSE 103*   < > 149*  --  113*  --  184*  --  189*  --   --   --   --  127*  --   BUN 16   < > 11  --  13  --  15  --  14  --   --   --   --  15  --   CREATININE 1.04   < > 1.51*  --  1.37*  --  1.40*  --  1.86*  --   --   --   --  1.43*  --   CALCIUM 9.6  --  7.9*  --  7.9*  --   --   --  7.9*  --   --   --   --  7.9*  --   MG  --   --  2.0  --  1.8  --   --   --  1.7  --   --   --   --  2.6*  --    < > = values in this interval not displayed.   GFR: Estimated Creatinine Clearance: 61.8 mL/min (A) (by C-G formula based on SCr of 1.43 mg/dL (H)). Recent Labs  Lab 04/27/23 0721 04/27/23 0725 04/27/23 1553 04/27/23 1603 04/27/23 1747 04/27/23 2004 04/27/23 2005 04/28/23 0003 04/28/23 0304 04/28/23 0510  WBC 7.3  --  5.7  --   --  6.8  --   --  6.9  --   LATICACIDVEN  --    < >  --    < > 6.5*  --  3.5* 1.5  --  0.9   < > = values in this interval not displayed.    Liver Function Tests: Recent Labs  Lab 04/24/23 1057 04/26/23 2104 04/27/23 0102 04/28/23 0304  AST 20 370* 393* 271*  ALT 22 67* 65* 50*  ALKPHOS 48 16* 10* 25*  BILITOT 0.8 2.0* 2.5* 3.0*  PROT 7.4 3.1* 3.5* 4.0*  ALBUMIN 4.0 2.3* 2.8* 2.5*   Critical care time:      This patient is critically ill with multiple organ system failure which requires frequent high complexity decision making, assessment, support, evaluation, and titration of therapies. This was completed through the application of advanced monitoring technologies and extensive interpretation of multiple databases. During this encounter critical care time was devoted to patient care services described in this note for 70  minutes.  Steffanie Dunn, DO 04/28/23 8:58 AM [facility]  For contact information, see Amion. If no response to pager, please call PCCM consult pager. After hours, 7PM- 7AM, please call Elink.

## 2023-04-28 NOTE — Progress Notes (Addendum)
      [address]       [place] [address]             [phone]    POD # 2/1   Intubated, sedated  BP 96/77   Pulse 89   Temp 98.1 F (36.7 C)   Resp 18   Ht 5\' 6"  (1.676 m)   Wt 100.5 kg   SpO2 99%   BMI 35.76 kg/m   CVP 6 On dopamine and norepi Lasix drip at 6 with 3.5L of UO PM labs pending  Remains critically ill but relatively stable at present  Strang C. Dorris Fetch, MD [facility] [phone]

## 2023-04-28 NOTE — Progress Notes (Signed)
Increasing norepi requirements today since starting on milrinone and lasix infusions.   Current PP 9mm Hg ECMO flows aruond 4L with increasing p venous to mid 40s.  Net negative 2L  today so far.   TEG WNL this morning.  D/w AHF-- change milrinone to dopamine , decrease lasix to 6mg /h  Steffanie Dunn, DO 04/28/23 4:52 PM [facility]  For contact information, see Amion. If no response to pager, please call PCCM consult pager. After hours, 7PM- 7AM, please call Elink.

## 2023-04-28 NOTE — Progress Notes (Signed)
Pharmacy Antibiotic Note Johnathan Anderson is a 57 y.o. male s/p bental procedure and bioprosthetic AVR with cardiogenic shock post procedure now on ECMO .  Pharmacy has been consulted for vancomycin and meropenem dosing due to open sternum.  Scr trending down today to 1.43 (CrC 61 mL/min). WBC 8.7, afebrile. On day #3 of antibiotics. Vancomycin trough this evening prior to 4th dose (of note, timing slightly off with trips back to OR) came back therapeutic at 18.   Plan: -Continue meropenem 1g IV every 8 hours -Will continue vancomycin 1250mg  IV every 12 hours >> monitor Scr trend if need to adjust -Will follow renal function, vancomycin levels and clinical progress    Height: 5\' 6"  (167.6 cm) Weight: 100.5 kg (221 lb 9 oz) IBW/kg (Calculated) : 63.8  Temp (24hrs), Avg:97.9 F (36.6 C), Min:97.7 F (36.5 C), Max:98.1 F (36.7 C)  Recent Labs  Lab 04/26/23 2104 04/26/23 2115 04/27/23 0102 04/27/23 0404 04/27/23 0721 04/27/23 0725 04/27/23 1039 04/27/23 1300 04/27/23 1553 04/27/23 1603 04/27/23 1747 04/27/23 2004 04/27/23 2005 04/28/23 0003 04/28/23 0304 04/28/23 0510 04/28/23 1045 04/28/23 1422  WBC 12.0*  --  5.9  --  7.3  --   --   --  5.7  --   --  6.8  --   --  6.9  --  8.7  --   CREATININE 1.51*  --  1.37*  --   --   --  1.40*  --  1.86*  --   --   --   --   --  1.43*  --   --   --   LATICACIDVEN  --    < > 3.9*   < >  --    < >  --    < >  --  10.7* 6.5*  --  3.5* 1.5  --  0.9  --   --   VANCOTROUGH  --   --   --   --   --   --   --   --   --   --   --   --   --   --   --   --   --  18   < > = values in this interval not displayed.    Estimated Creatinine Clearance: 61.8 mL/min (A) (by C-G formula based on SCr of 1.43 mg/dL (H)).    Allergies  Allergen Reactions   Levaquin [Levofloxacin]     unknown    Antimicrobials this admission: Cefazolin 11/20 Ceftriaxone 11/20 x1 Meropenem 11/21 >> Vancomycin 11/20 >>  Dose adjustments this  admission: N/A  Microbiology results: None  Thank you for allowing pharmacy to participate in this patient's care,  Sherron Monday, PharmD, BCCCP Clinical Pharmacist  Phone: [phone] 04/28/2023 4:10 PM  Please check AMION for all [facility] Pharmacy phone numbers After 10:00 PM, call Main Pharmacy [phone]

## 2023-04-28 NOTE — Procedures (Addendum)
Cortrak  Person Inserting Tube:  Fox Salminen T, RD Tube Type:  Cortrak - 43 inches Tube Size:  10 Tube Location:  Right nare Secured by: Bridle Technique Used to Measure Tube Placement:  Marking at nare/corner of mouth Cortrak Secured At:  83 cm   Cortrak Tube Team Note:  Consult received to place a Cortrak feeding tube.   X-ray is required, abdominal x-ray has been ordered by the Cortrak team. Please confirm tube placement before using the Cortrak tube.   If the tube becomes dislodged please keep the tube and contact the Cortrak team at [email or web address] for replacement.  If after hours and replacement cannot be delayed, place a NG tube and confirm placement with an abdominal x-ray.    Shelle Iron RD, LDN For contact information, refer to [facility].

## 2023-04-28 NOTE — Progress Notes (Signed)
LTM EEG discontinued - no skin breakdown at [facility].LTM EEG discontinued - skin abrasion  at [facility] FP1,FP2, F7,F8.RN aware

## 2023-04-28 NOTE — TOC Initial Note (Signed)
Transition of Care [facility]) - Initial/Assessment Note    Patient Details  Name: Johnathan Anderson MRN: [id number] Date of Birth: [birth year]  [facility] CM/SW Contact:    Elliot Cousin, RN Phone Number: [phone] 04/28/2023, 6:33 PM  Clinical Narrative:     CM spoke to son at bedside. States pt was independent pta, lives with wife and son.  Pt was driving to appts. Pt does not work currently. Will continue to follow for dc needs.                Expected Discharge Plan: IP Rehab Facility Barriers to Discharge: Continued Medical Work up   Patient Goals and CMS Choice            Expected Discharge Plan and Services   Discharge Planning Services: CM Consult   Living arrangements for the past 2 months: Single Family Home                                      Prior Living Arrangements/Services Living arrangements for the past 2 months: Single Family Home Lives with:: Adult Children, Spouse                   Activities of Daily Living   ADL Screening (condition at time of admission) Independently performs ADLs?: Yes (appropriate for developmental age) Is the patient deaf or have difficulty hearing?: No Does the patient have difficulty seeing, even when wearing glasses/contacts?: No Does the patient have difficulty concentrating, remembering, or making decisions?: No  Permission Sought/Granted                  Emotional Assessment   Attitude/Demeanor/Rapport: Intubated (Following Commands or Not Following Commands)          Admission diagnosis:  Aortic valve replaced [Z95.2] S/P AVR (aortic valve replacement) [Z95.2] Patient Active Problem List   Diagnosis Date Noted   Aortic valve replaced 04/26/2023   S/P AVR (aortic valve replacement) 04/26/2023   Chronic migraine without aura without status migrainosus, not intractable 09/14/2022   Essential hypertension 07/07/2022   Squamous cell carcinoma, 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   Migraine  with aura and without status migrainosus, not intractable 02/19/2021   Thoracic aortic aneurysm without rupture (HCC) 10/05/2020   Severe aortic stenosis 10/05/2020   Bicuspid aortic valve 10/05/2020   Family history of aortic dissection 10/05/2020   Periodontitis 10/05/2020   Memory loss 02/24/2014   Chronic back pain 02/24/2014   PCP:  Irven Coe, MD Pharmacy:   [id number] - [place], [place] - [address] [place] [address] Phone: [phone] Fax: [phone]     Social Determinants of Health (SDOH) Social History: SDOH Screenings   Food Insecurity: No Food Insecurity (04/27/2023)  Housing: Low Risk  (04/27/2023)  Transportation Needs: No Transportation Needs (04/27/2023)  Utilities: Not At Risk (04/27/2023)  Depression (PHQ2-9): Low Risk  (07/06/2022)  Financial Resource Strain: Medium Risk (08/12/2022)  Tobacco Use: Medium Risk (04/26/2023)   SDOH Interventions:     Readmission Risk Interventions     No data to display

## 2023-04-28 NOTE — Procedures (Addendum)
Patient Name: Johnathan Anderson  MRN: [id number]  Epilepsy Attending: Charlsie Quest  Referring Physician/Provider: Erick Blinks, MD  Duration: 04/27/2023 1633 to 04/28/2023 1709  Patient history: 57 yo M with ams getting eeg to evaluate for seizure  Level of alertness:  comatose  AEDs during EEG study: Versed  Technical aspects: This EEG study was done with scalp electrodes positioned according to the 10-20 International system of electrode placement. Electrical activity was reviewed with band pass filter of 1-70Hz , sensitivity of 7 uV/mm, display speed of 74mm/sec with a 60Hz  notched filter applied as appropriate. EEG data were recorded continuously and digitally stored.  Video monitoring was available and reviewed as appropriate.  Description:  EEG showed continuous generalized 3 to 6 Hz theta-delta slowing with excessive amount of 15 to 18 Hz beta activity distributed symmetrically and diffusely. Hyperventilation and photic stimulation were not performed.     ABNORMALITY - Continuous slow, generalized - Excessive beta, generalized  IMPRESSION: This study is suggestive of severe diffuse encephalopathy, likely related to sedation. No seizures or epileptiform discharges were seen throughout the recording.  Hettie Roselli Annabelle Harman

## 2023-04-28 NOTE — Plan of Care (Signed)
  Problem: Education: Goal: Knowledge of General Education information will improve Description: Including pain rating scale, medication(s)/side effects and non-pharmacologic comfort measures Outcome: Progressing   Problem: Health Behavior/Discharge Planning: Goal: Ability to manage health-related needs will improve Outcome: Progressing   Problem: Clinical Measurements: Goal: Ability to maintain clinical measurements within normal limits will improve Outcome: Progressing Goal: Will remain free from infection Outcome: Progressing Goal: Diagnostic test results will improve Outcome: Progressing Goal: Respiratory complications will improve Outcome: Progressing Goal: Cardiovascular complication will be avoided Outcome: Progressing   Problem: Activity: Goal: Risk for activity intolerance will decrease Outcome: Progressing   Problem: Nutrition: Goal: Adequate nutrition will be maintained Outcome: Progressing   Problem: Coping: Goal: Level of anxiety will decrease Outcome: Progressing   Problem: Elimination: Goal: Will not experience complications related to bowel motility Outcome: Progressing Goal: Will not experience complications related to urinary retention Outcome: Progressing   Problem: Pain Management: Goal: General experience of comfort will improve Outcome: Progressing   Problem: Safety: Goal: Ability to remain free from injury will improve Outcome: Progressing   Problem: Skin Integrity: Goal: Risk for impaired skin integrity will decrease Outcome: Progressing   Problem: Education: Goal: Will demonstrate proper wound care and an understanding of methods to prevent future damage Outcome: Progressing Goal: Knowledge of disease or condition will improve Outcome: Progressing Goal: Knowledge of the prescribed therapeutic regimen will improve Outcome: Progressing Goal: Individualized Educational Video(s) Outcome: Progressing   Problem: Activity: Goal: Risk for  activity intolerance will decrease Outcome: Progressing   Problem: Cardiac: Goal: Will achieve and/or maintain hemodynamic stability Outcome: Progressing   Problem: Clinical Measurements: Goal: Postoperative complications will be avoided or minimized Outcome: Progressing   Problem: Respiratory: Goal: Respiratory status will improve Outcome: Progressing   Problem: Skin Integrity: Goal: Wound healing without signs and symptoms of infection Outcome: Progressing Goal: Risk for impaired skin integrity will decrease Outcome: Progressing   Problem: Urinary Elimination: Goal: Ability to achieve and maintain adequate renal perfusion and functioning will improve Outcome: Progressing   Problem: Activity: Goal: Ability to tolerate increased activity will improve Outcome: Progressing   Problem: Respiratory: Goal: Ability to maintain a clear airway and adequate ventilation will improve Outcome: Progressing   Problem: Role Relationship: Goal: Method of communication will improve Outcome: Progressing

## 2023-04-28 NOTE — Progress Notes (Signed)
Advanced Heart Failure Rounding Note  PCP-Cardiologist: Christell Constant, MD   Subjective:    Remains intubated/sedated.  On central VA/ECMO  NE 4  Acidosis resolved off epi.   Flow 4.37L RPM 3600 Sweep  2L  PAP: (11-26)/(6-18) 13/8 CVP:  [3 mmHg-16 mmHg] 8 mmHg  Co-ox 67%  CT bleeding resolved  LA 0.9 LDH 584 -> 651  Hgb 7.5 Sodium 155 SCr 1.43 Bili 3.0   Objective:   Weight Range: 100.5 kg Body mass index is 35.76 kg/m.   Vital Signs:   Temp:  [96.4 F (35.8 C)-98.1 F (36.7 C)] 97.9 F (36.6 C) (11/22 0750) Pulse Rate:  [61-160] 90 (11/22 0802) Resp:  [0-18] 18 (11/22 0802) BP: (96)/(77) 96/77 (11/21 1936) SpO2:  [93 %-100 %] 100 % (11/22 0802) Arterial Line BP: (66-98)/(58-78) 74/65 (11/22 0750) FiO2 (%):  [50 %] 50 % (11/22 0745) Weight:  [100.5 kg] 100.5 kg (11/22 0500) Last BM Date :  (PTA)  Weight change: Filed Weights   04/26/23 0644 04/27/23 0500 04/28/23 0500  Weight: 90.7 kg 98.4 kg 100.5 kg    Intake/Output:   Intake/Output Summary (Last 24 hours) at 04/28/2023 9604 Last data filed at 04/28/2023 0800 Gross per 24 hour  Intake 4503.19 ml  Output 4820 ml  Net -316.81 ml      Physical Exam    General:  Intubated/sedated HEENT: normal + ETT/OG Neck: supple. RIJ swan Cor: Chest open. With dressing in place + CTs Lungs: minimal BS Abdomen: obese  soft, nontender, nondistended. Hypoactive bowel sounds. Extremities: no cyanosis, clubbing, rash, 2+ edema Neuro: intubated sedated   Telemetry   V paced 90 Personally reviewed  Labs    CBC Recent Labs    04/27/23 2004 04/27/23 2009 04/28/23 0304 04/28/23 0506  WBC 6.8  --  6.9  --   HGB 8.0*  6.8*   < > 7.6* 7.5*  HCT 22.5*  20.0*   < > 22.0* 22.0*  MCV 81.8  --  82.4  --   PLT 101*  --  91*  --    < > = values in this interval not displayed.   Basic Metabolic Panel Recent Labs    54/09/81 1553 04/27/23 1642 04/28/23 0304 04/28/23 0506  NA 153*    < > 155* 155*  K 3.6   < > 4.0 3.9  CL 111  --  115*  --   CO2 26  --  32  --   GLUCOSE 189*  --  127*  --   BUN 14  --  15  --   CREATININE 1.86*  --  1.43*  --   CALCIUM 7.9*  --  7.9*  --   MG 1.7  --  2.6*  --    < > = values in this interval not displayed.   Liver Function Tests Recent Labs    04/27/23 0102 04/28/23 0304  AST 393* 271*  ALT 65* 50*  ALKPHOS 10* 25*  BILITOT 2.5* 3.0*  PROT 3.5* 4.0*  ALBUMIN 2.8* 2.5*   No results for input(s): "LIPASE", "AMYLASE" in the last 72 hours. Cardiac Enzymes No results for input(s): "CKTOTAL", "CKMB", "CKMBINDEX", "TROPONINI" in the last 72 hours.  BNP: BNP (last 3 results) No results for input(s): "BNP" in the last 8760 hours.  ProBNP (last 3 results) No results for input(s): "PROBNP" in the last 8760 hours.   D-Dimer Recent Labs    04/27/23 0721 04/27/23 1553  DDIMER 1.79*  0.60*   Hemoglobin A1C No results for input(s): "HGBA1C" in the last 72 hours.  Fasting Lipid Panel No results for input(s): "CHOL", "HDL", "LDLCALC", "TRIG", "CHOLHDL", "LDLDIRECT" in the last 72 hours. Thyroid Function Tests No results for input(s): "TSH", "T4TOTAL", "T3FREE", "THYROIDAB" in the last 72 hours.  Invalid input(s): "FREET3"  Other results:   Imaging    ECHO INTRAOPERATIVE TEE  Result Date: 04/27/2023  *INTRAOPERATIVE TRANSESOPHAGEAL REPORT *  [name] Date of Exam: 04/27/2023 Medical Rec #:  [id number]      Height:       66.0 in Accession #:    [id number]     Weight:       216.9 lb Date of Birth:  1966/04/22       BSA:          2.07 m Patient Age:    57 years       BP:           118/93 mmHg Patient Gender: M              HR:           89 bpm. Exam Location:  Anesthesiology Transesophogeal exam was perform intraoperatively during surgical procedure. Patient was closely monitored under general anesthesia during the entirety of examination. Indications:     S/p aortic valve replacement, s/p [facility] ECMO  cannulation, open                  chest Performing Phys: [id number] Eugenio Hoes Complications: No known complications during this procedure. PRE-OP FINDINGS  Left Ventricle: The left ventricle has mild-moderately reduced systolic function, with an ejection fraction of 40-45%. The cavity size was normal. There is hypokinesis of the anterioseptal, septal, inferoseptal, and inferior walls.  Right Ventricle: The right ventricle has moderately reduced systolic function. The cavity was normal. There is no increase in right ventricular wall thickness. Left Atrium: Left atrial size was normal in size. No left atrial/left atrial appendage thrombus was detected. Right Atrium: Right atrial size was normal in size. Interatrial Septum: No atrial level shunt detected by color flow Doppler. Pericardium: There is no evidence of pericardial effusion. Mitral Valve: The mitral valve is normal in structure. Mitral valve regurgitation is mild by color flow Doppler. Tricuspid Valve: The tricuspid valve was normal in structure. Tricuspid valve regurgitation is mild by color flow Doppler. Aortic Valve: The aortic valve has been repaired/replaced Aortic valve regurgitation was not visualized by color flow Doppler. There is a 23mm Inspiris valve in the aortic position. The valve appears well seated.  Pulmonic Valve: The pulmonic valve was normal in structure. Pulmonic valve regurgitation is not visualized by color flow Doppler. Aorta: S/p Aortic root replacement.  Arrie Aran MD Electronically signed by Arrie Aran MD Signature Date/Time: 04/27/2023/4:12:48 PM    Final    DG CHEST PORT 1 VIEW  Result Date: 04/27/2023 CLINICAL DATA:  Status post AVR EXAM: PORTABLE CHEST 1 VIEW COMPARISON:  Chest x-ray 04/27/2023 and older. FINDINGS: Stable multiple tubes and lines including ECMO catheters, Swan-Ganz catheter in the right pulmonary artery, mediastinal drains, ET tube. The enteric tube is no longer seen. Underinflation with persistent  pleural effusion and lung base opacities, left-greater-than-right. No pneumothorax or edema. Stable cardiopericardial silhouette. Prosthetic aortic valve. Overlapping cardiac leads. IMPRESSION: Interval removal of the enteric tube. Otherwise no significant interval change Electronically Signed   By: Karen Kays M.D.   On: 04/27/2023 15:57   CT ABDOMEN PELVIS W  CONTRAST  Result Date: 04/27/2023 CLINICAL DATA:  Septic. Lactic acidosis. Status post thoracic surgery and on ECMO EXAM: CT ABDOMEN AND PELVIS WITH CONTRAST TECHNIQUE: Multidetector CT imaging of the abdomen and pelvis was performed using the standard protocol following bolus administration of intravenous contrast. RADIATION DOSE REDUCTION: This exam was performed according to the departmental dose-optimization program which includes automated exposure control, adjustment of the mA and/or kV according to patient size and/or use of iterative reconstruction technique. CONTRAST:  75mL OMNIPAQUE IOHEXOL 350 MG/ML SOLN COMPARISON:  X-ray 04/27/2023 and older. CTA abdomen pelvis 11/30/2020. Abdominal ultrasound 12/23/2021. FINDINGS: Lower chest: There is a open wound along the anterior lower chest wall with sternal defect and placement of presumed mediastinal drains. Catheters along the right side of the heart including extending from the right atrium into the intrahepatic IVC. Significant opacities along both lower lobes with air bronchograms and pleural effusions. There is also opacity along the dependent lingula. Atelectasis versus infiltrate. Recommend follow-up. Areas node in the mediastinum. Hepatobiliary: No space-occupying liver lesion. Patent portal vein gallbladder is nondilated. Pancreas: Unremarkable. No pancreatic ductal dilatation or surrounding inflammatory changes. Spleen: Normal in size without focal abnormality. Adrenals/Urinary Tract: Adrenal glands are preserved. No enhancing renal mass or collecting system dilatation. There is tiny  low-attenuation lesion laterally along the right mid kidney, too small to completely characterize although likely a benign cysts, Bosniak 2. No specific imaging follow-up. There is delayed excretion of contrast from the kidneys. Please correlate with the patient's renal function. The ureters are nondilated and has a normal course and caliber down to the urinary bladder. The bladder has some dependent air with a Foley catheter and is underdistended. Stomach/Bowel: On this non oral contrast exam large bowel has a normal course and caliber with scattered stool. No areas of dilatation, pneumatosis. No portal venous gas. Vascular/Lymphatic: Normal caliber aorta and IVC. Mild scattered atherosclerotic calcified plaque along the aorta and branch vessels. No discrete abnormal lymph node enlargement identified in the abdomen and pelvis. Reproductive: Prostate is unremarkable. Other: Small fat containing inguinal hernias, left-greater-than-right. Anasarca diffusely. There is some mesenteric stranding with mild scattered ascites. No free intra-air. Musculoskeletal: Mild degenerative changes along the spine and pelvis. IMPRESSION: No bowel obstruction, free air. Mild ascites and mesenteric stranding. There is poor excretion of contrast from the kidneys. Please correlate with the patient's renal function. Surgical changes along the thorax with an open sternal wound. ECMO catheters in place as well as other tubes and lines. Small pleural effusions with bilateral lower lobe consolidative areas of opacity as well as some opacity in the lingula. Atelectasis versus infiltrateis possible. Please correlate with clinical presentation recommend follow-up. Electronically Signed   By: Karen Kays M.D.   On: 04/27/2023 15:55   CT HEAD WO CONTRAST ( )  Result Date: 04/27/2023 CLINICAL DATA:  Encephalopathy.  Sepsis. EXAM: CT HEAD WITHOUT CONTRAST TECHNIQUE: Contiguous axial images were obtained from the base of the skull through the  vertex without intravenous contrast. RADIATION DOSE REDUCTION: This exam was performed according to the departmental dose-optimization program which includes automated exposure control, adjustment of the mA and/or kV according to patient size and/or use of iterative reconstruction technique. COMPARISON:  Head CT 12/03/2020 and MRI 03/11/2021 FINDINGS: Brain: There is no evidence of an acute infarct, intracranial hemorrhage, mass, midline shift, or extra-axial fluid collection. The ventricles and sulci are normal. Vascular: No hyperdense vessel. Skull: No acute fracture or suspicious osseous lesion. Sinuses/Orbits: Mild-to-moderate mucosal thickening in the paranasal sinuses. Clear mastoid air  cells. Unremarkable orbits. Other: Mild left-sided scalp soft tissue swelling. IMPRESSION: No evidence of acute intracranial abnormality. Electronically Signed   By: Sebastian Ache M.D.   On: 04/27/2023 14:17     Medications:     Scheduled Medications:  Chlorhexidine Gluconate Cloth  6 each Topical Q0600   docusate  100 mg Per Tube BID   insulin aspart  0-24 Units Subcutaneous Q4H   mouth rinse  15 mL Mouth Rinse Q2H   pantoprazole (PROTONIX) IV  40 mg Intravenous QHS   polyethylene glycol  17 g Per Tube Daily   sodium chloride flush  10-40 mL Intracatheter Q12H    Infusions:  sodium chloride 10 mL/hr at 04/28/23 0800   albumin human Stopped (04/27/23 1900)   epinephrine Stopped (04/27/23 1446)   HYDROmorphone 6 mg/hr (04/28/23 0800)   meropenem (MERREM) IV Stopped (04/28/23 0256)   midazolam 8 mg/hr (04/28/23 0800)   nitroGLYCERIN Stopped (04/26/23 1627)   norepinephrine (LEVOPHED) Adult infusion 4 mcg/min (04/28/23 0800)   vancomycin Stopped (04/28/23 0548)    PRN Medications: albumin human, HYDROmorphone, midazolam, morphine injection, ondansetron (ZOFRAN) IV, mouth rinse, oxyCODONE, sodium chloride flush    Patient Profile   Johnathan Anderson is a 57 y.o. 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 Complicated by VT arrest and biventricular shock (RV predominant) requiring central VA ecmo cannulation.   Assessment/Plan    1. Post-cardiotomy Cardiogenic shock VT arrest -VT arrest and shock post-op 11/20 following Bental procedure and CABG X 2 (RSVG from aortic graft to RCA and from the vain bypass to LAD) -TEE images reviewed, patient with RV standstill, likely due to ischemia and prolonged pump time.  - Back to OR 11/20 without identification of bleeding site. Cannulated on central ECMO for ongoing RV failure - Remains on central VA ECMO + NE 4 . Epi off due to acidosis (mitochondrial uncoupling) - Flows ~ 4/5 L - Hemoglobin goal 8 - Minimal pulsativity on Aline and PA cath. Mild alternans on A-line tracing - Bedside echo today LV EF < 20% RV severe HK. Small effusion - Remains critically ill. - Plan for diuresis today. Add milrinone for RV support. Can add DA as needed - Plan for washout on Monday - Hopeful to see biventricular recovery. ? If he will need Impella 5.5 to bridge  - No AC yet. Place SCDs  2. Bicuspid aortic valve/ascending aortic aneurysm -S/p Bental 11/20 as above  3. Post-op ABLA & thrombocytopenia -Has received multiple blood products - hgb 7.6 -> transfuse - PLTs 91k  4. Hypernatremia - give FW  5. Acute post-op hypoxic respiratory failure - vent per CCM   CRITICAL CARE Performed by: Arvilla Meres  Total critical care time: 55 minutes  Critical care time was exclusive of separately billable procedures and treating other patients.  Critical care was necessary to treat or prevent imminent or life-threatening deterioration.  Critical care was time spent personally by me (independent of midlevel providers or residents) on the following activities: development of treatment plan with patient and/or surrogate as well as nursing, discussions with  consultants, evaluation of patient's response to treatment, examination of patient, obtaining history from patient or surrogate, ordering and performing treatments and interventions, ordering and review of laboratory studies, ordering and review of radiographic studies, pulse oximetry and re-evaluation of patient's condition.    Length of Stay: 2  Arvilla Meres, MD  04/28/2023, 8:22 AM  Advanced  Heart Failure Team Pager [phone] (M-F; 7a - 5p)  Please contact [facility] for night-coverage after hours (5p -7a ) and weekends on [email or web address]

## 2023-04-28 NOTE — Progress Notes (Signed)
ECMO NOTE:   Indication: Cardiogenic shock due to RV faliure   Initial cannulation date: 11/20   ECMO type: [facility] ECMO   Dual lumen inflow/return cannula:   1) Two stage venous return cannula in the right atrium 1) 20Fr Sarns cannula in the aorta    ECMO events:   - Initial cannulation 04/26/23    Daily data:   Flow 4.37L RPM 3600 Sweep  2L    Plan:  Continue ECMO support goal 4 to 4.5L Transfuse today Continue support over the weekend   Discussed in multidisciplinary fashion on ECMO rounds with CCM, Cardiology, ECMO coordinator/specialist, RT, PharmD and nursing staff all present.    Arvilla Meres, MD  8:21 AM

## 2023-04-28 NOTE — Progress Notes (Signed)
[address]       ,[place] [address]             [phone]      1 Day Post-Op  Procedure(s) (LRB): EXPLORATION POST OPERATIVE OPEN HEART (N/A) TRANSESOPHAGEAL ECHOCARDIOGRAM (TEE) (N/A)   Total Length of Stay:  LOS: 2 days    SUBJECTIVE: Acidosis resolved off epi drip Becomes agitated and sedation increased   Vitals:   04/28/23 0700 04/28/23 0734  BP:    Pulse:  90  Resp: 18 18  Temp: 97.9 F (36.6 C)   SpO2:  100%    Intake/Output      11/21 0701 11/22 0700 11/22 0701 11/23 0700   I.V. (mL/kg) 1052.6 (10.5)    Blood 2565    Other     IV Piggyback 1225.9    Total Intake(mL/kg) 4843.5 (48.2)    Urine (mL/kg/hr) 3935 (1.6)    Emesis/NG output     Blood     Chest Tube 960    Total Output 4895    Net -51.5             sodium chloride 10 mL/hr at 04/28/23 0700   albumin human Stopped (04/27/23 1900)   epinephrine Stopped (04/27/23 1446)   HYDROmorphone 6 mg/hr (04/28/23 0700)   meropenem (MERREM) IV Stopped (04/28/23 0256)   midazolam 8 mg/hr (04/28/23 0700)   nitroGLYCERIN Stopped (04/26/23 1627)   norepinephrine (LEVOPHED) Adult infusion 4 mcg/min (04/28/23 0700)   vancomycin Stopped (04/28/23 0548)    CBC    Component Value Date/Time   WBC 6.9 04/28/2023 0304   RBC 2.67 (L) 04/28/2023 0304   HGB 7.5 (L) 04/28/2023 0506   HGB 15.3 12/26/2022 1036   HCT 22.0 (L) 04/28/2023 0506   HCT 45.2 12/26/2022 1036   PLT 91 (L) 04/28/2023 0304   PLT 157 12/26/2022 1036   MCV 82.4 04/28/2023 0304   MCV 88 12/26/2022 1036   MCH 28.5 04/28/2023 0304   MCHC 34.5 04/28/2023 0304   RDW 15.5 04/28/2023 0304   RDW 13.3 12/26/2022 1036   LYMPHSABS 3.9 12/03/2020 2006   MONOABS 0.7 12/03/2020 2006   EOSABS 0.1 12/03/2020 2006   BASOSABS 0.0 12/03/2020 2006   CMP     Component Value Date/Time   NA 155 (H) 04/28/2023 0506   NA 140 12/26/2022 1036   K 3.9 04/28/2023 0506   CL 115 (H) 04/28/2023 0304   CO2 32 04/28/2023 0304    GLUCOSE 127 (H) 04/28/2023 0304   BUN 15 04/28/2023 0304   BUN 21 12/26/2022 1036   CREATININE 1.43 (H) 04/28/2023 0304   CALCIUM 7.9 (L) 04/28/2023 0304   PROT 4.0 (L) 04/28/2023 0304   PROT 7.4 09/13/2021 0801   ALBUMIN 2.5 (L) 04/28/2023 0304   ALBUMIN 4.5 09/13/2021 0801   AST 271 (H) 04/28/2023 0304   ALT 50 (H) 04/28/2023 0304   ALKPHOS 25 (L) 04/28/2023 0304   BILITOT 3.0 (H) 04/28/2023 0304   BILITOT 0.5 09/13/2021 0801   GFRNONAA 56 (L) 04/28/2023 0304   ABG    Component Value Date/Time   PHART 7.458 (H) 04/28/2023 0506   PCO2ART 44.4 04/28/2023 0506   PO2ART 118 (H) 04/28/2023 0506   HCO3 31.5 (H) 04/28/2023 0506   TCO2 33 (H) 04/28/2023 0506   ACIDBASEDEF 6.0 (H) 04/27/2023 1436   O2SAT 99 04/28/2023 0506   CBG (last 3)  Recent Labs    04/27/23 1537 04/27/23 2005  04/28/23 0001  GLUCAP 124* 117* 119*  Exam Sedated Peripherally warm Dressing not saturated    ASSESSMENT: POD #2 SP Aortic root replacement/Cabg/ ECMO Stabilizing on ECMO with resolution of acidosis. Maintaining good flows and on norepi which appears increased due to increased sedation AHF to manage timing of weaning of flows Hopefully able to decannulate beginning of next week Start tube feeds Diurese Transfuse PRBC today    Eugenio Hoes, MD 04/28/2023

## 2023-04-29 ENCOUNTER — Inpatient Hospital Stay (HOSPITAL_COMMUNITY): Payer: Commercial Managed Care - HMO

## 2023-04-29 DIAGNOSIS — R739 Hyperglycemia, unspecified: Secondary | ICD-10-CM | POA: Diagnosis not present

## 2023-04-29 DIAGNOSIS — J9601 Acute respiratory failure with hypoxia: Secondary | ICD-10-CM | POA: Diagnosis not present

## 2023-04-29 DIAGNOSIS — D689 Coagulation defect, unspecified: Secondary | ICD-10-CM | POA: Diagnosis not present

## 2023-04-29 DIAGNOSIS — R570 Cardiogenic shock: Secondary | ICD-10-CM | POA: Diagnosis not present

## 2023-04-29 LAB — POCT I-STAT 7, (LYTES, BLD GAS, ICA,H+H)
Acid-Base Excess: 8 mmol/L — ABNORMAL HIGH (ref 0.0–2.0)
Acid-Base Excess: 8 mmol/L — ABNORMAL HIGH (ref 0.0–2.0)
Acid-Base Excess: 8 mmol/L — ABNORMAL HIGH (ref 0.0–2.0)
Acid-Base Excess: 9 mmol/L — ABNORMAL HIGH (ref 0.0–2.0)
Acid-Base Excess: 9 mmol/L — ABNORMAL HIGH (ref 0.0–2.0)
Acid-Base Excess: 8 mmol/L — ABNORMAL HIGH (ref 0.0–2.0)
Acid-base deficit: 2 mmol/L (ref 0.0–2.0)
Bicarbonate: 34.3 mmol/L — ABNORMAL HIGH (ref 20.0–28.0)
Bicarbonate: 34.6 mmol/L — ABNORMAL HIGH (ref 20.0–28.0)
Bicarbonate: 34.3 mmol/L — ABNORMAL HIGH (ref 20.0–28.0)
Bicarbonate: 34.9 mmol/L — ABNORMAL HIGH (ref 20.0–28.0)
Bicarbonate: 35.7 mmol/L — ABNORMAL HIGH (ref 20.0–28.0)
Bicarbonate: 35.3 mmol/L — ABNORMAL HIGH (ref 20.0–28.0)
Bicarbonate: 23.2 mmol/L (ref 20.0–28.0)
Calcium, Ion: 1.18 mmol/L (ref 1.15–1.40)
Calcium, Ion: 1.22 mmol/L (ref 1.15–1.40)
Calcium, Ion: 1.21 mmol/L (ref 1.15–1.40)
Calcium, Ion: 1.19 mmol/L (ref 1.15–1.40)
Calcium, Ion: 1.18 mmol/L (ref 1.15–1.40)
Calcium, Ion: 1.24 mmol/L (ref 1.15–1.40)
Calcium, Ion: 0.98 mmol/L — ABNORMAL LOW (ref 1.15–1.40)
HCT: 27 % — ABNORMAL LOW (ref 39.0–52.0)
HCT: 26 % — ABNORMAL LOW (ref 39.0–52.0)
HCT: 27 % — ABNORMAL LOW (ref 39.0–52.0)
HCT: 26 % — ABNORMAL LOW (ref 39.0–52.0)
HCT: 26 % — ABNORMAL LOW (ref 39.0–52.0)
HCT: 28 % — ABNORMAL LOW (ref 39.0–52.0)
HCT: 19 % — ABNORMAL LOW (ref 39.0–52.0)
Hemoglobin: 9.2 g/dL — ABNORMAL LOW (ref 13.0–17.0)
Hemoglobin: 8.8 g/dL — ABNORMAL LOW (ref 13.0–17.0)
Hemoglobin: 9.2 g/dL — ABNORMAL LOW (ref 13.0–17.0)
Hemoglobin: 8.8 g/dL — ABNORMAL LOW (ref 13.0–17.0)
Hemoglobin: 8.8 g/dL — ABNORMAL LOW (ref 13.0–17.0)
Hemoglobin: 9.5 g/dL — ABNORMAL LOW (ref 13.0–17.0)
Hemoglobin: 6.5 g/dL — CL (ref 13.0–17.0)
O2 Saturation: 99 %
O2 Saturation: 98 %
O2 Saturation: 97 %
O2 Saturation: 98 %
O2 Saturation: 98 %
O2 Saturation: 95 %
O2 Saturation: 100 %
Patient temperature: 36.7
Patient temperature: 36.6
Patient temperature: 36.7
Patient temperature: 36.8
Patient temperature: 36.8
Patient temperature: 37
Patient temperature: 36.9
Potassium: 4.2 mmol/L (ref 3.5–5.1)
Potassium: 4.1 mmol/L (ref 3.5–5.1)
Potassium: 3.9 mmol/L (ref 3.5–5.1)
Potassium: 3.6 mmol/L (ref 3.5–5.1)
Potassium: 4.1 mmol/L (ref 3.5–5.1)
Potassium: 3.9 mmol/L (ref 3.5–5.1)
Potassium: 2.7 mmol/L — CL (ref 3.5–5.1)
Sodium: 152 mmol/L — ABNORMAL HIGH (ref 135–145)
Sodium: 152 mmol/L — ABNORMAL HIGH (ref 135–145)
Sodium: 150 mmol/L — ABNORMAL HIGH (ref 135–145)
Sodium: 150 mmol/L — ABNORMAL HIGH (ref 135–145)
Sodium: 148 mmol/L — ABNORMAL HIGH (ref 135–145)
Sodium: 149 mmol/L — ABNORMAL HIGH (ref 135–145)
Sodium: 154 mmol/L — ABNORMAL HIGH (ref 135–145)
TCO2: 36 mmol/L — ABNORMAL HIGH (ref 22–32)
TCO2: 36 mmol/L — ABNORMAL HIGH (ref 22–32)
TCO2: 36 mmol/L — ABNORMAL HIGH (ref 22–32)
TCO2: 37 mmol/L — ABNORMAL HIGH (ref 22–32)
TCO2: 37 mmol/L — ABNORMAL HIGH (ref 22–32)
TCO2: 37 mmol/L — ABNORMAL HIGH (ref 22–32)
TCO2: 24 mmol/L (ref 22–32)
pCO2 arterial: 55.4 mm[Hg] — ABNORMAL HIGH (ref 32–48)
pCO2 arterial: 58.6 mm[Hg] — ABNORMAL HIGH (ref 32–48)
pCO2 arterial: 57 mm[Hg] — ABNORMAL HIGH (ref 32–48)
pCO2 arterial: 55.8 mm[Hg] — ABNORMAL HIGH (ref 32–48)
pCO2 arterial: 58.3 mm[Hg] — ABNORMAL HIGH (ref 32–48)
pCO2 arterial: 66 mm[Hg] (ref 32–48)
pCO2 arterial: 37.6 mm[Hg] (ref 32–48)
pH, Arterial: 7.399 (ref 7.35–7.45)
pH, Arterial: 7.378 (ref 7.35–7.45)
pH, Arterial: 7.387 (ref 7.35–7.45)
pH, Arterial: 7.403 (ref 7.35–7.45)
pH, Arterial: 7.394 (ref 7.35–7.45)
pH, Arterial: 7.336 — ABNORMAL LOW (ref 7.35–7.45)
pH, Arterial: 7.397 (ref 7.35–7.45)
pO2, Arterial: 134 mm[Hg] — ABNORMAL HIGH (ref 83–108)
pO2, Arterial: 114 mm[Hg] — ABNORMAL HIGH (ref 83–108)
pO2, Arterial: 96 mm[Hg] (ref 83–108)
pO2, Arterial: 111 mm[Hg] — ABNORMAL HIGH (ref 83–108)
pO2, Arterial: 116 mm[Hg] — ABNORMAL HIGH (ref 83–108)
pO2, Arterial: 86 mm[Hg] (ref 83–108)
pO2, Arterial: 268 mm[Hg] — ABNORMAL HIGH (ref 83–108)

## 2023-04-29 LAB — GLOBAL TEG PANEL
CFF Max Amplitude: 26.4 mm (ref 15–32)
CK with Heparinase (R): 6.8 min (ref 4.3–8.3)
Citrated Functional Fibrinogen: 481.8 mg/dL (ref 278–581)
Citrated Kaolin (K): 1.3 min (ref 0.8–2.1)
Citrated Kaolin (MA): 63 mm (ref 52–69)
Citrated Kaolin (R): 7.4 min (ref 4.6–9.1)
Citrated Kaolin Angle: 72.8 deg (ref 63–78)
Citrated Rapid TEG (MA): 60.9 mm (ref 52–70)

## 2023-04-29 LAB — TYPE AND SCREEN
ABO/RH(D): A POS
Antibody Screen: NEGATIVE
Unit division: 0
Unit division: 0
Unit division: 0
Unit division: 0
Unit division: 0
Unit division: 0
Unit division: 0
Unit division: 0
Unit division: 0
Unit division: 0
Unit division: 0
Unit division: 0
Unit division: 0
Unit division: 0
Unit division: 0
Unit division: 0
Unit division: 0
Unit division: 0
Unit division: 0
Unit division: 0
Unit division: 0
Unit division: 0
Unit division: 0
Unit division: 0
Unit division: 0
Unit division: 0

## 2023-04-29 LAB — BASIC METABOLIC PANEL
Anion gap: 7 (ref 5–15)
Anion gap: 9 (ref 5–15)
Anion gap: 7 (ref 5–15)
Anion gap: 8 (ref 5–15)
BUN: 21 mg/dL — ABNORMAL HIGH (ref 6–20)
BUN: 23 mg/dL — ABNORMAL HIGH (ref 6–20)
BUN: 26 mg/dL — ABNORMAL HIGH (ref 6–20)
BUN: 28 mg/dL — ABNORMAL HIGH (ref 6–20)
CO2: 33 mmol/L — ABNORMAL HIGH (ref 22–32)
CO2: 32 mmol/L (ref 22–32)
CO2: 34 mmol/L — ABNORMAL HIGH (ref 22–32)
CO2: 33 mmol/L — ABNORMAL HIGH (ref 22–32)
Calcium: 8.4 mg/dL — ABNORMAL LOW (ref 8.9–10.3)
Calcium: 8.4 mg/dL — ABNORMAL LOW (ref 8.9–10.3)
Calcium: 8.6 mg/dL — ABNORMAL LOW (ref 8.9–10.3)
Calcium: 8.5 mg/dL — ABNORMAL LOW (ref 8.9–10.3)
Chloride: 112 mmol/L — ABNORMAL HIGH (ref 98–111)
Chloride: 110 mmol/L (ref 98–111)
Chloride: 108 mmol/L (ref 98–111)
Chloride: 106 mmol/L (ref 98–111)
Creatinine, Ser: 1.44 mg/dL — ABNORMAL HIGH (ref 0.61–1.24)
Creatinine, Ser: 1.57 mg/dL — ABNORMAL HIGH (ref 0.61–1.24)
Creatinine, Ser: 1.45 mg/dL — ABNORMAL HIGH (ref 0.61–1.24)
Creatinine, Ser: 1.45 mg/dL — ABNORMAL HIGH (ref 0.61–1.24)
GFR, Estimated: 56 mL/min — ABNORMAL LOW (ref >60)
GFR, Estimated: 50 mL/min — ABNORMAL LOW (ref >60)
GFR, Estimated: 56 mL/min — ABNORMAL LOW (ref >60)
GFR, Estimated: 56 mL/min — ABNORMAL LOW (ref >60)
Glucose, Bld: 153 mg/dL — ABNORMAL HIGH (ref 70–99)
Glucose, Bld: 161 mg/dL — ABNORMAL HIGH (ref 70–99)
Glucose, Bld: 138 mg/dL — ABNORMAL HIGH (ref 70–99)
Glucose, Bld: 140 mg/dL — ABNORMAL HIGH (ref 70–99)
Potassium: 3.9 mmol/L (ref 3.5–5.1)
Potassium: 4.2 mmol/L (ref 3.5–5.1)
Potassium: 3.7 mmol/L (ref 3.5–5.1)
Potassium: 4.2 mmol/L (ref 3.5–5.1)
Sodium: 152 mmol/L — ABNORMAL HIGH (ref 135–145)
Sodium: 151 mmol/L — ABNORMAL HIGH (ref 135–145)
Sodium: 149 mmol/L — ABNORMAL HIGH (ref 135–145)
Sodium: 147 mmol/L — ABNORMAL HIGH (ref 135–145)

## 2023-04-29 LAB — BPAM RBC
Blood Product Expiration Date: 202411282359
Blood Product Expiration Date: 202411282359
Blood Product Expiration Date: 202411292359
Blood Product Expiration Date: 202412012359
Blood Product Expiration Date: 202412022359
Blood Product Expiration Date: 202412022359
Blood Product Expiration Date: 202412132359
Blood Product Expiration Date: 202412132359
Blood Product Expiration Date: 202412142359
Blood Product Expiration Date: 202412142359
Blood Product Expiration Date: 202412142359
Blood Product Expiration Date: 202412142359
Blood Product Expiration Date: 202412152359
Blood Product Expiration Date: 202412152359
Blood Product Expiration Date: 202412152359
Blood Product Expiration Date: 202412152359
Blood Product Expiration Date: 202412152359
Blood Product Expiration Date: 202412172359
Blood Product Expiration Date: 202412172359
Blood Product Expiration Date: 202412172359
Blood Product Expiration Date: 202412172359
Blood Product Expiration Date: 202412172359
Blood Product Expiration Date: 202412182359
Blood Product Expiration Date: 202412182359
Blood Product Expiration Date: 202412192359
Blood Product Expiration Date: 202412192359
ISSUE DATE / TIME: 202411201716
ISSUE DATE / TIME: 202411201716
ISSUE DATE / TIME: 202411201732
ISSUE DATE / TIME: 202411201732
ISSUE DATE / TIME: 202411201732
ISSUE DATE / TIME: 202411201732
ISSUE DATE / TIME: 202411201732
ISSUE DATE / TIME: 202411201732
ISSUE DATE / TIME: 202411201732
ISSUE DATE / TIME: 202411201750
ISSUE DATE / TIME: 202411201750
ISSUE DATE / TIME: 202411201750
ISSUE DATE / TIME: 202411201750
ISSUE DATE / TIME: 202411202228
ISSUE DATE / TIME: 202411210934
ISSUE DATE / TIME: 202411211320
ISSUE DATE / TIME: 202411211320
ISSUE DATE / TIME: 202411211402
ISSUE DATE / TIME: 202411211402
ISSUE DATE / TIME: 202411211720
ISSUE DATE / TIME: 202411212255
ISSUE DATE / TIME: 202411220726
Unit Type and Rh: 5100
Unit Type and Rh: 5100
Unit Type and Rh: 5100
Unit Type and Rh: 5100
Unit Type and Rh: 5100
Unit Type and Rh: 5100
Unit Type and Rh: 5100
Unit Type and Rh: 5100
Unit Type and Rh: 6200
Unit Type and Rh: 6200
Unit Type and Rh: 6200
Unit Type and Rh: 6200
Unit Type and Rh: 6200
Unit Type and Rh: 6200
Unit Type and Rh: 6200
Unit Type and Rh: 6200
Unit Type and Rh: 6200
Unit Type and Rh: 6200
Unit Type and Rh: 6200
Unit Type and Rh: 6200
Unit Type and Rh: 6200
Unit Type and Rh: 6200
Unit Type and Rh: 6200
Unit Type and Rh: 6200
Unit Type and Rh: 6200
Unit Type and Rh: 6200

## 2023-04-29 LAB — CBC
HCT: 30.1 % — ABNORMAL LOW (ref 39.0–52.0)
HCT: 28.2 % — ABNORMAL LOW (ref 39.0–52.0)
HCT: 28.5 % — ABNORMAL LOW (ref 39.0–52.0)
Hemoglobin: 10.3 g/dL — ABNORMAL LOW (ref 13.0–17.0)
Hemoglobin: 9.4 g/dL — ABNORMAL LOW (ref 13.0–17.0)
Hemoglobin: 9.5 g/dL — ABNORMAL LOW (ref 13.0–17.0)
MCH: 29.6 pg (ref 26.0–34.0)
MCH: 29 pg (ref 26.0–34.0)
MCH: 29.6 pg (ref 26.0–34.0)
MCHC: 34.2 g/dL (ref 30.0–36.0)
MCHC: 33.3 g/dL (ref 30.0–36.0)
MCHC: 33.3 g/dL (ref 30.0–36.0)
MCV: 86.5 fL (ref 80.0–100.0)
MCV: 87 fL (ref 80.0–100.0)
MCV: 88.8 fL (ref 80.0–100.0)
Platelets: 88 10*3/uL — ABNORMAL LOW (ref 150–400)
Platelets: 81 10*3/uL — ABNORMAL LOW (ref 150–400)
Platelets: 71 10*3/uL — ABNORMAL LOW (ref 150–400)
RBC: 3.48 MIL/uL — ABNORMAL LOW (ref 4.22–5.81)
RBC: 3.24 MIL/uL — ABNORMAL LOW (ref 4.22–5.81)
RBC: 3.21 MIL/uL — ABNORMAL LOW (ref 4.22–5.81)
RDW: 15.9 % — ABNORMAL HIGH (ref 11.5–15.5)
RDW: 16 % — ABNORMAL HIGH (ref 11.5–15.5)
RDW: 16 % — ABNORMAL HIGH (ref 11.5–15.5)
WBC: 10.4 10*3/uL (ref 4.0–10.5)
WBC: 9.5 10*3/uL (ref 4.0–10.5)
WBC: 8.4 10*3/uL (ref 4.0–10.5)
nRBC: 0 % (ref 0.0–0.2)
nRBC: 0.2 % (ref 0.0–0.2)
nRBC: 0.4 % — ABNORMAL HIGH (ref 0.0–0.2)

## 2023-04-29 LAB — HEPATIC FUNCTION PANEL
ALT: 54 U/L — ABNORMAL HIGH (ref 0–44)
AST: 170 U/L — ABNORMAL HIGH (ref 15–41)
Albumin: 3.3 g/dL — ABNORMAL LOW (ref 3.5–5.0)
Alkaline Phosphatase: 36 U/L — ABNORMAL LOW (ref 38–126)
Bilirubin, Direct: 2.6 mg/dL — ABNORMAL HIGH (ref 0.0–0.2)
Indirect Bilirubin: 2.2 mg/dL — ABNORMAL HIGH (ref 0.3–0.9)
Total Bilirubin: 4.8 mg/dL — ABNORMAL HIGH (ref <1.2)
Total Protein: 5.4 g/dL — ABNORMAL LOW (ref 6.5–8.1)

## 2023-04-29 LAB — CG4 I-STAT (LACTIC ACID): Lactic Acid, Venous: 0.8 mmol/L (ref 0.5–1.9)

## 2023-04-29 LAB — GLUCOSE, CAPILLARY
Glucose-Capillary: 137 mg/dL — ABNORMAL HIGH (ref 70–99)
Glucose-Capillary: 138 mg/dL — ABNORMAL HIGH (ref 70–99)
Glucose-Capillary: 142 mg/dL — ABNORMAL HIGH (ref 70–99)
Glucose-Capillary: 148 mg/dL — ABNORMAL HIGH (ref 70–99)
Glucose-Capillary: 151 mg/dL — ABNORMAL HIGH (ref 70–99)
Glucose-Capillary: 163 mg/dL — ABNORMAL HIGH (ref 70–99)

## 2023-04-29 LAB — COOXEMETRY PANEL
Carboxyhemoglobin: 1.4 % (ref 0.5–1.5)
Methemoglobin: <0.7 % (ref 0.0–1.5)
O2 Saturation: 64.9 %
Total hemoglobin: 9.4 g/dL — ABNORMAL LOW (ref 12.0–16.0)

## 2023-04-29 LAB — MAGNESIUM
Magnesium: 2.4 mg/dL (ref 1.7–2.4)
Magnesium: 2.3 mg/dL (ref 1.7–2.4)

## 2023-04-29 LAB — APTT
aPTT: 38 s — ABNORMAL HIGH (ref 24–36)
aPTT: 38 s — ABNORMAL HIGH (ref 24–36)
aPTT: 38 s — ABNORMAL HIGH (ref 24–36)
aPTT: 38 s — ABNORMAL HIGH (ref 24–36)

## 2023-04-29 LAB — PROTIME-INR
INR: 1.4 — ABNORMAL HIGH (ref 0.8–1.2)
Prothrombin Time: 17 s — ABNORMAL HIGH (ref 11.4–15.2)

## 2023-04-29 LAB — LACTATE DEHYDROGENASE: LDH: 674 U/L — ABNORMAL HIGH (ref 98–192)

## 2023-04-29 LAB — FIBRINOGEN: Fibrinogen: 641 mg/dL — ABNORMAL HIGH (ref 210–475)

## 2023-04-29 LAB — PHOSPHORUS
Phosphorus: 3.4 mg/dL (ref 2.5–4.6)
Phosphorus: 2.8 mg/dL (ref 2.5–4.6)

## 2023-04-29 MED ORDER — BACLOFEN 10 MG PO TABS
10.0000 mg | ORAL_TABLET | Freq: Three times a day (TID) | ORAL | Status: DC
  Filled 2023-04-29: qty 1

## 2023-04-29 MED ORDER — BACLOFEN 10 MG PO TABS
10.0000 mg | ORAL_TABLET | Freq: Three times a day (TID) | ORAL | Status: DC
Start: 2023-04-30 — End: 2023-04-30
  Administered 2023-04-29: 10 mg
  Filled 2023-04-29: qty 1

## 2023-04-29 MED ORDER — POTASSIUM CHLORIDE 20 MEQ PO PACK
40.0000 meq | PACK | Freq: Once | ORAL | Status: AC
  Administered 2023-04-29: 40 meq
  Filled 2023-04-29: qty 2

## 2023-04-29 MED ORDER — FREE WATER
200.0000 mL | Status: DC
  Administered 2023-04-29 – 2023-05-01 (×23): 200 mL

## 2023-04-29 MED ORDER — ROCURONIUM BROMIDE 10 MG/ML (PF) SYRINGE
100.0000 mg | PREFILLED_SYRINGE | INTRAVENOUS | Status: DC | PRN
  Administered 2023-04-30 – 2023-05-01 (×8): 100 mg via INTRAVENOUS
  Filled 2023-04-29 (×10): qty 10

## 2023-04-29 NOTE — Progress Notes (Addendum)
ECMO NOTE:   Indication: Cardiogenic shock due to RV faliure   Initial cannulation date: 11/20   ECMO type: [facility] ECMO   Dual lumen inflow/return cannula:   1) Two stage venous return cannula in the right atrium 1) 20Fr Sarns cannula in the aorta    ECMO events:   - Initial cannulation 04/26/23    Daily data:   Flow 4.15L RPM 3570 Sweep  3L Pint 301 Pven -43  Streaking clot from 12 down    Plan:  Continue ECMO support goal 4 to 4.5L Had some clots pulled out of pigtail yesterday with elevated Pint but improved today No AC yet Continue full support D/w [facility] for possible transplant eval early next week   Discussed in multidisciplinary fashion on ECMO rounds with CCM, Cardiology, ECMO coordinator/specialist, RT, PharmD and nursing staff all present.    Arvilla Meres, MD  10:45 AM

## 2023-04-29 NOTE — Progress Notes (Signed)
NAME:  Johnathan Anderson, MRN:  [id number], DOB:  03/23/1966, LOS: 3 ADMISSION DATE:  04/26/2023, CONSULTATION DATE:  04/26/23 REFERRING MD:  Dr. Leafy Ro, CHIEF COMPLAINT:  postop   History of Present Illness:  Pt encephalopathic, therefore HPI obtained from EMR.    3 yoM with hx of bicuspid AV w/ AS, aortic aneurysm, HTN, HLD GERD, and former smoker who was admitted for elective AVR.  Pt followed for progressive bicuspid aortic stenosis and moderate ascending aortic aneurysm.  Asymptomatic.  Recent echo showing normal LV function and mean gradient of 39 mmHg.  No evidence of CAD or PHTN on preop workup.  Pt elected to repair issue now prior to becoming symptomatic.   Underwent elective bioprosthetic AVR with bental procedure complicated by possible cardioplegia vs coronary insufficiency coming off bypass requiring CABG x 2, RSVG from aortic graft to RCA and LAD and remains paced.   EBL 1250 Pump time 1005- 1428 S/p FFP x2, plts, cell saver 693, UOP 1100, paralytics not reversed, given methadone   ACT prior to ICU, 145 after second protamine Arrived on epi  Pertinent  Medical History  Bicuspid AV w/ AS, aortic aneurysm, HTN, GERD, HLD, vertigo, former smoker. Quit smoking 20 years ago.   Significant Hospital Events: Including procedures, antibiotic start and stop dates in addition to other pertinent events   11/20 AVR/ bental procedure & CABG X 2, VT postop, back to OR, centrally cannulated for ecmo 11/21 washout in OR  Interim History / Subjective:  Overnight had frequent ectopy, had multiple changes to pacing modes. Currently in DDD. Tolerating TF.  Objective   Blood pressure (!) 81/72, pulse (!) 34, temperature 98.1 F (36.7 C), resp. rate 18, height 5\' 6"  (1.676 m), weight 93 kg, SpO2 97%. PAP: (10-19)/(5-13) 13/6 CVP:  [4 mmHg-12 mmHg] 5 mmHg  Vent Mode: PRVC FiO2 (%):  [50 %] 50 % Set Rate:  [18 bmp] 18 bmp Vt Set:  [470 mL-500 mL] 500 mL PEEP:  [8 cmH20] 8 cmH20 Plateau  Pressure:  [22 cmH20-26 cmH20] 25 cmH20   Intake/Output Summary (Last 24 hours) at 04/29/2023 0707 Last data filed at 04/29/2023 0700 Gross per 24 hour  Intake 3111.47 ml  Output 8475 ml  Net -5363.53 ml   Filed Weights   04/27/23 0500 04/28/23 0500 04/29/23 0532  Weight: 98.4 kg 100.5 kg 93 kg   Examination: General: critically ill appearing man lying in bed in NAD HEENT: Taylor/AT, eyes anicteric Neuro: RASS -5, PERRL CV: S1S2, RRR. Minimal chest tube output this morning. No increase in bleeding in chest- dressing looks similar. Centrally cannulated.  PULM:  breathing comfortably on MV, Pplat 26 on Vt 500.  GI: soft, hypoactive bowel sounds  Extremities: +edema, leg incisions healing well Skin: warm, dry, no rashes.   7.38/59/114/35 Na+ 151 BUN 23 Cr 1.57 AST 170 ALT 54 T bili 4.8 LA 0.8 WBC 9.5 H/H 9.4/28.2 Platelets 81  CXR personally reviewed> cannnot see termination of ETT, central ECMO cannulas, swan. Minimal R dependent effusion  LDH 674 Ptt  38 Fibrinogen 641  Vent PRVC 18/500/8/50%  Pplat 26 VA ECMO 3570 RPM> 4.15L  , 3L sweep, 80% P ven -45 P int 300 Delta P 22    Resolved Hospital Problem list    Assessment & Plan:   Bicuspid AV with severe AS and ascending aortic aneurysm s/p AVR/ Bental procedure) complicated by post-op coagulopathy and bleeding, VT arrest, acute RV failure and cardiogenic shock requiring VA ECMO cannulation.  S/p CABG  x 2 (RSVG from aortic graft to RCA and LAD via right and left greater saphenous vein harvest)  Acute biventricular failure VT cardiac arrest -VA ECMO, centrally cannulated. Hold AC with open chest. -Hold asprin, statin -open chest precautions; has significant chugging with any coughing. Needs rocuronium PRN for any significant turn to prevent chugging and hemodynamic changes. -con't swan -dopamine , NE 6. Goal MAP >70 -con't lasix gtt 4mg /h  Severe post-op consumptive coagulopathy & thrombocytopenia ABLA-  stable today Consumptive thrombocytopenia -holding AC -holding aspirin  Acute respiratory failure with hypoxia requiring MV; increasing pulmonary edema with increased Pplat> now on slightly lower Vt/ -LTVV, meeting Pplat goal <30 -con't PEEP 8, titrate FiO2 as needed for oxygenation. Circuit as blender on 80% -PAD protocol; goal RASS -4 to -5 and no coughing -VAP prevention protocol -not stable for SAT & SBT  AKI, expected with post-op hypotension -maintain adequate perfusion -lasix -renally dose meds, avoid nephrotoxic meds  HTN HLD -con't holding PTA meds  GERD -PPI, trickle TF  Hyperglycemia -SSI PRN -goal BG 140-180  Hypernatremia -increase FWF  Shock liver -maintain adequate perfusion, diuresis to prevent passive congestion  History of tobacco abuse, quit 20 years ago. No baseline COPD.  -recommend OP lung cancer screening referral at discharge  Wife updated at bedside.   Best Practice (right click and "Reselect all SmartList Selections" daily)   Diet/type: tubefeeds DVT prophylaxis: SCD GI prophylaxis: PPI Lines: Central line and Arterial Line Foley:  Yes, and it is still needed Code Status:  full code Last date of multidisciplinary goals of care discussion [per primary team]  Labs   CBC: Recent Labs  Lab 04/28/23 0304 04/28/23 0506 04/28/23 1045 04/28/23 1056 04/28/23 1655 04/28/23 1700 04/28/23 2323 04/28/23 2328 04/29/23 0125 04/29/23 0420 04/29/23 0605  WBC 6.9  --  8.7  --  10.1  --  10.4  --   --  9.5  --   HGB 7.6*   < > 8.7*   < > 9.6*   < > 10.3* 9.5* 9.2* 9.4* 8.8*  HCT 22.0*   < > 25.6*   < > 28.3*   < > 30.1* 28.0* 27.0* 28.2* 26.0*  MCV 82.4  --  84.2  --  85.0  --  86.5  --   --  87.0  --   PLT 91*  --  90*  --  90*  --  88*  --   --  81*  --    < > = values in this interval not displayed.    Basic Metabolic Panel: Recent Labs  Lab 04/27/23 0102 04/27/23 0359 04/27/23 1553 04/27/23 1642 04/28/23 0304 04/28/23 0506  04/28/23 1655 04/28/23 1700 04/28/23 2323 04/28/23 2328 04/29/23 0125 04/29/23 0420 04/29/23 0605  NA 146*   < > 153*   < > 155*   < > 154*   < > 152* 152* 152* 151* 152*  K 4.0   < > 3.6   < > 4.0   < > 4.0   < > 3.9 3.8 4.2 4.2 4.1  CL 112*   < > 111  --  115*  --  114*  --  112*  --   --  110  --   CO2 27  --  26  --  32  --  31  --  33*  --   --  32  --   GLUCOSE 113*   < > 189*  --  127*  --  139*  --  153*  --   --  161*  --   BUN 13   < > 14  --  15  --  16  --  21*  --   --  23*  --   CREATININE 1.37*   < > 1.86*  --  1.43*  --  1.54*  --  1.44*  --   --  1.57*  --   CALCIUM 7.9*  --  7.9*  --  7.9*  --  8.4*  --  8.4*  --   --  8.4*  --   MG 1.8  --  1.7  --  2.6*  --   --   --  2.4  --   --  2.3  --   PHOS  --   --   --   --   --   --   --   --  3.4  --   --  2.8  --    < > = values in this interval not displayed.   GFR: Estimated Creatinine Clearance: 54.1 mL/min (A) (by C-G formula based on SCr of 1.57 mg/dL (H)). Recent Labs  Lab 04/27/23 2005 04/28/23 0003 04/28/23 0304 04/28/23 0510 04/28/23 1045 04/28/23 1655 04/28/23 2323 04/29/23 0420 04/29/23 0620  WBC  --   --    < >  --  8.7 10.1 10.4 9.5  --   LATICACIDVEN 3.5* 1.5  --  0.9  --   --   --   --  0.8   < > = values in this interval not displayed.    Liver Function Tests: Recent Labs  Lab 04/24/23 1057 04/26/23 2104 04/27/23 0102 04/28/23 0304 04/29/23 0420  AST 20 370* 393* 271* 170*  ALT 22 67* 65* 50* 54*  ALKPHOS 48 16* 10* 25* 36*  BILITOT 0.8 2.0* 2.5* 3.0* 4.8*  PROT 7.4 3.1* 3.5* 4.0* 5.4*  ALBUMIN 4.0 2.3* 2.8* 2.5* 3.3*   Critical care time:      This patient is critically ill with multiple organ system failure which requires frequent high complexity decision making, assessment, support, evaluation, and titration of therapies. This was completed through the application of advanced monitoring technologies and extensive interpretation of multiple databases. During this encounter  critical care time was devoted to patient care services described in this note for 80 minutes.  Steffanie Dunn, DO 04/29/23 9:34 AM [facility]  For contact information, see Amion. If no response to pager, please call PCCM consult pager. After hours, 7PM- 7AM, please call Elink.

## 2023-04-29 NOTE — Progress Notes (Signed)
2 Days Post-Op Procedure(s) (LRB): EXPLORATION POST OPERATIVE OPEN HEART (N/A) TRANSESOPHAGEAL ECHOCARDIOGRAM (TEE) (N/A) Subjective: Intubated, sedated.  Hemodynamic deterioration with movement  Objective: Vital signs in last 24 hours: Temp:  [96.6 F (35.9 C)-98.2 F (36.8 C)] 97.9 F (36.6 C) (11/23 0900) Pulse Rate:  [27-120] 47 (11/23 0900) Cardiac Rhythm: A-V Sequential paced (11/23 0000) Resp:  [0-19] 18 (11/23 0900) BP: (81-89)/(71-75) 89/71 (11/23 0753) SpO2:  [92 %-100 %] 93 % (11/23 0900) Arterial Line BP: (70-96)/(64-78) 84/69 (11/23 0900) FiO2 (%):  [50 %] 50 % (11/23 0753) Weight:  [93 kg] 93 kg (11/23 0532)  Hemodynamic parameters for last 24 hours: PAP: (10-26)/(5-11) 26/10 CVP:  [4 mmHg-10 mmHg] 10 mmHg  Intake/Output from previous day: 11/22 0701 - 11/23 0700 In: 3111.5 [I.V.:891.7; Blood:284.2; NG/GT:545.7; IV Piggyback:1015] Out: 8475 [Urine:8315; Chest Tube:160] Intake/Output this shift: Total I/O In: 92.7 [I.V.:52.7; NG/GT:40] Out: 660 [Urine:650; Chest Tube:10]  General appearance: intubated, sedated Neurologic: sedated, moves all 4 when agitated Lungs: rhonchi bilaterally Wound: open, clean  Lab Results: Recent Labs    04/28/23 2323 04/28/23 2328 04/29/23 0420 04/29/23 0605 04/29/23 0920  WBC 10.4  --  9.5  --   --   HGB 10.3*   < > 9.4* 8.8* 9.2*  HCT 30.1*   < > 28.2* 26.0* 27.0*  PLT 88*  --  81*  --   --    < > = values in this interval not displayed.   BMET:  Recent Labs    04/28/23 2323 04/28/23 2328 04/29/23 0420 04/29/23 0605 04/29/23 0920  NA 152*   < > 151* 152* 150*  K 3.9   < > 4.2 4.1 3.9  CL 112*  --  110  --   --   CO2 33*  --  32  --   --   GLUCOSE 153*  --  161*  --   --   BUN 21*  --  23*  --   --   CREATININE 1.44*  --  1.57*  --   --   CALCIUM 8.4*  --  8.4*  --   --    < > = values in this interval not displayed.    PT/INR:  Recent Labs    04/29/23 0420  LABPROT 17.0*  INR 1.4*   ABG     Component Value Date/Time   PHART 7.387 04/29/2023 0920   HCO3 34.3 (H) 04/29/2023 0920   TCO2 36 (H) 04/29/2023 0920   ACIDBASEDEF 6.0 (H) 04/27/2023 1436   O2SAT 97 04/29/2023 0920   CBG (last 3)  Recent Labs    04/28/23 2000 04/28/23 2326 04/29/23 0419  GLUCAP 149* 148* 151*    Assessment/Plan: S/P Procedure(s) (LRB): EXPLORATION POST OPERATIVE OPEN HEART (N/A) TRANSESOPHAGEAL ECHOCARDIOGRAM (TEE) (N/A) Remains critically ill requiring full cardiac support Rounds with Dr. Chestine Spore and Bensimhon NEURO- nonfocal but requires sedation  Midazolam, hydromorphone, paralyze for turning CV- on VA ECMO with flows 4.2  No electrical activity underlying pacer  On dopamine, norepi RESP- PRVC 18/50%/ 8 PEEP  Management per Dr. Chestine Spore RENAL- creatinine relatively stable at 1.57  Lasix drip ENDO- CBG well controlled under circumstances GI- trickle feeds, not ready to advance Anemia- stable Thrombocytopenia- PLT ~ 80K, stable  Monitor Plan for washout on Monday- doubt he will be ready to decannulate, but will assess in OR   LOS: 3 days    Loreli Slot 04/29/2023

## 2023-04-29 NOTE — Progress Notes (Signed)
      [address]       [place] [address]             [phone]      Intubated, sedated  BP 94/75   Pulse 81   Temp 98.2 F (36.8 C)   Resp 18   Ht 5\' 6"  (1.676 m)   Wt 93 kg   SpO2 97%   BMI 33.09 kg/m  ECMO 4.2 L PA 14/9, CVP 7  PRVC 18/50%/8 PEEP  7.39/58/116/+9   Intake/Output Summary (Last 24 hours) at 04/29/2023 1800 Last data filed at 04/29/2023 1736 Gross per 24 hour  Intake 2946.71 ml  Output 7075 ml  Net -4128.29 ml   K 4.1 Hct 26

## 2023-04-29 NOTE — Progress Notes (Addendum)
Advanced Heart Failure Rounding Note  PCP-Cardiologist: Christell Constant, MD   Subjective:    Remains intubated/sedated. Will get agitated at time and flows will decrease  On central VA/ECMO  NE 6 Dopa 1  Diuresing well on lasix gtt  Had some clots pulled out of pigtail yesterday with elevated Pint but improved today   Flow 4.15L RPM 3570 Sweep  3L Pint 301 Pven -43  PAP: (10-26)/(5-11) 15/11 CVP:  [4 mmHg-10 mmHg] 7 mmHg  Co-ox 65%  Minimal output from CTs  Remains asystolic. AV pacing now  LA 0.9 LDH 584 -> 651 -> 674 Hgb 9.4 PLT 88k->81k Sodium 155 -> 1.51 SCr 1.43 -> 1.57 Bili 3.0 -> 4.8   Objective:   Weight Range: 93 kg Body mass index is 33.09 kg/m.   Vital Signs:   Temp:  [96.6 F (35.9 C)-98.2 F (36.8 C)] 98.1 F (36.7 C) (11/23 1000) Pulse Rate:  [27-120] 40 (11/23 1000) Resp:  [14-19] 18 (11/23 1000) BP: (81-89)/(71-75) 89/71 (11/23 0753) SpO2:  [92 %-100 %] 100 % (11/23 1000) Arterial Line BP: (70-102)/(64-78) 102/78 (11/23 1000) FiO2 (%):  [50 %] 50 % (11/23 0753) Weight:  [93 kg] 93 kg (11/23 0532) Last BM Date :  (PTA)  Weight change: Filed Weights   04/27/23 0500 04/28/23 0500 04/29/23 0532  Weight: 98.4 kg 100.5 kg 93 kg    Intake/Output:   Intake/Output Summary (Last 24 hours) at 04/29/2023 1109 Last data filed at 04/29/2023 1000 Gross per 24 hour  Intake 2785.43 ml  Output 8345 ml  Net -5559.57 ml      Physical Exam    General:  Intubated/sedated HEENT: normal + ETT/OG Neck: supple. RIJ swan Cor: Chest open. With dressing in place + CTs + ECMO cannulas Lungs: Minimal BS Abdomen: obese soft, nontender, nondistended. No hepatosplenomegaly. No bruits or masses. Good bowel sounds. Extremities: no cyanosis, clubbing, rash, 1-2+ edema Neuro: sedated   Telemetry   V paced 90 with frequent PVCs  Asystole underneath Personally reviewed  Labs    CBC Recent Labs    04/28/23 2323 04/28/23 2328  04/29/23 0420 04/29/23 0605 04/29/23 0920  WBC 10.4  --  9.5  --   --   HGB 10.3*   < > 9.4* 8.8* 9.2*  HCT 30.1*   < > 28.2* 26.0* 27.0*  MCV 86.5  --  87.0  --   --   PLT 88*  --  81*  --   --    < > = values in this interval not displayed.   Basic Metabolic Panel Recent Labs    16/60/63 2323 04/28/23 2328 04/29/23 0420 04/29/23 0605 04/29/23 0920  NA 152*   < > 151* 152* 150*  K 3.9   < > 4.2 4.1 3.9  CL 112*  --  110  --   --   CO2 33*  --  32  --   --   GLUCOSE 153*  --  161*  --   --   BUN 21*  --  23*  --   --   CREATININE 1.44*  --  1.57*  --   --   CALCIUM 8.4*  --  8.4*  --   --   MG 2.4  --  2.3  --   --   PHOS 3.4  --  2.8  --   --    < > = values in this interval not displayed.   Liver Function Tests  Recent Labs    04/28/23 0304 04/29/23 0420  AST 271* 170*  ALT 50* 54*  ALKPHOS 25* 36*  BILITOT 3.0* 4.8*  PROT 4.0* 5.4*  ALBUMIN 2.5* 3.3*   No results for input(s): "LIPASE", "AMYLASE" in the last 72 hours. Cardiac Enzymes No results for input(s): "CKTOTAL", "CKMB", "CKMBINDEX", "TROPONINI" in the last 72 hours.  BNP: BNP (last 3 results) No results for input(s): "BNP" in the last 8760 hours.  ProBNP (last 3 results) No results for input(s): "PROBNP" in the last 8760 hours.   D-Dimer Recent Labs    04/27/23 0721 04/27/23 1553  DDIMER 1.79* 0.60*   Hemoglobin A1C No results for input(s): "HGBA1C" in the last 72 hours.  Fasting Lipid Panel No results for input(s): "CHOL", "HDL", "LDLCALC", "TRIG", "CHOLHDL", "LDLDIRECT" in the last 72 hours. Thyroid Function Tests No results for input(s): "TSH", "T4TOTAL", "T3FREE", "THYROIDAB" in the last 72 hours.  Invalid input(s): "FREET3"  Other results:   Imaging    DG CHEST PORT 1 VIEW  Result Date: 04/29/2023 CLINICAL DATA:  [id number] Personal history of ECMO [id number] EXAM: PORTABLE CHEST 1 VIEW COMPARISON:  April 28, 2023 FINDINGS: The cardiomediastinal silhouette is unchanged in  contour.Status post aortic valve replacement. RIGHT IJ PA catheter tip terminates over the RIGHT main pulmonary artery. ECMO cannulation support apparatus. ETT tip is obscured by multiple overlapping tubes in the mid mediastinum. It is estimated to terminate 8 mm above the carina. The enteric tube courses through the chest to the abdomen beyond the field-of-view. Midline surgical drains. Small LEFT pleural effusion. No significant pneumothorax. LEFT basilar platelike opacity most consistent with atelectasis. IMPRESSION: 1. ETT tip is obscured by multiple overlapping tubes in the mid mediastinum. It is estimated to terminate 8 mm above the carina. 2. Small LEFT pleural effusion and LEFT-sided atelectasis. Electronically Signed   By: Meda Klinefelter M.D.   On: 04/29/2023 09:26   DG Abd Portable 1V  Result Date: 04/28/2023 CLINICAL DATA:  Feeding tube adjustment. EXAM: PORTABLE ABDOMEN - 1 VIEW COMPARISON:  Abdominal x-ray from same day at 1104 hours. FINDINGS: Interval advancement of the feeding tube with the tip near the pylorus, possibly within the first portion of the duodenum. Normal bowel gas pattern. Mediastinal drains and ECMO catheters again noted. IMPRESSION: 1. Interval advancement of the feeding tube with the tip near the pylorus, possibly within the first portion of the duodenum. Electronically Signed   By: Obie Dredge M.D.   On: 04/28/2023 18:23   DG Abd Portable 1V  Result Date: 04/28/2023 CLINICAL DATA:  ECMO support.  Placement of feeding to EXAM: PORTABLE ABDOMEN - 1 VIEW COMPARISON:  Radiograph 04/28/2023, CT abdomen 04/27/2023 FINDINGS: Introduction of feeding 2 which is coiled in the mid stomach. ECMO catheter noted. Multiple mediastinal drains and catheters midline project over the heart. IMPRESSION: Feeding tube coiled in mid stomach. Electronically Signed   By: Genevive Bi M.D.   On: 04/28/2023 11:53     Medications:     Scheduled Medications:  Chlorhexidine  Gluconate Cloth  6 each Topical Q0600   docusate  100 mg Per Tube BID   feeding supplement (PIVOT 1.5 CAL)  1,000 mL Per Tube Q24H   feeding supplement (PROSource TF20)  60 mL Per Tube Q4H   free water  200 mL Per Tube Q2H   insulin aspart  0-24 Units Subcutaneous Q4H   mouth rinse  15 mL Mouth Rinse Q2H   pantoprazole (PROTONIX) IV  40 mg Intravenous QHS  polyethylene glycol  17 g Per Tube Daily   sodium chloride flush  10-40 mL Intracatheter Q12H    Infusions:  albumin human 60 mL/hr at 04/29/23 0905   DOPamine 2 mcg/kg/min (04/29/23 1000)   furosemide (LASIX) 200 mg in dextrose 5 % 100 mL (2 mg/mL) infusion 4 mg/hr (04/29/23 1002)   HYDROmorphone 6 mg/hr (04/29/23 1015)   meropenem (MERREM) IV 200 mL/hr at 04/29/23 1000   midazolam 8 mg/hr (04/29/23 1000)   norepinephrine (LEVOPHED) Adult infusion 8 mcg/min (04/29/23 1000)   vancomycin Stopped (04/29/23 0628)    PRN Medications: albumin human, HYDROmorphone, midazolam, morphine injection, ondansetron (ZOFRAN) IV, mouth rinse, oxyCODONE, rocuronium, sodium chloride flush    Patient Profile   Johnathan Anderson is a 57 y.o. male with a past medical history of bicuspid aortic valve and moderate aortic root dilation who underwent graft and valve replacement. Complicated by VT arrest and biventricular shock (RV predominant) requiring central VA ecmo cannulation.   Assessment/Plan    1. Post-cardiotomy Cardiogenic shock VT arrest -VT arrest and shock post-op 11/20 following Bental procedure and CABG X 2 (RSVG from aortic graft to RCA and from the vain bypass to LAD) -TEE images reviewed, patient with RV standstill, likely due to ischemia and prolonged pump time.  - Back to OR 11/20 without identification of bleeding site. Cannulated on central ECMO for ongoing RV failure - Remains on central VA ECMO + NE 4. DA 2 Epi off due to acidosis (mitochondrial uncoupling). Titrate to keep MAPs > 70  - Flows ~ 4/5 L - Hemoglobin goal 8 (9.3  today) - Minimal pulsativity on Aline and PA cath. Mild alternans on A-line tracing - Bedside echo 11/22LV EF < 20% RV severe HK. Small effusion - Remains critically ill. - Persistent asystole and severe biventricular dysfunction very concerning. I am concerned that he may not recover. Have reached out to [facility]plant team to see if transplant may be an out for him. 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 If still no recovery will d/w [facility] again  - No AC yet. Place SCDs  2. Bicuspid aortic valve/ascending aortic aneurysm -S/p Bental 11/20 as above  3. Post-op ABLA & thrombocytopenia -Has received multiple blood products - hgb 9.3 Transfuse as needed - PLTs 81K   4. Hypernatremia - continue FW  5. Acute post-op hypoxic respiratory failure - vent per CCM  - Keep sedated - Head CT 11/21 ok   6. Asystole - continue pacing - I adjusted today  CRITICAL CARE Performed by: Arvilla Meres  Total critical care time: 65 minutes  Critical care time was exclusive of separately billable procedures and treating other patients.  Critical care was necessary to treat or prevent imminent or life-threatening deterioration.  Critical care was time spent personally by me (independent of midlevel providers or residents) on the following activities: development of treatment plan with patient and/or surrogate as well as nursing, discussions with consultants, evaluation of patient's response to treatment, examination of patient, obtaining history from patient or surrogate, ordering and performing treatments and interventions, ordering and review of laboratory studies, ordering and review of radiographic studies, pulse oximetry and re-evaluation of patient's condition.    Length of Stay: 3  Arvilla Meres, MD  04/29/2023, 11:09 AM  Advanced Heart Failure Team Pager [phone] (M-F; 7a - 5p)  Please  contact [facility] for night-coverage after hours (5p -  7a ) and weekends on [email or web address]

## 2023-04-29 NOTE — Progress Notes (Signed)
PCCM INTERVAL PROGRESS NOTE  Called by bedside RN re: hiccupping.   D/w Dr Denese Killings, will ordered baclofen 10mg  TID per tube.    Joneen Roach, AGACNP-BC [facility]  See Amion for personal pager PCCM on call pager [phone] until 7pm. Please call Elink 7p-7a. [phone]  04/29/2023 11:24 PM

## 2023-04-30 ENCOUNTER — Inpatient Hospital Stay (HOSPITAL_COMMUNITY): Payer: Commercial Managed Care - HMO

## 2023-04-30 DIAGNOSIS — R570 Cardiogenic shock: Secondary | ICD-10-CM | POA: Diagnosis not present

## 2023-04-30 DIAGNOSIS — D689 Coagulation defect, unspecified: Secondary | ICD-10-CM | POA: Diagnosis not present

## 2023-04-30 DIAGNOSIS — R739 Hyperglycemia, unspecified: Secondary | ICD-10-CM | POA: Diagnosis not present

## 2023-04-30 DIAGNOSIS — J9601 Acute respiratory failure with hypoxia: Secondary | ICD-10-CM | POA: Diagnosis not present

## 2023-04-30 LAB — BASIC METABOLIC PANEL
Anion gap: 10 (ref 5–15)
Anion gap: 8 (ref 5–15)
BUN: 31 mg/dL — ABNORMAL HIGH (ref 6–20)
BUN: 33 mg/dL — ABNORMAL HIGH (ref 6–20)
CO2: 29 mmol/L (ref 22–32)
CO2: 31 mmol/L (ref 22–32)
Calcium: 8.8 mg/dL — ABNORMAL LOW (ref 8.9–10.3)
Calcium: 8.9 mg/dL (ref 8.9–10.3)
Chloride: 107 mmol/L (ref 98–111)
Chloride: 106 mmol/L (ref 98–111)
Creatinine, Ser: 1.26 mg/dL — ABNORMAL HIGH (ref 0.61–1.24)
Creatinine, Ser: 1.28 mg/dL — ABNORMAL HIGH (ref 0.61–1.24)
GFR, Estimated: >60 mL/min (ref >60)
GFR, Estimated: >60 mL/min (ref >60)
Glucose, Bld: 150 mg/dL — ABNORMAL HIGH (ref 70–99)
Glucose, Bld: 143 mg/dL — ABNORMAL HIGH (ref 70–99)
Potassium: 3.3 mmol/L — ABNORMAL LOW (ref 3.5–5.1)
Potassium: 3.9 mmol/L (ref 3.5–5.1)
Sodium: 146 mmol/L — ABNORMAL HIGH (ref 135–145)
Sodium: 145 mmol/L (ref 135–145)

## 2023-04-30 LAB — POCT I-STAT 7, (LYTES, BLD GAS, ICA,H+H)
Acid-Base Excess: 7 mmol/L — ABNORMAL HIGH (ref 0.0–2.0)
Acid-Base Excess: 9 mmol/L — ABNORMAL HIGH (ref 0.0–2.0)
Acid-Base Excess: 8 mmol/L — ABNORMAL HIGH (ref 0.0–2.0)
Acid-Base Excess: 5 mmol/L — ABNORMAL HIGH (ref 0.0–2.0)
Acid-Base Excess: 7 mmol/L — ABNORMAL HIGH (ref 0.0–2.0)
Bicarbonate: 33.6 mmol/L — ABNORMAL HIGH (ref 20.0–28.0)
Bicarbonate: 34 mmol/L — ABNORMAL HIGH (ref 20.0–28.0)
Bicarbonate: 32.6 mmol/L — ABNORMAL HIGH (ref 20.0–28.0)
Bicarbonate: 31.2 mmol/L — ABNORMAL HIGH (ref 20.0–28.0)
Bicarbonate: 32.8 mmol/L — ABNORMAL HIGH (ref 20.0–28.0)
Calcium, Ion: 1.22 mmol/L (ref 1.15–1.40)
Calcium, Ion: 1.17 mmol/L (ref 1.15–1.40)
Calcium, Ion: 1.17 mmol/L (ref 1.15–1.40)
Calcium, Ion: 1.22 mmol/L (ref 1.15–1.40)
Calcium, Ion: 1.22 mmol/L (ref 1.15–1.40)
HCT: 27 % — ABNORMAL LOW (ref 39.0–52.0)
HCT: 45 % (ref 39.0–52.0)
HCT: 26 % — ABNORMAL LOW (ref 39.0–52.0)
HCT: 39 % (ref 39.0–52.0)
HCT: 27 % — ABNORMAL LOW (ref 39.0–52.0)
Hemoglobin: 9.2 g/dL — ABNORMAL LOW (ref 13.0–17.0)
Hemoglobin: 15.3 g/dL (ref 13.0–17.0)
Hemoglobin: 8.8 g/dL — ABNORMAL LOW (ref 13.0–17.0)
Hemoglobin: 13.3 g/dL (ref 13.0–17.0)
Hemoglobin: 9.2 g/dL — ABNORMAL LOW (ref 13.0–17.0)
O2 Saturation: 100 %
O2 Saturation: 100 %
O2 Saturation: 100 %
O2 Saturation: 99 %
O2 Saturation: 99 %
Patient temperature: 37
Patient temperature: 36.8
Patient temperature: 36.9
Patient temperature: 36.8
Patient temperature: 36.7
Potassium: 3.6 mmol/L (ref 3.5–5.1)
Potassium: 3.3 mmol/L — ABNORMAL LOW (ref 3.5–5.1)
Potassium: 3.6 mmol/L (ref 3.5–5.1)
Potassium: 3.9 mmol/L (ref 3.5–5.1)
Potassium: 3.7 mmol/L (ref 3.5–5.1)
Sodium: 151 mmol/L — ABNORMAL HIGH (ref 135–145)
Sodium: 147 mmol/L — ABNORMAL HIGH (ref 135–145)
Sodium: 145 mmol/L (ref 135–145)
Sodium: 146 mmol/L — ABNORMAL HIGH (ref 135–145)
Sodium: 147 mmol/L — ABNORMAL HIGH (ref 135–145)
TCO2: 35 mmol/L — ABNORMAL HIGH (ref 22–32)
TCO2: 35 mmol/L — ABNORMAL HIGH (ref 22–32)
TCO2: 34 mmol/L — ABNORMAL HIGH (ref 22–32)
TCO2: 33 mmol/L — ABNORMAL HIGH (ref 22–32)
TCO2: 34 mmol/L — ABNORMAL HIGH (ref 22–32)
pCO2 arterial: 57 mm[Hg] — ABNORMAL HIGH (ref 32–48)
pCO2 arterial: 46.5 mm[Hg] (ref 32–48)
pCO2 arterial: 45.6 mm[Hg] (ref 32–48)
pCO2 arterial: 50.8 mm[Hg] — ABNORMAL HIGH (ref 32–48)
pCO2 arterial: 49.3 mm[Hg] — ABNORMAL HIGH (ref 32–48)
pH, Arterial: 7.379 (ref 7.35–7.45)
pH, Arterial: 7.472 — ABNORMAL HIGH (ref 7.35–7.45)
pH, Arterial: 7.462 — ABNORMAL HIGH (ref 7.35–7.45)
pH, Arterial: 7.395 (ref 7.35–7.45)
pH, Arterial: 7.429 (ref 7.35–7.45)
pO2, Arterial: 418 mm[Hg] — ABNORMAL HIGH (ref 83–108)
pO2, Arterial: 206 mm[Hg] — ABNORMAL HIGH (ref 83–108)
pO2, Arterial: 207 mm[Hg] — ABNORMAL HIGH (ref 83–108)
pO2, Arterial: 136 mm[Hg] — ABNORMAL HIGH (ref 83–108)
pO2, Arterial: 153 mm[Hg] — ABNORMAL HIGH (ref 83–108)

## 2023-04-30 LAB — MAGNESIUM: Magnesium: 2 mg/dL (ref 1.7–2.4)

## 2023-04-30 LAB — GLOBAL TEG PANEL
CFF Max Amplitude: 20.7 mm (ref 15–32)
CK with Heparinase (R): 7.7 min (ref 4.3–8.3)
Citrated Functional Fibrinogen: 377.7 mg/dL (ref 278–581)
Citrated Kaolin (K): 1.8 min (ref 0.8–2.1)
Citrated Kaolin (MA): 56.2 mm (ref 52–69)
Citrated Kaolin (R): 7.7 min (ref 4.6–9.1)
Citrated Kaolin Angle: 70 deg (ref 63–78)
Citrated Rapid TEG (MA): 55.8 mm (ref 52–70)

## 2023-04-30 LAB — CBC
HCT: 25.9 % — ABNORMAL LOW (ref 39.0–52.0)
HCT: 27.7 % — ABNORMAL LOW (ref 39.0–52.0)
Hemoglobin: 8.5 g/dL — ABNORMAL LOW (ref 13.0–17.0)
Hemoglobin: 9 g/dL — ABNORMAL LOW (ref 13.0–17.0)
MCH: 28.9 pg (ref 26.0–34.0)
MCH: 29.1 pg (ref 26.0–34.0)
MCHC: 32.8 g/dL (ref 30.0–36.0)
MCHC: 32.5 g/dL (ref 30.0–36.0)
MCV: 88.1 fL (ref 80.0–100.0)
MCV: 89.6 fL (ref 80.0–100.0)
Platelets: 59 10*3/uL — ABNORMAL LOW (ref 150–400)
Platelets: 54 10*3/uL — ABNORMAL LOW (ref 150–400)
RBC: 2.94 MIL/uL — ABNORMAL LOW (ref 4.22–5.81)
RBC: 3.09 MIL/uL — ABNORMAL LOW (ref 4.22–5.81)
RDW: 15.7 % — ABNORMAL HIGH (ref 11.5–15.5)
RDW: 15.6 % — ABNORMAL HIGH (ref 11.5–15.5)
WBC: 7.3 10*3/uL (ref 4.0–10.5)
WBC: 8.2 10*3/uL (ref 4.0–10.5)
nRBC: 0.3 % — ABNORMAL HIGH (ref 0.0–0.2)
nRBC: 0.2 % (ref 0.0–0.2)

## 2023-04-30 LAB — HEPATIC FUNCTION PANEL
ALT: 44 U/L (ref 0–44)
AST: 108 U/L — ABNORMAL HIGH (ref 15–41)
Albumin: 3.3 g/dL — ABNORMAL LOW (ref 3.5–5.0)
Alkaline Phosphatase: 52 U/L (ref 38–126)
Bilirubin, Direct: 2.8 mg/dL — ABNORMAL HIGH (ref 0.0–0.2)
Indirect Bilirubin: 2.5 mg/dL — ABNORMAL HIGH (ref 0.3–0.9)
Total Bilirubin: 5.3 mg/dL — ABNORMAL HIGH (ref <1.2)
Total Protein: 5.4 g/dL — ABNORMAL LOW (ref 6.5–8.1)

## 2023-04-30 LAB — GLUCOSE, CAPILLARY
Glucose-Capillary: 138 mg/dL — ABNORMAL HIGH (ref 70–99)
Glucose-Capillary: 150 mg/dL — ABNORMAL HIGH (ref 70–99)
Glucose-Capillary: 143 mg/dL — ABNORMAL HIGH (ref 70–99)
Glucose-Capillary: 142 mg/dL — ABNORMAL HIGH (ref 70–99)
Glucose-Capillary: 143 mg/dL — ABNORMAL HIGH (ref 70–99)
Glucose-Capillary: 146 mg/dL — ABNORMAL HIGH (ref 70–99)

## 2023-04-30 LAB — PROTIME-INR
INR: 1.2 (ref 0.8–1.2)
Prothrombin Time: 15.8 s — ABNORMAL HIGH (ref 11.4–15.2)

## 2023-04-30 LAB — COOXEMETRY PANEL
Carboxyhemoglobin: 1.6 % — ABNORMAL HIGH (ref 0.5–1.5)
Methemoglobin: <0.7 % (ref 0.0–1.5)
O2 Saturation: 68.9 %
Total hemoglobin: 9 g/dL — ABNORMAL LOW (ref 12.0–16.0)

## 2023-04-30 LAB — POCT I-STAT, CHEM 8
BUN: 34 mg/dL — ABNORMAL HIGH (ref 6–20)
Calcium, Ion: 1.2 mmol/L (ref 1.15–1.40)
Chloride: 107 mmol/L (ref 98–111)
Creatinine, Ser: 1.2 mg/dL (ref 0.61–1.24)
Glucose, Bld: 140 mg/dL — ABNORMAL HIGH (ref 70–99)
HCT: 26 % — ABNORMAL LOW (ref 39.0–52.0)
Hemoglobin: 8.8 g/dL — ABNORMAL LOW (ref 13.0–17.0)
Potassium: 4 mmol/L (ref 3.5–5.1)
Sodium: 148 mmol/L — ABNORMAL HIGH (ref 135–145)
TCO2: 31 mmol/L (ref 22–32)

## 2023-04-30 LAB — PHOSPHORUS: Phosphorus: 1.1 mg/dL — ABNORMAL LOW (ref 2.5–4.6)

## 2023-04-30 LAB — CG4 I-STAT (LACTIC ACID): Lactic Acid, Venous: 0.7 mmol/L (ref 0.5–1.9)

## 2023-04-30 LAB — FIBRINOGEN: Fibrinogen: 592 mg/dL — ABNORMAL HIGH (ref 210–475)

## 2023-04-30 LAB — LACTATE DEHYDROGENASE: LDH: 633 U/L — ABNORMAL HIGH (ref 98–192)

## 2023-04-30 LAB — APTT: aPTT: 42 s — ABNORMAL HIGH (ref 24–36)

## 2023-04-30 MED ORDER — HYDROMORPHONE BOLUS VIA INFUSION
0.2500 mg | INTRAVENOUS | Status: DC | PRN
Start: 2023-04-30 — End: 2023-05-02
  Administered 2023-04-30 – 2023-05-01 (×3): 1 mg via INTRAVENOUS

## 2023-04-30 MED ORDER — SODIUM CHLORIDE 0.9 % IV SOLN
0.0100 mg/kg/h | INTRAVENOUS | Status: DC
  Filled 2023-04-30: qty 250

## 2023-04-30 MED ORDER — POTASSIUM PHOSPHATES 15 MMOLE/5ML IV SOLN
30.0000 mmol | Freq: Once | INTRAVENOUS | Status: AC
Start: 2023-04-30 — End: 2023-04-30
  Administered 2023-04-30: 30 mmol via INTRAVENOUS
  Filled 2023-04-30: qty 10

## 2023-04-30 MED ORDER — GABAPENTIN 250 MG/5ML PO SOLN
100.0000 mg | Freq: Three times a day (TID) | ORAL | Status: DC
  Administered 2023-04-30 (×2): 100 mg via ORAL
  Filled 2023-04-30 (×4): qty 2

## 2023-04-30 MED ORDER — ROCURONIUM BROMIDE 10 MG/ML (PF) SYRINGE
100.0000 mg | PREFILLED_SYRINGE | Freq: Once | INTRAVENOUS | Status: DC
  Filled 2023-04-30: qty 10

## 2023-04-30 MED ORDER — POTASSIUM CHLORIDE 20 MEQ PO PACK
40.0000 meq | PACK | ORAL | Status: AC
Start: 2023-04-30 — End: 2023-04-30
  Administered 2023-04-30 (×2): 40 meq
  Filled 2023-04-30 (×2): qty 2

## 2023-04-30 MED ORDER — POTASSIUM & SODIUM PHOSPHATES 280-160-250 MG PO PACK
2.0000 | PACK | Freq: Once | ORAL | Status: AC
  Administered 2023-04-30: 2
  Filled 2023-04-30: qty 2

## 2023-04-30 MED ORDER — SODIUM CHLORIDE 0.9 % IV SOLN
0.5000 mg/h | INTRAVENOUS | Status: DC
  Administered 2023-04-30: 2 mg/h via INTRAVENOUS
  Administered 2023-04-30: 3 mg/h via INTRAVENOUS
  Administered 2023-05-01: 2.5 mg/h via INTRAVENOUS
  Filled 2023-04-30 (×4): qty 5

## 2023-04-30 MED ORDER — MAGNESIUM SULFATE 2 GM/50ML IV SOLN
2.0000 g | Freq: Once | INTRAVENOUS | Status: AC
Start: 2023-04-30 — End: 2023-04-30
  Administered 2023-04-30: 2 g via INTRAVENOUS
  Filled 2023-04-30: qty 50

## 2023-04-30 MED ORDER — GABAPENTIN 250 MG/5ML PO SOLN
200.0000 mg | Freq: Three times a day (TID) | ORAL | Status: DC
  Administered 2023-04-30 – 2023-05-01 (×3): 200 mg via ORAL
  Filled 2023-04-30 (×5): qty 6

## 2023-04-30 NOTE — Progress Notes (Signed)
ECMO NOTE:   Indication: Cardiogenic shock due to RV faliure   Initial cannulation date: 11/20   ECMO type: VA ECMO   Dual lumen inflow/return cannula:   1) Two stage venous return cannula in the right atrium 1) 20Fr Sarns cannula in the aorta    ECMO events:   - Initial cannulation 04/26/23    Daily data:   Flow 4.1L RPM 3650 Sweep  3L -> 4.5 Pint 309 Pven -34  Streaking clot from 12 down    Plan:  Continue ECMO support goal 4 to 4.5L No AC yet Continue full support To OR tomorrow D/w [facility] for possible transplant eval early next week   Discussed in multidisciplinary fashion on ECMO rounds with CCM, Cardiology, ECMO coordinator/specialist, RT, PharmD and nursing staff all present.    Arvilla Meres, MD  11:57 AM

## 2023-04-30 NOTE — Progress Notes (Signed)
RT was called to room 2H22 to check the possibility of a cuff leak. Upon arrival pt. was having coughing spells and a cuff leak was audible. RN attempted to suction and described that there may be a possibility of thick secretions due to suction stopping when retracting suction catheter. RT attempted and got small amounts of thick secretions. The pt then began to have peak pressures of 46, a minute ventilation of 1.7 and exhaled tidal volume of 122. MD was notified by ecmo specialist of current event and was asked to come to the room. RT then asked ecmo specialist if it was okay to lavage and pt. Because It was believed that there was still secretions lingering. The pt. was then given 100% O2 through the ecmo pump and RT lavaged X2 getting  yellow/tan thick moderate secretions in which pt. Sat began to slowly come back up.

## 2023-04-30 NOTE — Plan of Care (Signed)
  Problem: Education: Goal: Knowledge of General Education information will improve Description: Including pain rating scale, medication(s)/side effects and non-pharmacologic comfort measures Outcome: Not Progressing   Problem: Health Behavior/Discharge Planning: Goal: Ability to manage health-related needs will improve Outcome: Not Progressing   Problem: Clinical Measurements: Goal: Ability to maintain clinical measurements within normal limits will improve Outcome: Progressing Goal: Will remain free from infection Outcome: Progressing Goal: Diagnostic test results will improve Outcome: Progressing Goal: Respiratory complications will improve Outcome: Progressing Goal: Cardiovascular complication will be avoided Outcome: Progressing   Problem: Activity: Goal: Risk for activity intolerance will decrease Outcome: Not Progressing   Problem: Nutrition: Goal: Adequate nutrition will be maintained Outcome: Progressing   Problem: Coping: Goal: Level of anxiety will decrease Outcome: Progressing   Problem: Elimination: Goal: Will not experience complications related to bowel motility Outcome: Not Progressing Goal: Will not experience complications related to urinary retention Outcome: Progressing   Problem: Pain Management: Goal: General experience of comfort will improve Outcome: Not Progressing   Problem: Safety: Goal: Ability to remain free from injury will improve Outcome: Progressing   Problem: Skin Integrity: Goal: Risk for impaired skin integrity will decrease Outcome: Not Progressing Note: Patient is unable to tolerate movement in the bed, increasing the risk for pressure injuries    Problem: Education: Goal: Will demonstrate proper wound care and an understanding of methods to prevent future damage Outcome: Not Met (add Reason) Goal: Knowledge of disease or condition will improve Outcome: Not Met (add Reason) Goal: Knowledge of the prescribed therapeutic  regimen will improve Outcome: Not Met (add Reason) Goal: Individualized Educational Video(s) Outcome: Not Met (add Reason)   Problem: Activity: Goal: Risk for activity intolerance will decrease Outcome: Not Progressing   Problem: Cardiac: Goal: Will achieve and/or maintain hemodynamic stability Outcome: Not Progressing   Problem: Clinical Measurements: Goal: Postoperative complications will be avoided or minimized Outcome: Not Progressing   Problem: Respiratory: Goal: Respiratory status will improve Outcome: Progressing   Problem: Skin Integrity: Goal: Wound healing without signs and symptoms of infection Outcome: Progressing Goal: Risk for impaired skin integrity will decrease Outcome: Not Progressing   Problem: Urinary Elimination: Goal: Ability to achieve and maintain adequate renal perfusion and functioning will improve Outcome: Progressing   Problem: Activity: Goal: Ability to tolerate increased activity will improve Outcome: Not Progressing   Problem: Respiratory: Goal: Ability to maintain a clear airway and adequate ventilation will improve Outcome: Progressing   Problem: Role Relationship: Goal: Method of communication will improve Outcome: Not Progressing

## 2023-04-30 NOTE — Progress Notes (Addendum)
Advanced Heart Failure Rounding Note  PCP-Cardiologist: Christell Constant, MD   Subjective:    Remains intubated/sedated. On central VA/ECMO  NE 4 Dopa 2  Developed hiccups overnight. No response to Baclofen. Sedation increased without too much benefit. Was transiently paralyzed.   Was having thick tracheal secretions.  Remains asystolic underneath pacer  Remains on lasix gtt at 4. CVP 7-8  CXR: clear on my read   ABG 7.46/46/208/100%  Flow 4.1L RPM 3650 Sweep  3L -> 4.5 Pint 309 Pven -34  PAP: (11-26)/(5-17) 16/8 CVP:  [4 mmHg-20 mmHg] 9 mmHg   LA 0.7 LDH 584 -> 651 -> 674 -> 633 Hgb 9.4 -> 8.5  PLT 88k->81k -> 59 Sodium 155 -> 151 -> 146 SCr 1.43 -> 1.57  Bili 3.0 -> 4.8 -> 2.8   Objective:   Weight Range: 95.9 kg Body mass index is 34.12 kg/m.   Vital Signs:   Temp:  [98.1 F (36.7 C)-98.6 F (37 C)] 98.2 F (36.8 C) (11/24 0930) Pulse Rate:  [31-96] 85 (11/24 0930) Resp:  [15-25] 19 (11/24 0930) BP: (86-100)/(72-87) 100/87 (11/24 0800) SpO2:  [86 %-100 %] 98 % (11/24 0930) Arterial Line BP: (87-160)/(55-95) 91/80 (11/24 0930) FiO2 (%):  [50 %] 50 % (11/24 0800) Weight:  [95.9 kg] 95.9 kg (11/24 0500) Last BM Date :  (PTA)  Weight change: Filed Weights   04/28/23 0500 04/29/23 0532 04/30/23 0500  Weight: 100.5 kg 93 kg 95.9 kg    Intake/Output:   Intake/Output Summary (Last 24 hours) at 04/30/2023 1048 Last data filed at 04/30/2023 1000 Gross per 24 hour  Intake 4365.38 ml  Output 6425 ml  Net -2059.62 ml      Physical Exam    General:  Intubated/sedated HEENT: normal + ETT/OG Neck: supple. RIJ swan Cor: Chest open. With dressing in place  (Seems to be more blood under dressing today) + CTs + ECMO cannulas Lungs: clear Abdomen: soft, nontender, nondistended. No hepatosplenomegaly. No bruits or masses. Good bowel sounds. Extremities: no cyanosis, clubbing, rash, 1+ edema Neuro:intubated/sedated + hiccups  Telemetry    V paced 80 Asystole underneath Personally reviewed  Labs    CBC Recent Labs    04/29/23 1628 04/29/23 1637 04/30/23 0328 04/30/23 0343 04/30/23 0614  WBC 8.4  --  7.3  --   --   HGB 9.5*   < > 8.5* 15.3 8.8*  HCT 28.5*   < > 25.9* 45.0 26.0*  MCV 88.8  --  88.1  --   --   PLT 71*  --  59*  --   --    < > = values in this interval not displayed.   Basic Metabolic Panel Recent Labs    32/35/57 0420 04/29/23 0605 04/29/23 1628 04/29/23 1637 04/30/23 0328 04/30/23 0343 04/30/23 0614  NA 151*   < > 147*   < > 146* 147* 148*  K 4.2   < > 4.2   < > 3.3* 3.3* 4.0  CL 110   < > 106  --  107  --  107  CO2 32   < > 33*  --  29  --   --   GLUCOSE 161*   < > 140*  --  150*  --  140*  BUN 23*   < > 28*  --  31*  --  34*  CREATININE 1.57*   < > 1.45*  --  1.26*  --  1.20  CALCIUM 8.4*   < >  8.5*  --  8.8*  --   --   MG 2.3  --   --   --  2.0  --   --   PHOS 2.8  --   --   --  1.1*  --   --    < > = values in this interval not displayed.   Liver Function Tests Recent Labs    04/29/23 0420 04/30/23 0328  AST 170* 108*  ALT 54* 44  ALKPHOS 36* 52  BILITOT 4.8* 5.3*  PROT 5.4* 5.4*  ALBUMIN 3.3* 3.3*   No results for input(s): "LIPASE", "AMYLASE" in the last 72 hours. Cardiac Enzymes No results for input(s): "CKTOTAL", "CKMB", "CKMBINDEX", "TROPONINI" in the last 72 hours.  BNP: BNP (last 3 results) No results for input(s): "BNP" in the last 8760 hours.  ProBNP (last 3 results) No results for input(s): "PROBNP" in the last 8760 hours.   D-Dimer Recent Labs    04/27/23 1553  DDIMER 0.60*   Hemoglobin A1C No results for input(s): "HGBA1C" in the last 72 hours.  Fasting Lipid Panel No results for input(s): "CHOL", "HDL", "LDLCALC", "TRIG", "CHOLHDL", "LDLDIRECT" in the last 72 hours. Thyroid Function Tests No results for input(s): "TSH", "T4TOTAL", "T3FREE", "THYROIDAB" in the last 72 hours.  Invalid input(s): "FREET3"  Other results:   Imaging     DG CHEST PORT 1 VIEW  Result Date: 04/30/2023 CLINICAL DATA:  Patient on ECMO. EXAM: PORTABLE CHEST 1 VIEW COMPARISON:  04/29/2023 FINDINGS: Stable cardiomediastinal contours. Status post aortic valve replacement. There is a right IJ PA catheter with tip terminating over the right main pulmonary artery. ECMO cannulation support apparatus. The endotracheal tube tip is suboptimally visualized due to multiple overlapping tubes within the projection of the mediastinum. Enteric tube courses below the level of the GE junction. Mediastinal drain unchanged. Small pleural effusions are stable from previous exam. Tiny pneumothorax is noted overlying the periphery of the right mid and no significant left pneumothorax. Platelike atelectasis noted within the periphery of the left base. Lower lung measuring 3 mm in thickness. IMPRESSION: 1. Cannot confirm positioning of ET tube due to overlap of multiple tubes and lines within the projection of the mediastinum. The remaining support apparatus appears stable from previous exam. 2. Tiny pneumothorax overlying the periphery of the right mid and lower lung, approximately 3 mm in thickness. 3. Stable small pleural effusions. 4. Platelike atelectasis within the periphery of the left base. These results will be called to the ordering clinician or representative by the Radiologist Assistant, and communication documented in the PACS or [employer]. The Electronically Signed   By: Signa Kell M.D.   On: 04/30/2023 09:58     Medications:     Scheduled Medications:  Chlorhexidine Gluconate Cloth  6 each Topical Q0600   docusate  100 mg Per Tube BID   feeding supplement (PIVOT 1.5 CAL)  1,000 mL Per Tube Q24H   feeding supplement (PROSource TF20)  60 mL Per Tube Q4H   free water  200 mL Per Tube Q2H   gabapentin  100 mg Oral Q8H   insulin aspart  0-24 Units Subcutaneous Q4H   mouth rinse  15 mL Mouth Rinse Q2H   pantoprazole (PROTONIX) IV  40 mg Intravenous QHS    polyethylene glycol  17 g Per Tube Daily   potassium & sodium phosphates  2 packet Per Tube Once   rocuronium  100 mg Intravenous Once   sodium chloride flush  10-40 mL  Intracatheter Q12H    Infusions:  albumin human 12.5 g (04/29/23 2238)   bivalirudin (ANGIOMAX) 250 mg in sodium chloride 0.9 % 500 mL (0.5 mg/mL) infusion     DOPamine 2 mcg/kg/min (04/30/23 0900)   furosemide (LASIX) 200 mg in dextrose 5 % 100 mL (2 mg/mL) infusion 4 mg/hr (04/30/23 0900)   HYDROmorphone 8 mg/hr (04/30/23 0900)   HYDROmorphone 3 mg/hr (04/30/23 1030)   meropenem (MERREM) IV 1 g (04/30/23 0935)   midazolam 10 mg/hr (04/30/23 1004)   norepinephrine (LEVOPHED) Adult infusion 4 mcg/min (04/30/23 1006)   potassium PHOSPHATE IVPB (in mmol) 85 mL/hr at 04/30/23 0900   vancomycin Stopped (04/30/23 0522)    PRN Medications: albumin human, HYDROmorphone, HYDROmorphone, midazolam, morphine injection, ondansetron (ZOFRAN) IV, mouth rinse, oxyCODONE, rocuronium, sodium chloride flush    Patient Profile   Johnathan Anderson is a 57 y.o. male with a past medical history of bicuspid aortic valve and moderate aortic root dilation who underwent graft and valve replacement. Complicated by VT arrest and biventricular shock (RV predominant) requiring central VA ecmo cannulation.   Assessment/Plan    1. Post-cardiotomy Cardiogenic shock -VT arrest and shock post-op 11/20 following Bental procedure and CABG X 2 (SVG from aortic graft to RCA and SVG to LAD) - post-op TEE with severe LV dysfunction and RV standstill, likely due to ischemia and prolonged pump time.  - Back to OR 11/20 for bleeding.VT arrest. Cannulated on central ECMO for ongoing RV failure - Remains on central VA ECMO + NE 4. DA 2 Epi off due to acidosis (mitochondrial uncoupling). Titrate to keep MAPs > 70  - Circuit ok (see ECMO note) - Mild pulsativity on Aline and PA cath.  - Bedside echo 11/22 LVEF < 20% RV severe HK. Small effusion - Remains  critically ill. - Persistent asystole and severe biventricular dysfunction very concerning. I am concerned that he may not recover. Have reached out to [facility] Transplant team to see if transplant may be an out for him. 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 [facility] team will ing to consider referral for transplant evaluation tomorrow (Blood Type A-pos.) - D/w Dr. Donata Clay Will re-evaluate in OR with TEE on Monday. If still no recovery will d/w [facility] again. Can consider Impella 5.5 for support as well  - With dropping PLTs and increased blood under chest dressing will hold off on AC today  2. Bicuspid aortic valve/ascending aortic aneurysm -S/p Bental 11/20 as above  3. Post-op ABLA & thrombocytopenia -Has received multiple blood products - hgb 9.3 -> 8.5 Transfuse as needed - PLTs 81K -> 59K  4. Hypernatremia - continue FW - SNa improved to 146  5. Acute post-op hypoxic respiratory failure - vent per CCM  - Keep sedated - Head CT 11/21 ok  - Having more secretions. -> bronch today. Continue abx  6. Asystole - continue pacing  7. Hypokalemia - supp  CRITICAL CARE Performed by: Arvilla Meres  Total critical care time: 45 minutes  Critical care time was exclusive of separately billable procedures and treating other patients.  Critical care was necessary to treat or prevent imminent or life-threatening deterioration.  Critical care was time spent personally by me (independent of midlevel providers or residents) on the following activities: development of treatment plan with patient and/or surrogate as well as nursing, discussions with consultants, evaluation of patient's response to treatment, examination of patient, obtaining history from patient or surrogate,  ordering and performing treatments and interventions, ordering and review of laboratory studies, ordering and review of radiographic studies, pulse oximetry and  re-evaluation of patient's condition.    Length of Stay: 4  Arvilla Meres, MD  04/30/2023, 10:48 AM  Advanced Heart Failure Team Pager [phone] (M-F; 7a - 5p)  Please contact [facility] for night-coverage after hours (5p -7a ) and weekends on [email or web address]

## 2023-04-30 NOTE — Procedures (Signed)
Bedside Bronchoscopy Procedure Note Johnathan Anderson [id number] [birth year]  Procedure: Bronchoscopy Indications: Diagnostic evaluation of the airways, Obtain specimens for culture and/or other diagnostic studies, and Remove secretions  Procedure Details: ET Tube Size: 8.0 ET Tube secured at lip (cm): 23 Bite block in place: No In preparation for procedure, Patient hyper-oxygenated with 100 % FiO2 and Saline given via ETT (60 ml) Airway entered and the following bronchi were examined: RUL, RML, RLL, LUL, and LLL.   Bronchoscope removed.  , Patient placed back on 100% FiO2 at conclusion of procedure.    Evaluation BP 91/82   Pulse 67   Temp 98.2 F (36.8 C)   Resp (!) 9   Ht 5\' 6"  (1.676 m)   Wt 95.9 kg   SpO2 (!) 85%   BMI 34.12 kg/m  Breath Sounds:Clear and Diminished O2 sats: stable throughout Patient's Current Condition: stable Specimens:  Sent purulent fluid Complications: No apparent complications Patient did tolerate procedure well.  Per CCM, verbally, instruct RT to suction pt twice a day. RN to give paralytic prior to suction.  Megan Mans 04/30/2023, 11:53 AM

## 2023-04-30 NOTE — Progress Notes (Signed)
3 Days Post-Op Procedure(s) (LRB): EXPLORATION POST OPERATIVE OPEN HEART (N/A) TRANSESOPHAGEAL ECHOCARDIOGRAM (TEE) (N/A) Subjective: Stable overnight., developed hiccups  Objective: Vital signs in last 24 hours: Temp:  [97.9 F (36.6 C)-98.6 F (37 C)] 98.4 F (36.9 C) (11/24 0800) Pulse Rate:  [31-96] 80 (11/24 0800) Cardiac Rhythm: A-V Sequential paced (11/24 0800) Resp:  [15-25] 21 (11/24 0800) BP: (86-100)/(72-87) 100/87 (11/24 0800) SpO2:  [86 %-100 %] 99 % (11/24 0800) Arterial Line BP: (84-160)/(55-95) 101/87 (11/24 0800) FiO2 (%):  [50 %] 50 % (11/24 0800) Weight:  [95.9 kg] 95.9 kg (11/24 0500)  Hemodynamic parameters for last 24 hours: PAP: (11-26)/(6-17) 22/11 CVP:  [4 mmHg-20 mmHg] 9 mmHg  Intake/Output from previous day: 11/23 0701 - 11/24 0700 In: 4106.8 [I.V.:1133; NG/GT:2000; IV Piggyback:973.8] Out: 6080 [Urine:5870; Chest Tube:110] Intake/Output this shift: Total I/O In: 135.2 [I.V.:30.2; NG/GT:20; IV Piggyback:85] Out: 380 [Urine:350; Chest Tube:30]  General appearance: intubated Neurologic: sedated Lungs: faint wheeze on left Abdomen: hypoactive BS Wound: open, clean  Lab Results: Recent Labs    04/29/23 1628 04/29/23 1637 04/30/23 0328 04/30/23 0343 04/30/23 0614  WBC 8.4  --  7.3  --   --   HGB 9.5*   < > 8.5* 15.3 8.8*  HCT 28.5*   < > 25.9* 45.0 26.0*  PLT 71*  --  59*  --   --    < > = values in this interval not displayed.   BMET:  Recent Labs    04/29/23 1628 04/29/23 1637 04/30/23 0328 04/30/23 0343 04/30/23 0614  NA 147*   < > 146* 147* 148*  K 4.2   < > 3.3* 3.3* 4.0  CL 106  --  107  --  107  CO2 33*  --  29  --   --   GLUCOSE 140*  --  150*  --  140*  BUN 28*  --  31*  --  34*  CREATININE 1.45*  --  1.26*  --  1.20  CALCIUM 8.5*  --  8.8*  --   --    < > = values in this interval not displayed.    PT/INR:  Recent Labs    04/30/23 0328  LABPROT 15.8*  INR 1.2   ABG    Component Value Date/Time   PHART  7.472 (H) 04/30/2023 0343   HCO3 34.0 (H) 04/30/2023 0343   TCO2 31 04/30/2023 0614   ACIDBASEDEF 2.0 04/29/2023 2237   O2SAT 100 04/30/2023 0343   CBG (last 3)  Recent Labs    04/29/23 2008 04/29/23 2333 04/30/23 0326  GLUCAP 148* 163* 138*    Assessment/Plan: S/P Procedure(s) (LRB): EXPLORATION POST OPERATIVE OPEN HEART (N/A) TRANSESOPHAGEAL ECHOCARDIOGRAM (TEE) (N/A) - NEURO- intubated, sedated CV- on VA ECMO with flow 4.2 L/min  PA 15/9 co-ox 69  No underlying electrical activity  Some pulsatility with pacing RESP- PRVC 18/50%/8 PEEP Vent per CCM Hiccups- baclofen RENAL- creatinine improved at 1.26  6L of urine out  Hypokalemia - supplement ENDO- CBG moderately elevated Gi- TF at trickle rate Anemia- Hgb down slightly, monitor Thrombocytopenia- PLT 59K  No active bleeding- monitor ID- on meropenem  LOS: 4 days    Loreli Slot 04/30/2023

## 2023-04-30 NOTE — Progress Notes (Signed)
      [address]       [place] [address]             [phone]      Intubated, sedated Hiccups persist  BP 93/84   Pulse 80   Temp 98.4 F (36.9 C)   Resp 18   Ht 5\' 6"  (1.676 m)   Wt 95.9 kg   SpO2 98%   BMI 34.12 kg/m  On ECMO flowing 4.2 L/min  Vent unchanged  Bronch showed thick but not purulent secretions   Intake/Output Summary (Last 24 hours) at 04/30/2023 1750 Last data filed at 04/30/2023 1740 Gross per 24 hour  Intake 4469.45 ml  Output 6275 ml  Net -1805.55 ml   Creatinine 1.28, K 3.9 Hct 28  For washout tomorrow  Viviann Spare C. Dorris Fetch, MD [facility] [phone]

## 2023-04-30 NOTE — Progress Notes (Addendum)
Around 0100 on 11/24, The patient began having hiccups and triggering alarms on his ECMO machine. He became dysnchronous on the ventilator, and also began coughing. This RN attempted to in-line suction the patient's airway, but the secretions were too thick to extract. RT was then called to come and lavage the patient's airway. RT intervened and the issue resolved. At 0630, the same issue happened. RT was called to lavage the airway again with resolution of the coughing. Hiccups have been an ongoing problem since the first event at midnight.

## 2023-04-30 NOTE — Progress Notes (Signed)
3 Days Post-Op Procedure(s) (LRB): EXPLORATION POST OPERATIVE OPEN HEART (N/A) TRANSESOPHAGEAL ECHOCARDIOGRAM (TEE) (N/A) Subjective: Patient examined.  Most recent chest x-ray and echocardiogram image personally viewed.  Patient's current status and plan of care discussed with Dr. Gala Romney for coordination of care.  The patient is status post Bentall procedure by Dr. Leafy Ro currently on [facility] ECMO with open chest.  He is due for mediastinal washout which will be performed tomorrow midday.  His biventricular function still appears to be in- adequate at lower ECMO  flow.  Objective: Vital signs in last 24 hours: Temp:  [98.1 F (36.7 C)-98.6 F (37 C)] 98.4 F (36.9 C) (11/24 0800) Pulse Rate:  [31-96] 80 (11/24 0800) Cardiac Rhythm: A-V Sequential paced (11/24 0800) Resp:  [15-25] 21 (11/24 0800) BP: (86-100)/(72-87) 100/87 (11/24 0800) SpO2:  [86 %-100 %] 99 % (11/24 0800) Arterial Line BP: (87-160)/(55-95) 101/87 (11/24 0800) FiO2 (%):  [50 %] 50 % (11/24 0800) Weight:  [95.9 kg] 95.9 kg (11/24 0500)  Hemodynamic parameters for last 24 hours: PAP: (11-26)/(6-17) 22/11 CVP:  [4 mmHg-20 mmHg] 9 mmHg  Intake/Output from previous day: 11/23 0701 - 11/24 0700 In: 4106.8 [I.V.:1133; NG/GT:2000; IV Piggyback:973.8] Out: 6080 [Urine:5870; Chest Tube:110] Intake/Output this shift: Total I/O In: 269.7 [I.V.:59.7; NG/GT:40; IV Piggyback:170] Out: 730 [Urine:700; Chest Tube:30]  Sedated on ventilator having some hiccups Lungs with scattered rales Abdomen soft without guarding [employer] cover over mediastinum is flat and decompressed Extremities are warm but with some mottling of the digits  Lab Results: Recent Labs    04/29/23 1628 04/29/23 1637 04/30/23 0328 04/30/23 0343 04/30/23 0614  WBC 8.4  --  7.3  --   --   HGB 9.5*   < > 8.5* 15.3 8.8*  HCT 28.5*   < > 25.9* 45.0 26.0*  PLT 71*  --  59*  --   --    < > = values in this interval not displayed.   BMET:  Recent Labs     04/29/23 1628 04/29/23 1637 04/30/23 0328 04/30/23 0343 04/30/23 0614  NA 147*   < > 146* 147* 148*  K 4.2   < > 3.3* 3.3* 4.0  CL 106  --  107  --  107  CO2 33*  --  29  --   --   GLUCOSE 140*  --  150*  --  140*  BUN 28*  --  31*  --  34*  CREATININE 1.45*  --  1.26*  --  1.20  CALCIUM 8.5*  --  8.8*  --   --    < > = values in this interval not displayed.    PT/INR:  Recent Labs    04/30/23 0328  LABPROT 15.8*  INR 1.2   ABG    Component Value Date/Time   PHART 7.472 (H) 04/30/2023 0343   HCO3 34.0 (H) 04/30/2023 0343   TCO2 31 04/30/2023 0614   ACIDBASEDEF 2.0 04/29/2023 2237   O2SAT 100 04/30/2023 0343   CBG (last 3)  Recent Labs    04/29/23 2008 04/29/23 2333 04/30/23 0326  GLUCAP 148* 163* 138*    Assessment/Plan: S/P Procedure(s) (LRB): EXPLORATION POST OPERATIVE OPEN HEART (N/A) TRANSESOPHAGEAL ECHOCARDIOGRAM (TEE) (N/A) Plan takeback to the OR for mediastinal washout tomorrow and examine the biventricular function, status of vein grafts   LOS: 4 days    Lovett Sox 04/30/2023

## 2023-04-30 NOTE — Procedures (Signed)
Bronchoscopy Procedure Note  Samyog Moultrie  [id number]  Mar 01, 1966  Date:04/30/23  Time:11:49 AM   Provider Performing:Hannie Shoe P Chestine Spore   Procedure(s):  Flexible bronchoscopy with bronchial alveolar lavage (01027)  Indication(s) Mucus plugging  Consent Risks of the procedure as well as the alternatives and risks of each were explained to the patient and/or caregiver.  Consent for the procedure was obtained and is signed in the bedside chart  Anesthesia Per MAR   Time Out Verified patient identification, verified procedure, site/side was marked, verified correct patient position, special equipment/implants available, medications/allergies/relevant history reviewed, required imaging and test results available.   Sterile Technique Usual hand hygiene, masks, gowns, and gloves were used   Procedure Description Bronchoscope advanced through endotracheal tube and into airway.  Airways were examined down to subsegmental level with findings noted below.   Following diagnostic evaluation, BAL(s) performed in LLL with normal saline and return of 15cc fluid  Findings: Thick secretions in distal ETT, but no purulent secretions in airways. No significant erythema or airway abnormalities. BAL from LLL mostly clear, some chunks of blood-tinged mucus mixed in.    Complications/Tolerance None; patient tolerated the procedure well. Chest X-ray is not needed post procedure.   EBL Minimal   Specimen(s) BAL LLL for culture, cell count  Steffanie Dunn, DO 04/30/23 11:51 AM  Pulmonary & Critical Care

## 2023-04-30 NOTE — Progress Notes (Signed)
NAME:  Johnathan Anderson, MRN:  [id number], DOB:  June 09, 1965, LOS: 4 ADMISSION DATE:  04/26/2023, CONSULTATION DATE:  04/26/23 REFERRING MD:  Dr. Leafy Ro, CHIEF COMPLAINT:  postop   History of Present Illness:  Pt encephalopathic, therefore HPI obtained from EMR.    80 yoM with hx of bicuspid AV w/ AS, aortic aneurysm, HTN, HLD GERD, and former smoker who was admitted for elective AVR.  Pt followed for progressive bicuspid aortic stenosis and moderate ascending aortic aneurysm.  Asymptomatic.  Recent echo showing normal LV function and mean gradient of 39 mmHg.  No evidence of CAD or PHTN on preop workup.  Pt elected to repair issue now prior to becoming symptomatic.   Underwent elective bioprosthetic AVR with bental procedure complicated by possible cardioplegia vs coronary insufficiency coming off bypass requiring CABG x 2, RSVG from aortic graft to RCA and LAD and remains paced.   EBL 1250 Pump time 1005- 1428 S/p FFP x2, plts, cell saver 693, UOP 1100, paralytics not reversed, given methadone   ACT prior to ICU, 145 after second protamine Arrived on epi  Pertinent  Medical History  Bicuspid AV w/ AS, aortic aneurysm, HTN, GERD, HLD, vertigo, former smoker. Quit smoking 20 years ago.   Significant Hospital Events: Including procedures, antibiotic start and stop dates in addition to other pertinent events   11/20 AVR/ bental procedure & CABG X 2, VT postop, back to OR, centrally cannulated for ecmo 11/21 washout in OR  Interim History / Subjective:  Hiccups overnight, started baclofen. Thick ETT secretions.   Objective   Blood pressure 97/79, pulse 78, temperature 98.6 F (37 C), resp. rate 19, height 5\' 6"  (1.676 m), weight 95.9 kg, SpO2 100%. PAP: (11-26)/(6-17) 20/11 CVP:  [4 mmHg-20 mmHg] 8 mmHg  Vent Mode: PRVC FiO2 (%):  [50 %] 50 % Set Rate:  [18 bmp] 18 bmp Vt Set:  [470 mL-500 mL] 500 mL PEEP:  [8 cmH20] 8 cmH20 Plateau Pressure:  [15 cmH20-30 cmH20] 23 cmH20    Intake/Output Summary (Last 24 hours) at 04/30/2023 0734 Last data filed at 04/30/2023 0700 Gross per 24 hour  Intake 4106.76 ml  Output 6080 ml  Net -1973.24 ml   Filed Weights   04/28/23 0500 04/29/23 0532 04/30/23 0500  Weight: 100.5 kg 93 kg 95.9 kg   Examination: General: critically ill appearing man lying in bed in NAD HEENT: [place]/AT, eyes anicteric, ETT in place Neuro:  RASS -5, hiccups. CV: paced rhythm, chest tube some increased accumulation below dressing. Central cannulation. PULM:  thick secretions from ETT, decreased basilar breaths GI: soft, NT Extremities: pitting edema, dusky distal toes, cooler than previous exams. Skin: warm, dry, no rashes  7.47/46/208/34 Coox 69% Na+ 146 K+ 3.3 BUN 31 Cr 1.26 AST 108 ALT 44 T bili 5.3 LA 0.8 WBC 7.3 H/H 8.5/25.9 Platelets 59  CXR personally reviewed> median LLL retrocardiac opacity, ETT about 2 cm above carina. Small R lateral pneumothorax.   LDH 633 Ptt  42 Fibrinogen 592  Vent PRVC 18/500/8/50%  Pplat 24 VA ECMO 3650 RPM> 4.1L  , 4.5L sweep, 80% P ven -34 P int 309 Delta P 23    Resolved Hospital Problem list    Assessment & Plan:   Bicuspid AV with severe AS and ascending aortic aneurysm s/p AVR/ Bental procedure) complicated by post-op coagulopathy and bleeding, VT arrest, acute RV failure and cardiogenic shock requiring VA ECMO cannulation.  S/p CABG x 2 (RSVG from aortic graft to RCA and LAD via  right and left greater saphenous vein harvest)  Acute biventricular failure VT cardiac arrest -VA ECMO -aspirin, statin -open chest precautions; has chugging with any movement or coughing. Roc PRN. -con't swan -dopamine 2, con't NE to maintain MAP >70 -lasix gtt -check TEG  Severe post-op consumptive coagulopathy & thrombocytopenia ABLA- stable today Consumptive thrombocytopenia -holding AC today with dropping platelets; TEG WNL still.  -holding aspirin  Acute respiratory failure with hypoxia  requiring MV; increasing pulmonary edema with increased Pplat> now on slightly lower Vt/ -LTVV, meeting goal at pplat 24 -con't PEEP 8; titrate FiO2 as needed to maintain oxygenation. Circuit blender at 80%.  -PAD protocol for goal RASS -4 to -5 & no coughing. -VAP prevention protocol -hypertonic saline nebs BID -not stable for SAT & SBT -hypertonic saline, -bronch today for sample, cleanout  Hiccups -gabapentin, no response to baclofen earlier  AKI, expected with post-op hypotension; improving.  -lasix gtt -maintain adequate perfusion -Renally dose meds, avoid nephrotoxic meds  HTN HLD -con't holding PTA meds  GERD -trickle TF, PPI  Hyperglycemia, controlled -SSI PRN -goal BG 140-180  Hypernatremia -con't FWF, monitor  Shock liver -Maintain perfusion, diuresis  History of tobacco abuse, quit 20 years ago. No baseline COPD.   Best Practice (right click and "Reselect all SmartList Selections" daily)   Diet/type: tubefeeds DVT prophylaxis: SCD GI prophylaxis: PPI Lines: Central line and Arterial Line Foley:  Yes, and it is still needed Code Status:  full code Last date of multidisciplinary goals of care discussion [per primary team]  Labs   CBC: Recent Labs  Lab 04/28/23 1655 04/28/23 1700 04/28/23 2323 04/28/23 2328 04/29/23 0420 04/29/23 0605 04/29/23 1628 04/29/23 1637 04/29/23 2237 04/30/23 0145 04/30/23 0328 04/30/23 0343 04/30/23 0614  WBC 10.1  --  10.4  --  9.5  --  8.4  --   --   --  7.3  --   --   HGB 9.6*   < > 10.3*   < > 9.4*   < > 9.5*   < > 6.5* 9.2* 8.5* 15.3 8.8*  HCT 28.3*   < > 30.1*   < > 28.2*   < > 28.5*   < > 19.0* 27.0* 25.9* 45.0 26.0*  MCV 85.0  --  86.5  --  87.0  --  88.8  --   --   --  88.1  --   --   PLT 90*  --  88*  --  81*  --  71*  --   --   --  59*  --   --    < > = values in this interval not displayed.    Basic Metabolic Panel: Recent Labs  Lab 04/27/23 1553 04/27/23 1642 04/28/23 0304 04/28/23 0506  04/28/23 2323 04/28/23 2328 04/29/23 0420 04/29/23 8119 04/29/23 1143 04/29/23 1628 04/29/23 1637 04/29/23 2237 04/30/23 0145 04/30/23 0328 04/30/23 0343 04/30/23 0614  NA 153*   < > 155*   < > 152*   < > 151*   < > 149* 147*   < > 154* 151* 146* 147* 148*  K 3.6   < > 4.0   < > 3.9   < > 4.2   < > 3.7 4.2   < > 2.7* 3.6 3.3* 3.3* 4.0  CL 111  --  115*   < > 112*  --  110  --  108 106  --   --   --  107  --  107  CO2 26  --  32   < > 33*  --  32  --  34* 33*  --   --   --  29  --   --   GLUCOSE 189*  --  127*   < > 153*  --  161*  --  138* 140*  --   --   --  150*  --  140*  BUN 14  --  15   < > 21*  --  23*  --  26* 28*  --   --   --  31*  --  34*  CREATININE 1.86*  --  1.43*   < > 1.44*  --  1.57*  --  1.45* 1.45*  --   --   --  1.26*  --  1.20  CALCIUM 7.9*  --  7.9*   < > 8.4*  --  8.4*  --  8.6* 8.5*  --   --   --  8.8*  --   --   MG 1.7  --  2.6*  --  2.4  --  2.3  --   --   --   --   --   --  2.0  --   --   PHOS  --   --   --   --  3.4  --  2.8  --   --   --   --   --   --  1.1*  --   --    < > = values in this interval not displayed.   GFR: Estimated Creatinine Clearance: 71.8 mL/min (by C-G formula based on SCr of 1.2 mg/dL). Recent Labs  Lab 04/28/23 0003 04/28/23 0304 04/28/23 0510 04/28/23 1045 04/28/23 2323 04/29/23 0420 04/29/23 0620 04/29/23 1628 04/30/23 0328 04/30/23 0622  WBC  --    < >  --    < > 10.4 9.5  --  8.4 7.3  --   LATICACIDVEN 1.5  --  0.9  --   --   --  0.8  --   --  0.7   < > = values in this interval not displayed.    Liver Function Tests: Recent Labs  Lab 04/26/23 2104 04/27/23 0102 04/28/23 0304 04/29/23 0420 04/30/23 0328  AST 370* 393* 271* 170* 108*  ALT 67* 65* 50* 54* 44  ALKPHOS 16* 10* 25* 36* 52  BILITOT 2.0* 2.5* 3.0* 4.8* 5.3*  PROT 3.1* 3.5* 4.0* 5.4* 5.4*  ALBUMIN 2.3* 2.8* 2.5* 3.3* 3.3*   Critical care time:      This patient is critically ill with multiple organ system failure which requires frequent high  complexity decision making, assessment, support, evaluation, and titration of therapies. This was completed through the application of advanced monitoring technologies and extensive interpretation of multiple databases. During this encounter critical care time was devoted to patient care services described in this note for 45 minutes.  Steffanie Dunn, DO 04/30/23 7:34 AM [facility]  For contact information, see Amion. If no response to pager, please call PCCM consult pager. After hours, 7PM- 7AM, please call Elink.

## 2023-05-01 ENCOUNTER — Inpatient Hospital Stay (HOSPITAL_COMMUNITY): Payer: Self-pay | Admitting: Certified Registered Nurse Anesthetist

## 2023-05-01 ENCOUNTER — Encounter (HOSPITAL_COMMUNITY)
Admission: RE | Disposition: A | Discharge status: Hospice | Payer: Self-pay | Source: Home / Self Care | Attending: Thoracic Surgery (Cardiothoracic Vascular Surgery)

## 2023-05-01 ENCOUNTER — Inpatient Hospital Stay (HOSPITAL_COMMUNITY): Payer: Commercial Managed Care - HMO

## 2023-05-01 ENCOUNTER — Inpatient Hospital Stay (HOSPITAL_COMMUNITY): Payer: Commercial Managed Care - HMO | Admitting: Certified Registered Nurse Anesthetist

## 2023-05-01 DIAGNOSIS — I10 Essential (primary) hypertension: Secondary | ICD-10-CM | POA: Diagnosis not present

## 2023-05-01 DIAGNOSIS — R570 Cardiogenic shock: Secondary | ICD-10-CM

## 2023-05-01 DIAGNOSIS — I472 Ventricular tachycardia, unspecified: Secondary | ICD-10-CM | POA: Diagnosis not present

## 2023-05-01 DIAGNOSIS — J9601 Acute respiratory failure with hypoxia: Secondary | ICD-10-CM | POA: Diagnosis not present

## 2023-05-01 DIAGNOSIS — E870 Hyperosmolality and hypernatremia: Secondary | ICD-10-CM

## 2023-05-01 DIAGNOSIS — K219 Gastro-esophageal reflux disease without esophagitis: Secondary | ICD-10-CM

## 2023-05-01 HISTORY — PX: TEE WITHOUT CARDIOVERSION: SHX5443

## 2023-05-01 HISTORY — PX: STERNAL WOUND DEBRIDEMENT: SHX1058

## 2023-05-01 LAB — BASIC METABOLIC PANEL
Anion gap: 8 (ref 5–15)
Anion gap: 11 (ref 5–15)
Anion gap: 7 (ref 5–15)
BUN: 38 mg/dL — ABNORMAL HIGH (ref 6–20)
BUN: 38 mg/dL — ABNORMAL HIGH (ref 6–20)
BUN: 38 mg/dL — ABNORMAL HIGH (ref 6–20)
CO2: 31 mmol/L (ref 22–32)
CO2: 29 mmol/L (ref 22–32)
CO2: 29 mmol/L (ref 22–32)
Calcium: 9 mg/dL (ref 8.9–10.3)
Calcium: 9.1 mg/dL (ref 8.9–10.3)
Calcium: 8.9 mg/dL (ref 8.9–10.3)
Chloride: 104 mmol/L (ref 98–111)
Chloride: 107 mmol/L (ref 98–111)
Chloride: 106 mmol/L (ref 98–111)
Creatinine, Ser: 1.19 mg/dL (ref 0.61–1.24)
Creatinine, Ser: 1.23 mg/dL (ref 0.61–1.24)
Creatinine, Ser: 1.26 mg/dL — ABNORMAL HIGH (ref 0.61–1.24)
GFR, Estimated: >60 mL/min (ref >60)
GFR, Estimated: >60 mL/min (ref >60)
GFR, Estimated: >60 mL/min (ref >60)
Glucose, Bld: 141 mg/dL — ABNORMAL HIGH (ref 70–99)
Glucose, Bld: 141 mg/dL — ABNORMAL HIGH (ref 70–99)
Glucose, Bld: 187 mg/dL — ABNORMAL HIGH (ref 70–99)
Potassium: 3.6 mmol/L (ref 3.5–5.1)
Potassium: 3.8 mmol/L (ref 3.5–5.1)
Potassium: 3.5 mmol/L (ref 3.5–5.1)
Sodium: 143 mmol/L (ref 135–145)
Sodium: 147 mmol/L — ABNORMAL HIGH (ref 135–145)
Sodium: 142 mmol/L (ref 135–145)

## 2023-05-01 LAB — POCT I-STAT 7, (LYTES, BLD GAS, ICA,H+H)
Acid-Base Excess: 5 mmol/L — ABNORMAL HIGH (ref 0.0–2.0)
Acid-Base Excess: 6 mmol/L — ABNORMAL HIGH (ref 0.0–2.0)
Acid-Base Excess: 7 mmol/L — ABNORMAL HIGH (ref 0.0–2.0)
Acid-Base Excess: 7 mmol/L — ABNORMAL HIGH (ref 0.0–2.0)
Acid-Base Excess: 7 mmol/L — ABNORMAL HIGH (ref 0.0–2.0)
Acid-Base Excess: 7 mmol/L — ABNORMAL HIGH (ref 0.0–2.0)
Acid-Base Excess: 8 mmol/L — ABNORMAL HIGH (ref 0.0–2.0)
Acid-Base Excess: 9 mmol/L — ABNORMAL HIGH (ref 0.0–2.0)
Bicarbonate: 28.7 mmol/L — ABNORMAL HIGH (ref 20.0–28.0)
Bicarbonate: 29.3 mmol/L — ABNORMAL HIGH (ref 20.0–28.0)
Bicarbonate: 30.2 mmol/L — ABNORMAL HIGH (ref 20.0–28.0)
Bicarbonate: 30.5 mmol/L — ABNORMAL HIGH (ref 20.0–28.0)
Bicarbonate: 30.6 mmol/L — ABNORMAL HIGH (ref 20.0–28.0)
Bicarbonate: 32.3 mmol/L — ABNORMAL HIGH (ref 20.0–28.0)
Bicarbonate: 33.1 mmol/L — ABNORMAL HIGH (ref 20.0–28.0)
Bicarbonate: 34.3 mmol/L — ABNORMAL HIGH (ref 20.0–28.0)
Calcium, Ion: 1.18 mmol/L (ref 1.15–1.40)
Calcium, Ion: 1.18 mmol/L (ref 1.15–1.40)
Calcium, Ion: 1.19 mmol/L (ref 1.15–1.40)
Calcium, Ion: 1.2 mmol/L (ref 1.15–1.40)
Calcium, Ion: 1.21 mmol/L (ref 1.15–1.40)
Calcium, Ion: 1.21 mmol/L (ref 1.15–1.40)
Calcium, Ion: 1.22 mmol/L (ref 1.15–1.40)
Calcium, Ion: 1.27 mmol/L (ref 1.15–1.40)
HCT: 24 % — ABNORMAL LOW (ref 39.0–52.0)
HCT: 25 % — ABNORMAL LOW (ref 39.0–52.0)
HCT: 25 % — ABNORMAL LOW (ref 39.0–52.0)
HCT: 25 % — ABNORMAL LOW (ref 39.0–52.0)
HCT: 27 % — ABNORMAL LOW (ref 39.0–52.0)
HCT: 28 % — ABNORMAL LOW (ref 39.0–52.0)
HCT: 37 % — ABNORMAL LOW (ref 39.0–52.0)
HCT: 47 % (ref 39.0–52.0)
Hemoglobin: 12.6 g/dL — ABNORMAL LOW (ref 13.0–17.0)
Hemoglobin: 16 g/dL (ref 13.0–17.0)
Hemoglobin: 8.2 g/dL — ABNORMAL LOW (ref 13.0–17.0)
Hemoglobin: 8.5 g/dL — ABNORMAL LOW (ref 13.0–17.0)
Hemoglobin: 8.5 g/dL — ABNORMAL LOW (ref 13.0–17.0)
Hemoglobin: 8.5 g/dL — ABNORMAL LOW (ref 13.0–17.0)
Hemoglobin: 9.2 g/dL — ABNORMAL LOW (ref 13.0–17.0)
Hemoglobin: 9.5 g/dL — ABNORMAL LOW (ref 13.0–17.0)
O2 Saturation: 100 %
O2 Saturation: 100 %
O2 Saturation: 100 %
O2 Saturation: 100 %
O2 Saturation: 98 %
O2 Saturation: 98 %
O2 Saturation: 99 %
O2 Saturation: 99 %
Patient temperature: 36.4
Patient temperature: 36.4
Patient temperature: 36.5
Patient temperature: 36.6
Patient temperature: 36.6
Patient temperature: 36.6
Patient temperature: 36.7
Patient temperature: 36.8
Potassium: 3.5 mmol/L (ref 3.5–5.1)
Potassium: 3.6 mmol/L (ref 3.5–5.1)
Potassium: 3.6 mmol/L (ref 3.5–5.1)
Potassium: 3.7 mmol/L (ref 3.5–5.1)
Potassium: 3.9 mmol/L (ref 3.5–5.1)
Potassium: 4 mmol/L (ref 3.5–5.1)
Potassium: 4.1 mmol/L (ref 3.5–5.1)
Potassium: 4.2 mmol/L (ref 3.5–5.1)
Sodium: 146 mmol/L — ABNORMAL HIGH (ref 135–145)
Sodium: 146 mmol/L — ABNORMAL HIGH (ref 135–145)
Sodium: 146 mmol/L — ABNORMAL HIGH (ref 135–145)
Sodium: 146 mmol/L — ABNORMAL HIGH (ref 135–145)
Sodium: 147 mmol/L — ABNORMAL HIGH (ref 135–145)
Sodium: 147 mmol/L — ABNORMAL HIGH (ref 135–145)
Sodium: 147 mmol/L — ABNORMAL HIGH (ref 135–145)
Sodium: 147 mmol/L — ABNORMAL HIGH (ref 135–145)
TCO2: 30 mmol/L (ref 22–32)
TCO2: 30 mmol/L (ref 22–32)
TCO2: 31 mmol/L (ref 22–32)
TCO2: 32 mmol/L (ref 22–32)
TCO2: 32 mmol/L (ref 22–32)
TCO2: 34 mmol/L — ABNORMAL HIGH (ref 22–32)
TCO2: 35 mmol/L — ABNORMAL HIGH (ref 22–32)
TCO2: 36 mmol/L — ABNORMAL HIGH (ref 22–32)
pCO2 arterial: 32.9 mm[Hg] (ref 32–48)
pCO2 arterial: 35.6 mm[Hg] (ref 32–48)
pCO2 arterial: 35.7 mm[Hg] (ref 32–48)
pCO2 arterial: 36.7 mm[Hg] (ref 32–48)
pCO2 arterial: 38.7 mm[Hg] (ref 32–48)
pCO2 arterial: 49.8 mm[Hg] — ABNORMAL HIGH (ref 32–48)
pCO2 arterial: 50.6 mm[Hg] — ABNORMAL HIGH (ref 32–48)
pCO2 arterial: 51.6 mm[Hg] — ABNORMAL HIGH (ref 32–48)
pH, Arterial: 7.413 (ref 7.35–7.45)
pH, Arterial: 7.419 (ref 7.35–7.45)
pH, Arterial: 7.439 (ref 7.35–7.45)
pH, Arterial: 7.476 — ABNORMAL HIGH (ref 7.35–7.45)
pH, Arterial: 7.523 — ABNORMAL HIGH (ref 7.35–7.45)
pH, Arterial: 7.527 — ABNORMAL HIGH (ref 7.35–7.45)
pH, Arterial: 7.533 — ABNORMAL HIGH (ref 7.35–7.45)
pH, Arterial: 7.574 — ABNORMAL HIGH (ref 7.35–7.45)
pO2, Arterial: 110 mm[Hg] — ABNORMAL HIGH (ref 83–108)
pO2, Arterial: 146 mm[Hg] — ABNORMAL HIGH (ref 83–108)
pO2, Arterial: 148 mm[Hg] — ABNORMAL HIGH (ref 83–108)
pO2, Arterial: 153 mm[Hg] — ABNORMAL HIGH (ref 83–108)
pO2, Arterial: 164 mm[Hg] — ABNORMAL HIGH (ref 83–108)
pO2, Arterial: 166 mm[Hg] — ABNORMAL HIGH (ref 83–108)
pO2, Arterial: 406 mm[Hg] — ABNORMAL HIGH (ref 83–108)
pO2, Arterial: 98 mm[Hg] (ref 83–108)

## 2023-05-01 LAB — CBC
HCT: 28.8 % — ABNORMAL LOW (ref 39.0–52.0)
HCT: 26.9 % — ABNORMAL LOW (ref 39.0–52.0)
HCT: 25.7 % — ABNORMAL LOW (ref 39.0–52.0)
Hemoglobin: 9.3 g/dL — ABNORMAL LOW (ref 13.0–17.0)
Hemoglobin: 8.8 g/dL — ABNORMAL LOW (ref 13.0–17.0)
Hemoglobin: 8.4 g/dL — ABNORMAL LOW (ref 13.0–17.0)
MCH: 29.1 pg (ref 26.0–34.0)
MCH: 28.9 pg (ref 26.0–34.0)
MCH: 29.3 pg (ref 26.0–34.0)
MCHC: 32.3 g/dL (ref 30.0–36.0)
MCHC: 32.7 g/dL (ref 30.0–36.0)
MCHC: 32.7 g/dL (ref 30.0–36.0)
MCV: 90 fL (ref 80.0–100.0)
MCV: 88.5 fL (ref 80.0–100.0)
MCV: 89.5 fL (ref 80.0–100.0)
Platelets: 50 10*3/uL — ABNORMAL LOW (ref 150–400)
Platelets: 49 10*3/uL — ABNORMAL LOW (ref 150–400)
Platelets: 68 10*3/uL — ABNORMAL LOW (ref 150–400)
RBC: 3.2 MIL/uL — ABNORMAL LOW (ref 4.22–5.81)
RBC: 3.04 MIL/uL — ABNORMAL LOW (ref 4.22–5.81)
RBC: 2.87 MIL/uL — ABNORMAL LOW (ref 4.22–5.81)
RDW: 15.5 % (ref 11.5–15.5)
RDW: 15.3 % (ref 11.5–15.5)
RDW: 15.4 % (ref 11.5–15.5)
WBC: 8.9 10*3/uL (ref 4.0–10.5)
WBC: 8.9 10*3/uL (ref 4.0–10.5)
WBC: 9.4 10*3/uL (ref 4.0–10.5)
nRBC: 0.3 % — ABNORMAL HIGH (ref 0.0–0.2)
nRBC: 0.5 % — ABNORMAL HIGH (ref 0.0–0.2)
nRBC: 0.2 % (ref 0.0–0.2)

## 2023-05-01 LAB — BPAM PLATELET PHERESIS
Blood Product Expiration Date: 202411262359
Blood Product Expiration Date: 202411272359
ISSUE DATE / TIME: 202411251432
ISSUE DATE / TIME: 202411251437
Unit Type and Rh: 5100
Unit Type and Rh: 6200

## 2023-05-01 LAB — GLUCOSE, CAPILLARY
Glucose-Capillary: 143 mg/dL — ABNORMAL HIGH (ref 70–99)
Glucose-Capillary: 120 mg/dL — ABNORMAL HIGH (ref 70–99)
Glucose-Capillary: 146 mg/dL — ABNORMAL HIGH (ref 70–99)
Glucose-Capillary: 132 mg/dL — ABNORMAL HIGH (ref 70–99)
Glucose-Capillary: 182 mg/dL — ABNORMAL HIGH (ref 70–99)

## 2023-05-01 LAB — COOXEMETRY PANEL
Carboxyhemoglobin: 1.9 % — ABNORMAL HIGH (ref 0.5–1.5)
Methemoglobin: <0.7 % (ref 0.0–1.5)
O2 Saturation: 64 %
Total hemoglobin: 9.5 g/dL — ABNORMAL LOW (ref 12.0–16.0)

## 2023-05-01 LAB — ECHO INTRAOPERATIVE TEE
Height: 66 in
Weight: 3343.94 [oz_av]

## 2023-05-01 LAB — HEPATIC FUNCTION PANEL
ALT: 45 U/L — ABNORMAL HIGH (ref 0–44)
AST: 90 U/L — ABNORMAL HIGH (ref 15–41)
Albumin: 3.1 g/dL — ABNORMAL LOW (ref 3.5–5.0)
Alkaline Phosphatase: 67 U/L (ref 38–126)
Bilirubin, Direct: 2.6 mg/dL — ABNORMAL HIGH (ref 0.0–0.2)
Indirect Bilirubin: 2.5 mg/dL — ABNORMAL HIGH (ref 0.3–0.9)
Total Bilirubin: 5.1 mg/dL — ABNORMAL HIGH (ref <1.2)
Total Protein: 5.9 g/dL — ABNORMAL LOW (ref 6.5–8.1)

## 2023-05-01 LAB — FIBRINOGEN: Fibrinogen: 658 mg/dL — ABNORMAL HIGH (ref 210–475)

## 2023-05-01 LAB — PREPARE PLATELET PHERESIS
Unit division: 0
Unit division: 0

## 2023-05-01 LAB — PROTIME-INR
INR: 1.2 (ref 0.8–1.2)
Prothrombin Time: 15.1 s (ref 11.4–15.2)

## 2023-05-01 LAB — LACTATE DEHYDROGENASE: LDH: 618 U/L — ABNORMAL HIGH (ref 98–192)

## 2023-05-01 LAB — APTT: aPTT: 40 s — ABNORMAL HIGH (ref 24–36)

## 2023-05-01 LAB — PHOSPHORUS: Phosphorus: 2.5 mg/dL (ref 2.5–4.6)

## 2023-05-01 LAB — PREPARE RBC (CROSSMATCH)

## 2023-05-01 LAB — MAGNESIUM: Magnesium: 2.2 mg/dL (ref 1.7–2.4)

## 2023-05-01 LAB — CG4 I-STAT (LACTIC ACID): Lactic Acid, Venous: 0.8 mmol/L (ref 0.5–1.9)

## 2023-05-01 SURGERY — DEBRIDEMENT, WOUND, STERNUM
Anesthesia: General

## 2023-05-01 MED ORDER — VANCOMYCIN HCL 1000 MG IV SOLR
INTRAVENOUS | Status: DC | PRN
  Administered 2023-05-01: 1 g

## 2023-05-01 MED ORDER — FUROSEMIDE 10 MG/ML IJ SOLN: 4.0000 mg/h | INTRAVENOUS | Status: DC

## 2023-05-01 MED ORDER — VANCOMYCIN HCL 1000 MG IV SOLR
INTRAVENOUS | Status: AC
  Filled 2023-05-01: qty 20

## 2023-05-01 MED ORDER — SODIUM CHLORIDE 0.9 % IV SOLN: 1.0000 g | Freq: Three times a day (TID) | INTRAVENOUS | Status: DC

## 2023-05-01 MED ORDER — SODIUM CHLORIDE 0.9 % IV SOLN: 0.5000 mg/h | INTRAVENOUS | Status: DC

## 2023-05-01 MED ORDER — MORPHINE SULFATE (PF) 2 MG/ML IV SOLN: 1.0000 mg | INTRAVENOUS | Status: DC | PRN

## 2023-05-01 MED ORDER — PIVOT 1.5 CAL PO LIQD: 1000.0000 mL | ORAL | Status: DC

## 2023-05-01 MED ORDER — SODIUM CHLORIDE 0.9 % IV SOLN: 25.0000 mg | Freq: Three times a day (TID) | INTRAVENOUS | Status: DC | PRN

## 2023-05-01 MED ORDER — PROPOFOL 1000 MG/100ML IV EMUL
INTRAVENOUS | Status: AC
  Filled 2023-05-01: qty 100

## 2023-05-01 MED ORDER — CHLORHEXIDINE GLUCONATE CLOTH 2 % EX PADS: 6.0000 | MEDICATED_PAD | Freq: Every day | CUTANEOUS | Status: DC

## 2023-05-01 MED ORDER — CEFAZOLIN SODIUM-DEXTROSE 2-3 GM-%(50ML) IV SOLR
INTRAVENOUS | Status: DC | PRN
  Administered 2023-05-01: 2 g via INTRAVENOUS

## 2023-05-01 MED ORDER — HYDROMORPHONE BOLUS VIA INFUSION: 1.0000 mg | INTRAVENOUS | Status: DC | PRN

## 2023-05-01 MED ORDER — VANCOMYCIN HCL 1250 MG/250ML IV SOLN: 1250.0000 mg | Freq: Two times a day (BID) | INTRAVENOUS | Status: DC

## 2023-05-01 MED ORDER — 0.9 % SODIUM CHLORIDE (POUR BTL) OPTIME
TOPICAL | Status: DC | PRN
  Administered 2023-05-01: 4000 mL

## 2023-05-01 MED ORDER — ONDANSETRON HCL 4 MG/2ML IJ SOLN: 4.0000 mg | Freq: Four times a day (QID) | INTRAMUSCULAR | Status: DC | PRN

## 2023-05-01 MED ORDER — DOCUSATE SODIUM 50 MG/5ML PO LIQD: 100.0000 mg | Freq: Two times a day (BID) | ORAL | Status: DC

## 2023-05-01 MED ORDER — PIVOT 1.5 CAL PO LIQD
1000.0000 mL | ORAL | Status: DC
  Administered 2023-05-01: 1000 mL

## 2023-05-01 MED ORDER — PROSOURCE TF20 ENFIT COMPATIBL EN LIQD: 60.0000 mL | Freq: Two times a day (BID) | ENTERAL | Status: DC

## 2023-05-01 MED ORDER — SODIUM CHLORIDE 0.9% IV SOLUTION: Freq: Once | INTRAVENOUS | Status: AC

## 2023-05-01 MED ORDER — ROCURONIUM BROMIDE 10 MG/ML (PF) SYRINGE
PREFILLED_SYRINGE | INTRAVENOUS | Status: DC | PRN
  Administered 2023-05-01: 100 mg via INTRAVENOUS

## 2023-05-01 MED ORDER — SODIUM CHLORIDE 0.9 % IV SOLN
25.0000 mg | Freq: Three times a day (TID) | INTRAVENOUS | Status: DC | PRN
  Administered 2023-05-01 (×2): 25 mg via INTRAVENOUS
  Filled 2023-05-01 (×2): qty 1

## 2023-05-01 MED ORDER — FREE WATER: 200.0000 mL | Status: DC

## 2023-05-01 MED ORDER — POTASSIUM CHLORIDE 10 MEQ/50ML IV SOLN
10.0000 meq | INTRAVENOUS | Status: AC
  Administered 2023-05-01 (×4): 10 meq via INTRAVENOUS
  Filled 2023-05-01 (×4): qty 50

## 2023-05-01 MED ORDER — OXYCODONE HCL 5 MG PO TABS: 5.0000 mg | ORAL_TABLET | ORAL | Status: DC | PRN

## 2023-05-01 MED ORDER — POTASSIUM CHLORIDE 20 MEQ PO PACK
20.0000 meq | PACK | ORAL | Status: AC
Start: 2023-05-01 — End: 2023-05-01
  Administered 2023-05-01 (×3): 20 meq
  Filled 2023-05-01 (×3): qty 1

## 2023-05-01 MED ORDER — HYDROMORPHONE BOLUS VIA INFUSION: 0.2500 mg | INTRAVENOUS | Status: DC | PRN

## 2023-05-01 MED ORDER — POLYETHYLENE GLYCOL 3350 17 G PO PACK: 17.0000 g | PACK | Freq: Every day | ORAL | Status: DC

## 2023-05-01 MED ORDER — INSULIN ASPART 100 UNIT/ML IJ SOLN: 0.0000 [IU] | INTRAMUSCULAR | Status: DC

## 2023-05-01 MED ORDER — PANTOPRAZOLE SODIUM 40 MG IV SOLR: 40.0000 mg | Freq: Every day | INTRAVENOUS | Status: DC

## 2023-05-01 MED ORDER — ALBUMIN HUMAN 5 % IV SOLN
12.5000 g | INTRAVENOUS | Status: DC | PRN
  Filled 2023-05-01: qty 250

## 2023-05-01 MED ORDER — SODIUM CHLORIDE 0.9% IV SOLUTION: Freq: Once | INTRAVENOUS | Status: DC

## 2023-05-01 MED ORDER — NOREPINEPHRINE 16 MG/250ML-% IV SOLN: 0.0000 ug/min | INTRAVENOUS | Status: DC

## 2023-05-01 MED ORDER — VANCOMYCIN HCL 1000 MG IV SOLR
INTRAVENOUS | Status: AC
Start: 2023-05-01 — End: ?
  Filled 2023-05-01: qty 20

## 2023-05-01 MED ORDER — PROSOURCE TF20 ENFIT COMPATIBL EN LIQD
60.0000 mL | Freq: Two times a day (BID) | ENTERAL | Status: DC
Start: 2023-05-01 — End: 2023-05-02
  Filled 2023-05-01: qty 60

## 2023-05-01 MED ORDER — MIDAZOLAM-SODIUM CHLORIDE 100-0.9 MG/100ML-% IV SOLN: 4.0000 mg/h | INTRAVENOUS | Status: DC

## 2023-05-01 MED ORDER — GABAPENTIN 250 MG/5ML PO SOLN: 200.0000 mg | Freq: Three times a day (TID) | ORAL | Status: DC

## 2023-05-01 MED ORDER — PROPOFOL 1000 MG/100ML IV EMUL: 5.0000 ug/kg/min | INTRAVENOUS | Status: DC

## 2023-05-01 MED ORDER — MIDAZOLAM BOLUS VIA INFUSION: 1.0000 mg | INTRAVENOUS | Status: DC | PRN

## 2023-05-01 MED ORDER — DOPAMINE-DEXTROSE 3.2-5 MG/ML-% IV SOLN: 3.0000 ug/kg/min | INTRAVENOUS | Status: DC

## 2023-05-01 SURGICAL SUPPLY — 48 items
BNDG GAUZE DERMACEA FLUFF 4 (GAUZE/BANDAGES/DRESSINGS) IMPLANT
CANISTER SUCT 3000ML PPV (MISCELLANEOUS) ×1 IMPLANT
CATH FOLEY 2WAY SLVR 5CC 16FR (CATHETERS) IMPLANT
CATH THORACIC 28FR RT ANG (CATHETERS) IMPLANT
CATH THORACIC 36FR (CATHETERS) IMPLANT
CLIP TI MEDIUM 24 (CLIP) IMPLANT
CLIP TI WIDE RED SMALL 24 (CLIP) IMPLANT
CNTNR URN SCR LID CUP LEK RST (MISCELLANEOUS) IMPLANT
CONN Y 3/8X3/8X3/8 BEN (MISCELLANEOUS) IMPLANT
CONTAINER PROTECT SURGISLUSH (MISCELLANEOUS) ×2 IMPLANT
COVER SURGICAL LIGHT HANDLE (MISCELLANEOUS) ×2 IMPLANT
DRAPE CV SPLIT W-CLR ANES SCRN (DRAPES) IMPLANT
DRAPE INCISE IOBAN 66X45 STRL (DRAPES) IMPLANT
DRAPE PERI GROIN 82X75IN TIB (DRAPES) IMPLANT
DRAPE SRG 135.5X100X77XCV INC (DRAPES) IMPLANT
DRAPE WARM FLUID 44X44 (DRAPES) IMPLANT
DRSG AQUACEL AG ADV 3.5X14 (GAUZE/BANDAGES/DRESSINGS) ×1 IMPLANT
ELECT BLADE 4.0 EZ CLEAN MEGAD (MISCELLANEOUS) ×1
ELECT REM PT RETURN 9FT ADLT (ELECTROSURGICAL) ×1
ELECTRODE BLDE 4.0 EZ CLN MEGD (MISCELLANEOUS) IMPLANT
ELECTRODE REM PT RTRN 9FT ADLT (ELECTROSURGICAL) ×1 IMPLANT
GAUZE PAD ABD 8X10 STRL (GAUZE/BANDAGES/DRESSINGS) IMPLANT
GAUZE SPONGE 4X4 12PLY STRL (GAUZE/BANDAGES/DRESSINGS) ×1 IMPLANT
GAUZE XEROFORM 5X9 LF (GAUZE/BANDAGES/DRESSINGS) IMPLANT
GLOVE BIO SURGEON STRL SZ7.5 (GLOVE) ×1 IMPLANT
GOWN STRL REUS W/ TWL LRG LVL3 (GOWN DISPOSABLE) ×4 IMPLANT
HEMOSTAT SURGICEL 2X14 (HEMOSTASIS) IMPLANT
KIT BASIN OR (CUSTOM PROCEDURE TRAY) ×1 IMPLANT
KIT SUCTION CATH 14FR (SUCTIONS) IMPLANT
KIT TURNOVER KIT B (KITS) ×1 IMPLANT
NS IRRIG 1000ML POUR BTL (IV SOLUTION) ×1 IMPLANT
PACK CHEST (CUSTOM PROCEDURE TRAY) ×1 IMPLANT
PAD ARMBOARD 7.5X6 YLW CONV (MISCELLANEOUS) ×2 IMPLANT
SOL PREP POV-IOD 4OZ 10% (MISCELLANEOUS) IMPLANT
SUT ETHILON 3 0 FSL (SUTURE) IMPLANT
SUT STEEL 6MS V (SUTURE) IMPLANT
SUT STEEL STERNAL CCS#1 18IN (SUTURE) IMPLANT
SUT STEEL SZ 6 DBL 3X14 BALL (SUTURE) IMPLANT
SUT VIC AB 1 CTX36XBRD ANBCTR (SUTURE) ×2 IMPLANT
SUT VIC AB 2-0 CTX 27 (SUTURE) ×2 IMPLANT
SUT VIC AB 3-0 X1 27 (SUTURE) ×2 IMPLANT
SWAB COLLECTION DEVICE MRSA (MISCELLANEOUS) IMPLANT
SWAB CULTURE ESWAB REG 1ML (MISCELLANEOUS) IMPLANT
SYR 5ML LL (SYRINGE) IMPLANT
SYR BULB IRRIG 60ML STRL (SYRINGE) IMPLANT
TOWEL GREEN STERILE (TOWEL DISPOSABLE) ×1 IMPLANT
TOWEL GREEN STERILE FF (TOWEL DISPOSABLE) ×1 IMPLANT
WATER STERILE IRR 1000ML POUR (IV SOLUTION) ×1 IMPLANT

## 2023-05-01 NOTE — Progress Notes (Signed)
4 Days Post-Op Procedure(s) (LRB): EXPLORATION POST OPERATIVE OPEN HEART (N/A) TRANSESOPHAGEAL ECHOCARDIOGRAM (TEE) (N/A) Subjective: Patient examined and todays CXR image reviewed Minimal pulsatility ECMO flow impaired with persistent singultus Will return to OR for mediastinal washout later today Objective: Vital signs in last 24 hours: Temp:  [98.1 F (36.7 C)-98.4 F (36.9 C)] 98.4 F (36.9 C) (11/25 0700) Pulse Rate:  [67-125] 80 (11/25 0752) Cardiac Rhythm: A-V Sequential paced (11/25 0800) Resp:  [9-24] 18 (11/25 0700) BP: (91-93)/(82-84) 93/84 (11/24 1545) SpO2:  [85 %-100 %] 98 % (11/25 0800) Arterial Line BP: (82-108)/(73-98) 100/94 (11/25 0700) FiO2 (%):  [50 %] 50 % (11/25 0752) Weight:  [94.8 kg] 94.8 kg (11/25 0500)  Hemodynamic parameters for last 24 hours: PAP: (11-23)/(-6-16) 20/16 CVP:  [8 mmHg-16 mmHg] 9 mmHg  Intake/Output from previous day: 11/24 0701 - 11/25 0700 In: 3136.5 [I.V.:551.1; NG/GT:1337.3; IV Piggyback:1248.1] Out: 6040 [Urine:5840; Chest Tube:150] Intake/Output this shift: Total I/O In: 65.9 [I.V.:19.2; IV Piggyback:46.8] Out: 325 [Urine:325]  EXAM  Sedated on vent Open chest with concave Ioban covered well Extremities warm  Lab Results: Recent Labs    04/30/23 1618 04/30/23 1623 05/01/23 0405 05/01/23 0412 05/01/23 0900  WBC 8.2  --  8.9  --   --   HGB 9.0*   < > 9.3* 12.6* 9.2*  HCT 27.7*   < > 28.8* 37.0* 27.0*  PLT 54*  --  50*  --   --    < > = values in this interval not displayed.   BMET:  Recent Labs    04/30/23 1618 04/30/23 1623 05/01/23 0405 05/01/23 0412 05/01/23 0900  NA 145   < > 143 147* 146*  K 3.9   < > 3.6 3.6 4.2  CL 106  --  104  --   --   CO2 31  --  31  --   --   GLUCOSE 143*  --  141*  --   --   BUN 33*  --  38*  --   --   CREATININE 1.28*  --  1.19  --   --   CALCIUM 8.9  --  9.0  --   --    < > = values in this interval not displayed.    PT/INR:  Recent Labs    05/01/23 0405   LABPROT 15.1  INR 1.2   ABG    Component Value Date/Time   PHART 7.419 05/01/2023 0900   HCO3 32.3 (H) 05/01/2023 0900   TCO2 34 (H) 05/01/2023 0900   ACIDBASEDEF 2.0 04/29/2023 2237   O2SAT 99 05/01/2023 0900   CBG (last 3)  Recent Labs    04/30/23 2308 05/01/23 0404 05/01/23 0754  GLUCAP 146* 143* 120*    Assessment/Plan: S/P Procedure(s) (LRB): EXPLORATION POST OPERATIVE OPEN HEART (N/A) TRANSESOPHAGEAL ECHOCARDIOGRAM (TEE) (N/A) Mediastinal washout in OR today   LOS: 5 days    Lovett Sox 05/01/2023

## 2023-05-01 NOTE — Op Note (Unsigned)
[name], Johnathan Anderson MEDICAL RECORD NO: [id number] ACCOUNT NO: [id number] DATE OF BIRTH: 05-15-1966 FACILITY: [facility] LOCATION: [facility] PHYSICIAN: Kerin Perna III, MD  Operative Report   DATE OF PROCEDURE: 05/01/2023  OPERATION:  Mediastinal washout and repacking.  PREOPERATIVE DIAGNOSES:  Cardiogenic shock, the patient supported on VA ECMO through chest cannulas following Bentall procedure performed by Dr.  Leafy Ro 5 days ago.  POSTOPERATIVE DIAGNOSES:  Cardiogenic shock, the patient supported on VA ECMO through chest cannulas following Bentall procedure performed by Dr.  Leafy Ro 5 days ago.  DESCRIPTION OF PROCEDURE:  The patient was brought from the ICU with the ECMO team, anesthesia, and perfusion for mediastinal washout 5 days postop Bentall procedure, then ECMO cannulation through the chest.  The patient was in stable hemodynamic  condition, making urine with satisfactory creatinine, not acidotic, and without significant chest tube output.  Informed consent had been obtained from the family.  The patient was placed supine on the operating room table and connected to the monitoring devices.  He remained stable.  A transesophageal echo probe was placed by the anesthesia team, which was also reviewed by the heart failure cardiologist.  The  previous Ioban sheet over the open chest was removed.  The patient was prepped and draped from the neck to the groins and draped as a sterile field.  A proper timeout was performed.  The previously placed Kerlix roll in the mediastinum was removed after  moistening it with saline irrigation.  The mediastinum was examined.  There was minimal amount of thrombus and blood.  The chest tubes were patent.  The mediastinal was irrigated with warm vancomycin irrigation.  The vein grafts placed to the LAD and  right coronary were patent.  There was no active bleeding.  The TEE was used to assess biventricular function with temporary slowing of the ECMO flow.  It  was determined by Heart Failure Cardiology that the biventricular function remained very poor and  the patient was placed back on full VA ECMO support.  A new Kerlix roll was placed into the mediastinum soaked in warm irrigation with vancomycin.  A new Ioban sheet in several layers was placed over the open sternum, which was bridged apart by  previously configured 50 mL syringe.  The patient was then transported back to the ICU.  He was in stable condition.     VAI D: 05/01/2023 3:31:15 pm T: 05/01/2023 9:43:00 pm  JOB: [id number]/ [id number]

## 2023-05-01 NOTE — Anesthesia Postprocedure Evaluation (Signed)
Anesthesia Post Note  Patient: Johnathan Anderson  Procedure(s) Performed: MEDIAL STERNAL WOUND DEBRIDEMENT TRANSESOPHAGEAL ECHOCARDIOGRAM (TEE)     Patient location during evaluation: SICU Anesthesia Type: General Level of consciousness: sedated Pain management: pain level controlled Vital Signs Assessment: post-procedure vital signs reviewed and stable Respiratory status: patient remains intubated per anesthesia plan Cardiovascular status: stable Postop Assessment: no apparent nausea or vomiting Anesthetic complications: no   No notable events documented.  Last Vitals:  Vitals:   05/01/23 1513 05/01/23 1528  BP: 100/88   Pulse: 80 80  Resp: 18 18  Temp: (!) 35.9 C (!) 35.9 C  SpO2:      Last Pain:  Vitals:   05/01/23 1513  TempSrc: Core  PainSc:                  [employer] [place]

## 2023-05-01 NOTE — Progress Notes (Signed)
Pre Procedure note for inpatients:   Johnathan Anderson has been scheduled for Procedure(s): EXPLORATION POST OPERATIVE OPEN HEART (N/A) TRANSESOPHAGEAL ECHOCARDIOGRAM (TEE) (N/A) today. The various methods of treatment have been discussed with the patient. After consideration of the risks, benefits and treatment options the patient has consented to the planned procedure.   The patient has been seen and labs reviewed. There are no changes in the patient's condition to prevent proceeding with the planned procedure today.  Recent labs:  Lab Results  Component Value Date   WBC 8.9 05/01/2023   HGB 9.5 (L) 05/01/2023   HCT 28.0 (L) 05/01/2023   PLT 50 (L) 05/01/2023   GLUCOSE 141 (H) 05/01/2023   CHOL 133 12/20/2021   TRIG 116 12/20/2021   HDL 40 12/20/2021   LDLCALC 72 12/20/2021   ALT 45 (H) 05/01/2023   AST 90 (H) 05/01/2023   NA 146 (H) 05/01/2023   K 4.1 05/01/2023   CL 104 05/01/2023   CREATININE 1.19 05/01/2023   BUN 38 (H) 05/01/2023   CO2 31 05/01/2023   INR 1.2 05/01/2023   HGBA1C 5.5 04/24/2023    Lovett Sox, MD 05/01/2023 12:57 PM

## 2023-05-01 NOTE — Progress Notes (Signed)
ECMO NOTE:   Indication: Cardiogenic shock due to RV faliure   Initial cannulation date: 11/20   ECMO type: VA ECMO   Dual lumen inflow/return cannula:   1) Two stage venous return cannula in the right atrium 1) 20Fr Sarns cannula in the aorta    ECMO events:   - Initial cannulation 04/26/23    Daily data:   Flow 4.05L RPM 3650 Sweep  4.5L Pint 307 Pven -42  Streaking clot from 12 down    Plan:  - OR today for wash out.  - Holding anticoagulation.  - Possible transfer to [facility] for transplant evaluation.   Discussed in multidisciplinary fashion on ECMO rounds with CCM, Cardiology, ECMO coordinator/specialist, RT, PharmD and nursing staff all present.    Cecil Vandyke, DO  8:03 AM

## 2023-05-01 NOTE — Progress Notes (Signed)
Advanced Heart Failure Rounding Note  PCP-Cardiologist: Christell Constant, MD   Subjective:   - No acute events overnight.  - Receiving rocuronium PRN for hiccups.  - No underlying A-V conduction when reducing pacing.  - CVP 8-10 on lasix gtt with 6L urine output yesterday (-3L) - CXR clear.   Objective:   Weight Range: 94.8 kg Body mass index is 33.73 kg/m.   Vital Signs:   Temp:  [98.1 F (36.7 C)-98.4 F (36.9 C)] 98.4 F (36.9 C) (11/25 0700) Pulse Rate:  [67-125] 80 (11/25 0752) Resp:  [9-24] 18 (11/25 0700) BP: (91-93)/(82-84) 93/84 (11/24 1545) SpO2:  [85 %-100 %] 98 % (11/25 0752) Arterial Line BP: (82-108)/(73-98) 100/94 (11/25 0700) FiO2 (%):  [50 %] 50 % (11/25 0752) Weight:  [94.8 kg] 94.8 kg (11/25 0500) Last BM Date :  (PTA)  Weight change: Filed Weights   04/29/23 0532 04/30/23 0500 05/01/23 0500  Weight: 93 kg 95.9 kg 94.8 kg    Intake/Output:   Intake/Output Summary (Last 24 hours) at 05/01/2023 0805 Last data filed at 05/01/2023 0700 Gross per 24 hour  Intake 3001.36 ml  Output 5660 ml  Net -2658.64 ml      Physical Exam    General:  intubated/sedated HEENT: normal + ETT/OG Neck: supple. RIJ swan Cor: Chest open. With dressing in place  (Seems to be more blood under dressing today) + CTs + ECMO cannulas Lungs: clear Abdomen: soft, nontender, nondistended. No hepatosplenomegaly. No bruits or masses. Good bowel sounds. Extremities: no cyanosis, clubbing, rash, mild edema diffusely Neuro:intubated/sedated; hiccus pimproving.   Telemetry   V paced 80 Asystole underneath Personally reviewed  Labs    CBC Recent Labs    04/30/23 1618 04/30/23 1623 05/01/23 0405 05/01/23 0412  WBC 8.2  --  8.9  --   HGB 9.0*   < > 9.3* 12.6*  HCT 27.7*   < > 28.8* 37.0*  MCV 89.6  --  90.0  --   PLT 54*  --  50*  --    < > = values in this interval not displayed.   Basic Metabolic Panel Recent Labs    16/10/96 0328 04/30/23 0343  04/30/23 1618 04/30/23 1623 05/01/23 0405 05/01/23 0412  NA 146*   < > 145   < > 143 147*  K 3.3*   < > 3.9   < > 3.6 3.6  CL 107   < > 106  --  104  --   CO2 29  --  31  --  31  --   GLUCOSE 150*   < > 143*  --  141*  --   BUN 31*   < > 33*  --  38*  --   CREATININE 1.26*   < > 1.28*  --  1.19  --   CALCIUM 8.8*  --  8.9  --  9.0  --   MG 2.0  --   --   --  2.2  --   PHOS 1.1*  --   --   --  2.5  --    < > = values in this interval not displayed.   Liver Function Tests Recent Labs    04/30/23 0328 05/01/23 0405  AST 108* 90*  ALT 44 45*  ALKPHOS 52 67  BILITOT 5.3* 5.1*  PROT 5.4* 5.9*  ALBUMIN 3.3* 3.1*   Other results:   Imaging    No results found.   Medications:  Scheduled Medications:  Chlorhexidine Gluconate Cloth  6 each Topical Q0600   docusate  100 mg Per Tube BID   feeding supplement (PIVOT 1.5 CAL)  1,000 mL Per Tube Q24H   feeding supplement (PROSource TF20)  60 mL Per Tube Q4H   free water  200 mL Per Tube Q2H   gabapentin  200 mg Oral Q8H   insulin aspart  0-24 Units Subcutaneous Q4H   mouth rinse  15 mL Mouth Rinse Q2H   pantoprazole (PROTONIX) IV  40 mg Intravenous QHS   polyethylene glycol  17 g Per Tube Daily   rocuronium  100 mg Intravenous Once   sodium chloride flush  10-40 mL Intracatheter Q12H    Infusions:  albumin human 12.5 g (05/01/23 0207)   DOPamine 2 mcg/kg/min (05/01/23 0700)   furosemide (LASIX) 200 mg in dextrose 5 % 100 mL (2 mg/mL) infusion 4 mg/hr (05/01/23 0700)   HYDROmorphone 2.5 mg/hr (05/01/23 0700)   meropenem (MERREM) IV Stopped (05/01/23 0049)   midazolam 9 mg/hr (05/01/23 0700)   norepinephrine (LEVOPHED) Adult infusion 2 mcg/min (05/01/23 0700)   potassium chloride 10 mEq (05/01/23 0749)   vancomycin Stopped (05/01/23 0526)    PRN Medications: albumin human, HYDROmorphone, HYDROmorphone, midazolam, morphine injection, ondansetron (ZOFRAN) IV, mouth rinse, oxyCODONE, rocuronium, sodium chloride  flush    Patient Profile   Johnathan Anderson is a 57 y.o. male with a past medical history of bicuspid aortic valve and moderate aortic root dilation who underwent graft and valve replacement. Complicated by VT arrest and biventricular shock (RV predominant) requiring central VA ecmo cannulation.   Assessment/Plan    1. Post-cardiotomy Cardiogenic shock -VT arrest and shock post-op 11/20 following Bental procedure and CABG X 2 (SVG from aortic graft to RCA and SVG to LAD) - post-op TEE with severe LV dysfunction and RV standstill, likely due to ischemia and prolonged pump time.  - Back to OR 11/20 for bleeding.VT arrest. Cannulated on central ECMO for ongoing RV failure - Remains on central VA ECMO + NE 4. DA 2 Epi off due to acidosis (mitochondrial uncoupling). Titrate to keep MAPs > 70  - Circuit ok (see ECMO note) - Mild pulsativity on Aline and PA cath.  - Bedside echo 11/22 LVEF < 20% RV severe HK. Small effusion - Remains critically ill. - This AM, CVP 8-10, minimal pulsatility on PA waveform (PA diastolic < 12). Remains pacer dependent with no native A-V conduction.  - Labs stable, fibrinogen 658, LFTs improving, LDH stable, sCr 1.19 with 6L urine output yesterday.  - Hgb stable at 9.3 with persistent thrombocytopenia, plt ct 50k.  - Plan for OR today; will adjust plan according to intra-op findings & TEE. Likely plan for transfer to [facility] for transplant evaluation.   2. Bicuspid aortic valve/ascending aortic aneurysm -S/p Bental 11/20 as above  3. Post-op ABLA & thrombocytopenia -Has received multiple blood products - stable today  4. Hypernatremia - continue FW - stable  5. Acute post-op hypoxic respiratory failure - vent per CCM  - Keep sedated - Head CT 11/21 ok  - stable; discussed with CCM. CXR clear. Continuing to have some secretions but appears improved.   6. Asystole - continue pacing  7. Hypokalemia - supp CRITICAL CARE Performed by: Dorthula Nettles   Total critical care time: 45 minutes  Critical care time was exclusive of separately billable procedures and treating other patients.  Critical care was necessary to treat or prevent imminent or life-threatening deterioration.  Critical  care was time spent personally by me on the following activities: development of treatment plan with patient and/or surrogate as well as nursing, discussions with consultants, evaluation of patient's response to treatment, examination of patient, obtaining history from patient or surrogate, ordering and performing treatments and interventions, ordering and review of laboratory studies, ordering and review of radiographic studies, pulse oximetry and re-evaluation of patient's condition.   Length of Stay: 5  Johnathan Anglemyer, DO  05/01/2023, 8:05 AM  Advanced Heart Failure Team Pager [phone] (M-F; 7a - 5p)  Please contact [facility] for night-coverage after hours (5p -7a ) and weekends on [email or web address]

## 2023-05-01 NOTE — Progress Notes (Signed)
Patient taken to operating room for wash out and exploration. His SVG grafts appeared to be intact. During this time we weaned ECMO flows to 2.5LPM while on low dose dopamine and levophed. TEE demonstrated severely reduced LV/RV function with minimal pulsatility. After MDT discussion the decision was made to transfer Mr. Mccandlish to [facility] for urgent transplant evaluation. I discussed this at length with his wife at bedside and daughter over the phone. They are agreeable to this plan. Also discussed with CCM and CT surgery.   CC TIME: 1 hour.   Denver Bentson 4:34 PM

## 2023-05-01 NOTE — Discharge Summary (Addendum)
Advanced Heart Failure Team  Discharge Summary   Patient ID: Johnathan Anderson MRN: [id number], DOB/AGE: 1965-07-09 57 y.o. Admit date: 04/26/2023 D/C date:     05/01/2023   Primary Discharge Diagnoses:  1.Post Cardiotomy Cardiogenic Shock   S/P Bental Procedure and CABG x2   Acute Biventricular HF 2. VT Arrest  3. Bicuspid Aortic Valve/Ascending Aortic Aneurysm 4. Anemia, Acute Blood Loss Post Op  5. Thrombocytopenia  6. Acute Hypoxic Respiratory Failure 7. Hypernatremia    Hospital Course:   Johnathan Anderson is a 57 y.o. male with a past medical history of GERD, HTN, HLD, bicuspid aortic valve and moderate aortic root dilation who underwent elective AVR with bental procedure and CABG x2  (SVG from aortic graft to RCA and SVG to LAD on 04/26/23. Due to biventricular failure on intra-op TEE patient also underwent SVG to the RCA & LAD. Immediate post-operative course complicated by VT arrest requiring defibrillation & brief CPR requiring urgent resternotomy in the ICU. No tamponade but had severe RV failure./RV standstill.  Taken to the OR for exploration. Decision was made to deploy VA ECMO with central cannulation. Returned to OR on 04/27/23 re-exploration mediastinum with removal of clot and washout. RV with minimal evidence of improvement, LV < 20%. CT head no acute abnormalities.  Returned to the OR on 05/01/23 for wash out. Intraoperative TEE with persistent severe biventricular heart failure. Currently on Dopamine  3 mcg (transitioned from epi due to lactic acidosis) + lasix drip 4 mg per hour +Norepinephrine 4 mcg. Renal function has remained stable with adequate urine output. He remained sedated with open chest. + Gag, pupils equal reactive.   CT Surgery/CCM/Advanced Heart Failure discussed with [facility] Team. Plan to transfer for transplant consideration. Accepting MD: Dr Nance Pew.   Transfer via [facility]   V-A ECMO via central cannulation (36/46 RA drain, 20Fr aortic  return) Flow 4.09 RPM 3600 Sweep Gas 4 Fio2 80%.   Discharge Vitals: Blood pressure 100/88, pulse 80, temperature (!) 96.6 F (35.9 C), resp. rate 18, height 5\' 6"  (1.676 m), weight 94.8 kg, SpO2 100%.  Labs: Lab Results  Component Value Date   WBC 8.9 05/01/2023   HGB 8.5 (L) 05/01/2023   HCT 25.0 (L) 05/01/2023   MCV 88.5 05/01/2023   PLT 49 (L) 05/01/2023    Recent Labs  Lab 05/01/23 0405 05/01/23 0412 05/01/23 1545 05/01/23 1600  NA 143   < > 147* 147*  K 3.6   < > 3.8 3.6  CL 104  --  107  --   CO2 31  --  29  --   BUN 38*  --  38*  --   CREATININE 1.19  --  1.23  --   CALCIUM 9.0  --  9.1  --   PROT 5.9*  --   --   --   BILITOT 5.1*  --   --   --   ALKPHOS 67  --   --   --   ALT 45*  --   --   --   AST 90*  --   --   --   GLUCOSE 141*  --  141*  --    < > = values in this interval not displayed.   Lab Results  Component Value Date   CHOL 133 12/20/2021   HDL 40 12/20/2021   LDLCALC 72 12/20/2021   TRIG 116 12/20/2021   BNP (last 3 results) No results for input(s): "BNP" in the  last 8760 hours.  ProBNP (last 3 results) No results for input(s): "PROBNP" in the last 8760 hours.   Diagnostic Studies/Procedures   DG Chest Port 1 View  Result Date: 05/01/2023 CLINICAL DATA:  Postop for cardiac surgery, feeding tube placement EXAM: PORTABLE CHEST 1 VIEW COMPARISON:  05/01/2023 6:12 a.m. FINDINGS: It is difficult to definitively identify the tip of the endotracheal tube due to the confluence of central chest tubing, but I believe that terminates just above the carina. The feeding tube extends down into the stomach antrum near the pylorus. Aortic valve prosthesis noted. Chest tube or mediastinal drain projects over the cardiac shadow. ECMO tubing appears unchanged in configuration. Theone Murdoch catheter tip is believed to be in the right pulmonary artery. Previously suggested pneumomediastinum is less conspicuous. Likewise the tiny right pneumothorax has reduced  conspicuity and may have resolved. IMPRESSION: 1. The tip of the endotracheal tube is difficult to definitively identify due to the confluence of central chest tubing, but I believe that terminates just above the carina. 2. The feeding tube extends down into the stomach antrum near the pylorus. 3. Remaining support apparatus appears stable. 4. Previously suggested pneumomediastinum and tiny right pneumothorax are less conspicuous and may have resolved. Electronically Signed   By: Gaylyn Rong M.D.   On: 05/01/2023 16:34   DG CHEST PORT 1 VIEW  Result Date: 05/01/2023 CLINICAL DATA:  Follow-up tiny right pneumothorax.  ECMO. EXAM: PORTABLE CHEST 1 VIEW COMPARISON:  04/30/2023 FINDINGS: Endotracheal tube in satisfactory position. Both ECMO tube tips remain in the right atrium. Right jugular Swan-Ganz catheter tip in the main pulmonary artery. Feeding tube extending into the distal stomach with its tip not included. Mediastinal tubes are unchanged with a stable pneumomediastinum. Less than 5% right apical pneumothorax without the previously demonstrated tiny lateral component. Stable poor inspiration and mild bibasilar atelectasis. Grossly normal sized heart. IMPRESSION: 1. Less than 5% right apical pneumothorax without the previously demonstrated tiny lateral component. 2. Stable pneumomediastinum. 3. Stable poor inspiration and mild bibasilar atelectasis. Electronically Signed   By: Beckie Salts M.D.   On: 05/01/2023 10:31   DG CHEST PORT 1 VIEW  Result Date: 04/30/2023 CLINICAL DATA:  Patient on ECMO. EXAM: PORTABLE CHEST 1 VIEW COMPARISON:  04/29/2023 FINDINGS: Stable cardiomediastinal contours. Status post aortic valve replacement. There is a right IJ PA catheter with tip terminating over the right main pulmonary artery. ECMO cannulation support apparatus. The endotracheal tube tip is suboptimally visualized due to multiple overlapping tubes within the projection of the mediastinum. Enteric tube  courses below the level of the GE junction. Mediastinal drain unchanged. Small pleural effusions are stable from previous exam. Tiny pneumothorax is noted overlying the periphery of the right mid and no significant left pneumothorax. Platelike atelectasis noted within the periphery of the left base. Lower lung measuring 3 mm in thickness. IMPRESSION: 1. Cannot confirm positioning of ET tube due to overlap of multiple tubes and lines within the projection of the mediastinum. The remaining support apparatus appears stable from previous exam. 2. Tiny pneumothorax overlying the periphery of the right mid and lower lung, approximately 3 mm in thickness. 3. Stable small pleural effusions. 4. Platelike atelectasis within the periphery of the left base. These results will be called to the ordering clinician or representative by the Radiologist Assistant, and communication documented in the PACS or [employer]. The Electronically Signed   By: Signa Kell M.D.   On: 04/30/2023 09:58    Discharge Medications   Allergies  as of 05/01/2023       Reactions   Levaquin [levofloxacin]    unknown        Medication List     STOP taking these medications    amLODipine 5 MG tablet Commonly known as: NORVASC   omeprazole 20 MG capsule Commonly known as: PRILOSEC   ondansetron 4 MG disintegrating tablet Commonly known as: ZOFRAN-ODT   rizatriptan 10 MG disintegrating tablet Commonly known as: MAXALT-MLT   rosuvastatin 10 MG tablet Commonly known as: CRESTOR   topiramate 50 MG tablet Commonly known as: TOPAMAX       TAKE these medications    Chlorhexidine Gluconate Cloth 2 % Pads Apply 6 each topically daily at 6 (six) AM. Start taking on: May 02, 2023   chlorproMAZINE 25 mg in sodium chloride 0.9 % 25 mL Inject 25 mg into the vein every 8 (eight) hours as needed.   docusate 50 MG/5ML liquid Commonly known as: COLACE Place 10 mLs (100 mg total) into feeding tube 2 (two) times  daily.   DOPamine 3.2-5 MG/ML-% infusion Commonly known as: INTROPIN Inject 301.5 mcg/min into the vein continuous.   feeding supplement (PIVOT 1.5 CAL) Liqd Place 1,000 mLs into feeding tube continuous.   feeding supplement (PROSource TF20) liquid Place 60 mLs into feeding tube 2 (two) times daily.   free water Soln Place 200 mLs into feeding tube every 2 (two) hours.   furosemide 200 mg in dextrose 5 % 80 mL Inject 4 mg/hr into the vein continuous.   gabapentin 250 MG/5ML solution Commonly known as: NEURONTIN Take 4 mLs (200 mg total) by mouth every 8 (eight) hours.   HYDROmorphone 2 mg/mL Soln Commonly known as: DILAUDID Inject 0.25-2 mg into the vein every 30 (thirty) minutes as needed (to achieve RASS & CPOT goal, breakthrough pain.).   HYDROmorphone 2 mg/mL Soln Commonly known as: DILAUDID Inject 1 mg into the vein every 30 (thirty) minutes as needed (to achieve RASS & CPOT goal, breakthrough pain.).   HYDROmorphone 50 mg in sodium chloride 0.9 % 45 mL Inject 0.5-4 mg/hr into the vein continuous.   insulin aspart 100 UNIT/ML injection Commonly known as: novoLOG Inject 0-24 Units into the skin every 4 (four) hours.   meropenem 1 g in sodium chloride 0.9 % 100 mL Inject 1 g into the vein every 8 (eight) hours.   midazolam 1 mg/mL Soln Commonly known as: VERSED Inject 1-2 mg into the vein every hour as needed.   midazolam-sodium chloride 100-0.9 MG/100ML-% Soln Inject 4-10 mg/hr into the vein continuous.   morphine (PF) 2 MG/ML injection Inject 0.5-2 mLs (1-4 mg total) into the vein every hour as needed.   norepinephrine 16-5 MG/250ML-% Soln Commonly known as: LEVOPHED Inject 0-40 mcg/min into the vein continuous.   ondansetron 4 MG/2ML Soln injection Commonly known as: ZOFRAN Inject 2 mLs (4 mg total) into the vein every 6 (six) hours as needed for nausea or vomiting.   oxyCODONE 5 MG immediate release tablet Commonly known as: Oxy IR/ROXICODONE Take  1-2 tablets (5-10 mg total) by mouth every 3 (three) hours as needed for severe pain (pain score 7-10).   pantoprazole 40 MG injection Commonly known as: PROTONIX Inject 40 mg into the vein at bedtime.   polyethylene glycol 17 g packet Commonly known as: MIRALAX / GLYCOLAX Place 17 g into feeding tube daily. Start taking on: May 02, 2023   vancomycin 1250 MG/250ML Soln Commonly known as: VANCOREADY Inject 250 mLs (1,250 mg total)  into the vein every 12 (twelve) hours. Start taking on: May 02, 2023        Disposition   The patient will be discharged in stable condition to [facility]       Duration of Discharge Encounter: Greater than 35 minutes   Signed, Amy Clegg NP-C  05/01/2023, 5:03 PM   [facility] Advanced Heart Failure 6:52 PM

## 2023-05-01 NOTE — TOC Progression Note (Signed)
Transition of Care [facility]) - Progression Note    Patient Details  Name: Johnathan Anderson MRN: [id number] Date of Birth: 05-Jul-1965  Transition of Care [facility]) CM/SW Contact  Elliot Cousin, RN Phone Number: [phone] 05/01/2023, 5:08 PM  Clinical Narrative:    Scheduled transfer to [facility].    Expected Discharge Plan: IP Rehab Facility Barriers to Discharge: No Barriers Identified  Expected Discharge Plan and Services   Discharge Planning Services: CM Consult   Living arrangements for the past 2 months: Single Family Home Expected Discharge Date: 05/01/23                                     Social Determinants of Health (SDOH) Interventions SDOH Screenings   Food Insecurity: No Food Insecurity (04/27/2023)  Housing: Low Risk  (04/27/2023)  Transportation Needs: No Transportation Needs (04/27/2023)  Utilities: Not At Risk (04/27/2023)  Depression (PHQ2-9): Low Risk  (07/06/2022)  Financial Resource Strain: Medium Risk (08/12/2022)  Tobacco Use: Medium Risk (04/26/2023)    Readmission Risk Interventions     No data to display

## 2023-05-01 NOTE — Progress Notes (Signed)
[address]       [place] [address]             [phone]      4 Days Post-Op Procedure(s) (LRB): EXPLORATION POST OPERATIVE OPEN HEART (N/A) TRANSESOPHAGEAL ECHOCARDIOGRAM (TEE) (N/A) Subjective: Patient is sedated and intubated  Objective: Vital signs in last 24 hours: Temp:  [98.1 F (36.7 C)-98.4 F (36.9 C)] 98.4 F (36.9 C) (11/25 0700) Pulse Rate:  [67-125] 80 (11/25 0752) Cardiac Rhythm: A-V Sequential paced (11/24 2000) Resp:  [9-24] 18 (11/25 0700) BP: (91-93)/(82-84) 93/84 (11/24 1545) SpO2:  [85 %-100 %] 98 % (11/25 0752) Arterial Line BP: (82-108)/(73-98) 100/94 (11/25 0700) FiO2 (%):  [50 %] 50 % (11/25 0752) Weight:  [94.8 kg] 94.8 kg (11/25 0500)  Hemodynamic parameters for last 24 hours: PAP: (11-23)/(-6-16) 20/16 CVP:  [8 mmHg-16 mmHg] 11 mmHg  Intake/Output from previous day: 11/24 0701 - 11/25 0700 In: 3136.5 [I.V.:551.1; NG/GT:1337.3; IV Piggyback:1248.1] Out: 6040 [Urine:5840; Chest Tube:150] Intake/Output this shift: No intake/output data recorded.  General appearance: intubated Neurologic: sedated Heart: Paced Lungs: slightly diminished breath sounds bibasilar Abdomen: hypoactive BS, soft, no distension Extremities: SCDs in place Wound: Open chest, clean  Lab Results: Recent Labs    04/30/23 1618 04/30/23 1623 05/01/23 0405 05/01/23 0412  WBC 8.2  --  8.9  --   HGB 9.0*   < > 9.3* 12.6*  HCT 27.7*   < > 28.8* 37.0*  PLT 54*  --  50*  --    < > = values in this interval not displayed.   BMET:  Recent Labs    04/30/23 1618 04/30/23 1623 05/01/23 0405 05/01/23 0412  NA 145   < > 143 147*  K 3.9   < > 3.6 3.6  CL 106  --  104  --   CO2 31  --  31  --   GLUCOSE 143*  --  141*  --   BUN 33*  --  38*  --   CREATININE 1.28*  --  1.19  --   CALCIUM 8.9  --  9.0  --    < > = values in this interval not displayed.    PT/INR:  Recent Labs    05/01/23 0405  LABPROT 15.1  INR 1.2   ABG     Component Value Date/Time   PHART 7.439 05/01/2023 0412   HCO3 34.3 (H) 05/01/2023 0412   TCO2 36 (H) 05/01/2023 0412   ACIDBASEDEF 2.0 04/29/2023 2237   O2SAT 64 05/01/2023 0423   CBG (last 3)  Recent Labs    04/30/23 2308 05/01/23 0404 05/01/23 0754  GLUCAP 146* 143* 120*    Assessment/Plan: S/P Procedure(s) (LRB): EXPLORATION POST OPERATIVE OPEN HEART (N/A) TRANSESOPHAGEAL ECHOCARDIOGRAM (TEE) (N/A)  Neuro: Sedated on vent, receiving rocuronium PRN for hiccups. Hiccups improved.   CV: On central VA ECMO, goal flow 4-4.5. Paced at 80, no underlying electrical activity. On Dopamine and Norepi for MAP>70.   Pulm: Thick tracheal secretions yesterday, underwent bronchoscopy with bronchial alveolar lavage, culture pending. On vent 18/PEEP 8, FiO2 50% per CCM. CT output 70cc last shift. CXR with bilateral small pleural effusions and bibasilar atelectasis.   GI: Trickle feeds started and tolerated, on hold for washout today.   Expected postop ABLA: Improved H/H 12.6/37  Expected postop thrombocytopenia: Stable-trending down, plt 50,000. Aspirin has been held.   Renal: Stable Cr 1.19. UO 5840cc/24 hrs. +9lbs from preop. On  Lasix gtt. K3.6, supplement.   ID: No leukocytosis, Tmax 98.4. On Meropenem.   Dispo: To OR for mediastinal washout today to examine biventricular function and status of vein grafts. If not improved then may need to transfer to [facility] for possible transplant.    LOS: 5 days    Jenny Reichmann, PA-C 05/01/2023

## 2023-05-01 NOTE — Plan of Care (Signed)
  Problem: Education: Goal: Knowledge of General Education information will improve Description: Including pain rating scale, medication(s)/side effects and non-pharmacologic comfort measures Outcome: Progressing   Problem: Health Behavior/Discharge Planning: Goal: Ability to manage health-related needs will improve Outcome: Progressing   Problem: Clinical Measurements: Goal: Ability to maintain clinical measurements within normal limits will improve Outcome: Progressing Goal: Will remain free from infection Outcome: Progressing Goal: Diagnostic test results will improve Outcome: Progressing Goal: Respiratory complications will improve Outcome: Progressing Goal: Cardiovascular complication will be avoided Outcome: Progressing   Problem: Activity: Goal: Risk for activity intolerance will decrease Outcome: Progressing   Problem: Nutrition: Goal: Adequate nutrition will be maintained Outcome: Progressing   Problem: Coping: Goal: Level of anxiety will decrease Outcome: Progressing   Problem: Elimination: Goal: Will not experience complications related to bowel motility Outcome: Progressing Goal: Will not experience complications related to urinary retention Outcome: Progressing   Problem: Pain Management: Goal: General experience of comfort will improve Outcome: Progressing   Problem: Safety: Goal: Ability to remain free from injury will improve Outcome: Progressing   Problem: Skin Integrity: Goal: Risk for impaired skin integrity will decrease Outcome: Progressing   Problem: Education: Goal: Will demonstrate proper wound care and an understanding of methods to prevent future damage Outcome: Progressing Goal: Knowledge of disease or condition will improve Outcome: Progressing Goal: Knowledge of the prescribed therapeutic regimen will improve Outcome: Progressing Goal: Individualized Educational Video(s) Outcome: Progressing   Problem: Activity: Goal: Risk for  activity intolerance will decrease Outcome: Progressing   Problem: Cardiac: Goal: Will achieve and/or maintain hemodynamic stability Outcome: Progressing   Problem: Clinical Measurements: Goal: Postoperative complications will be avoided or minimized Outcome: Progressing   Problem: Respiratory: Goal: Respiratory status will improve Outcome: Progressing   Problem: Skin Integrity: Goal: Wound healing without signs and symptoms of infection Outcome: Progressing Goal: Risk for impaired skin integrity will decrease Outcome: Progressing   Problem: Urinary Elimination: Goal: Ability to achieve and maintain adequate renal perfusion and functioning will improve Outcome: Progressing   Problem: Activity: Goal: Ability to tolerate increased activity will improve Outcome: Progressing   Problem: Respiratory: Goal: Ability to maintain a clear airway and adequate ventilation will improve Outcome: Progressing   Problem: Role Relationship: Goal: Method of communication will improve Outcome: Progressing

## 2023-05-01 NOTE — Progress Notes (Signed)
Nutrition Follow-up  DOCUMENTATION CODES:   Not applicable  INTERVENTION:   Reverse trendelenburg of >10 degrees while TF infusing until able to maintain HOB >30 degrees   Discussed nutrition poc with Dr. Katrinka Blazing PCCM; ok to begin titration of TF post OR Today  Tube Feeding via Cortrak:  Pivot 1.5 at 60 ml/hr with Pro-Source TF20 60 mL BID Increase TF to rate of 30 ml/hr, titrate by 10 mL q 8 hours until goal rate of 60 ml/hr TF at goal provides 175 g of protein, 2320 kcals and 1094 mL of free water  Current free water flush of 200 mL q 2 hours plus TF at goal provides: almost 3.5 L over 24 hours  NUTRITION DIAGNOSIS:   Increased nutrient needs related to catabolic illness, acute illness as evidenced by estimated needs.  Being addressed via TF  GOAL:   Patient will meet greater than or equal to 90% of their needs  Progressing  MONITOR:   Vent status, Labs, Weight trends, Skin, TF tolerance  REASON FOR ASSESSMENT:   Consult Enteral/tube feeding initiation and management, Assessment of nutrition requirement/status (ECMO)  ASSESSMENT:   57 yo male admitted for elective AVR with bental procedure with difficulty coming off bypass with concern for cardiac ischemia and required CABG x 2. Post op complicated by significant bleeding, VT arrest, cardiogenic shock; ultimately required VA ECMO cannulation. PMH includes HLD, GERD, vertigo, bicuspid aortic stenosis and ascending aortic aneurysm.  11/20 AVR/Bentyl procedure, possible cardiac ischemia requiring CABG x 2, cardiogenic shock, VA ECMO cannulation, open chest 11//21 Return to OR for removal of clot, washout 11/24 Bronch: thick mucous in distal ETT but no purulent secretions in airways  Pt remains on vent support, VA ECMO day 5, chest open. Noted plan for OR today for washout to examine biventricular function and status of vein grafts +hiccups, receiving rocuronium prn Levophed at 5, dopamine at 3  Noted consult to [facility]  for possible transfer for transplant eval Remains asystolic underneath pacer  Remains on lasix gtt. UOP 5.84 L in 24 hours; net negative almost 3 L in 24 hours  Pivot 1.5 at 20 ml/hr with Pro-Source TF20 60 mL q 4 hours via Cortrak. Receiving 200 mL free water q 2 hours as well. TF currently on hold for OR  Labs: sodium 146 (H), potassium 4.2 (wdl), BUN 38 (H), Creaitnine 1.19 Meds: ss novolog, lasix gtt, colace, miralax  NUTRITION - FOCUSED PHYSICAL EXAM:  Flowsheet Row Most Recent Value  Orbital Region No depletion  Upper Arm Region Unable to assess  Thoracic and Lumbar Region No depletion  Buccal Region No depletion  Temple Region No depletion  Clavicle Bone Region No depletion  Clavicle and Acromion Bone Region No depletion  Scapular Bone Region No depletion  Dorsal Hand Unable to assess  Patellar Region Unable to assess  Anterior Thigh Region Unable to assess  Posterior Calf Region Unable to assess  Edema (RD Assessment) Mild       Diet Order:   Diet Order     None       EDUCATION NEEDS:   Not appropriate for education at this time  Skin:  Skin Assessment: Skin Integrity Issues: Skin Integrity Issues:: Other (Comment) Other: open chest  Last BM:  11/25  Height:   Ht Readings from Last 1 Encounters:  04/26/23 5\' 6"  (1.676 m)    Weight:   Wt Readings from Last 1 Encounters:  05/01/23 94.8 kg    BMI:  Body mass index  is 33.73 kg/m.  Estimated Nutritional Needs:   Kcal:  1950-2250 kcals  Protein:  130-160 g  Fluid:  1.8 L  Romelle Starcher MS, RDN, LDN, CNSC Registered Dietitian 3 Clinical Nutrition RD Pager and On-Call Pager Number Located in [place]

## 2023-05-01 NOTE — Anesthesia Preprocedure Evaluation (Signed)
Anesthesia Evaluation  Patient identified by MRN, date of birth, ID band Patient unresponsive    Reviewed: Allergy & Precautions, NPO status , Patient's Chart, lab work & pertinent test results, Unable to perform ROS - Chart review only  Airway Mallampati: Intubated       Dental   Pulmonary former smoker On ventilator      + intubated    Cardiovascular hypertension, + Peripheral Vascular Disease   Rhythm:Regular Rate:Normal  S/p bioprosthetic AVR with bental procedure complicated by post op cardiac arrest, brought back to OR for centrally cannulated VA ECMO.  Currently on epi, norepi gtts, dopamine   Neuro/Psych  Headaches    GI/Hepatic negative GI ROS, Neg liver ROS,,,  Endo/Other    Class 3 obesity  Renal/GU negative Renal ROS     Musculoskeletal  (+) Arthritis ,    Abdominal   Peds  Hematology negative hematology ROS (+)   Anesthesia Other Findings   Reproductive/Obstetrics                              Anesthesia Physical Anesthesia Plan  ASA: 4 and emergent  Anesthesia Plan: General   Post-op Pain Management:    Induction: Intravenous  PONV Risk Score and Plan: 2 and Treatment may vary due to age or medical condition  Airway Management Planned: Oral ETT  Additional Equipment:   Intra-op Plan:   Post-operative Plan: Post-operative intubation/ventilation  Informed Consent: I have reviewed the patients History and Physical, chart, labs and discussed the procedure including the risks, benefits and alternatives for the proposed anesthesia with the patient or authorized representative who has indicated his/her understanding and acceptance.     History available from chart only and Consent reviewed with POA  Plan Discussed with: CRNA  Anesthesia Plan Comments:          Anesthesia Quick Evaluation

## 2023-05-01 NOTE — Transfer of Care (Signed)
Immediate Anesthesia Transfer of Care Note  Patient: Johnathan Anderson  Procedure(s) Performed: MEDIAL STERNAL WOUND DEBRIDEMENT TRANSESOPHAGEAL ECHOCARDIOGRAM (TEE)  Patient Location: ICU  Anesthesia Type:General  Level of Consciousness: Patient remains intubated per anesthesia plan  Airway & Oxygen Therapy: Patient remains intubated per anesthesia plan and Patient placed on Ventilator (see vital sign flow sheet for setting)  Post-op Assessment: Report given to RN and Post -op Vital signs reviewed and stable  Post vital signs: Reviewed and stable  Last Vitals:  Vitals Value Taken Time  BP 99/87 1508  Temp    Pulse 80 1508  Resp 16 05/01/23 1507  SpO2 100 1508  Vitals shown include unfiled device data.  Last Pain:  Vitals:   05/01/23 0800  TempSrc: Core  PainSc:       Patients Stated Pain Goal: 0 (04/29/23 2000)  Complications: No notable events documented.

## 2023-05-01 NOTE — Brief Op Note (Signed)
05/01/2023  2:56 PM  PATIENT:  Johnathan Anderson  57 y.o. male  PRE-OPERATIVE DIAGNOSIS:  ECMO S/P BENTALL  POST-OPERATIVE DIAGNOSIS:  ECMO S/P BENTALL  PROCEDURE:  Mediastinal washout and repacking  SURGEON:  Surgeons and Role:    Lovett Sox, MD - Primary  PHYSICIAN ASSISTANT: na  ASSISTANTS: RNFA    ANESTHESIA:    general EBL:  20 ml  BLOOD ADMINISTERED: 1 unit  PLTS  DRAINS: anterior and posterior mediatinal  LOCAL MEDICATIONS USED:  NONE  SPECIMEN:  Scraping of pericardial clot  DISPOSITION OF SPECIMEN:   microbiology lab  COUNTS:  YES  TOURNIQUET:  * No tourniquets in log *  DICTATION: .Dragon Dictation  PLAN OF CARE:  return to ICU  PATIENT DISPOSITION:  ICU - intubated and critically ill.   Delay start of Pharmacological VTE agent (>24hrs) due to surgical blood loss or risk of bleeding: no

## 2023-05-01 NOTE — Progress Notes (Signed)
NAME:  Johnathan Anderson, MRN:  [id number], DOB:  05/04/1966, LOS: 5 ADMISSION DATE:  04/26/2023, CONSULTATION DATE:  04/26/23 REFERRING MD:  Dr. Leafy Ro, CHIEF COMPLAINT:  postop   History of Present Illness:  Pt encephalopathic, therefore HPI obtained from EMR.    30 yoM with hx of bicuspid AV w/ AS, aortic aneurysm, HTN, HLD GERD, and former smoker who was admitted for elective AVR.  Pt followed for progressive bicuspid aortic stenosis and moderate ascending aortic aneurysm.  Asymptomatic.  Recent echo showing normal LV function and mean gradient of 39 mmHg.  No evidence of CAD or PHTN on preop workup.  Pt elected to repair issue now prior to becoming symptomatic.   Underwent elective bioprosthetic AVR with bental procedure complicated by possible cardioplegia vs coronary insufficiency coming off bypass requiring CABG x 2, RSVG from aortic graft to RCA and LAD and remains paced.   EBL 1250 Pump time 1005- 1428 S/p FFP x2, plts, cell saver 693, UOP 1100, paralytics not reversed, given methadone   ACT prior to ICU, 145 after second protamine Arrived on epi  Pertinent  Medical History  Bicuspid AV w/ AS, aortic aneurysm, HTN, GERD, HLD, vertigo, former smoker. Quit smoking 20 years ago.   Significant Hospital Events: Including procedures, antibiotic start and stop dates in addition to other pertinent events   11/20 AVR/ bental procedure & CABG X 2, VT postop, back to OR, centrally cannulated for ecmo 11/21 washout in OR  Interim History / Subjective:  No events.  Still gets hiccups intermittently despite baclofen.  Objective   Blood pressure 93/84, pulse 80, temperature 98.4 F (36.9 C), resp. rate 18, height 5\' 6"  (1.676 m), weight 94.8 kg, SpO2 98%. PAP: (11-23)/(-6-16) 20/16 CVP:  [8 mmHg-16 mmHg] 11 mmHg  Vent Mode: PRVC FiO2 (%):  [50 %] 50 % Set Rate:  [18 bmp] 18 bmp Vt Set:  [500 mL] 500 mL PEEP:  [8 cmH20] 8 cmH20 Plateau Pressure:  [22 cmH20-24 cmH20] 23 cmH20    Intake/Output Summary (Last 24 hours) at 05/01/2023 0824 Last data filed at 05/01/2023 0800 Gross per 24 hour  Intake 3067.29 ml  Output 5660 ml  Net -2592.71 ml   Filed Weights   04/29/23 0532 04/30/23 0500 05/01/23 0500  Weight: 93 kg 95.9 kg 94.8 kg   Examination: Plat 23, DP 15 Pupils small equal Heavily sedated Open chest with spacer Lungs sounds okay Ext lukewarm, not great pulses but no ischemic changes Abd soft Circuit looks okay  Coox 64 CBC stable BMP stable Stable hyper-bili and mild ALI LDH stable CBG okay  Patient Lines/Drains/Airways Status     Active Line/Drains/Airways     Name Placement date Placement time Site Days   Arterial Line 04/26/23 Left Radial 04/26/23  0750  Radial  5   Arterial Line 04/26/23 Right Radial 04/26/23  2245  Radial  5   Peripheral IV 04/26/23 18 G Left Hand 04/26/23  0735  Hand  5   Peripheral IV 04/26/23 18 G Posterior;Right Hand 04/26/23  2100  Hand  5   CVC Double Lumen 04/26/23 Right Internal jugular 16 cm 04/26/23  1044  -- 5   ECMO Drainage Single Lumen Cannula 04/26/23  2000  -- 5   ECMO Return Single Lumen Cannula 04/26/23  2000  -- 5   Urethral Catheter Piyanuch Pesare, RN Latex;Temperature probe 16 Fr. 04/26/23  0855  Latex;Temperature probe  5   Y Chest Tube 1 and 2 1 Left Mediastinal 28  Fr. 2 Anterior;Medial Mediastinal 32 Fr. 04/26/23  1439  -- 5   Airway 8 mm 04/26/23  0857  -- 5   Pulmonary Artery Catheter 04/26/23 Right 52 04/26/23  2100  -- 5   Incision (Closed) 04/26/23 Chest 04/26/23  1444  -- 5   Incision (Closed) 04/26/23 Leg Left 04/26/23  1444  -- 5   Incision (Closed) 04/26/23 Leg Right 04/26/23  1444  -- 5   Small Bore Feeding Tube 10 Fr. Right nare Marking at nare/corner of mouth 83 cm 04/28/23  1055  Right nare  3             albumin human 12.5 g (05/01/23 0207)   DOPamine 2 mcg/kg/min (05/01/23 0800)   furosemide (LASIX) 200 mg in dextrose 5 % 100 mL (2 mg/mL) infusion 4 mg/hr (05/01/23  0800)   HYDROmorphone 2.5 mg/hr (05/01/23 0800)   meropenem (MERREM) IV Stopped (05/01/23 0049)   midazolam 9 mg/hr (05/01/23 0800)   norepinephrine (LEVOPHED) Adult infusion 2 mcg/min (05/01/23 0800)   potassium chloride 50 mL/hr at 05/01/23 0800   vancomycin Stopped (05/01/23 0526)     Resolved Hospital Problem list    Assessment & Plan:   Bicuspid AV with severe AS and ascending aortic aneurysm s/p AVR/ Bental procedure complicated by post-op coagulopathy and bleeding, VT arrest, acute RV failure and cardiogenic shock requiring VA ECMO cannulation.  S/p CABG x 2 (RSVG from aortic graft to RCA and LAD via right and left greater saphenous vein harvest)  Acute biventricular failure VT cardiac arrest- brief no neuro sequelae expected Severe post-op ABLA, consumptive coagulopathy, & thrombocytopenia- settled out Hx HTN, HLD Hx skin cancer- in remission  - VA ECMO, lasix, inotrope adjustments per TCTS/AHF - Sweep to maintain pH 7.35-7.4, oxygenator for PaO2 100-200 - Levo for MAP 65 - aspirin, statin - open chest precautions; has chugging with any movement or coughing. Roc PRN. - decision on starting AC TBD - Empiric abx - Lung protective ventilation, f/u BAL - Plan for washout today and decision over next 48h on whether to keep vs send for transplant depending on progress  Best Practice (right click and "Reselect all SmartList Selections" daily)   Diet/type: tubefeeds DVT prophylaxis: SCD GI prophylaxis: PPI Lines: Central line and Arterial Line Foley:  Yes, and it is still needed Code Status:  full code Last date of multidisciplinary goals of care discussion [per primary team]  33 min cc time Myrla Halsted MD PCCM

## 2023-05-02 ENCOUNTER — Encounter (HOSPITAL_COMMUNITY): Payer: Self-pay | Admitting: Cardiothoracic Surgery

## 2023-05-02 LAB — TYPE AND SCREEN
ABO/RH(D): A POS
Antibody Screen: NEGATIVE
Unit division: 0
Unit division: 0
Unit division: 0
Unit division: 0

## 2023-05-02 LAB — BPAM RBC
Blood Product Expiration Date: 202412142359
Blood Product Expiration Date: 202412142359
Blood Product Expiration Date: 202412142359
ISSUE DATE / TIME: 202411251254
ISSUE DATE / TIME: 202411251254
ISSUE DATE / TIME: 202411252307
ISSUE DATE / TIME: 202411252307
ISSUE DATE / TIME: 202412142359
Unit Type and Rh: 202412142359
Unit Type and Rh: 202412142359
Unit Type and Rh: 6200
Unit Type and Rh: 6200
Unit Type and Rh: 6200
Unit Type and Rh: 6200

## 2023-05-02 LAB — PREPARE PLATELET PHERESIS: Unit division: 0

## 2023-05-02 LAB — CULTURE, RESPIRATORY W GRAM STAIN: Culture: NO GROWTH

## 2023-05-02 LAB — BPAM PLATELET PHERESIS
Blood Product Expiration Date: 202411262359
ISSUE DATE / TIME: 202411251436
Unit Type and Rh: 5100

## 2023-05-02 MED FILL — Heparin Sodium (Porcine) Inj 1000 Unit/ML: Qty: 1000 | Status: AC

## 2023-05-02 MED FILL — Lidocaine HCl Local Preservative Free (PF) Inj 2%: INTRAMUSCULAR | Qty: 14 | Status: CN

## 2023-05-02 MED FILL — Lidocaine HCl Local Preservative Free (PF) Inj 2%: INTRAMUSCULAR | Qty: 14 | Status: AC

## 2023-05-02 MED FILL — Potassium Chloride Inj 2 mEq/ML: INTRAVENOUS | Qty: 40 | Status: AC

## 2023-05-02 MED FILL — Potassium Chloride Inj 2 mEq/ML: INTRAVENOUS | Qty: 40 | Status: CN

## 2023-05-02 MED FILL — Heparin Sodium (Porcine) Inj 1000 Unit/ML: Qty: 1000 | Status: CN

## 2023-05-02 NOTE — Progress Notes (Signed)
[facility] team arrive to the unit around 2230. This RN assisted with transferring the patient to the [facility] mobile ICU vehicle. Transfer was successful and not complicated by anything. Patient left the unit at 0012.

## 2023-05-03 MED FILL — Calcium Chloride Inj 10%: INTRAVENOUS | Qty: 10 | Status: AC

## 2023-05-03 MED FILL — Electrolyte-R (PH 7.4) Solution: INTRAVENOUS | Qty: 6000 | Status: AC

## 2023-05-03 MED FILL — Sodium Bicarbonate IV Soln 8.4%: INTRAVENOUS | Qty: 100 | Status: AC

## 2023-05-03 MED FILL — Electrolyte-R (PH 7.4) Solution: INTRAVENOUS | Qty: 4000 | Status: AC

## 2023-05-03 MED FILL — Sodium Chloride IV Soln 0.9%: INTRAVENOUS | Qty: 5000 | Status: AC

## 2023-05-03 MED FILL — Heparin Sodium (Porcine) Inj 1000 Unit/ML: INTRAMUSCULAR | Qty: 30 | Status: AC

## 2023-05-03 MED FILL — Mannitol IV Soln 20%: INTRAVENOUS | Qty: 500 | Status: AC

## 2023-05-03 MED FILL — Sodium Bicarbonate IV Soln 8.4%: INTRAVENOUS | Qty: 300 | Status: AC

## 2023-05-03 MED FILL — Heparin Sodium (Porcine) Inj 1000 Unit/ML: INTRAMUSCULAR | Qty: 10 | Status: AC

## 2023-05-03 MED FILL — Albumin, Human Inj 5%: INTRAVENOUS | Qty: 500 | Status: AC

## 2023-05-03 MED FILL — Heparin Sodium (Porcine) Inj 1000 Unit/ML: INTRAMUSCULAR | Qty: 20 | Status: AC

## 2023-05-03 MED FILL — Sodium Chloride IV Soln 0.9%: INTRAVENOUS | Qty: 3000 | Status: AC

## 2023-05-03 MED FILL — Potassium Chloride Inj 2 mEq/ML: INTRAVENOUS | Qty: 40 | Status: AC

## 2023-05-06 LAB — AEROBIC/ANAEROBIC CULTURE W GRAM STAIN (SURGICAL/DEEP WOUND): Culture: NO GROWTH

## 2023-05-09 MED FILL — Sodium Chloride IV Soln 0.9%: INTRAVENOUS | Qty: 2000 | Status: AC

## 2023-05-09 MED FILL — Heparin Sodium (Porcine) Inj 1000 Unit/ML: INTRAMUSCULAR | Qty: 30 | Status: AC

## 2023-06-06 ENCOUNTER — Other Ambulatory Visit: Payer: Self-pay | Admitting: *Deleted

## 2023-06-06 ENCOUNTER — Encounter: Payer: Self-pay | Admitting: Nurse Practitioner

## 2023-06-06 ENCOUNTER — Ambulatory Visit: Payer: Commercial Managed Care - HMO | Attending: Nurse Practitioner | Admitting: Nurse Practitioner

## 2023-06-06 VITALS — BP 104/74 | HR 94 | Ht 66.0 in | Wt 188.8 lb

## 2023-06-06 DIAGNOSIS — I7121 Aneurysm of the ascending aorta, without rupture: Secondary | ICD-10-CM

## 2023-06-06 DIAGNOSIS — I10 Essential (primary) hypertension: Secondary | ICD-10-CM

## 2023-06-06 DIAGNOSIS — I350 Nonrheumatic aortic (valve) stenosis: Secondary | ICD-10-CM

## 2023-06-06 DIAGNOSIS — I513 Intracardiac thrombosis, not elsewhere classified: Secondary | ICD-10-CM

## 2023-06-06 DIAGNOSIS — I459 Conduction disorder, unspecified: Secondary | ICD-10-CM

## 2023-06-06 DIAGNOSIS — E785 Hyperlipidemia, unspecified: Secondary | ICD-10-CM

## 2023-06-06 DIAGNOSIS — Q2381 Bicuspid aortic valve: Secondary | ICD-10-CM

## 2023-06-06 DIAGNOSIS — Z8674 Personal history of sudden cardiac arrest: Secondary | ICD-10-CM

## 2023-06-06 DIAGNOSIS — I5082 Biventricular heart failure: Secondary | ICD-10-CM

## 2023-06-06 DIAGNOSIS — R600 Localized edema: Secondary | ICD-10-CM

## 2023-06-06 LAB — CBC

## 2023-06-06 MED ORDER — FUROSEMIDE 20 MG PO TABS: 20.0000 mg | ORAL_TABLET | Freq: Every day | ORAL | 1 refills | Status: DC

## 2023-06-06 NOTE — Progress Notes (Signed)
 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  Past Medical History    Past Medical History:  Diagnosis Date   Aortic aneurysm (HCC)    Arthritis    Left Knee   Cancer (HCC) 2022   Skin Cancer   GERD (gastroesophageal reflux disease)    Headache    Migraines   Heart murmur    Hyperlipidemia    Hypertension    Peripheral vascular disease (HCC)    Thoracic Aortic Aneurysm   Vertigo    Past Surgical History:  Procedure Laterality Date   AORTIC VALVE REPLACEMENT N/A 04/26/2023   Procedure: AORTIC VALVE REPLACEMENT (AVR) USING 23 MM KONECT RESILIA AORTIC VALVED CONDUIT;  Surgeon: Maryjane Mt, MD;  Location: [facility] OR;  Service: Open Heart Surgery;  Laterality: N/A;   APPENDECTOMY     1990s   CANNULATION FOR ECMO (EXTRACORPOREAL MEMBRANE OXYGENATION) N/A 04/26/2023   Procedure: CENTRAL CANNULATION FOR ECMO (EXTRACORPOREAL MEMBRANE OXYGENATION);  Surgeon: Maryjane Mt, MD;  Location: [facility] OR;  Service: Open Heart Surgery;  Laterality: N/A;   COLONOSCOPY WITH ESOPHAGOGASTRODUODENOSCOPY (EGD)     2020s   CORONARY ARTERY BYPASS GRAFT N/A 04/26/2023   Procedure: CORONARY ARTERY BYPASS GRAFTING (CABG) x 2 USING RIGHT AND LEFT GREATER SAPHENOUS VEIN HARVESTED OPEN;  Surgeon: Maryjane Mt, MD;  Location: [facility] OR;  Service: Open Heart Surgery;  Laterality:  N/A;   EXPLORATION POST OPERATIVE OPEN HEART N/A 04/26/2023   Procedure: EXPLORATION POST OPERATIVE OPEN HEART;  Surgeon: Maryjane Mt, MD;  Location: [facility] OR;  Service: Open Heart Surgery;  Laterality: N/A;   EXPLORATION POST OPERATIVE OPEN HEART N/A 04/27/2023   Procedure: EXPLORATION POST OPERATIVE OPEN HEART;  Surgeon: Maryjane Mt, MD;  Location: [facility] OR;  Service: Open Heart Surgery;  Laterality: N/A;   EYE SURGERY Right    eye tissue removed   MOUTH SURGERY  2022   Skin Cancer Removal   REPLACEMENT ASCENDING AORTA N/A 04/26/2023   Procedure: REPLACEMENT ASCENDING AORTA USING 23 MM KONECT RESILIA AORTIC VALVED CONDUIT;  Surgeon: Maryjane Mt, MD;  Location: [facility] OR;  Service: Open Heart Surgery;  Laterality: N/A;   RIGHT/LEFT HEART CATH AND CORONARY ANGIOGRAPHY N/A 12/28/2022   Procedure: RIGHT/LEFT HEART CATH AND CORONARY ANGIOGRAPHY;  Surgeon: Jordan, Peter M, MD;  Location: [facility] INVASIVE CV LAB;  Service: Cardiovascular;  Laterality: N/A;   STERIOD INJECTION  09/16/2011   Procedure: MINOR STEROID INJECTION;  Surgeon: Lacinda Ozell Mans, MD;  Location: [facility];  Service: Orthopedics;  Laterality: Left;  Left Lumbar Five-Sacral One Transforaminal Epidural Steroid Injection   STERNAL WOUND DEBRIDEMENT N/A 05/01/2023   Procedure: MEDIAL STERNAL WOUND DEBRIDEMENT;  Surgeon: Obadiah Coy, MD;  Location: [facility] OR;  Service: Thoracic;  Laterality: N/A;   TEE WITHOUT CARDIOVERSION N/A 11/05/2020   Procedure: TRANSESOPHAGEAL ECHOCARDIOGRAM (TEE);  Surgeon: Leavens Stanly DELENA, MD;  Location: [facility] ENDOSCOPY;  Service: Cardiovascular;  Laterality: N/A;   TEE WITHOUT CARDIOVERSION N/A 04/26/2023   Procedure: TRANSESOPHAGEAL ECHOCARDIOGRAM;  Surgeon: Maryjane Mt, MD;  Location: [facility] OR;  Service: Open Heart Surgery;  Laterality: N/A;   TEE WITHOUT CARDIOVERSION N/A 04/27/2023   Procedure: TRANSESOPHAGEAL ECHOCARDIOGRAM (TEE);  Surgeon: Maryjane Mt, MD;  Location: [facility] OR;  Service:  Open Heart Surgery;  Laterality: N/A;   TEE WITHOUT CARDIOVERSION N/A 05/01/2023   Procedure: TRANSESOPHAGEAL ECHOCARDIOGRAM (TEE);  Surgeon: Obadiah Coy, MD;  Location: [facility] OR;  Service: Thoracic;  Laterality: N/A;    Allergies  Allergies  Allergen Reactions   Levaquin [Levofloxacin]     unknown     Labs/Other Studies Reviewed    The following studies were reviewed today:  Cardiac Studies & Procedures   CARDIAC CATHETERIZATION  CARDIAC CATHETERIZATION 12/28/2022  Narrative Normal coronary anatomy Mildly elevated LV filling pressures. PCWP 21/20 with mean 19 mm Hg. LVEDP 23 mm Hg Mild pulmonary HTN. PAP 45/17 with mean 26 mm Hg Moderate Aortic stenosis by cath. Mean AV gradient 35 mm Hg with AVA 1.38 cm squared. Index 0.69. Cath data may underestimate severity of stenosis. Normal cardiac output 6.74 L/min, index 3.36  Plan: surgical consultation  Findings Coronary Findings Diagnostic  Dominance: Right  Left Main Vessel was injected. Vessel is normal in caliber. Vessel is angiographically normal.  Left Anterior Descending Vessel was injected. Vessel is normal in caliber. Vessel is angiographically normal.  Left Circumflex Vessel was injected. Vessel is normal in caliber. Vessel is angiographically normal.  Right Coronary Artery Vessel was injected. Vessel is normal in caliber. Vessel is angiographically normal.  Intervention  No interventions have been documented.    ECHOCARDIOGRAM  ECHOCARDIOGRAM COMPLETE 12/05/2022  Narrative ECHOCARDIOGRAM REPORT    [name] Date of Exam: 12/05/2022 Medical Rec #:  [id number]      Height:       66.0 in Accession #:    [id number]     Weight:       200.8 lb Date of Birth:  [birth year]       BSA:          2.003 m Patient Age:    57 years       BP:           135/85 mmHg Patient Gender: M              HR:           59 bpm. Exam Location:  [address]  Procedure: 2D Echo, Cardiac Doppler, Color  Doppler, 3D Echo and Strain Analysis  Indications:    Q23.1 Bicuspid aortic valve; I35.0 Nonrheumatic aortic (valve) stenosis  History:        Patient has prior history of Echocardiogram examinations, most recent 06/02/2022. Risk Factors:Hypertension and Former Smoker. Aneurysm of ascending aorta without rupture. Family history of sudden cardiac death.  Sonographer:    Jon Hacker RCS Referring Phys: [facility] A CHANDRASEKHAR  IMPRESSIONS   1. Left ventricular ejection fraction, by estimation, is 60 to 65%. The left ventricle has normal function. The left ventricle has no regional wall motion abnormalities. Left ventricular diastolic parameters were normal. 2. Right ventricular systolic function is normal. The right ventricular size is normal. 3. Left atrial size was moderately dilated. 4. The mitral valve is abnormal. Trivial mitral valve regurgitation. No evidence of mitral stenosis. 5. The aortic valve is bicuspid. There is severe calcifcation of the aortic valve. There is severe thickening of the aortic valve. Aortic valve  regurgitation is mild. Severe aortic valve stenosis. 6. Aortic dilatation noted. There is moderate dilatation of the ascending aorta, measuring 45 mm. 7. The inferior vena cava is normal in size with greater than 50% respiratory variability, suggesting right atrial pressure of 3 mmHg.  FINDINGS Left Ventricle: Left ventricular ejection fraction, by estimation, is 60 to 65%. The left ventricle has normal function. The left ventricle has no regional wall motion abnormalities. The left ventricular internal cavity size was normal in size. There is no left ventricular hypertrophy. Left ventricular diastolic parameters were normal.  Right Ventricle: The right ventricular size is normal. No increase in right ventricular wall thickness. Right ventricular systolic function is normal.  Left Atrium: Left atrial size was moderately dilated.  Right Atrium: Right atrial size  was normal in size.  Pericardium: There is no evidence of pericardial effusion.  Mitral Valve: The mitral valve is abnormal. There is mild thickening of the mitral valve leaflet(s). There is mild calcification of the mitral valve leaflet(s). Trivial mitral valve regurgitation. No evidence of mitral valve stenosis.  Tricuspid Valve: The tricuspid valve is normal in structure. Tricuspid valve regurgitation is mild . No evidence of tricuspid stenosis.  Aortic Valve: The aortic valve is bicuspid. There is severe calcifcation of the aortic valve. There is severe thickening of the aortic valve. Aortic valve regurgitation is mild. Severe aortic stenosis is present. Aortic valve mean gradient measures 39.0 mmHg. Aortic valve peak gradient measures 65.3 mmHg. Aortic valve area, by VTI measures 0.83 cm.  Pulmonic Valve: The pulmonic valve was normal in structure. Pulmonic valve regurgitation is mild. No evidence of pulmonic stenosis.  Aorta: Aortic dilatation noted. There is moderate dilatation of the ascending aorta, measuring 45 mm.  Venous: The inferior vena cava is normal in size with greater than 50% respiratory variability, suggesting right atrial pressure of 3 mmHg.  IAS/Shunts: No atrial level shunt detected by color flow Doppler.   LEFT VENTRICLE PLAX 2D LVIDd:         4.40 cm   Diastology LVIDs:         2.60 cm   LV e' medial:    9.46 cm/s LV PW:         0.90 cm   LV E/e' medial:  9.4 LV IVS:        0.80 cm   LV e' lateral:   16.30 cm/s LVOT diam:     2.00 cm   LV E/e' lateral: 5.5 LV SV:         83 LV SV Index:   41 LVOT Area:     3.14 cm  3D Volume EF: 3D EF:        59 % LV EDV:       125 ml LV ESV:       51 ml LV SV:        73 ml  RIGHT VENTRICLE RV Basal diam:  2.40 cm RV Mid diam:    2.60 cm RV S prime:     10.40 cm/s TAPSE (M-mode): 1.6 cm RVSP:           26.8 mmHg  LEFT ATRIUM             Index        RIGHT ATRIUM           Index LA diam:        4.30 cm 2.15  cm/m   RA Pressure: 3.00 mmHg LA Vol (A2C):   52.7 ml  26.31 ml/m  RA Area:     8.89 cm LA Vol (A4C):   38.1 ml 19.02 ml/m  RA Volume:   13.90 ml  6.94 ml/m LA Biplane Vol: 45.7 ml 22.81 ml/m AORTIC VALVE AV Area (Vmax):    0.76 cm AV Area (Vmean):   0.75 cm AV Area (VTI):     0.83 cm AV Vmax:           404.00 cm/s AV Vmean:          290.000 cm/s AV VTI:            0.990 m AV Peak Grad:      65.3 mmHg AV Mean Grad:      39.0 mmHg LVOT Vmax:         97.40 cm/s LVOT Vmean:        69.300 cm/s LVOT VTI:          0.263 m LVOT/AV VTI ratio: 0.27  AORTA Ao Root diam: 3.00 cm Ao Asc diam:  4.50 cm  MITRAL VALVE               TRICUSPID VALVE MV Area (PHT): 2.33 cm    TR Peak grad:   23.8 mmHg MV Decel Time: 326 msec    TR Vmax:        244.00 cm/s MV E velocity: 89.10 cm/s  Estimated RAP:  3.00 mmHg MV A velocity: 79.70 cm/s  RVSP:           26.8 mmHg MV E/A ratio:  1.12 SHUNTS Systemic VTI:  0.26 m Systemic Diam: 2.00 cm  Maude Emmer MD Electronically signed by Maude Emmer MD Signature Date/Time: 12/05/2022/5:09:36 PM    Final  TEE  ECHO INTRAOPERATIVE TEE 04/27/2023  Narrative *INTRAOPERATIVE TRANSESOPHAGEAL REPORT *    Patient Name:   HOLTON SIDMAN Date of Exam: 04/27/2023 Medical Rec #:  [id number]      Height:       66.0 in Accession #:    [id number]     Weight:       216.9 lb Date of Birth:  1966-05-02       BSA:          2.07 m Patient Age:    57 years       BP:           118/93 mmHg Patient Gender: M              HR:           89 bpm. Exam Location:  Anesthesiology  Transesophogeal exam was perform intraoperatively during surgical procedure. Patient was closely monitored under general anesthesia during the entirety of examination.  Indications:     S/p aortic valve replacement, s/p VA ECMO cannulation, open chest Performing Phys: [id number] DEWARD KALLMAN  Complications: No known complications during this procedure. PRE-OP FINDINGS Left Ventricle:  The left ventricle has mild-moderately reduced systolic function, with Johnathan ejection fraction of 40-45%. The cavity size was normal. There is hypokinesis of the anterioseptal, septal, inferoseptal, and inferior walls.    Right Ventricle: The right ventricle has moderately reduced systolic function. The cavity was normal. There is no increase in right ventricular wall thickness.  Left Atrium: Left atrial size was normal in size. No left atrial/left atrial appendage thrombus was detected.  Right Atrium: Right atrial size was normal in size.  Interatrial Septum: No atrial level shunt detected by color flow Doppler.  Pericardium: There is no evidence of pericardial effusion.  Mitral Valve: The mitral valve is normal in structure. Mitral valve regurgitation is mild by color flow Doppler.  Tricuspid Valve: The tricuspid valve was normal in structure. Tricuspid valve regurgitation is mild by color flow Doppler.  Aortic Valve: The aortic valve has been repaired/replaced Aortic valve regurgitation was not visualized by color flow Doppler. There is a 23mm Inspiris valve in the aortic position. The valve appears well seated.    Pulmonic Valve: The pulmonic valve was normal in structure. Pulmonic valve regurgitation is not visualized by color flow Doppler.   Aorta: S/p Aortic root replacement.   Garnette Skillern MD Electronically signed by Garnette Skillern MD Signature Date/Time: 04/27/2023/4:12:48 PM    Final   CT SCANS  CT CORONARY MORPH W/CTA COR W/SCORE 11/30/2020  Addendum 11/30/2020  5:48 PM ADDENDUM REPORT: 11/30/2020 17:46  CLINICAL DATA:  Aortic Stenosis Assessment  EXAM: Cardiac TAVR CT  TECHNIQUE: The patient was scanned on a Siemens Force 192 slice scanner. A 120 kV retrospective scan was triggered in the descending thoracic aorta at 111 HU's. Gantry rotation speed was 270 msecs and collimation was .9 mm. No beta blockade or nitro were given. The 3D data set was reconstructed  in 5% intervals of the R-R cycle. Systolic and diastolic phases were analyzed on a dedicated work station using MPR, MIP and VRT modes. The patient received 100 cc of contrast.  FINDINGS: Aortic Valve: Moderately thickened Siever's 1 bicuspid aortic valve with the planimeter valve area is 2.62 cm2  LVOT calcification: None  Annular calcification: Minimal  Aortic Valve Calcium  Score: 420  Prosthetic Valve: NA  Mitral Valve: No mitral annular calcification  Left Ventricular Function: LVEF 60% without wall motion abnormality  Aortic Annulus Measurements  Major annulus diameter: 28 mm  Minor annulus diameter: 21 mm  Annular perimeter: 76 mm  Annular area: 4.43 cm2  Aortic Root Measurements  Sinus of Valsalva perimeter: 93 mm  Sinotubular Junction: 2.5 cm  Ascending Thoracic Aorta: 45 mm  Aortic Arch: 37 mm  Descending Thoracic Aorta: 23 mm  Sinus of Valsalva Measurements:  Right coronary cusp width: 26 mm  Left coronary cusp width: 26 mm  Non coronary cusp width: 29 mm  Coronary Artery Height above Annulus:  Left Main: 11 mm  Right Coronary: 8 mm  Coronary Arteries: No nitroglycerin  given for coronary artery patency assessment  Coronary Artery Calcium  score:  Coronary Calcium  Score:  Left main: 0  Left anterior descending artery: 0.366  Left circumflex artery: 0  Right coronary artery: 0  Total: 0.366  Percentile: 56th for age, sex, and race matched control.  Optimum Fluoroscopic Angle for Delivery if TAVR were deployed: LAO 2, CAU 2  IMPRESSION: 1. Anatomically mild aortic stenosis of a bicuspid valve. Findings pertinent to TAVR procedure are detailed above, though at this time valve team assessment is deferred.  2. Moderate ascending aortic aneurysm without evidence of concomitant coarctation of the aorta.  3. Patient's total coronary artery calcium  score is 0.366, which is 56th percentile for subjects of the same age, gender, and  race based populations.  RECOMMENDATIONS:  Coronary artery calcium  (CAC) score is a strong predictor of incident coronary heart disease (CHD) and provides predictive information beyond traditional risk factors. CAC scoring is reasonable to use in the decision to withhold, postpone, or initiate statin therapy in intermediate-risk or selected borderline-risk asymptomatic adults (age 70-75 years and LDL-C >=70 to <190 mg/dL) who do not have diabetes or established atherosclerotic cardiovascular disease (ASCVD).* In intermediate-risk (10-year ASCVD  risk >=7.5% to <20%) adults or selected borderline-risk (10-year ASCVD risk >=5% to <7.5%) adults in whom a CAC score is measured for the purpose of making a treatment decision the following recommendations have been made:  If CAC = 0, it is reasonable to withhold statin therapy and reassess in 5 to 10 years, as long as higher risk conditions are absent (diabetes mellitus, family history of premature CHD in first degree relatives (males <55 years; females <65 years), cigarette smoking, LDL >=190 mg/dL or other independent risk factors).  If CAC is 1 to 99, it is reasonable to initiate statin therapy for patients >=66 years of age.  If CAC is >=100 or >=75th percentile, it is reasonable to initiate statin therapy at any age.  Cardiology referral should be considered for patients with CAC scores >=400 or >=75th percentile.  *2018 AHA/ACC/AACVPR/AAPA/ABC/ACPM/ADA/AGS/APhA/ASPC/NLA/PCNA Guideline on the Management of Blood Cholesterol: A Report of the American College of Cardiology/American Heart Association Task Force on Clinical Practice Guidelines. J Am Coll Cardiol. 2019;73(24):3168-3209.  Mahesh  Chandrasekhar   Electronically Signed By: Stanly Leavens MD On: 11/30/2020 17:46  Narrative EXAM: OVER-READ INTERPRETATION  CT CHEST  The following report is Johnathan over-read performed by radiologist Dr. JONETTA Faes of [facility] on 11/30/2020. This over-read does not include interpretation of cardiac or coronary anatomy or pathology. The coronary CTA interpretation by the cardiologist is attached.  COMPARISON:  None.  FINDINGS: Vascular: Heart size normal. No pericardial effusion. Fair contrast opacification of central pulmonary arteries without evident filling defect to suggest acute PE. Aortic valve leaflet coarse calcifications. 4.4 cm ascending thoracic aortic aneurysm. No aortic dissection or stenosis. 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  Mediastinum/Nodes: No mass or adenopathy.  Lungs/Pleura: No pleural effusion. No pneumothorax. 3 mm subpleural nodule, superior segment right lower lobe (Im41,Se10) . 3 mm subpleural nodule, lateral basal segment left lower lobe image 55. Lungs otherwise clear.  Upper Abdomen: See separate CTA abdomen from the same  Musculoskeletal: No chest wall mass or suspicious bone lesions identified.  IMPRESSION: Small bilateral pulmonary nodules less than 4 mm. No follow-up needed if patient is low-risk. Non-contrast chest CT can be considered in 12 months if patient is high-risk. This recommendation follows the consensus statement: Guidelines for Management of Incidental Pulmonary Nodules Detected on CT Images: From the Fleischner Society 2017; Radiology 2017; 284:228-243.  4.4 cm ascending thoracic aortic aneurysm. Recommend annual imaging followup by CTA or MRA. This recommendation follows 2010 ACCF/AHA/AATS/ACR/ASA/SCA/SCAI/SIR/STS/SVM Guidelines for the Diagnosis and Management of Patients with Thoracic Aortic Disease. Circulation. 2010; 121: z733-z630  Electronically Signed: By: JONETTA Faes M.D. On: 11/30/2020 12:43         Recent Labs: 05/01/2023: ALT 45; BUN 38; Creatinine, Ser 1.26; Hemoglobin 8.5; Magnesium  2.2; Platelets 68; Potassium 4.0; Sodium 146  Recent Lipid Panel    Component Value  Date/Time   CHOL 133 12/20/2021 0732   TRIG 116 12/20/2021 0732   HDL 40 12/20/2021 0732   CHOLHDL 3.3 12/20/2021 0732   LDLCALC 72 12/20/2021 0732    History of Present Illness    57 year old male with the above past medical history including aortic stenosis due to bicuspid aortic valve, moderate aortic root dilation, s/p elective AVR with Bentall procedure in 04/2023 requiring CABG x 2 (SVG-RCA, and LAD) in the setting of cardiac arrest, biventricular heart failure, left IJ, left atrial thrombus, intermittent heart block s/p Micra device, hypertension, hyperlipidemia, 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  He has a history of aortic stenosis due to bicuspid aortic valve, moderate aortic root dilation.  He was last seen in the office on 12/26/2022 was stable from a cardiac standpoint. Cardiac catheterization in 12/2022 revealed normal coronary anatomy, mildly elevated LV filling pressures, mild pulmonary hypertension, moderate aortic stenosis.  He was referred to CT surgery and underwent elective AVR with Bentall procedure on 04/26/2023.  Surgery was complicated by polymorphic VT, bleeding, and cardiac arrest requiring CABG x 2 (SVG-RCA and LAD), with severe biventricular heart failure postoperatively.  He was taken back to the OR on 05/01/2023 for exploration and was centrally cannulated for VA ECMO with open chest.  He was transferred to [facility] on 05/02/2023 for possible transplant.  He ultimately underwent chest closure and wound VAC placement on 05/06/2023.  He was extubated on 05/09/2023 required reintubation for hypoxic respiratory failure on 05/15/2023.  He was started on anticoagulation in the setting of left IJ thrombus, left atrial thrombus noted on intraoperative imaging.  Echocardiogram on 05/12/2023 showed normal LV systolic function, mild LVH, estimated EF 55%, indeterminate diastolic function, normal RV systolic function, stable bioprosthetic aortic valve.  He underwent  placement of Micra device on 05/18/2023 in the setting of intermittent heart block.  He was discharged home with home health on 05/30/2023.  He contacted his cardiologist at [facility] on 06/05/2023 with concern for lower extremity edema, right greater than left.  He noted insurance did not improve his Xarelto prescription, he had not taken it since his hospital discharge.  [facility] cardiology advised him to follow-up with his local cardiologist.  He presents today for follow-up accompanied by his wife and son.  Since his hospitalization he has been stable overall from a cardiac standpoint.  He notes some shortness of breath with activity, elevated heart rate with activity, likely in the setting of extreme physical deconditioning.  He has yet to receive home health.  Additionally he has not had wound care follow-up.  He has bilateral 1+ pitting lower extremity edema, right greater than left, mild orthopnea. He has not been weighing himself daily.  He has follow-up with his PCP next week and with [facility] cardiology.  He has discoloration to his toes on his left foot, which is concerning to him.   Home Medications    Current Outpatient Medications  Medication Sig Dispense Refill   acetaminophen  (TYLENOL ) 325 MG tablet Take 325 mg by mouth as needed.     aspirin  81 MG chewable tablet Chew 81 mg by mouth daily.     atorvastatin (LIPITOR) 40 MG tablet Take 1 tablet by mouth at bedtime.     Chlorhexidine  Gluconate Cloth 2 % PADS Apply 6 each topically daily at 6 (six) AM.     chlorproMAZINE  25 mg in sodium chloride  0.9 % 25 mL Inject 25 mg into the vein every 8 (eight) hours as needed.     docusate (COLACE) 50 MG/5ML liquid Place 10 mLs (100 mg total) into feeding tube 2 (two) times daily.     DOPamine  (INTROPIN ) 3.2-5 MG/ML-% infusion Inject 301.5 mcg/min into the vein continuous.     furosemide  (LASIX ) 20 MG tablet Take 1 tablet (20 mg total) by mouth daily. 90 tablet 1   furosemide  200 mg in dextrose  5 % 80 mL  Inject 4 mg/hr into the vein continuous.     gabapentin  (NEURONTIN ) 250 MG/5ML solution Take 4 mLs (200 mg total) by mouth every 8 (eight) hours.     HYDROmorphone  (  DILAUDID ) 2 mg/mL SOLN Inject 0.25-2 mg into the vein every 30 (thirty) minutes as needed (to achieve RASS & CPOT goal, breakthrough pain.).     HYDROmorphone  (DILAUDID ) 2 mg/mL SOLN Inject 1 mg into the vein every 30 (thirty) minutes as needed (to achieve RASS & CPOT goal, breakthrough pain.).     HYDROmorphone  50 mg in sodium chloride  0.9 % 45 mL Inject 0.5-4 mg/hr into the vein continuous.     insulin  aspart (NOVOLOG ) 100 UNIT/ML injection Inject 0-24 Units into the skin every 4 (four) hours.     meropenem  1 g in sodium chloride  0.9 % 100 mL Inject 1 g into the vein every 8 (eight) hours.     midazolam  (VERSED ) 1 mg/mL SOLN Inject 1-2 mg into the vein every hour as needed.     midazolam -sodium chloride  100-0.9 MG/100ML-% SOLN Inject 4-10 mg/hr into the vein continuous.     Morphine  Sulfate (MORPHINE , PF,) 2 MG/ML injection Inject 0.5-2 mLs (1-4 mg total) into the vein every hour as needed.     norepinephrine  (LEVOPHED ) 16-5 MG/250ML-% SOLN Inject 0-40 mcg/min into the vein continuous.     Nutritional Supplements (FEEDING SUPPLEMENT, PIVOT 1.5 CAL,) LIQD Place 1,000 mLs into feeding tube continuous.     ondansetron  (ZOFRAN ) 4 MG/2ML SOLN injection Inject 2 mLs (4 mg total) into the vein every 6 (six) hours as needed for nausea or vomiting.     oxyCODONE  (OXY IR/ROXICODONE ) 5 MG immediate release tablet Take 1-2 tablets (5-10 mg total) by mouth every 3 (three) hours as needed for severe pain (pain score 7-10).     pantoprazole  (PROTONIX ) 40 MG injection Inject 40 mg into the vein at bedtime.     polyethylene glycol (MIRALAX  / GLYCOLAX ) 17 g packet Place 17 g into feeding tube daily.     Protein (FEEDING SUPPLEMENT, PROSOURCE TF20,) liquid Place 60 mLs into feeding tube 2 (two) times daily.     vancomycin  (VANCOREADY) 1250 MG/250ML SOLN  Inject 250 mLs (1,250 mg total) into the vein every 12 (twelve) hours.     Water  For Irrigation, Sterile (FREE WATER ) SOLN Place 200 mLs into feeding tube every 2 (two) hours.     XARELTO 20 MG TABS tablet Take 20 mg by mouth daily.     No current facility-administered medications for this visit.     Review of Systems    He denies chest pain, palpitations, pnd, n, v, dizziness, syncope, weight gain, or early satiety. All other systems reviewed and are otherwise negative except as noted above.   Physical Exam    VS:  BP 104/74 (BP Location: Left Arm, Patient Position: Sitting, Cuff Size: Normal)   Pulse 94   Ht 5' 6 (1.676 m)   Wt 188 lb 12.8 oz (85.6 kg)   BMI 30.47 kg/m   GEN: Well nourished, well developed, in no acute distress. HEENT: normal. Neck: Supple, no JVD, carotid bruits, or masses. Cardiac: RRR, no murmurs, rubs, or gallops. No clubbing, cyanosis, 1+ bilateral lower extremity edema.  Discoloration to toes on left foot.  Radials/DP/PT 2+ and equal bilaterally.  Respiratory:  Respirations regular and unlabored, clear to auscultation bilaterally. GI: Soft, nontender, nondistended, BS + x 4. MS: no deformity or atrophy. Skin: warm and dry, no rash.  Dressing to right posterior head, dressing to midsternal incision. Neuro:  Strength and sensation are intact. Psych: Normal affect.  Accessory Clinical Findings    ECG personally reviewed by me today - EKG Interpretation Date/Time:  Tuesday  June 06 2023 09:52:59 EST Ventricular Rate:  94 PR Interval:  220 QRS Duration:  146 QT Interval:  396 QTC Calculation: 495 R Axis:   61  Text Interpretation: Sinus rhythm with 1st degree A-V block Right bundle branch block T wave abnormality, consider inferior ischemia When compared with ECG of 29-Apr-2023 03:05, Sinus rhythm has replaced Electronic ventricular pacemaker Confirmed by Daneen Perkins ([id number]) on 06/06/2023 10:03:54 AM  - no acute changes.   Lab Results  Component  Value Date   WBC 9.4 05/01/2023   HGB 8.5 (L) 05/01/2023   HCT 25.0 (L) 05/01/2023   MCV 89.5 05/01/2023   PLT 68 (L) 05/01/2023   Lab Results  Component Value Date   CREATININE 1.26 (H) 05/01/2023   BUN 38 (H) 05/01/2023   NA 146 (H) 05/01/2023   K 4.0 05/01/2023   CL 106 05/01/2023   CO2 29 05/01/2023   Lab Results  Component Value Date   ALT 45 (H) 05/01/2023   AST 90 (H) 05/01/2023   ALKPHOS 67 05/01/2023   BILITOT 5.1 (H) 05/01/2023   Lab Results  Component Value Date   CHOL 133 12/20/2021   HDL 40 12/20/2021   LDLCALC 72 12/20/2021   TRIG 116 12/20/2021   CHOLHDL 3.3 12/20/2021    Lab Results  Component Value Date   HGBA1C 5.5 04/24/2023    Assessment & Plan    1. Aortic stenosis/bicuspid aortic valve/moderate aortic root dilation: S/p elective AVR with Bentall procedure in 04/2023 requiring CABG x 2 (SVG-RCA, and LAD) in the setting of cardiac arrest, biventricular heart failure.  Most recent echo on 05/12/2023 showed stable bioprosthetic valve.  Will defer timing of further postop echo to [facility].  Continue SBE prophylaxis.  2. S/p cardiac arrest/biventricular heart failure/lower extremity edema: Required ECMO/Impella support, s/p CABG x 2.  Echo on 05/12/2023 showed normal LV systolic function, mild LVH, estimated EF 55%, indeterminate diastolic function, normal RV systolic function, stable bioprosthetic aortic valve.Since hospital discharge he has had increased bilateral lower extremity edema, right greater than left, mild orthopnea. He has not been weighing himself daily. Will start Lasix  20 mg daily.  Will check BMET, BNP, CBC today.  Advised him to monitor daily weights and report weight gain of 3 pounds overnight, 5 pounds in a week.  He will have a BMET in 1 week with his PCP.  Additionally, he has dry gangrenous toes to the left foot.  Discussed with Dr. Barbaraann, DOD, will refer to vascular surgery for ongoing management.  Continue aspirin , Lipitor.  3. Left  IJ/left atrial thrombus: On Xarelto.  4. Intermittent heart block/tachycardia: S/p Micra device. Will need to determine who will be following this going forward.  He has had elevated heart rate with minimal activity.  Continue to monitor.   5. Hypertension: BP well controlled, borderline low.  He is not on any antihypertensive medications at this time.   6. Hyperlipidemia: No recent LDL on file.  He was started on atorvastatin.  Will update fasting lipids, CMET in 2 months.  Continue Lipitor.  7. Disposition: Follow-up with PCP for wound care management, home health needs, follow-up with [facility] cardiology, follow-up with heart care APP in 6 weeks, follow-up next available with Dr. Santo.       Perkins JAYSON Daneen, NP 06/06/2023, 12:10 PM

## 2023-06-06 NOTE — Patient Instructions (Signed)
 Medication Instructions:  START LASIX  20 MG DAILY.    Lab Work: CBC, BMET, AND BNP TO BE DONE TODAY. BMET IN 1 WEEK WITH PCP. CMET AND FASTING LIPID PANEL IN 2 MONTHS.     Testing/Procedures: NONE   Follow-Up: At [facility], you and your health needs are our priority.  As part of our continuing mission to provide you with exceptional heart care, we have created designated Provider Care Teams.  These Care Teams include your primary Cardiologist (physician) and Advanced Practice Providers (APPs -  Physician Assistants and Nurse Practitioners) who all work together to provide you with the care you need, when you need it.       Your next appointment:   FIRST AVAILABLE WITH Mahesh DELENA Leavens, MD 6 WEEKS WITH APP    Other Instructions IF YOU GAIN 3 POUNDS WITHIN A NIGHT OR 5 POUNDS WITHIN A WEEK PLEASE GIVE OUR OFFICE A CALL. Daily Weight Record It is important to weigh yourself daily. To do this: Make sure you use a reliable scale. Use the same scale each day. Keep this daily weight chart near your scale. Weigh yourself each morning at the same time after you use the bathroom. Before weighing yourself: Take off your shoes. Make sure you are wearing the same amount of clothing each day. Write down your weight in the spaces on the form. Compare today's weight to yesterday's weight. Bring this form with you to your follow-up visits with your health care provider. Call your health care provider if you have concerns about your weight, including rapid weight gain or loss. Date: ________ Weight: ____________________ Date: ________ Weight: ____________________ Date: ________ Weight: ____________________ Date: ________ Weight: ____________________ Date: ________ Weight: ____________________ Date: ________ Weight: ____________________ Date: ________ Weight: ____________________ Date: ________ Weight: ____________________ Date: ________ Weight: ____________________ Date:  ________ Weight: ____________________ Date: ________ Weight: ____________________ Date: ________ Weight: ____________________ Date: ________ Weight: ____________________ Date: ________ Weight: ____________________ Date: ________ Weight: ____________________ Date: ________ Weight: ____________________ Date: ________ Weight: ____________________ Date: ________ Weight: ____________________ Date: ________ Weight: ____________________ Date: ________ Weight: ____________________ Date: ________ Weight: ____________________ Date: ________ Weight: ____________________ Date: ________ Weight: ____________________ Date: ________ Weight: ____________________ Date: ________ Weight: ____________________ Date: ________ Weight: ____________________ Date: ________ Weight: ____________________ Date: ________ Weight: ____________________ Date: ________ Weight: ____________________ Date: ________ Weight: ____________________ Date: ________ Weight: ____________________ Date: ________ Weight: ____________________ Date: ________ Weight: ____________________ Date: ________ Weight: ____________________ Date: ________ Weight: ____________________ Date: ________ Weight: ____________________ Date: ________ Weight: ____________________ Date: ________ Weight: ____________________ Date: ________ Weight: ____________________ Date: ________ Weight: ____________________ Date: ________ Weight: ____________________ Date: ________ Weight: ____________________ Date: ________ Weight: ____________________ Date: ________ Weight: ____________________ Date: ________ Weight: ____________________ Date: ________ Weight: ____________________ Date: ________ Weight: ____________________ Date: ________ Weight: ____________________ Date: ________ Weight: ____________________ Date: ________ Weight: ____________________ This information is not intended to replace advice given to you by your health care provider. Make sure you discuss any questions  you have with your health care provider. Document Revised: 01/26/2021 Document Reviewed: 01/26/2021 [employer] Patient Education  2024 [employer].

## 2023-06-08 ENCOUNTER — Other Ambulatory Visit: Payer: Self-pay

## 2023-06-08 DIAGNOSIS — I739 Peripheral vascular disease, unspecified: Secondary | ICD-10-CM

## 2023-06-08 LAB — CBC
Hematocrit: 33.4 % — ABNORMAL LOW (ref 37.5–51.0)
Hemoglobin: 11 g/dL — ABNORMAL LOW (ref 13.0–17.7)
MCH: 32.5 pg (ref 26.6–33.0)
MCHC: 32.9 g/dL (ref 31.5–35.7)
MCV: 99 fL — ABNORMAL HIGH (ref 79–97)
Platelets: 268 10*3/uL (ref 150–450)
RBC: 3.38 x10E6/uL — ABNORMAL LOW (ref 4.14–5.80)
RDW: 15.2 % (ref 11.6–15.4)
WBC: 7.2 10*3/uL (ref 3.4–10.8)

## 2023-06-08 LAB — BASIC METABOLIC PANEL
BUN/Creatinine Ratio: 14 (ref 9–20)
BUN: 12 mg/dL (ref 6–24)
CO2: 21 mmol/L (ref 20–29)
Calcium: 9.5 mg/dL (ref 8.7–10.2)
Chloride: 102 mmol/L (ref 96–106)
Creatinine, Ser: 0.84 mg/dL (ref 0.76–1.27)
Glucose: 80 mg/dL (ref 70–99)
Potassium: 4.2 mmol/L (ref 3.5–5.2)
Sodium: 138 mmol/L (ref 134–144)
eGFR: 100 mL/min/{1.73_m2} (ref >59)

## 2023-06-08 LAB — BRAIN NATRIURETIC PEPTIDE: BNP: 195.6 pg/mL — ABNORMAL HIGH (ref 0.0–100.0)

## 2023-06-15 ENCOUNTER — Ambulatory Visit: Payer: Commercial Managed Care - HMO | Admitting: Vascular Surgery

## 2023-06-15 ENCOUNTER — Encounter: Payer: Self-pay | Admitting: Vascular Surgery

## 2023-06-15 ENCOUNTER — Ambulatory Visit (HOSPITAL_COMMUNITY)
Admission: RE | Admit: 2023-06-15 | Discharge: 2023-06-15 | Disposition: A | Discharge status: Home / Self Care | Payer: Commercial Managed Care - HMO | Source: Ambulatory Visit | Attending: Vascular Surgery | Admitting: Vascular Surgery

## 2023-06-15 VITALS — BP 113/79 | HR 91 | Temp 98.3°F | Resp 20 | Ht 66.0 in | Wt 190.0 lb

## 2023-06-15 DIAGNOSIS — I739 Peripheral vascular disease, unspecified: Secondary | ICD-10-CM | POA: Insufficient documentation

## 2023-06-15 DIAGNOSIS — I96 Gangrene, not elsewhere classified: Secondary | ICD-10-CM

## 2023-06-15 LAB — VAS US ABI WITH/WO TBI
Left ABI: 1.12
Right ABI: 1.13

## 2023-06-15 NOTE — Progress Notes (Signed)
 Office Note     CC: Left second toe gangrene Requesting Provider:  Leonel Cole, MD  HPI: Johnathan Anderson is a 58 y.o. ([birth year]) male presenting at the request of .Leonel Cole, MD for evaluation of left second toe gangrene and possible peripheral arterial disease.  On exam, Johnathan Anderson was doing well, accompanied by his wife.  A native of [place], [place] and was originally in [place]  prior to [place].  He met his wife at church at our [place] [employer].  They have been married now for over a decade.  Last November Johnathan Anderson presented with bicuspid aortic valve and moderate aortic root dilation.  He underwent elective AVR with Bentall procedure and CABG x 2 due to intraoperative biventricular heart failure.  Immediate postop course was complicated by VT arrest requiring defibrillation, CPR, ECMO.  He was found to have severe RV failure.  This RV failure continued, and he was transferred to [facility] via [facility] for consideration of heart transplant.  Fortunately, he began to recover.  He did not require transplant.    When this occurred, he had wounds on bilateral lower extremities.  All of these have healed other than the left second toe.  He denies drainage, denies significant erythema, but does note that the end is black. He is using a walker to ambulate, and is still working to get his strength back.  Overall, he was all smiles and happy to be alive.   Past Medical History:  Diagnosis Date   Aortic aneurysm [facility])    Arthritis    Left Knee   Cancer (HCC) 2022   Skin Cancer   GERD (gastroesophageal reflux disease)    Headache    Migraines   Heart murmur    Hyperlipidemia    Hypertension    Peripheral vascular disease (HCC)    Thoracic Aortic Aneurysm   Vertigo     Past Surgical History:  Procedure Laterality Date   AORTIC VALVE REPLACEMENT N/A 04/26/2023   Procedure: AORTIC VALVE REPLACEMENT (AVR) USING 23 MM KONECT RESILIA AORTIC VALVED  CONDUIT;  Surgeon: Maryjane Mt, MD;  Location: [facility] OR;  Service: Open Heart Surgery;  Laterality: N/A;   APPENDECTOMY     1990s   CANNULATION FOR ECMO (EXTRACORPOREAL MEMBRANE OXYGENATION) N/A 04/26/2023   Procedure: CENTRAL CANNULATION FOR ECMO (EXTRACORPOREAL MEMBRANE OXYGENATION);  Surgeon: Maryjane Mt, MD;  Location[facility];  Service: Open Heart Surgery;  Laterality: N/A;   COLONOSCOPY WITH ESOPHAGOGASTRODUODENOSCOPY (EGD)     2020s   CORONARY ARTERY BYPASS GRAFT N/A 04/26/2023   Procedure: CORONARY ARTERY BYPASS GRAFTING (CABG) x 2 USING RIGHT AND LEFT GREATER SAPHENOUS VEIN HARVESTED OPEN;  Surgeon: Maryjane Mt, MD;  Location: [facility] OR;  Service: Open Heart Surgery;  Laterality: N/A;   EXPLORATION POST OPERATIVE OPEN HEART N/A 04/26/2023   Procedure: EXPLORATION POST OPERATIVE OPEN HEART;  Surgeon: Maryjane Mt, MD;  Location: [facility] OR;  Service: Open Heart Surgery;  Laterality: N/A;   EXPLORATION POST OPERATIVE OPEN HEART N/A 04/27/2023   Procedure: EXPLORATION POST OPERATIVE OPEN HEART;  Surgeon: Maryjane Mt, MD;  Location: [facility] OR;  Service: Open Heart Surgery;  Laterality: N/A;   EYE SURGERY Right    eye tissue removed   MOUTH SURGERY  2022   Skin Cancer Removal   REPLACEMENT ASCENDING AORTA N/A 04/26/2023   Procedure: REPLACEMENT ASCENDING AORTA USING 23 MM KONECT RESILIA AORTIC VALVED CONDUIT;  Surgeon: Maryjane Mt, MD;  Location: [facility] OR;  Service: Open Heart Surgery;  Laterality: N/A;   RIGHT/LEFT HEART CATH AND CORONARY ANGIOGRAPHY N/A 12/28/2022   Procedure: RIGHT/LEFT HEART CATH AND CORONARY ANGIOGRAPHY;  Surgeon: Jordan, Peter M, MD;  Location: [facility] INVASIVE CV LAB;  Service: Cardiovascular;  Laterality: N/A;   STERIOD INJECTION  09/16/2011   Procedure: MINOR STEROID INJECTION;  Surgeon: Lacinda Ozell Mans, MD;  Location: [facility];  Service: Orthopedics;  Laterality: Left;  Left Lumbar Five-Sacral One Transforaminal Epidural Steroid Injection   STERNAL WOUND  DEBRIDEMENT N/A 05/01/2023   Procedure: MEDIAL STERNAL WOUND DEBRIDEMENT;  Surgeon: Obadiah Coy, MD;  Location: [facility] OR;  Service: Thoracic;  Laterality: N/A;   TEE WITHOUT CARDIOVERSION N/A 11/05/2020   Procedure: TRANSESOPHAGEAL ECHOCARDIOGRAM (TEE);  Surgeon: Santo Stanly LABOR, MD;  Location: [facility] ENDOSCOPY;  Service: Cardiovascular;  Laterality: N/A;   TEE WITHOUT CARDIOVERSION N/A 04/26/2023   Procedure: TRANSESOPHAGEAL ECHOCARDIOGRAM;  Surgeon: Maryjane Mt, MD;  Location: [facility] OR;  Service: Open Heart Surgery;  Laterality: N/A;   TEE WITHOUT CARDIOVERSION N/A 04/27/2023   Procedure: TRANSESOPHAGEAL ECHOCARDIOGRAM (TEE);  Surgeon: Maryjane Mt, MD;  Location: [facility] OR;  Service: Open Heart Surgery;  Laterality: N/A;   TEE WITHOUT CARDIOVERSION N/A 05/01/2023   Procedure: TRANSESOPHAGEAL ECHOCARDIOGRAM (TEE);  Surgeon: Obadiah Coy, MD;  Location: [facility] OR;  Service: Thoracic;  Laterality: N/A;    Social History   Socioeconomic History   Marital status: Married    Spouse name: Not on file   Number of children: Not on file   Years of education: Not on file   Highest education level: Not on file  Occupational History   Not on file  Tobacco Use   Smoking status: Former    Types: Cigarettes   Smokeless tobacco: Never   Tobacco comments:    Pt quit smoking cigarettes in 2005. 1 pack of cigarettes per week.  Vaping Use   Vaping status: Never Used  Substance and Sexual Activity   Alcohol use: No   Drug use: No   Sexual activity: Yes  Other Topics Concern   Not on file  Social History Narrative   Not on file   Social Drivers of Health   Financial Resource Strain: Medium Risk (08/12/2022)   Overall Financial Resource Strain (CARDIA)    Difficulty of Paying Living Expenses: Somewhat hard  Food Insecurity: No Food Insecurity (04/27/2023)   Hunger Vital Sign    Worried About Running Out of Food in the Last Year: Never true    Ran Out of Food in the Last Year: Never true   Transportation Needs: No Transportation Needs (05/02/2023)   Received from [facility] - Transportation    In the past 12 months, has lack of transportation kept you from medical appointments or from getting medications?: No    Lack of Transportation (Non-Medical): No  Physical Activity: Not on file  Stress: Not on file  Social Connections: Not on file  Intimate Partner Violence: Not At Risk (04/27/2023)   Humiliation, Afraid, Rape, and Kick questionnaire    Fear of Current or Ex-Partner: No    Emotionally Abused: No    Physically Abused: No    Sexually Abused: No   Family History  Problem Relation Age of Onset   Hypertension Mother    Diabetes Father    Migraines Neg Hx     Current Outpatient Medications  Medication Sig Dispense Refill   aspirin  81 MG chewable tablet Chew 81 mg by mouth daily.  atorvastatin (LIPITOR) 40 MG tablet Take 1 tablet by mouth at bedtime.     Chlorhexidine  Gluconate Cloth 2 % PADS Apply 6 each topically daily at 6 (six) AM.     chlorproMAZINE  25 mg in sodium chloride  0.9 % 25 mL Inject 25 mg into the vein every 8 (eight) hours as needed.     docusate (COLACE) 50 MG/5ML liquid Place 10 mLs (100 mg total) into feeding tube 2 (two) times daily.     DOPamine  (INTROPIN ) 3.2-5 MG/ML-% infusion Inject 301.5 mcg/min into the vein continuous.     furosemide  (LASIX ) 20 MG tablet Take 1 tablet (20 mg total) by mouth daily. 90 tablet 1   furosemide  200 mg in dextrose  5 % 80 mL Inject 4 mg/hr into the vein continuous.     gabapentin  (NEURONTIN ) 250 MG/5ML solution Take 4 mLs (200 mg total) by mouth every 8 (eight) hours.     HYDROmorphone  (DILAUDID ) 2 mg/mL SOLN Inject 0.25-2 mg into the vein every 30 (thirty) minutes as needed (to achieve RASS & CPOT goal, breakthrough pain.).     HYDROmorphone  (DILAUDID ) 2 mg/mL SOLN Inject 1 mg into the vein every 30 (thirty) minutes as needed (to achieve RASS & CPOT goal, breakthrough pain.).      HYDROmorphone  50 mg in sodium chloride  0.9 % 45 mL Inject 0.5-4 mg/hr into the vein continuous.     insulin  aspart (NOVOLOG ) 100 UNIT/ML injection Inject 0-24 Units into the skin every 4 (four) hours.     meropenem  1 g in sodium chloride  0.9 % 100 mL Inject 1 g into the vein every 8 (eight) hours.     midazolam  (VERSED ) 1 mg/mL SOLN Inject 1-2 mg into the vein every hour as needed.     midazolam -sodium chloride  100-0.9 MG/100ML-% SOLN Inject 4-10 mg/hr into the vein continuous.     Morphine  Sulfate (MORPHINE , PF,) 2 MG/ML injection Inject 0.5-2 mLs (1-4 mg total) into the vein every hour as needed.     norepinephrine  (LEVOPHED ) 16-5 MG/250ML-% SOLN Inject 0-40 mcg/min into the vein continuous.     Nutritional Supplements (FEEDING SUPPLEMENT, PIVOT 1.5 CAL,) LIQD Place 1,000 mLs into feeding tube continuous.     ondansetron  (ZOFRAN ) 4 MG/2ML SOLN injection Inject 2 mLs (4 mg total) into the vein every 6 (six) hours as needed for nausea or vomiting.     oxyCODONE  (OXY IR/ROXICODONE ) 5 MG immediate release tablet Take 1-2 tablets (5-10 mg total) by mouth every 3 (three) hours as needed for severe pain (pain score 7-10).     pantoprazole  (PROTONIX ) 40 MG injection Inject 40 mg into the vein at bedtime.     polyethylene glycol (MIRALAX  / GLYCOLAX ) 17 g packet Place 17 g into feeding tube daily.     Protein (FEEDING SUPPLEMENT, PROSOURCE TF20,) liquid Place 60 mLs into feeding tube 2 (two) times daily.     vancomycin  (VANCOREADY) 1250 MG/250ML SOLN Inject 250 mLs (1,250 mg total) into the vein every 12 (twelve) hours.     Water  For Irrigation, Sterile (FREE WATER ) SOLN Place 200 mLs into feeding tube every 2 (two) hours.     XARELTO 20 MG TABS tablet Take 20 mg by mouth daily.     No current facility-administered medications for this visit.    Allergies  Allergen Reactions   Levaquin [Levofloxacin]     unknown     REVIEW OF SYSTEMS:  [X]  denotes positive finding, [ ]  denotes negative  finding Cardiac  Comments:  Chest pain or  chest pressure:    Shortness of breath upon exertion:    Short of breath when lying flat:    Irregular heart rhythm:        Vascular    Pain in calf, thigh, or hip brought on by ambulation:    Pain in feet at night that wakes you up from your sleep:     Blood clot in your veins:    Leg swelling:         Pulmonary    Oxygen at home:    Productive cough:     Wheezing:         Neurologic    Sudden weakness in arms or legs:     Sudden numbness in arms or legs:     Sudden onset of difficulty speaking or slurred speech:    Temporary loss of vision in one eye:     Problems with dizziness:         Gastrointestinal    Blood in stool:     Vomited blood:         Genitourinary    Burning when urinating:     Blood in urine:        Psychiatric    Major depression:         Hematologic    Bleeding problems:    Problems with blood clotting too easily:        Skin    Rashes or ulcers:        Constitutional    Fever or chills:      PHYSICAL EXAMINATION:  Vitals:   06/15/23 1354  BP: 113/79  Pulse: 91  Resp: 20  Temp: 98.3 F (36.8 C)  SpO2: 98%  Weight: 190 lb (86.2 kg)  Height: 5' 6 (1.676 m)    General:  WDWN in NAD; vital signs documented above Gait: Not observed HENT: WNL, normocephalic Pulmonary: normal non-labored breathing , without wheezing Cardiac: regular HR Abdomen: soft, NT, no masses Skin: without rashes Vascular Exam/Pulses:  Right Left  Radial 2+ (normal) 2+ (normal)  Ulnar    Femoral    Popliteal    DP 2+ (normal) 2+ (normal)  PT     Extremities: without ischemic changes, without Gangrene , without cellulitis; without open wounds;  Musculoskeletal: no muscle wasting or atrophy  Neurologic: A&O X 3;  No focal weakness or paresthesias are detected Psychiatric:  The pt has Normal affect.   Non-Invasive Vascular Imaging:   ABI Findings:  +---------+------------------+-----+---------+--------+   Right   Rt Pressure (mmHg)IndexWaveform Comment   +---------+------------------+-----+---------+--------+  Brachial 112                                       +---------+------------------+-----+---------+--------+  PTA     127               1.13 triphasic          +---------+------------------+-----+---------+--------+  DP      126               1.12 triphasic          +---------+------------------+-----+---------+--------+  Great Toe103               0.92 Normal             +---------+------------------+-----+---------+--------+   +---------+------------------+-----+---------+-------+  Left    Lt Pressure (mmHg)IndexWaveform Comment  +---------+------------------+-----+---------+-------+  Brachial 108                                      +---------+------------------+-----+---------+-------+  PTA     125               1.12 triphasic         +---------+------------------+-----+---------+-------+  DP      120               1.07 triphasic         +---------+------------------+-----+---------+-------+  Great Toe102               0.91 Normal            +---------+------------------+-----+---------+-------+   +-------+-----------+-----------+------------+------------+  ABI/TBIToday's ABIToday's TBIPrevious ABIPrevious TBI  +-------+-----------+-----------+------------+------------+  Right 1.13       0.92                                 +-------+-----------+-----------+------------+------------+  Left  1.12       0.91                                 +-------+-----------+-----------+------------+------------+      ASSESSMENT/PLAN: Isamar Wellbrock is a 58 y.o. male presenting with tissue loss at the left second toe status post cardiac surgery with acute biventricular heart failure requiring ECMO.  The wound on the lower extremity is likely due to atheroembolic event versus pressor induced ischemia that has not  healed. Regardless, he has a palpable pulse in both the dorsalis pedis and posterior tibial arteries.  His ABI was reviewed and found to be normal. Tyson would benefit from the left second toe partial amputation.  He should have no issues healing this with his current perfusion.  I plan to refer him to my colleague Dr. Juliene Medicine at [facility] for evaluation.  No significant carotid stenosis identified when I assessed previous studies.  Sandip can follow-up with me as needed at this time.   Fonda FORBES Rim, MD [facility] [phone]

## 2023-06-16 ENCOUNTER — Other Ambulatory Visit (HOSPITAL_COMMUNITY): Payer: Commercial Managed Care - HMO

## 2023-06-20 ENCOUNTER — Ambulatory Visit: Payer: Commercial Managed Care - HMO | Attending: Family Medicine

## 2023-06-20 DIAGNOSIS — R498 Other voice and resonance disorders: Secondary | ICD-10-CM | POA: Insufficient documentation

## 2023-06-20 DIAGNOSIS — R1312 Dysphagia, oropharyngeal phase: Secondary | ICD-10-CM | POA: Diagnosis present

## 2023-06-20 NOTE — Therapy (Signed)
 OUTPATIENT SPEECH LANGUAGE PATHOLOGY SWALLOW EVALUATION   Patient Name: Johnathan Anderson MRN: [id number] DOB:[birth year], 58 y.o., male Today's Date: 06/20/2023  PCP: Leonel Cole MD REFERRING PROVIDER: Leonel Cole, MD  END OF SESSION:  End of Session - 06/20/23 1216     Visit Number 1    Number of Visits 25    Date for SLP Re-Evaluation 09/12/23    Authorization Type [employer]    SLP Start Time 1220    SLP Stop Time  1310    SLP Time Calculation (min) 50 min    Activity Tolerance Patient tolerated treatment well             Past Medical History:  Diagnosis Date   Aortic aneurysm (HCC)    Arthritis    Left Knee   Cancer (HCC) 2022   Skin Cancer   GERD (gastroesophageal reflux disease)    Headache    Migraines   Heart murmur    Hyperlipidemia    Hypertension    Peripheral vascular disease (HCC)    Thoracic Aortic Aneurysm   Vertigo    Past Surgical History:  Procedure Laterality Date   AORTIC VALVE REPLACEMENT N/A 04/26/2023   Procedure: AORTIC VALVE REPLACEMENT (AVR) USING 23 MM KONECT RESILIA AORTIC VALVED CONDUIT;  Surgeon: Maryjane Mt, MD;  Location: [facility] OR;  Service: Open Heart Surgery;  Laterality: N/A;   APPENDECTOMY     1990s   CANNULATION FOR ECMO (EXTRACORPOREAL MEMBRANE OXYGENATION) N/A 04/26/2023   Procedure: CENTRAL CANNULATION FOR ECMO (EXTRACORPOREAL MEMBRANE OXYGENATION);  Surgeon: Maryjane Mt, MD;  Location: [facility] OR;  Service: Open Heart Surgery;  Laterality: N/A;   COLONOSCOPY WITH ESOPHAGOGASTRODUODENOSCOPY (EGD)     2020s   CORONARY ARTERY BYPASS GRAFT N/A 04/26/2023   Procedure: CORONARY ARTERY BYPASS GRAFTING (CABG) x 2 USING RIGHT AND LEFT GREATER SAPHENOUS VEIN HARVESTED OPEN;  Surgeon: Maryjane Mt, MD;  Location: [facility] OR;  Service: Open Heart Surgery;  Laterality: N/A;   EXPLORATION POST OPERATIVE OPEN HEART N/A 04/26/2023   Procedure: EXPLORATION POST OPERATIVE OPEN HEART;  Surgeon: Maryjane Mt, MD;  Location: [facility] OR;  Service: Open Heart  Surgery;  Laterality: N/A;   EXPLORATION POST OPERATIVE OPEN HEART N/A 04/27/2023   Procedure: EXPLORATION POST OPERATIVE OPEN HEART;  Surgeon: Maryjane Mt, MD;  Location: [facility] OR;  Service: Open Heart Surgery;  Laterality: N/A;   EYE SURGERY Right    eye tissue removed   MOUTH SURGERY  2022   Skin Cancer Removal   REPLACEMENT ASCENDING AORTA N/A 04/26/2023   Procedure: REPLACEMENT ASCENDING AORTA USING 23 MM KONECT RESILIA AORTIC VALVED CONDUIT;  Surgeon: Maryjane Mt, MD;  Location: [facility] OR;  Service: Open Heart Surgery;  Laterality: N/A;   RIGHT/LEFT HEART CATH AND CORONARY ANGIOGRAPHY N/A 12/28/2022   Procedure: RIGHT/LEFT HEART CATH AND CORONARY ANGIOGRAPHY;  Surgeon: Jordan, Peter M, MD;  Location: [facility] INVASIVE CV LAB;  Service: Cardiovascular;  Laterality: N/A;   STERIOD INJECTION  09/16/2011   Procedure: MINOR STEROID INJECTION;  Surgeon: Lacinda Ozell Mans, MD;  Location: [facility];  Service: Orthopedics;  Laterality: Left;  Left Lumbar Five-Sacral One Transforaminal Epidural Steroid Injection   STERNAL WOUND DEBRIDEMENT N/A 05/01/2023   Procedure: MEDIAL STERNAL WOUND DEBRIDEMENT;  Surgeon: Obadiah Coy, MD;  Location: [facility] OR;  Service: Thoracic;  Laterality: N/A;   TEE WITHOUT CARDIOVERSION N/A 11/05/2020   Procedure: TRANSESOPHAGEAL ECHOCARDIOGRAM (TEE);  Surgeon: Santo Stanly LABOR, MD;  Location: [facility] ENDOSCOPY;  Service: Cardiovascular;  Laterality: N/A;  TEE WITHOUT CARDIOVERSION N/A 04/26/2023   Procedure: TRANSESOPHAGEAL ECHOCARDIOGRAM;  Surgeon: Maryjane Mt, MD;  Location: [facility] OR;  Service: Open Heart Surgery;  Laterality: N/A;   TEE WITHOUT CARDIOVERSION N/A 04/27/2023   Procedure: TRANSESOPHAGEAL ECHOCARDIOGRAM (TEE);  Surgeon: Maryjane Mt, MD;  Location: [facility] OR;  Service: Open Heart Surgery;  Laterality: N/A;   TEE WITHOUT CARDIOVERSION N/A 05/01/2023   Procedure: TRANSESOPHAGEAL ECHOCARDIOGRAM (TEE);  Surgeon: Obadiah Coy, MD;  Location: [facility] OR;   Service: Thoracic;  Laterality: N/A;   Patient Active Problem List   Diagnosis Date Noted   Aortic valve replaced 04/26/2023   S/P AVR (aortic valve replacement) 04/26/2023   Chronic migraine without aura without status migrainosus, not intractable 09/14/2022   Essential hypertension 07/07/2022   Squamous cell carcinoma, 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   Migraine with aura and without status migrainosus, not intractable 02/19/2021   Thoracic aortic aneurysm without rupture (HCC) 10/05/2020   Severe aortic stenosis 10/05/2020   Bicuspid aortic valve 10/05/2020   Family history of aortic dissection 10/05/2020   Periodontitis 10/05/2020   Memory loss 02/24/2014   Chronic back pain 02/24/2014    ONSET DATE: 06/19/23 (referral date)   REFERRING DIAG: R13.10 (ICD-10-CM) - Dysphagia, unspecified  THERAPY DIAG: Dysphagia, oropharyngeal phase  Other voice and resonance disorders  Rationale for Evaluation and Treatment: Rehabilitation  SUBJECTIVE:   SUBJECTIVE STATEMENT: The only thing that is slower is... I feel something in my throat. It's always there  Pt accompanied by: significant other and family member  PERTINENT HISTORY: Johnathan Anderson is a 58 y.o. male with bicuspid AoV with stenosis and ascending aortic aneurysm who is s/p [employer], CABG x 2 (SVG-RCA, SVG-LAD) on 11/20 at OSH c/b cardiac arrest and bleeding requiring return to OR for exploration, MTP and cannulation of central VA ECMO cannulation 11/21. He was transferred to [facility] on 11/26 for evaluation of advanced heart failure therapies and went to OR on 11/27 for VA ECMO decannulation and IABP placement.   The hospital course was significant for the following:. Hypoxic respiratory failure: Required mechanical ventilation, extubated 12/10. O2 was weaned while inpatient. Bronchoscopies were performed prn - last on 12/9.  Dysphagia: Speech and nutrition were consulted. FEEs on 12/20 cleared for soft and bite sized. DHT was placed for enteral  nutrition and removed on 12/23 .Diet at time of discharge reg/thins.   PAIN: Are you having pain? Yes: NPRS scale: 6/10 Pain location: back, rib cage  Aggravating factors: Coughing, laughing Relieving factors: only when coughing  FALLS: Has patient fallen in last 6 months?  No  LIVING ENVIRONMENT: Lives with: lives with their family Lives in: House/apartment  PLOF:  Level of assistance: Independent with ADLs, Independent with IADLs Employment: On disability since 2022  PATIENT GOALS: Improve swallow and voice  OBJECTIVE:  Note: Objective measures were completed at Evaluation unless otherwise noted. OBJECTIVE:   INSTRUMENTAL SWALLOW STUDY FINDINGS FEES 05/26/23 - no results available  COGNITION: Overall cognitive status: Within functional limits for tasks assessed  SUBJECTIVE DYSPHAGIA REPORTS:  Date of onset: November 2024 Reported symptoms: coughing with solids, globus sensation, xerostomia, burping, hoarseness, weak voice, and we vocal quality  Current diet: regular, Dysphagia 3 (mechanical soft), and thin liquids  Co-morbid voice changes: Yes - reports new onset hoarseness following intubation   FACTORS WHICH MAY INCREASE RISK OF ADVERSE EVENT IN PRESENCE OF ASPIRATION:  General health: poor general health  Risk factors: weak cough and GERD or other GI disease    ORAL MOTOR EXAMINATION: Overall status: [employer]  Comments: Reduced labial ROM d/t lower lip CA removal   CLINICAL SWALLOW ASSESSMENT:   Dentition: adequate natural dentition Vocal quality at baseline: hoarse, rough, strained, low vocal intensity, and vocal fatigue Patient directly observed with POs: Yes: regular and thin liquids  Feeding: able to feed self Liquids provided by: cup [facility] Swallow Protocol: Fail: pt unable to complete full amount d/t globus sensation  Oral phase signs and symptoms: prolonged mastication Pharyngeal phase signs and symptoms: suspected delayed swallow initiation  PATIENT REPORTED  OUTCOME MEASURES (PROM): V-RQOL: 17, EAT-10: 8, RSI: 25 (suggestive of LPR > 13), and Pill-5 :12 (reported prior pill dysphagia)   Question Patient's Response  My swallowing problem has caused me to lose weight 2  2.  My swallowing problem interferes with my ability to go out to meals 0  3.  Swallowing liquids takes extra effort 1  4.  Swallowing solids takes extra effort 1  5.  Swallowing pills takes extra effort 3  6.  Swallowing is painful 0  7.  The pleasure of eating is affected by my swallowing 0  8.  When I swallow food sticks in my throat 0  9.  I cough when I eat 1  10.  Swallowing is stressful  0  0= No problem 4= Severe problem                                                                                    TREATMENT DATE:  06-20-23: Provided recommendations for MBSS and ENT referral d/t complaints on ongoing dysphagia, globus sensation, persistent phlegm, and usual throat clearing. Pt and family verbalized understanding and agreement.    PATIENT EDUCATION: Education details: see above  Person educated: Patient and Spouse Education method: Explanation Education comprehension: verbalized understanding and needs further education   ASSESSMENT:  CLINICAL IMPRESSION: Patient is a 58 y.o. M who was seen today for dysphagia following medical complications of cardiac surgeries. Intubated x2 with prolonged NPO status. Discharged from hospital on regular textures/thin liquids. Currently consuming dysphagia 2/3 per pt preference and avoiding hard, dry, and tough regular textures. Reported persistent globus sensation and copious amount of phlegm. Reported hx of GERD but is not taking PPI currently. Presented with usual throat clearing. OME was largely unremarkable. Reduced labial ROM d/t surgery to remove CA on lower lip. Clinical swallow assessment without s/sx of aspiration. C/o globus sensation following consecutive sips when [facility] attempted. Reported chronic pill dysphagia with  preference for crushing meds prior to recent medical events. Presents with mild hoarseness and roughness. Endorsed drastic change in voice. Impaired breath support noted (sustained phonation max 4 seconds) impacting clarity and intensity as conversation progresses. Recommend ENT referral and MBSS to further evaluate larynx and swallow function. Update POC pending those results. Pt would benefit from skilled ST intervention to optimize swallow function and improve voicing to aid return to baseline.   OBJECTIVE IMPAIRMENTS: include voice disorder and dysphagia. These impairments are limiting patient from effectively communicating at home and in community and safety when swallowing. Factors affecting potential to achieve goals and functional outcome are co-morbidities and previous level of function. Patient will benefit from skilled SLP services to address above  impairments and improve overall function.  REHAB POTENTIAL: Good   GOALS: Goals reviewed with patient? Yes  SHORT TERM GOALS: Target date: 07/18/2023  Pt will complete MBSS to evaluate current swallow function Baseline: Goal status: INITIAL  2.  Pt will perform recommended swallow exercises with rare min A x 2 sessions Baseline:  Goal status: INITIAL  3.  Pt will utilize recommended swallow precautions with rare min A x2 sessions  Baseline:  Goal status: INITIAL  4.  Pt will reduce throat clearing by 50% by STG date Baseline: > 7 Goal status: INITIAL  5.  Pt will complete recommended voice exercises with occasional min A x2 sessions (pending ENT evaluation) Baseline:  Goal status: INITIAL   LONG TERM GOALS: Target date: 09/12/2023  Pt will carryover recommended swallow exercises/precautions > 1 week with mod I   Baseline:  Goal status: INITIAL  2.  Pt will demonstrate clear voicing on structured tasks given rare min A x2 sessions  Baseline:  Goal status: INITIAL  3.  Pt will carryover clear voicing in unstructured  conversations given rare min A x2 sessions  Baseline:  Goal status: INITIAL  4.  Pt will report improved voice and swallow functioning via PROMS (see above) by 2 pts on each  Baseline:  Goal status: INITIAL   PLAN:  SLP FREQUENCY: 2x/week  SLP DURATION: 12 weeks  PLANNED INTERVENTIONS: Aspiration precaution training, Pharyngeal strengthening exercises, Diet toleration management , Trials of upgraded texture/liquids, Internal/external aids, Oral motor exercises, Functional tasks, Multimodal communication approach, SLP instruction and feedback, Compensatory strategies, Patient/family education, [phone] Treatment of speech (30 or 45 min) , and [id number] Treatment of swallowing function    Comer LILLETTE Louder, CCC-SLP 06/20/2023, 2:15 PM

## 2023-06-21 ENCOUNTER — Telehealth: Payer: Self-pay | Admitting: Otolaryngology

## 2023-06-21 NOTE — Telephone Encounter (Signed)
   Adding for documentation  Pt has been called and booked on Dr Soldatovas schedule

## 2023-06-22 ENCOUNTER — Other Ambulatory Visit (HOSPITAL_COMMUNITY): Payer: Self-pay | Admitting: Family Medicine

## 2023-06-22 DIAGNOSIS — R1310 Dysphagia, unspecified: Secondary | ICD-10-CM

## 2023-06-28 ENCOUNTER — Encounter: Payer: Self-pay | Admitting: Internal Medicine

## 2023-06-30 ENCOUNTER — Telehealth: Payer: Self-pay | Admitting: Internal Medicine

## 2023-06-30 NOTE — Telephone Encounter (Signed)
Caller (Mallory) is following-up on patient's order for cardiac rehab.

## 2023-06-30 NOTE — Telephone Encounter (Signed)
Returning call to a nurse.

## 2023-06-30 NOTE — Telephone Encounter (Signed)
Called X2 mailbox full unable to leave a message.

## 2023-06-30 NOTE — Telephone Encounter (Signed)
Called spoke with Mallory she reports pt was calling the PCP to set up Cardiac Rehab instead of Cardiology.   Advised her order has been placed and [facility] will reach out to set up visits. No further concerns at this time.

## 2023-07-03 ENCOUNTER — Telehealth (HOSPITAL_COMMUNITY): Payer: Self-pay

## 2023-07-03 NOTE — Telephone Encounter (Signed)
Pt insurance is active and benefits verified through [place]. Co-pay $0.00, DED $3,500.00/$570.19 met, out of pocket $9,100.00/$570.19 met, co-insurance 50%. No pre-authorization required. Lexy M./[employer], 07/03/23 @ 1:55PM, VHQ#[id number]   How many CR sessions are covered? TCR 30 visits only Is this a lifetime maximum or an annual maximum? Annual Has the member used any of these services to date? No Is there a time limit (weeks/months) on start of program and/or program completion? No

## 2023-07-03 NOTE — Telephone Encounter (Signed)
Called patient to see if he was interested in participating in the Cardiac Rehab Program. Patient stated yes. Patient will come in for orientation on 07/11/23 @ 10:30AM and will attend the 1:45PM exercise class.   Pensions consultant.

## 2023-07-03 NOTE — Telephone Encounter (Signed)
Called and spoke with pt in regards to his insurnace benefits. Pt stated he will be changing from [employer] to Medicare A/B on 07/08/2023. Medicare#[id number].

## 2023-07-06 ENCOUNTER — Ambulatory Visit (HOSPITAL_COMMUNITY)
Admission: RE | Admit: 2023-07-06 | Discharge: 2023-07-06 | Disposition: A | Discharge status: Home / Self Care | Payer: Commercial Managed Care - HMO | Source: Ambulatory Visit | Attending: Family Medicine | Admitting: Family Medicine

## 2023-07-06 DIAGNOSIS — K219 Gastro-esophageal reflux disease without esophagitis: Secondary | ICD-10-CM | POA: Insufficient documentation

## 2023-07-06 DIAGNOSIS — R1312 Dysphagia, oropharyngeal phase: Secondary | ICD-10-CM | POA: Diagnosis not present

## 2023-07-06 DIAGNOSIS — R498 Other voice and resonance disorders: Secondary | ICD-10-CM

## 2023-07-06 DIAGNOSIS — R1310 Dysphagia, unspecified: Secondary | ICD-10-CM

## 2023-07-06 NOTE — Progress Notes (Signed)
Modified Barium Swallow Study  Patient Details  Name: Johnathan Anderson MRN: [id number] Date of Birth: [birth year]  Today's Date: 07/06/2023  Modified Barium Swallow completed.  Full report located under Chart Review in the Imaging Section.  History of Present Illness Pt presenting with dysphagia following medical complications of cardiac surgeries. Intubated x2 with prolonged NPO status. Discharged from hospital on regular textures/thin liquids. Currently consuming dysphagia 2/3 per pt preference. Reported persistent globus sensation with and without PO. Reported hx of GERD but reports he "doesn't have it anymore" and is not taking PPI. Reported chronic pill dysphagia with preference for crushing meds. Presenting with habitual throat clearing prior to PO administration, reporting is chronic.   Clinical Impression Mr. Ginyard was seen for Modified Barium Swallow Study to assess swallow physiology and safety. He presents with functional oropharyngeal swallow and unremarkable sweep of esophagus. He tolerated thin, mildly thick, moderately thick liquids, puree, regular solids, and small pill with thin liquids with no overt challenges noted. Oral phase demonstrating good lingual control during directed bolus hold, brisk A-P transit of boluses into pharynx, with pharyngeal swallow triggered as bolus passed base of tongue. Occasional trace amount of spillage into pyriorms with sequential sips of thin liquids. Pharyngeal phase c/b intact hyolaryngeal elevation, adequate laryngeal vestibule closure, full epiglottic inversion, and WNL pharyngeal stripping wave. Airway protection intact throughout all trials with no airway invasion demonstrated. Pt reports will take small pills whole with water, simulated this date with  barium tablet. Safe and efficient passage through pharynx, timely and complete clearance through esophagus into stomach. No complaints of globus during today's evaluation. X1 throat clear evidenced  with no contrast noted in airway or residuals in pharynx. Reviewed recommendation for evaluation with ENT d/t suspected laryngopharyngeal reflux. Suggest ongoing OP ST for reducing vocally abusive behaviors and optimizing vocal quality as pt reporting change in baseline. No diet texture modifications are warranted based on today's examination. Factors that may increase risk of adverse event in presence of aspiration Rubye Oaks & Clearance Coots 2021):  (none noted)  Swallow Evaluation Recommendations Recommendations: PO diet PO Diet Recommendation: Regular;Thin liquids (Level 0) Medication Administration:  (as tolerated per pt preference) Oral care recommendations: Oral care BID (2x/day) Recommended consults: Consider ENT consultation  Graduate clinician Roylene Reason present for this session and participated in administration of therapeutic interventions under my supervision.   Maia Breslow 07/06/2023,12:51 PM

## 2023-07-10 ENCOUNTER — Telehealth (HOSPITAL_COMMUNITY): Payer: Self-pay

## 2023-07-10 ENCOUNTER — Ambulatory Visit: Payer: Medicare Other | Attending: Family Medicine

## 2023-07-10 DIAGNOSIS — R498 Other voice and resonance disorders: Secondary | ICD-10-CM | POA: Insufficient documentation

## 2023-07-10 DIAGNOSIS — R1310 Dysphagia, unspecified: Secondary | ICD-10-CM | POA: Diagnosis not present

## 2023-07-10 NOTE — Patient Instructions (Signed)
 Semi-occluded vocal tract exercises (SOVTE)  These allow your vocal folds to vibrate without excess tension and promotes high placement of the voice  Use SOVTE as a warm up before prolonged speaking and vocal exercises  Watch Vocal Straw Exercises with Lenox Ahr on YouTube: [email or web address]  Make sure your lips are rounded and sealed around straw   Exercises: (2-3x each exercise)  Blow air through straw Hum through straw  Hum (low to high pitch) through straw Hum (high to low pitch) through straw   Hum "[employer]" through [employer] "happy birthday" through [employer] air through straw into water  Hum through straw into water  Hum (low to high pitch) through straw into water  Hum (high to low pitch) through straw into water   Hum "[employer]" through straw into water   Hum "happy birthday" through straw into water   A goal would be 2-3 minutes several times a day and prior to vocal exercises  As always, use good belly breathing while completing SOVTE

## 2023-07-10 NOTE — Therapy (Signed)
OUTPATIENT SPEECH LANGUAGE PATHOLOGY TREATMENT   Patient Name: Johnathan Anderson MRN: [id number] DOB:1965/11/01, 58 y.o., male Today's Date: 07/10/2023  PCP: Irven Coe MD REFERRING PROVIDER: Irven Coe, MD  END OF SESSION:  End of Session - 07/10/23 1617     Visit Number 2    Number of Visits 25    Date for SLP Re-Evaluation 09/12/23    SLP Start Time 0205    SLP Stop Time  0245    SLP Time Calculation (min) 40 min    Activity Tolerance Patient tolerated treatment well              Past Medical History:  Diagnosis Date   Aortic aneurysm (HCC)    Arthritis    Left Knee   Cancer (HCC) 2022   Skin Cancer   GERD (gastroesophageal reflux disease)    Headache    Migraines   Heart murmur    Hyperlipidemia    Hypertension    Peripheral vascular disease (HCC)    Thoracic Aortic Aneurysm   Vertigo    Past Surgical History:  Procedure Laterality Date   AORTIC VALVE REPLACEMENT N/A 04/26/2023   Procedure: AORTIC VALVE REPLACEMENT (AVR) USING 23 MM KONECT RESILIA AORTIC VALVED CONDUIT;  Surgeon: Eugenio Hoes, MD;  Location: [facility] OR;  Service: Open Heart Surgery;  Laterality: N/A;   APPENDECTOMY     1990s   CANNULATION FOR ECMO (EXTRACORPOREAL MEMBRANE OXYGENATION) N/A 04/26/2023   Procedure: CENTRAL CANNULATION FOR ECMO (EXTRACORPOREAL MEMBRANE OXYGENATION);  Surgeon: Eugenio Hoes, MD;  Location: [facility] OR;  Service: Open Heart Surgery;  Laterality: N/A;   COLONOSCOPY WITH ESOPHAGOGASTRODUODENOSCOPY (EGD)     2020s   CORONARY ARTERY BYPASS GRAFT N/A 04/26/2023   Procedure: CORONARY ARTERY BYPASS GRAFTING (CABG) x 2 USING RIGHT AND LEFT GREATER SAPHENOUS VEIN HARVESTED OPEN;  Surgeon: Eugenio Hoes, MD;  Location: [facility] OR;  Service: Open Heart Surgery;  Laterality: N/A;   EXPLORATION POST OPERATIVE OPEN HEART N/A 04/26/2023   Procedure: EXPLORATION POST OPERATIVE OPEN HEART;  Surgeon: Eugenio Hoes, MD;  Location: [facility] OR;  Service: Open Heart Surgery;  Laterality: N/A;    EXPLORATION POST OPERATIVE OPEN HEART N/A 04/27/2023   Procedure: EXPLORATION POST OPERATIVE OPEN HEART;  Surgeon: Eugenio Hoes, MD;  Location: [facility] OR;  Service: Open Heart Surgery;  Laterality: N/A;   EYE SURGERY Right    eye tissue removed   MOUTH SURGERY  2022   Skin Cancer Removal   REPLACEMENT ASCENDING AORTA N/A 04/26/2023   Procedure: REPLACEMENT ASCENDING AORTA USING 23 MM KONECT RESILIA AORTIC VALVED CONDUIT;  Surgeon: Eugenio Hoes, MD;  Location: [facility] OR;  Service: Open Heart Surgery;  Laterality: N/A;   RIGHT/LEFT HEART CATH AND CORONARY ANGIOGRAPHY N/A 12/28/2022   Procedure: RIGHT/LEFT HEART CATH AND CORONARY ANGIOGRAPHY;  Surgeon: Swazil[place], Peter M, MD;  Location: [facility] INVASIVE CV LAB;  Service: Cardiovascular;  Laterality: N/A;   STERIOD INJECTION  09/16/2011   Procedure: MINOR STEROID INJECTION;  Surgeon: Mat Carne, MD;  Location: [facility];  Service: Orthopedics;  Laterality: Left;  Left Lumbar Five-Sacral One Transforaminal Epidural Steroid Injection   STERNAL WOUND DEBRIDEMENT N/A 05/01/2023   Procedure: MEDIAL STERNAL WOUND DEBRIDEMENT;  Surgeon: Lovett Sox, MD;  Location: [facility] OR;  Service: Thoracic;  Laterality: N/A;   TEE WITHOUT CARDIOVERSION N/A 11/05/2020   Procedure: TRANSESOPHAGEAL ECHOCARDIOGRAM (TEE);  Surgeon: Christell Constant, MD;  Location: [facility] ENDOSCOPY;  Service: Cardiovascular;  Laterality: N/A;   TEE WITHOUT CARDIOVERSION N/A 04/26/2023  Procedure: TRANSESOPHAGEAL ECHOCARDIOGRAM;  Surgeon: Eugenio Hoes, MD;  Location: [facility] OR;  Service: Open Heart Surgery;  Laterality: N/A;   TEE WITHOUT CARDIOVERSION N/A 04/27/2023   Procedure: TRANSESOPHAGEAL ECHOCARDIOGRAM (TEE);  Surgeon: Eugenio Hoes, MD;  Location: [facility] OR;  Service: Open Heart Surgery;  Laterality: N/A;   TEE WITHOUT CARDIOVERSION N/A 05/01/2023   Procedure: TRANSESOPHAGEAL ECHOCARDIOGRAM (TEE);  Surgeon: Lovett Sox, MD;  Location: [facility] OR;  Service: Thoracic;   Laterality: N/A;   Patient Active Problem List   Diagnosis Date Noted   Aortic valve replaced 04/26/2023   S/P AVR (aortic valve replacement) 04/26/2023   Chronic migraine without aura without status migrainosus, not intractable 09/14/2022   Essential hypertension 07/07/2022   Squamous cell carcinoma, 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   Migraine with aura and without status migrainosus, not intractable 02/19/2021   Thoracic aortic aneurysm without rupture (HCC) 10/05/2020   Severe aortic stenosis 10/05/2020   Bicuspid aortic valve 10/05/2020   Family history of aortic dissection 10/05/2020   Periodontitis 10/05/2020   Memory loss 02/24/2014   Chronic back pain 02/24/2014    ONSET DATE: 06/19/23 (referral date)   REFERRING DIAG: R13.10 (ICD-10-CM) - Dysphagia, unspecified  THERAPY DIAG: Other voice and resonance disorders  Rationale for Evaluation and Treatment: Rehabilitation  SUBJECTIVE:   SUBJECTIVE STATEMENT: "I feel something in my throat. It's always there"  Pt accompanied by: self  PERTINENT HISTORY: "Johnathan Anderson is a 58 y.o. male with bicuspid AoV with stenosis and ascending aortic aneurysm who is s/p [employer], CABG x 2 (SVG-RCA, SVG-LAD) on 11/20 at OSH c/b cardiac arrest and bleeding requiring return to OR for exploration, MTP and cannulation of central VA ECMO cannulation 11/21. He was transferred to [facility] on 11/26 for evaluation of advanced heart failure therapies and went to OR on 11/27 for VA ECMO decannulation and IABP placement.   The hospital course was significant for the following:. Hypoxic respiratory failure: Required mechanical ventilation, extubated 12/10. O2 was weaned while inpatient. Bronchoscopies were performed prn - last on 12/9.  Dysphagia: Speech and nutrition were consulted. FEEs on 12/20 cleared for soft and bite sized. DHT was placed for enteral nutrition and removed on 12/23 .Diet at time of discharge reg/thins."   PAIN: Are you having pain? Yes: NPRS scale:  6/10 Pain location: back, rib cage  Aggravating factors: Coughing, laughing Relieving factors: only when coughing  FALLS: Has patient fallen in last 6 months?  No  LIVING ENVIRONMENT: Lives with: lives with their family Lives in: House/apartment  PLOF:  Level of assistance: Independent with ADLs, Independent with IADLs Employment: On disability since 2022  PATIENT GOALS: Improve swallow and voice  OBJECTIVE:  Note: Objective measures were completed at Evaluation unless otherwise noted. OBJECTIVE:                                                                                    TREATMENT DATE:  07-10-23: ST reviewed MBSS results with patient and provided education on his [facility] swallow function. Utilized MBSS as visual support. No further swallowing intervention warranted per MBSS results. Provided pt education on potential causes of globus sensation including reflux given pt's history with GERD and potential causes and  symptoms of vocal cord dysfunction. Pt informed ST he frequently coughs, clears his throat. ST educated on vocal hygiene practices to prevent irritation caused by these behaviors. ST then initiated semi-occluded vocal tract exercises (SOVTE) targeting reduced muscle tension and strong breath support. Pt participated effectively and completed exercises with mod-A and a clinician model. ST plans to continue implementing SOVTE in future sessions and review strategies for cough suppression/throat clearing.  06-20-23: Provided recommendations for MBSS and ENT referral d/t complaints on ongoing dysphagia, globus sensation, persistent phlegm, and usual throat clearing. Pt and family verbalized understanding and agreement.    PATIENT EDUCATION: Education details: see above  Person educated: Patient and Spouse Education method: Explanation Education comprehension: verbalized understanding and needs further education   ASSESSMENT:  CLINICAL IMPRESSION: Patient is a 58 y.o. M who  was seen today for dysphagia and concerns regarding persistent globus sensation following medical complications of cardiac surgeries. Intubated x2 with prolonged NPO status. Discharged from hospital on regular textures/thin liquids. MBSS on 07/06/23 revealed functional oropharyngeal swallow and unremarkable sweep of esophagus. Pt tolerated thin, mildly thick, moderately thick liquids, puree, regular solids, and small pill with thin liquids with no overt challenges noted. Reported hx of GERD but is not taking PPI currently. Presented with usual throat clearing. Presents with mild hoarseness and roughness. Endorsed drastic change in voice and impaired breath support impacting clarity and intensity as conversation progresses. Pt has scheduled visit to ENT to further evaluate larynx and swallow function. Update POC pending those results. Pt would benefit from skilled ST intervention to optimize vocal hygiene and improve voicing to aid return to baseline.   OBJECTIVE IMPAIRMENTS: include voice disorder and dysphagia. These impairments are limiting patient from effectively communicating at home and in community and safety when swallowing. Factors affecting potential to achieve goals and functional outcome are co-morbidities and previous level of function. Patient will benefit from skilled SLP services to address above impairments and improve overall function.  REHAB POTENTIAL: Good   GOALS: Goals reviewed with patient? Yes  SHORT TERM GOALS: Target date: 07/18/2023  Pt will complete MBSS to evaluate current swallow function Baseline: Goal status: MET  2.  Pt will perform recommended swallow exercises with rare min A x 2 sessions Baseline:  Goal status: DEFERRED - WNL swallow  3.  Pt will utilize recommended swallow precautions with rare min A x2 sessions  Baseline:  Goal status: DEFERRED - WNL swallow  4.  Pt will reduce throat clearing by 50% by STG date Baseline: > 7 Goal status: IN  PROGRESS  5.  Pt will complete recommended voice exercises with occasional min A x2 sessions (pending ENT evaluation) Baseline:  Goal status: IN PROGRESS   LONG TERM GOALS: Target date: 09/12/2023  Pt will carryover recommended swallow exercises/precautions > 1 week with mod I   Baseline:  Goal status: DEFERRED - WNL swallow  2.  Pt will demonstrate clear voicing on structured tasks given rare min A x2 sessions  Baseline:  Goal status: IN PROGRESS  3.  Pt will carryover clear voicing in unstructured conversations given rare min A x2 sessions  Baseline:  Goal status: IN PROGRESS  4.  Pt will report improved voice and swallow functioning via PROMS (see above) by 2 pts on each  Baseline:  Goal status: IN PROGRESS   PLAN:  SLP FREQUENCY: 2x/week  SLP DURATION: 12 weeks  PLANNED INTERVENTIONS: Aspiration precaution training, Pharyngeal strengthening exercises, Diet toleration management , Trials of upgraded texture/liquids, Internal/external aids, Oral motor  exercises, Functional tasks, Multimodal communication approach, SLP instruction and feedback, Compensatory strategies, Patient/family education, [phone] Treatment of speech (30 or 45 min) , and [id number] Treatment of swallowing function    Gracy Racer, CCC-SLP 07/10/2023, 4:39 PM

## 2023-07-10 NOTE — Telephone Encounter (Signed)
Pt insurance is active and benefits verified through Medicare A/B. Co-pay $0.00, DED $257.00/$0.00 met, out of pocket $0.00/$0.00 met, co-insurance 20%. No pre-authorization required. Passport, 07/10/23 @ 10:40AM, REF#[id number]   How many CR sessions are covered? (36 visits for TCR, 72 visits for ICR)72 Is this a lifetime maximum or an annual maximum? Lifetime Has the member used any of these services to date? No Is there a time limit (weeks/months) on start of program and/or program completion? No

## 2023-07-11 ENCOUNTER — Encounter (HOSPITAL_COMMUNITY)
Admission: RE | Admit: 2023-07-11 | Discharge: 2023-07-11 | Disposition: A | Discharge status: Home / Self Care | Payer: Commercial Managed Care - HMO | Source: Ambulatory Visit | Attending: Internal Medicine | Admitting: Internal Medicine

## 2023-07-11 VITALS — BP 104/70 | Ht 67.0 in | Wt 188.5 lb

## 2023-07-11 DIAGNOSIS — Z952 Presence of prosthetic heart valve: Secondary | ICD-10-CM | POA: Insufficient documentation

## 2023-07-11 DIAGNOSIS — Z951 Presence of aortocoronary bypass graft: Secondary | ICD-10-CM | POA: Insufficient documentation

## 2023-07-11 NOTE — Progress Notes (Signed)
Cardiac Rehab Medication Review   Does the patient  feel that his/her medications are working for him/her?  yes  Has the patient been experiencing any side effects to the medications prescribed?  yes  Does the patient measure his/her own blood pressure or blood glucose at home?  yes   Does the patient have any problems obtaining medications due to transportation or finances?   no  Understanding of regimen: excellent Understanding of indications: excellent Potential of compliance: excellent    Comments: Pt checks blood pressure 2-3 x /week at home. Pt voices urination form the diuretic is a side effect, but understands that this is what the medication is supposed to do.     Lorin Picket 07/11/2023 12:03 PM

## 2023-07-11 NOTE — Progress Notes (Signed)
 Cardiac Individual Treatment Plan  Patient Details  Name: Johnathan Anderson MRN: [id number] Date of Birth: 1965/08/01 Referring Provider:    Initial Encounter Date:  Flowsheet Row INTENSIVE CARDIAC REHAB ORIENT from 07/11/2023 in [facility]  Date 07/11/23       Visit Diagnosis: 04/26/23 S/P CABG x 2  04/26/23 S/P AVR (aortic valve replacement)  Patient's Home Medications on Admission:  Current Outpatient Medications:    aspirin  81 MG chewable tablet, Chew 81 mg by mouth daily., Disp: , Rfl:    atorvastatin (LIPITOR) 40 MG tablet, Take 1 tablet by mouth at bedtime., Disp: , Rfl:    Cholecalciferol (VITAMIN D3) 75 MCG (3000 UT) TABS, Take 75 mcg by mouth daily. Will bring bottle to verify dose, Disp: , Rfl:    potassium chloride  (KLOR-CON ) 20 MEQ packet, Take 20 mEq by mouth daily., Disp: , Rfl:    XARELTO 20 MG TABS tablet, Take 20 mg by mouth daily., Disp: , Rfl:   Past Medical History: Past Medical History:  Diagnosis Date   Aortic aneurysm (HCC)    Arthritis    Left Knee   Cancer (HCC) 2022   Skin Cancer   GERD (gastroesophageal reflux disease)    Headache    Migraines   Heart murmur    Hyperlipidemia    Hypertension    Peripheral vascular disease (HCC)    Thoracic Aortic Aneurysm   Vertigo     Tobacco Use: Social History   Tobacco Use  Smoking Status Former   Types: Cigarettes  Smokeless Tobacco Never  Tobacco Comments   Pt quit smoking cigarettes in 2005. 1 pack of cigarettes per week.    Labs: Review Flowsheet  More data exists      Latest Ref Rng & Units 04/27/2023 04/28/2023 04/29/2023 04/30/2023 05/01/2023  Labs for ITP Cardiac and Pulmonary Rehab  PH, Arterial 7.35 - 7.45 7.484  7.486  7.503  7.536  7.547  7.440  7.321  7.368  7.419  7.308  7.298  7.285  7.283  7.347  7.385  7.396  7.402  7.403  7.403  7.458  7.455  7.458  7.482  7.512  7.397  7.336  7.394  7.403  7.387  7.378  7.399  7.429   7.395  7.462  7.472  7.379  7.527  7.476  7.523  7.533  7.574  7.413  7.419  7.439   PCO2 arterial 32 - 48 mmHg 44.0  42.9  38.8  33.3  27.5  25.2  27.1  22.8  30.2  47.0  52.2  55.2  56.4  50.1  46.5  56.2  53.3  51.8  52.5  43.1  44.7  44.4  44.8  40.8  37.6  66.0  58.3  55.8  57.0  58.6  55.4  49.3  50.8  45.6  46.5  57.0  36.7  38.7  35.6  35.7  32.9  51.6  49.8  50.6   Bicarbonate 20.0 - 28.0 mmol/L 33.1  32.5  30.6  28.3  24.0  17.3  14.1  13.2  19.5  23.7  25.7  26.4  26.9  27.7  28.0  34.6  33.3  32.4  32.8  30.6  31.6  31.5  33.6  32.8  23.2  35.3  35.7  34.9  34.3  34.6  34.3  32.8  31.2  32.6  34.0  33.6  30.6  28.7  29.3  30.2  30.5  33.1  32.3  34.3   TCO2 22 - 32 mmol/L 34  34  32  29  25  18  15  14  22  20  25  27  28  29  29  29  36  35  34  34  32  33  33  35  34   24  37  37  37  36  36  36  34  33  34  31  35  35  32  30  30  31   32  35  34  36   Acid-base deficit 0.0 - 2.0 mmol/L 6.0  11.0  11.0  4.0  3.0  1.0  1.0  - 2.0  - -  O2 Saturation % 98  98  99  99  100  100  100  100  100  99  99  94  95  98  100  99  99  98  95  89.9  98  99  99  66.8  100  95  100  95  98  98  97  98  64.9  99  99  99  100  100  68.9  100  100  100  98  100  100  98  99  64  99     Details       Multiple values from one day are sorted in reverse-chronological order         Capillary Blood Glucose: Lab Results  Component Value Date   GLUCAP 182 (H) 05/01/2023   GLUCAP 132 (H) 05/01/2023   GLUCAP 146 (H) 05/01/2023   GLUCAP 120 (H) 05/01/2023   GLUCAP 143 (H) 05/01/2023     Exercise Target Goals: Exercise Program Goal: Individual exercise prescription set using results from initial 6 min walk test and THRR while considering  patient's activity barriers and safety.   Exercise Prescription Goal: Initial exercise prescription builds to 30-45 minutes a day of aerobic activity, 2-3 days per week.  Home exercise guidelines will be given to patient during program as part of exercise  prescription that the participant will acknowledge.  Activity Barriers & Risk Stratification:  Activity Barriers & Cardiac Risk Stratification - 07/11/23 1209       Activity Barriers & Cardiac Risk Stratification   Activity Barriers Balance Concerns;Joint Problems;Deconditioning;Assistive Device;Decreased Ventricular Function;Neck/Spine Problems;Back Problems    Cardiac Risk Stratification High             6 Minute Walk:  6 Minute Walk     Row Name 07/11/23 1308         6 Minute Walk   Phase Initial     Distance 668 feet     Walk Time 6 minutes     # of Rest Breaks 0     MPH 1.3     METS 2.11     RPE 11     Perceived Dyspnea  0     VO2 Peak 7.4     Symptoms No     Resting HR 83 bpm     Resting BP 104/70     Resting Oxygen Saturation  100 %     Exercise Oxygen Saturation  during 6 min walk 99 %     Max Ex. HR 98 bpm     Max Ex. BP 110/68     2 Minute Post BP 106/64  Oxygen Initial Assessment:   Oxygen Re-Evaluation:   Oxygen Discharge (Final Oxygen Re-Evaluation):   Initial Exercise Prescription:  Initial Exercise Prescription - 07/11/23 1200       Date of Initial Exercise RX and Referring Provider   Date 07/11/23    Expected Discharge Date 10/04/22      Recumbant Bike   Level 1    RPM 60    Watts 20    Minutes 15    METs 2.1      NuStep   Level 1    SPM 75    Minutes 15    METs 2.1      Prescription Details   Frequency (times per week) 3    Duration Progress to 30 minutes of continuous aerobic without signs/symptoms of physical distress      Intensity   THRR 40-80% of Max Heartrate 64-129    Ratings of Perceived Exertion 11-13    Perceived Dyspnea 0-4      Progression   Progression Continue progressive overload as per policy without signs/symptoms or physical distress.      Resistance Training   Training Prescription Yes    Weight 3 lbs    Reps 10-15             Perform Capillary Blood Glucose checks as  needed.  Exercise Prescription Changes:   Exercise Comments:   Exercise Goals and Review:   Exercise Goals     Row Name 07/11/23 1118             Exercise Goals   Increase Physical Activity Yes       Intervention Provide advice, education, support and counseling about physical activity/exercise needs.;Develop an individualized exercise prescription for aerobic and resistive training based on initial evaluation findings, risk stratification, comorbidities and participant's personal goals.       Expected Outcomes Short Term: Attend rehab on a regular basis to increase amount of physical activity.;Long Term: Exercising regularly at least 3-5 days a week.;Long Term: Add in home exercise to make exercise part of routine and to increase amount of physical activity.       Increase Strength and Stamina Yes       Intervention Provide advice, education, support and counseling about physical activity/exercise needs.;Develop an individualized exercise prescription for aerobic and resistive training based on initial evaluation findings, risk stratification, comorbidities and participant's personal goals.       Expected Outcomes Short Term: Increase workloads from initial exercise prescription for resistance, speed, and METs.;Short Term: Perform resistance training exercises routinely during rehab and add in resistance training at home;Long Term: Improve cardiorespiratory fitness, muscular endurance and strength as measured by increased METs and functional capacity ( )       Able to understand and use rate of perceived exertion (RPE) scale Yes       Intervention Provide education and explanation on how to use RPE scale       Expected Outcomes Short Term: Able to use RPE daily in rehab to express subjective intensity level;Long Term:  Able to use RPE to guide intensity level when exercising independently       Knowledge and understanding of Target Heart Rate Range (THRR) Yes       Intervention  Provide education and explanation of THRR including how the numbers were predicted and where they are located for reference       Expected Outcomes Short Term: Able to state/look up THRR;Long Term: Able to use THRR to govern intensity when  exercising independently;Short Term: Able to use daily as guideline for intensity in rehab       Understanding of Exercise Prescription Yes       Intervention Provide education, explanation, and written materials on patient's individual exercise prescription       Expected Outcomes Short Term: Able to explain program exercise prescription;Long Term: Able to explain home exercise prescription to exercise independently                Exercise Goals Re-Evaluation :   Discharge Exercise Prescription (Final Exercise Prescription Changes):   Nutrition:  Target Goals: Understanding of nutrition guidelines, daily intake of sodium 1500mg , cholesterol 200mg , calories 30% from fat and 7% or less from saturated fats, daily to have 5 or more servings of fruits and vegetables.  Biometrics:  Pre Biometrics - 07/11/23 1130       Pre Biometrics   Waist Circumference 42.5 inches    Hip Circumference 41.5 inches    Waist to Hip Ratio 1.02 %    Triceps Skinfold 20 mm    % Body Fat 30.4 %    Grip Strength 27 kg    Flexibility 0 in   could not reach   Single Leg Stand 4.25 seconds              Nutrition Therapy Plan and Nutrition Goals:   Nutrition Assessments:  MEDIFICTS Score Key: >=70 Need to make dietary changes  40-70 Heart Healthy Diet <= 40 Therapeutic Level Cholesterol Diet    Picture Your Plate Scores: <59 Unhealthy dietary pattern with much room for improvement. 41-50 Dietary pattern unlikely to meet recommendations for good health and room for improvement. 51-60 More healthful dietary pattern, with some room for improvement.  >60 Healthy dietary pattern, although there may be some specific behaviors that could be improved.     Nutrition Goals Re-Evaluation:   Nutrition Goals Re-Evaluation:   Nutrition Goals Discharge (Final Nutrition Goals Re-Evaluation):   Psychosocial: Target Goals: Acknowledge presence or absence of significant depression and/or stress, maximize coping skills, provide positive support system. Participant is able to verbalize types and ability to use techniques and skills needed for reducing stress and depression.  Initial Review & Psychosocial Screening:  Initial Psych Review & Screening - 07/11/23 1443       Initial Review   Current issues with Current Sleep Concerns   sleeping issues due to uncomfortable head wound     Family Dynamics   Good Support System? Yes   Pt has wife Mirta for support     Screening Interventions   Interventions Encouraged to exercise    Expected Outcomes Short Term goal: Identification and review with participant of any Quality of Life or Depression concerns found by scoring the questionnaire.;Long Term goal: The participant improves quality of Life and PHQ9 Scores as seen by post scores and/or verbalization of changes             Quality of Life Scores:  Quality of Life - 07/11/23 1444       Quality of Life   Select Quality of Life      Quality of Life Scores   Health/Function Pre 24.8 %    Socioeconomic Pre 23.31 %    Psych/Spiritual Pre 27.71 %    Family Pre 27.6 %    GLOBAL Pre 25.44 %            Scores of 19 and below usually indicate a poorer quality of life in these areas.  A difference of  2-3 points is a clinically meaningful difference.  A difference of 2-3 points in the total score of the Quality of Life Index has been associated with significant improvement in overall quality of life, self-image, physical symptoms, and general health in studies assessing change in quality of life.  PHQ-9: Review Flowsheet       07/11/2023 07/06/2022  Depression screen PHQ 2/9  Decreased Interest 1 0  Down, Depressed, Hopeless 0 0  PHQ  - 2 Score 1 0  Altered sleeping 3 -  Tired, decreased energy 1 -  Change in appetite 0 -  Feeling bad or failure about yourself  0 -  Trouble concentrating 1 -  Moving slowly or fidgety/restless 2 -  Suicidal thoughts 0 -  PHQ-9 Score 8 -  Difficult doing work/chores Somewhat difficult -   Interpretation of Total Score  Total Score Depression Severity:  1-4 = Minimal depression, 5-9 = Mild depression, 10-14 = Moderate depression, 15-19 = Moderately severe depression, 20-27 = Severe depression   Psychosocial Evaluation and Intervention:   Psychosocial Re-Evaluation:   Psychosocial Discharge (Final Psychosocial Re-Evaluation):   Vocational Rehabilitation: Provide vocational rehab assistance to qualifying candidates.   Vocational Rehab Evaluation & Intervention:  Vocational Rehab - 07/11/23 1119       Initial Vocational Rehab Evaluation & Intervention   Assessment shows need for Vocational Rehabilitation No   Pt is on disability            Education: Education Goals: Education classes will be provided on a weekly basis, covering required topics. Participant will state understanding/return demonstration of topics presented.     Core Videos: Exercise    Move It!  Clinical staff conducted group or individual video education with verbal and written material and guidebook.  Patient learns the recommended Pritikin exercise program. Exercise with the goal of living a long, healthy life. Some of the health benefits of exercise include controlled diabetes, healthier blood pressure levels, improved cholesterol levels, improved heart and lung capacity, improved sleep, and better body composition. Everyone should speak with their doctor before starting or changing an exercise routine.  Biomechanical Limitations Clinical staff conducted group or individual video education with verbal and written material and guidebook.  Patient learns how biomechanical limitations can impact  exercise and how we can mitigate and possibly overcome limitations to have an impactful and balanced exercise routine.  Body Composition Clinical staff conducted group or individual video education with verbal and written material and guidebook.  Patient learns that body composition (ratio of muscle mass to fat mass) is a key component to assessing overall fitness, rather than body weight alone. Increased fat mass, especially visceral belly fat, can put us  at increased risk for metabolic syndrome, type 2 diabetes, heart disease, and even death. It is recommended to combine diet and exercise (cardiovascular and resistance training) to improve your body composition. Seek guidance from your physician and exercise physiologist before implementing an exercise routine.  Exercise Action Plan Clinical staff conducted group or individual video education with verbal and written material and guidebook.  Patient learns the recommended strategies to achieve and enjoy long-term exercise adherence, including variety, self-motivation, self-efficacy, and positive decision making. Benefits of exercise include fitness, good health, weight management, more energy, better sleep, less stress, and overall well-being.  Medical   Heart Disease Risk Reduction Clinical staff conducted group or individual video education with verbal and written material and guidebook.  Patient learns our heart is our most vital organ  as it circulates oxygen, nutrients, white blood cells, and hormones throughout the entire body, and carries waste away. Data supports a plant-based eating plan like the Pritikin Program for its effectiveness in slowing progression of and reversing heart disease. The video provides a number of recommendations to address heart disease.   Metabolic Syndrome and Belly Fat  Clinical staff conducted group or individual video education with verbal and written material and guidebook.  Patient learns what metabolic  syndrome is, how it leads to heart disease, and how one can reverse it and keep it from coming back. You have metabolic syndrome if you have 3 of the following 5 criteria: abdominal obesity, high blood pressure, high triglycerides, low HDL cholesterol, and high blood sugar.  Hypertension and Heart Disease Clinical staff conducted group or individual video education with verbal and written material and guidebook.  Patient learns that high blood pressure, or hypertension, is very common in the [place] . Hypertension is largely due to excessive salt intake, but other important risk factors include being overweight, physical inactivity, drinking too much alcohol, smoking, and not eating enough potassium from fruits and vegetables. High blood pressure is a leading risk factor for heart attack, stroke, congestive heart failure, dementia, kidney failure, and premature death. Long-term effects of excessive salt intake include stiffening of the arteries and thickening of heart muscle and organ damage. Recommendations include ways to reduce hypertension and the risk of heart disease.  Diseases of Our Time - Focusing on Diabetes Clinical staff conducted group or individual video education with verbal and written material and guidebook.  Patient learns why the best way to stop diseases of our time is prevention, through food and other lifestyle changes. Medicine (such as prescription pills and surgeries) is often only a Band-Aid on the problem, not a long-term solution. Most common diseases of our time include obesity, type 2 diabetes, hypertension, heart disease, and cancer. The Pritikin Program is recommended and has been proven to help reduce, reverse, and/or prevent the damaging effects of metabolic syndrome.  Nutrition   Overview of the Pritikin Eating Plan  Clinical staff conducted group or individual video education with verbal and written material and guidebook.  Patient learns about the Pritikin  Eating Plan for disease risk reduction. The Pritikin Eating Plan emphasizes a wide variety of unrefined, minimally-processed carbohydrates, like fruits, vegetables, whole grains, and legumes. Go, Caution, and Stop food choices are explained. Plant-based and lean animal proteins are emphasized. Rationale provided for low sodium intake for blood pressure control, low added sugars for blood sugar stabilization, and low added fats and oils for coronary artery disease risk reduction and weight management.  Calorie Density  Clinical staff conducted group or individual video education with verbal and written material and guidebook.  Patient learns about calorie density and how it impacts the Pritikin Eating Plan. Knowing the characteristics of the food you choose will help you decide whether those foods will lead to weight gain or weight loss, and whether you want to consume more or less of them. Weight loss is usually a side effect of the Pritikin Eating Plan because of its focus on low calorie-dense foods.  Label Reading  Clinical staff conducted group or individual video education with verbal and written material and guidebook.  Patient learns about the Pritikin recommended label reading guidelines and corresponding recommendations regarding calorie density, added sugars, sodium content, and whole grains.  Dining Out - Part 1  Clinical staff conducted group or individual video education with verbal and written  material and guidebook.  Patient learns that restaurant meals can be sabotaging because they can be so high in calories, fat, sodium, and/or sugar. Patient learns recommended strategies on how to positively address this and avoid unhealthy pitfalls.  Facts on Fats  Clinical staff conducted group or individual video education with verbal and written material and guidebook.  Patient learns that lifestyle modifications can be just as effective, if not more so, as many medications for lowering your  risk of heart disease. A Pritikin lifestyle can help to reduce your risk of inflammation and atherosclerosis (cholesterol build-up, or plaque, in the artery walls). Lifestyle interventions such as dietary choices and physical activity address the cause of atherosclerosis. A review of the types of fats and their impact on blood cholesterol levels, along with dietary recommendations to reduce fat intake is also included.  Nutrition Action Plan  Clinical staff conducted group or individual video education with verbal and written material and guidebook.  Patient learns how to incorporate Pritikin recommendations into their lifestyle. Recommendations include planning and keeping personal health goals in mind as an important part of their success.  Healthy Mind-Set    Healthy Minds, Bodies, Hearts  Clinical staff conducted group or individual video education with verbal and written material and guidebook.  Patient learns how to identify when they are stressed. Video will discuss the impact of that stress, as well as the many benefits of stress management. Patient will also be introduced to stress management techniques. The way we think, act, and feel has an impact on our hearts.  How Our Thoughts Can Heal Our Hearts  Clinical staff conducted group or individual video education with verbal and written material and guidebook.  Patient learns that negative thoughts can cause depression and anxiety. This can result in negative lifestyle behavior and serious health problems. Cognitive behavioral therapy is an effective method to help control our thoughts in order to change and improve our emotional outlook.  Additional Videos:  Exercise    Improving Performance  Clinical staff conducted group or individual video education with verbal and written material and guidebook.  Patient learns to use a non-linear approach by alternating intensity levels and lengths of time spent exercising to help burn more calories  and lose more body fat. Cardiovascular exercise helps improve heart health, metabolism, hormonal balance, blood sugar control, and recovery from fatigue. Resistance training improves strength, endurance, balance, coordination, reaction time, metabolism, and muscle mass. Flexibility exercise improves circulation, posture, and balance. Seek guidance from your physician and exercise physiologist before implementing an exercise routine and learn your capabilities and proper form for all exercise.  Introduction to Yoga  Clinical staff conducted group or individual video education with verbal and written material and guidebook.  Patient learns about yoga, a discipline of the coming together of mind, breath, and body. The benefits of yoga include improved flexibility, improved range of motion, better posture and core strength, increased lung function, weight loss, and positive self-image. Yoga's heart health benefits include lowered blood pressure, healthier heart rate, decreased cholesterol and triglyceride levels, improved immune function, and reduced stress. Seek guidance from your physician and exercise physiologist before implementing an exercise routine and learn your capabilities and proper form for all exercise.  Medical   Aging: Enhancing Your Quality of Life  Clinical staff conducted group or individual video education with verbal and written material and guidebook.  Patient learns key strategies and recommendations to stay in good physical health and enhance quality of life, such as prevention  strategies, having an advocate, securing a Health Care Proxy and Power of Attorney, and keeping a list of medications and system for tracking them. It also discusses how to avoid risk for bone loss.  Biology of Weight Control  Clinical staff conducted group or individual video education with verbal and written material and guidebook.  Patient learns that weight gain occurs because we consume more calories than  we burn (eating more, moving less). Even if your body weight is normal, you may have higher ratios of fat compared to muscle mass. Too much body fat puts you at increased risk for cardiovascular disease, heart attack, stroke, type 2 diabetes, and obesity-related cancers. In addition to exercise, following the Pritikin Eating Plan can help reduce your risk.  Decoding Lab Results  Clinical staff conducted group or individual video education with verbal and written material and guidebook.  Patient learns that lab test reflects one measurement whose values change over time and are influenced by many factors, including medication, stress, sleep, exercise, food, hydration, pre-existing medical conditions, and more. It is recommended to use the knowledge from this video to become more involved with your lab results and evaluate your numbers to speak with your doctor.   Diseases of Our Time - Overview  Clinical staff conducted group or individual video education with verbal and written material and guidebook.  Patient learns that according to the CDC, 50% to 70% of chronic diseases (such as obesity, type 2 diabetes, elevated lipids, hypertension, and heart disease) are avoidable through lifestyle improvements including healthier food choices, listening to satiety cues, and increased physical activity.  Sleep Disorders Clinical staff conducted group or individual video education with verbal and written material and guidebook.  Patient learns how good quality and duration of sleep are important to overall health and well-being. Patient also learns about sleep disorders and how they impact health along with recommendations to address them, including discussing with a physician.  Nutrition  Dining Out - Part 2 Clinical staff conducted group or individual video education with verbal and written material and guidebook.  Patient learns how to plan ahead and communicate in order to maximize their dining experience  in a healthy and nutritious manner. Included are recommended food choices based on the type of restaurant the patient is visiting.   Fueling a Banker conducted group or individual video education with verbal and written material and guidebook.  There is a strong connection between our food choices and our health. Diseases like obesity and type 2 diabetes are very prevalent and are in large-part due to lifestyle choices. The Pritikin Eating Plan provides plenty of food and hunger-curbing satisfaction. It is easy to follow, affordable, and helps reduce health risks.  Menu Workshop  Clinical staff conducted group or individual video education with verbal and written material and guidebook.  Patient learns that restaurant meals can sabotage health goals because they are often packed with calories, fat, sodium, and sugar. Recommendations include strategies to plan ahead and to communicate with the manager, chef, or server to help order a healthier meal.  Planning Your Eating Strategy  Clinical staff conducted group or individual video education with verbal and written material and guidebook.  Patient learns about the Pritikin Eating Plan and its benefit of reducing the risk of disease. The Pritikin Eating Plan does not focus on calories. Instead, it emphasizes high-quality, nutrient-rich foods. By knowing the characteristics of the foods, we choose, we can determine their calorie density and make informed decisions.  Targeting Your Nutrition Priorities  Clinical staff conducted group or individual video education with verbal and written material and guidebook.  Patient learns that lifestyle habits have a tremendous impact on disease risk and progression. This video provides eating and physical activity recommendations based on your personal health goals, such as reducing LDL cholesterol, losing weight, preventing or controlling type 2 diabetes, and reducing high blood  pressure.  Vitamins and Minerals  Clinical staff conducted group or individual video education with verbal and written material and guidebook.  Patient learns different ways to obtain key vitamins and minerals, including through a recommended healthy diet. It is important to discuss all supplements you take with your doctor.   Healthy Mind-Set    Smoking Cessation  Clinical staff conducted group or individual video education with verbal and written material and guidebook.  Patient learns that cigarette smoking and tobacco addiction pose a serious health risk which affects millions of people. Stopping smoking will significantly reduce the risk of heart disease, lung disease, and many forms of cancer. Recommended strategies for quitting are covered, including working with your doctor to develop a successful plan.  Culinary   Becoming a Set Designer conducted group or individual video education with verbal and written material and guidebook.  Patient learns that cooking at home can be healthy, cost-effective, quick, and puts them in control. Keys to cooking healthy recipes will include looking at your recipe, assessing your equipment needs, planning ahead, making it simple, choosing cost-effective seasonal ingredients, and limiting the use of added fats, salts, and sugars.  Cooking - Breakfast and Snacks  Clinical staff conducted group or individual video education with verbal and written material and guidebook.  Patient learns how important breakfast is to satiety and nutrition through the entire day. Recommendations include key foods to eat during breakfast to help stabilize blood sugar levels and to prevent overeating at meals later in the day. Planning ahead is also a key component.  Cooking - Educational Psychologist conducted group or individual video education with verbal and written material and guidebook.  Patient learns eating strategies to improve overall  health, including an approach to cook more at home. Recommendations include thinking of animal protein as a side on your plate rather than center stage and focusing instead on lower calorie dense options like vegetables, fruits, whole grains, and plant-based proteins, such as beans. Making sauces in large quantities to freeze for later and leaving the skin on your vegetables are also recommended to maximize your experience.  Cooking - Healthy Salads and Dressing Clinical staff conducted group or individual video education with verbal and written material and guidebook.  Patient learns that vegetables, fruits, whole grains, and legumes are the foundations of the Pritikin Eating Plan. Recommendations include how to incorporate each of these in flavorful and healthy salads, and how to create homemade salad dressings. Proper handling of ingredients is also covered. Cooking - Soups and [employer] - Soups and Desserts Clinical staff conducted group or individual video education with verbal and written material and guidebook.  Patient learns that Pritikin soups and desserts make for easy, nutritious, and delicious snacks and meal components that are low in sodium, fat, sugar, and calorie density, while high in vitamins, minerals, and filling fiber. Recommendations include simple and healthy ideas for soups and desserts.   Overview     The Pritikin Solution Program Overview Clinical staff conducted group or individual video education with verbal and written material and  guidebook.  Patient learns that the results of the Pritikin Program have been documented in more than 100 articles published in peer-reviewed journals, and the benefits include reducing risk factors for (and, in some cases, even reversing) high cholesterol, high blood pressure, type 2 diabetes, obesity, and more! An overview of the three key pillars of the Pritikin Program will be covered: eating well, doing regular exercise, and having a  healthy mind-set.  WORKSHOPS  Exercise: Exercise Basics: Building Your Action Plan Clinical staff led group instruction and group discussion with PowerPoint presentation and patient guidebook. To enhance the learning environment the use of posters, models and videos may be added. At the conclusion of this workshop, patients will comprehend the difference between physical activity and exercise, as well as the benefits of incorporating both, into their routine. Patients will understand the FITT (Frequency, Intensity, Time, and Type) principle and how to use it to build an exercise action plan. In addition, safety concerns and other considerations for exercise and cardiac rehab will be addressed by the presenter. The purpose of this lesson is to promote a comprehensive and effective weekly exercise routine in order to improve patients' overall level of fitness.   Managing Heart Disease: Your Path to a Healthier Heart Clinical staff led group instruction and group discussion with PowerPoint presentation and patient guidebook. To enhance the learning environment the use of posters, models and videos may be added.At the conclusion of this workshop, patients will understand the anatomy and physiology of the heart. Additionally, they will understand how Pritikin's three pillars impact the risk factors, the progression, and the management of heart disease.  The purpose of this lesson is to provide a high-level overview of the heart, heart disease, and how the Pritikin lifestyle positively impacts risk factors.  Exercise Biomechanics Clinical staff led group instruction and group discussion with PowerPoint presentation and patient guidebook. To enhance the learning environment the use of posters, models and videos may be added. Patients will learn how the structural parts of their bodies function and how these functions impact their daily activities, movement, and exercise. Patients will learn how to  promote a neutral spine, learn how to manage pain, and identify ways to improve their physical movement in order to promote healthy living. The purpose of this lesson is to expose patients to common physical limitations that impact physical activity. Participants will learn practical ways to adapt and manage aches and pains, and to minimize their effect on regular exercise. Patients will learn how to maintain good posture while sitting, walking, and lifting.  Balance Training and Fall Prevention  Clinical staff led group instruction and group discussion with PowerPoint presentation and patient guidebook. To enhance the learning environment the use of posters, models and videos may be added. At the conclusion of this workshop, patients will understand the importance of their sensorimotor skills (vision, proprioception, and the vestibular system) in maintaining their ability to balance as they age. Patients will apply a variety of balancing exercises that are appropriate for their current level of function. Patients will understand the common causes for poor balance, possible solutions to these problems, and ways to modify their physical environment in order to minimize their fall risk. The purpose of this lesson is to teach patients about the importance of maintaining balance as they age and ways to minimize their risk of falling.  WORKSHOPS   Nutrition:  Fueling a Ship Broker led group instruction and group discussion with PowerPoint presentation and patient guidebook. To enhance  the learning environment the use of posters, models and videos may be added. Patients will review the foundational principles of the Pritikin Eating Plan and understand what constitutes a serving size in each of the food groups. Patients will also learn Pritikin-friendly foods that are better choices when away from home and review make-ahead meal and snack options. Calorie density will be reviewed and  applied to three nutrition priorities: weight maintenance, weight loss, and weight gain. The purpose of this lesson is to reinforce (in a group setting) the key concepts around what patients are recommended to eat and how to apply these guidelines when away from home by planning and selecting Pritikin-friendly options. Patients will understand how calorie density may be adjusted for different weight management goals.  Mindful Eating  Clinical staff led group instruction and group discussion with PowerPoint presentation and patient guidebook. To enhance the learning environment the use of posters, models and videos may be added. Patients will briefly review the concepts of the Pritikin Eating Plan and the importance of low-calorie dense foods. The concept of mindful eating will be introduced as well as the importance of paying attention to internal hunger signals. Triggers for non-hunger eating and techniques for dealing with triggers will be explored. The purpose of this lesson is to provide patients with the opportunity to review the basic principles of the Pritikin Eating Plan, discuss the value of eating mindfully and how to measure internal cues of hunger and fullness using the Hunger Scale. Patients will also discuss reasons for non-hunger eating and learn strategies to use for controlling emotional eating.  Targeting Your Nutrition Priorities Clinical staff led group instruction and group discussion with PowerPoint presentation and patient guidebook. To enhance the learning environment the use of posters, models and videos may be added. Patients will learn how to determine their genetic susceptibility to disease by reviewing their family history. Patients will gain insight into the importance of diet as part of an overall healthy lifestyle in mitigating the impact of genetics and other environmental insults. The purpose of this lesson is to provide patients with the opportunity to assess their personal  nutrition priorities by looking at their family history, their own health history and current risk factors. Patients will also be able to discuss ways of prioritizing and modifying the Pritikin Eating Plan for their highest risk areas  Menu  Clinical staff led group instruction and group discussion with PowerPoint presentation and patient guidebook. To enhance the learning environment the use of posters, models and videos may be added. Using menus brought in from [employer], or printed from [employer], patients will apply the Pritikin dining out guidelines that were presented in the [employer] video. Patients will also be able to practice these guidelines in a variety of provided scenarios. The purpose of this lesson is to provide patients with the opportunity to practice hands-on learning of the Pritikin Dining Out guidelines with actual menus and practice scenarios.  Label Reading Clinical staff led group instruction and group discussion with PowerPoint presentation and patient guidebook. To enhance the learning environment the use of posters, models and videos may be added. Patients will review and discuss the Pritikin label reading guidelines presented in Pritikin's Label Reading Educational series video. Using fool labels brought in from local grocery stores and markets, patients will apply the label reading guidelines and determine if the packaged food meet the Pritikin guidelines. The purpose of this lesson is to provide patients with the opportunity to review, discuss,  and practice hands-on learning of the Pritikin Label Reading guidelines with actual packaged food labels. Cooking School  Pritikin's [employer] are designed to teach patients ways to prepare quick, simple, and affordable recipes at home. The importance of nutrition's role in chronic disease risk reduction is reflected in its emphasis in the overall Pritikin program. By learning how to prepare  essential core Pritikin Eating Plan recipes, patients will increase control over what they eat; be able to customize the flavor of foods without the use of added salt, sugar, or fat; and improve the quality of the food they consume. By learning a set of core recipes which are easily assembled, quickly prepared, and affordable, patients are more likely to prepare more healthy foods at home. These workshops focus on convenient breakfasts, simple entres, side dishes, and desserts which can be prepared with minimal effort and are consistent with nutrition recommendations for cardiovascular risk reduction. Cooking [employer] are taught by a armed forces logistics/support/administrative officer (RD) who has been trained by the [employer]. The chef or RD has a clear understanding of the importance of minimizing - if not completely eliminating - added fat, sugar, and sodium in recipes. Throughout the series of Cooking School Workshop sessions, patients will learn about healthy ingredients and efficient methods of cooking to build confidence in their capability to prepare    Cooking School weekly topics:  Adding Flavor- Sodium-Free  Fast and Healthy Breakfasts  Powerhouse Plant-Based Proteins  Satisfying Salads and Dressings  Simple Sides and Sauces  International Cuisine-Spotlight on the [employer] Zones  Delicious Desserts  Savory Soups  [employer] - Meals in a Astronomer Appetizers and Snacks  Comforting Weekend Breakfasts  One-Pot Wonders   Fast Evening Meals  Landscape Architect Your Pritikin Plate  WORKSHOPS   Healthy Mindset (Psychosocial):  Focused Goals, Sustainable Changes Clinical staff led group instruction and group discussion with PowerPoint presentation and patient guidebook. To enhance the learning environment the use of posters, models and videos may be added. Patients will be able to apply effective goal setting strategies to establish at least one personal goal, and  then take consistent, meaningful action toward that goal. They will learn to identify common barriers to achieving personal goals and develop strategies to overcome them. Patients will also gain an understanding of how our mind-set can impact our ability to achieve goals and the importance of cultivating a positive and growth-oriented mind-set. The purpose of this lesson is to provide patients with a deeper understanding of how to set and achieve personal goals, as well as the tools and strategies needed to overcome common obstacles which may arise along the way.  From Head to Heart: The Power of a Healthy Outlook  Clinical staff led group instruction and group discussion with PowerPoint presentation and patient guidebook. To enhance the learning environment the use of posters, models and videos may be added. Patients will be able to recognize and describe the impact of emotions and mood on physical health. They will discover the importance of self-care and explore self-care practices which may work for them. Patients will also learn how to utilize the 4 C's to cultivate a healthier outlook and better manage stress and challenges. The purpose of this lesson is to demonstrate to patients how a healthy outlook is an essential part of maintaining good health, especially as they continue their cardiac rehab journey.  Healthy Sleep for a Healthy Heart Clinical staff led group instruction and group discussion  with PowerPoint presentation and patient guidebook. To enhance the learning environment the use of posters, models and videos may be added. At the conclusion of this workshop, patients will be able to demonstrate knowledge of the importance of sleep to overall health, well-being, and quality of life. They will understand the symptoms of, and treatments for, common sleep disorders. Patients will also be able to identify daytime and nighttime behaviors which impact sleep, and they will be able to apply these  tools to help manage sleep-related challenges. The purpose of this lesson is to provide patients with a general overview of sleep and outline the importance of quality sleep. Patients will learn about a few of the most common sleep disorders. Patients will also be introduced to the concept of "sleep hygiene," and discover ways to self-manage certain sleeping problems through simple daily behavior changes. Finally, the workshop will motivate patients by clarifying the links between quality sleep and their goals of heart-healthy living.   Recognizing and Reducing Stress Clinical staff led group instruction and group discussion with PowerPoint presentation and patient guidebook. To enhance the learning environment the use of posters, models and videos may be added. At the conclusion of this workshop, patients will be able to understand the types of stress reactions, differentiate between acute and chronic stress, and recognize the impact that chronic stress has on their health. They will also be able to apply different coping mechanisms, such as reframing negative self-talk. Patients will have the opportunity to practice a variety of stress management techniques, such as deep abdominal breathing, progressive muscle relaxation, and/or guided imagery.  The purpose of this lesson is to educate patients on the role of stress in their lives and to provide healthy techniques for coping with it.  Learning Barriers/Preferences:  Learning Barriers/Preferences - 07/11/23 1445       Learning Barriers/Preferences   Learning Barriers Sight   wears reading glasses   Learning Preferences Video;Written Material;Computer/Internet;Pictoral             Education Topics:  Knowledge Questionnaire Score:  Knowledge Questionnaire Score - 07/11/23 1445       Knowledge Questionnaire Score   Pre Score 21/24             Core Components/Risk Factors/Patient Goals at Admission:  Personal Goals and Risk Factors at  Admission - 07/11/23 1445       Core Components/Risk Factors/Patient Goals on Admission   Hypertension Yes    Intervention Provide education on lifestyle modifcations including regular physical activity/exercise, weight management, moderate sodium restriction and increased consumption of fresh fruit, vegetables, and low fat dairy, alcohol moderation, and smoking cessation.;Monitor prescription use compliance.    Expected Outcomes Short Term: Continued assessment and intervention until BP is < 140/72mm HG in hypertensive participants. < 130/44mm HG in hypertensive participants with diabetes, heart failure or chronic kidney disease.;Long Term: Maintenance of blood pressure at goal levels.    Lipids Yes    Intervention Provide education and support for participant on nutrition & aerobic/resistive exercise along with prescribed medications to achieve LDL 70mg , HDL >40mg .    Expected Outcomes Short Term: Participant states understanding of desired cholesterol values and is compliant with medications prescribed. Participant is following exercise prescription and nutrition guidelines.;Long Term: Cholesterol controlled with medications as prescribed, with individualized exercise RX and with personalized nutrition plan. Value goals: LDL < 70mg , HDL > 40 mg.             Core Components/Risk Factors/Patient Goals Review:  Core Components/Risk Factors/Patient Goals at Discharge (Final Review):    ITP Comments:  ITP Comments     Row Name 07/11/23 1115           ITP Comments Wilbert Bihari, MD:  Medical Director.  Introduction to the Pritikin Education Program/Intensive cardiac Rehab.  [facility] orientation packet reviewed with the patient.                Comments: Participant attended orientation for the cardiac rehabilitation program on  07/11/2023  to perform initial intake and exercise walk test. Patient introduced to the Pritikin Program education and orientation packet was reviewed.  Completed 6-minute walk test, measurements, initial ITP, and exercise prescription. Vital signs stable. Telemetry-normal sinus rhythm, 1st degree AV block, occasional PVC's, asymptomatic.   Service time was from 1035 to 1335.

## 2023-07-12 ENCOUNTER — Ambulatory Visit (INDEPENDENT_AMBULATORY_CARE_PROVIDER_SITE_OTHER): Payer: Medicare Other | Admitting: Plastic Surgery

## 2023-07-12 ENCOUNTER — Telehealth: Payer: Self-pay

## 2023-07-12 ENCOUNTER — Encounter: Payer: Self-pay | Admitting: Plastic Surgery

## 2023-07-12 ENCOUNTER — Ambulatory Visit: Payer: Medicare Other

## 2023-07-12 VITALS — BP 122/79 | HR 90

## 2023-07-12 DIAGNOSIS — L89812 Pressure ulcer of head, stage 2: Secondary | ICD-10-CM | POA: Diagnosis not present

## 2023-07-12 DIAGNOSIS — R498 Other voice and resonance disorders: Secondary | ICD-10-CM | POA: Diagnosis not present

## 2023-07-12 NOTE — Telephone Encounter (Signed)
 Faxed a wound care supply request to [facility]  with confirmed receipt. Will scan to patient's chart.

## 2023-07-12 NOTE — Therapy (Signed)
 OUTPATIENT SPEECH LANGUAGE PATHOLOGY TREATMENT   Patient Name: Aram Domzalski MRN: [id number] DOB:[birth year], 58 y.o., male Today's Date: 07/12/2023  PCP: Leonel Cole MD REFERRING PROVIDER: Leonel Cole, MD  END OF SESSION:  End of Session - 07/12/23 0843     Visit Number 3    Number of Visits 25    Date for SLP Re-Evaluation 09/12/23    SLP Start Time 0845    SLP Stop Time  0930    SLP Time Calculation (min) 45 min    Activity Tolerance Patient tolerated treatment well               Past Medical History:  Diagnosis Date   Aortic aneurysm (HCC)    Arthritis    Left Knee   Cancer (HCC) 2022   Skin Cancer   GERD (gastroesophageal reflux disease)    Headache    Migraines   Heart murmur    Hyperlipidemia    Hypertension    Peripheral vascular disease (HCC)    Thoracic Aortic Aneurysm   Vertigo    Past Surgical History:  Procedure Laterality Date   AORTIC VALVE REPLACEMENT N/A 04/26/2023   Procedure: AORTIC VALVE REPLACEMENT (AVR) USING 23 MM KONECT RESILIA AORTIC VALVED CONDUIT;  Surgeon: Maryjane Mt, MD;  Location: [facility] OR;  Service: Open Heart Surgery;  Laterality: N/A;   APPENDECTOMY     1990s   CANNULATION FOR ECMO (EXTRACORPOREAL MEMBRANE OXYGENATION) N/A 04/26/2023   Procedure: CENTRAL CANNULATION FOR ECMO (EXTRACORPOREAL MEMBRANE OXYGENATION);  Surgeon: Maryjane Mt, MD;  Location: [facility] OR;  Service: Open Heart Surgery;  Laterality: N/A;   COLONOSCOPY WITH ESOPHAGOGASTRODUODENOSCOPY (EGD)     2020s   CORONARY ARTERY BYPASS GRAFT N/A 04/26/2023   Procedure: CORONARY ARTERY BYPASS GRAFTING (CABG) x 2 USING RIGHT AND LEFT GREATER SAPHENOUS VEIN HARVESTED OPEN;  Surgeon: Maryjane Mt, MD;  Location: [facility] OR;  Service: Open Heart Surgery;  Laterality: N/A;   EXPLORATION POST OPERATIVE OPEN HEART N/A 04/26/2023   Procedure: EXPLORATION POST OPERATIVE OPEN HEART;  Surgeon: Maryjane Mt, MD;  Location: [facility] OR;  Service: Open Heart Surgery;  Laterality: N/A;    EXPLORATION POST OPERATIVE OPEN HEART N/A 04/27/2023   Procedure: EXPLORATION POST OPERATIVE OPEN HEART;  Surgeon: Maryjane Mt, MD;  Location: [facility] OR;  Service: Open Heart Surgery;  Laterality: N/A;   EYE SURGERY Right    eye tissue removed   MOUTH SURGERY  2022   Skin Cancer Removal   REPLACEMENT ASCENDING AORTA N/A 04/26/2023   Procedure: REPLACEMENT ASCENDING AORTA USING 23 MM KONECT RESILIA AORTIC VALVED CONDUIT;  Surgeon: Maryjane Mt, MD;  [facility];  Service: Open Heart Surgery;  Laterality: N/A;   RIGHT/LEFT HEART CATH AND CORONARY ANGIOGRAPHY N/A 12/28/2022   Procedure: RIGHT/LEFT HEART CATH AND CORONARY ANGIOGRAPHY;  Surgeon: Jordan, Peter M, MD;  Location: [facility] INVASIVE CV LAB;  Service: Cardiovascular;  Laterality: N/A;   STERIOD INJECTION  09/16/2011   Procedure: MINOR STEROID INJECTION;  Surgeon: Lacinda Ozell Mans, MD;  Location: [facility];  Service: Orthopedics;  Laterality: Left;  Left Lumbar Five-Sacral One Transforaminal Epidural Steroid Injection   STERNAL WOUND DEBRIDEMENT N/A 05/01/2023   Procedure: MEDIAL STERNAL WOUND DEBRIDEMENT;  Surgeon: Obadiah Coy, MD;  Location: [facility] OR;  Service: Thoracic;  Laterality: N/A;   TEE WITHOUT CARDIOVERSION N/A 11/05/2020   Procedure: TRANSESOPHAGEAL ECHOCARDIOGRAM (TEE);  Surgeon: Santo Stanly LABOR, MD;  Location: [facility] ENDOSCOPY;  Service: Cardiovascular;  Laterality: N/A;   TEE WITHOUT CARDIOVERSION N/A 04/26/2023  Procedure: TRANSESOPHAGEAL ECHOCARDIOGRAM;  Surgeon: Maryjane Mt, MD;  Location: [facility] OR;  Service: Open Heart Surgery;  Laterality: N/A;   TEE WITHOUT CARDIOVERSION N/A 04/27/2023   Procedure: TRANSESOPHAGEAL ECHOCARDIOGRAM (TEE);  Surgeon: Maryjane Mt, MD;  Location: [facility] OR;  Service: Open Heart Surgery;  Laterality: N/A;   TEE WITHOUT CARDIOVERSION N/A 05/01/2023   Procedure: TRANSESOPHAGEAL ECHOCARDIOGRAM (TEE);  Surgeon: Obadiah Coy, MD;  Location: [facility] OR;  Service: Thoracic;   Laterality: N/A;   Patient Active Problem List   Diagnosis Date Noted   Aortic valve replaced 04/26/2023   S/P AVR (aortic valve replacement) 04/26/2023   Chronic migraine without aura without status migrainosus, not intractable 09/14/2022   Essential hypertension 07/07/2022   Squamous cell carcinoma, 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   Migraine with aura and without status migrainosus, not intractable 02/19/2021   Thoracic aortic aneurysm without rupture (HCC) 10/05/2020   Severe aortic stenosis 10/05/2020   Bicuspid aortic valve 10/05/2020   Family history of aortic dissection 10/05/2020   Periodontitis 10/05/2020   Memory loss 02/24/2014   Chronic back pain 02/24/2014    ONSET DATE: 06/19/23 (referral date)   REFERRING DIAG: R13.10 (ICD-10-CM) - Dysphagia, unspecified  THERAPY DIAG: Other voice and resonance disorders  Rationale for Evaluation and Treatment: Rehabilitation  SUBJECTIVE:   SUBJECTIVE STATEMENT: Pt reported compliance with HEP, reduced throat clearing and regularly drinking water . Pt accompanied by: self  PERTINENT HISTORY: Johnathan Anderson is a 58 y.o. male with bicuspid AoV with stenosis and ascending aortic aneurysm who is s/p [employer], CABG x 2 (SVG-RCA, SVG-LAD) on 11/20 at OSH c/b cardiac arrest and bleeding requiring return to OR for exploration, MTP and cannulation of central VA ECMO cannulation 11/21. He was transferred to [facility] on 11/26 for evaluation of advanced heart failure therapies and went to OR on 11/27 for VA ECMO decannulation and IABP placement.   The hospital course was significant for the following:. Hypoxic respiratory failure: Required mechanical ventilation, extubated 12/10. O2 was weaned while inpatient. Bronchoscopies were performed prn - last on 12/9.  Dysphagia: Speech and nutrition were consulted. FEEs on 12/20 cleared for soft and bite sized. DHT was placed for enteral nutrition and removed on 12/23 .Diet at time of discharge reg/thins.   PAIN: Are  you having pain? Yes: NPRS scale: 6/10 Pain location: back, rib cage  Aggravating factors: Coughing, laughing Relieving factors: only when coughing  FALLS: Has patient fallen in last 6 months?  No  LIVING ENVIRONMENT: Lives with: lives with their family Lives in: House/apartment  PLOF:  Level of assistance: Independent with ADLs, Independent with IADLs Employment: On disability since 2022  PATIENT GOALS: Improve swallow and voice  OBJECTIVE:  Note: Objective measures were completed at Evaluation unless otherwise noted. OBJECTIVE:                                                                                    TREATMENT DATE:   07/11/23: Pt reported drinking more water  to thin phlegm and reduced urge to clear throat overall. Intermittent throat clearing still evidenced today, with SLP re-educating and fading cues for throat clear alternatives. ST continued education and instruction SOVTE. Pt completed exercises effectively with occasional min-A,  verbal cues and clinician model. ST then introduced Resonant Voice Therapy targeting gentle easy phonation and reduced vocal tension. ST guided pt through exercises, in which pt able to demo with occasional min A to optimize forward resonance. Utilized negative practice with model to aid pt awareness of correct production. Increasing awareness of forward vs back focused phonation exhibited throughout exercise trials.   07-10-23: ST reviewed MBSS results with patient and provided education on his [facility] swallow function. Utilized MBSS as visual support. No further swallowing intervention warranted per MBSS results. Provided pt education on potential causes of globus sensation including reflux given pt's history with GERD and potential causes and symptoms of vocal cord dysfunction. Pt informed ST he frequently coughs, clears his throat. ST educated on vocal hygiene practices to prevent irritation caused by these behaviors. ST then initiated semi-occluded  vocal tract exercises (SOVTE) targeting reduced muscle tension and strong breath support. Pt participated effectively and completed exercises with mod-A and a clinician model. ST plans to continue implementing SOVTE in future sessions and review strategies for cough suppression/throat clearing.  06-20-23: Provided recommendations for MBSS and ENT referral d/t complaints on ongoing dysphagia, globus sensation, persistent phlegm, and usual throat clearing. Pt and family verbalized understanding and agreement.    PATIENT EDUCATION: Education details: see above  Person educated: Patient and Spouse Education method: Explanation Education comprehension: verbalized understanding and needs further education   ASSESSMENT:  CLINICAL IMPRESSION: Patient is a 58 y.o. M who was seen today for dysphagia and concerns regarding persistent globus sensation following medical complications of cardiac surgeries. Intubated x2 with prolonged NPO status. Discharged from hospital on regular textures/thin liquids. MBSS on 07/06/23 revealed functional oropharyngeal swallow and unremarkable sweep of esophagus. Pt tolerated thin, mildly thick, moderately thick liquids, puree, regular solids, and small pill with thin liquids with no overt challenges noted. Reported hx of GERD but is not taking PPI currently. Presented with usual throat clearing. Presents with mild hoarseness and roughness. Endorsed drastic change in voice and impaired breath support impacting clarity and intensity as conversation progresses. Pt has scheduled visit to ENT to further evaluate larynx and swallow function. Update POC pending those results. Pt would benefit from skilled ST intervention to optimize vocal hygiene and improve voicing to aid return to baseline.   OBJECTIVE IMPAIRMENTS: include voice disorder and dysphagia. These impairments are limiting patient from effectively communicating at home and in community and safety when swallowing. Factors  affecting potential to achieve goals and functional outcome are co-morbidities and previous level of function. Patient will benefit from skilled SLP services to address above impairments and improve overall function.  REHAB POTENTIAL: Good   GOALS: Goals reviewed with patient? Yes  SHORT TERM GOALS: Target date: 07/18/2023  Pt will complete MBSS to evaluate current swallow function Baseline: Goal status: MET  2.  Pt will perform recommended swallow exercises with rare min A x 2 sessions Baseline:  Goal status: DEFERRED - WNL swallow  3.  Pt will utilize recommended swallow precautions with rare min A x2 sessions  Baseline:  Goal status: DEFERRED - WNL swallow  4.  Pt will reduce throat clearing by 50% by STG date Baseline: > 7 Goal status: IN PROGRESS  5.  Pt will complete recommended voice exercises with occasional min A x2 sessions (pending ENT evaluation) Baseline:  Goal status: IN PROGRESS   LONG TERM GOALS: Target date: 09/12/2023  Pt will carryover recommended swallow exercises/precautions > 1 week with mod I   Baseline:  Goal status:  DEFERRED - WNL swallow  2.  Pt will demonstrate clear voicing on structured tasks given rare min A x2 sessions  Baseline:  Goal status: IN PROGRESS  3.  Pt will carryover clear voicing in unstructured conversations given rare min A x2 sessions  Baseline:  Goal status: IN PROGRESS  4.  Pt will report improved voice and swallow functioning via PROMS (see above) by 2 pts on each  Baseline:  Goal status: IN PROGRESS   PLAN:  SLP FREQUENCY: 2x/week  SLP DURATION: 12 weeks  PLANNED INTERVENTIONS: Aspiration precaution training, Pharyngeal strengthening exercises, Diet toleration management , Trials of upgraded texture/liquids, Internal/external aids, Oral motor exercises, Functional tasks, Multimodal communication approach, SLP instruction and feedback, Compensatory strategies, Patient/family education, [phone] Treatment of speech (30  or 45 min) , and [id number] Treatment of swallowing function    Comer LILLETTE Louder, CCC-SLP 07/12/2023, 10:18 AM

## 2023-07-12 NOTE — Patient Instructions (Addendum)
 Resonant Voice Exercises:  1 Take a deep breath and produce a smooth, steady hum. Try to make a sound between "h" and "m". Feel for a buzz in the lips and nose.  2 Take a good belly breath and hum. Then move it gently into a vowel.  Hmmmeeee   Hmmmoooo   Hmmmohhh   Hmmmahhh   Hmmmyyyy   Hmmmayyy

## 2023-07-12 NOTE — Progress Notes (Signed)
 Referring Provider Leonel Cole, MD [address] [place],  [place] [address]   CC:  Chief Complaint  Patient presents with   Advice Only   Skin Problem      Johnathan Anderson is an 58 y.o. male.  HPI: Johnathan Anderson is a 59 year old male who presents today for evaluation of a pressure ulcer on the occiput on the right side just above the nuchal crest.  Patient has a very long complicated history of heart surgery with postoperative complications.  He was hospitalized after that and at some point developed what appears to be a pressure ulcer on the back of his head.  This has been treated by his wife with dressing changes and on viewing the photographs that she has had appears to be healing well.  Are interested to know if there is anything that can be done to speed the healing.  Allergies  Allergen Reactions   Levaquin [Levofloxacin]     unknown    Outpatient Encounter Medications as of 07/12/2023  Medication Sig   aspirin  81 MG chewable tablet Chew 81 mg by mouth daily.   atorvastatin (LIPITOR) 40 MG tablet Take 1 tablet by mouth at bedtime.   Cholecalciferol (VITAMIN D3) 75 MCG (3000 UT) TABS Take 75 mcg by mouth daily. Will bring bottle to verify dose   potassium chloride  (KLOR-CON ) 20 MEQ packet Take 20 mEq by mouth daily.   XARELTO 20 MG TABS tablet Take 20 mg by mouth daily.   No facility-administered encounter medications on file as of 07/12/2023.     Past Medical History:  Diagnosis Date   Aortic aneurysm [facility])    Arthritis    Left Knee   Cancer (HCC) 2022   Skin Cancer   GERD (gastroesophageal reflux disease)    Headache    Migraines   Heart murmur    Hyperlipidemia    Hypertension    Peripheral vascular disease (HCC)    Thoracic Aortic Aneurysm   Vertigo     Past Surgical History:  Procedure Laterality Date   AORTIC VALVE REPLACEMENT N/A 04/26/2023   Procedure: AORTIC VALVE REPLACEMENT (AVR) USING 23 MM KONECT RESILIA AORTIC VALVED CONDUIT;   Surgeon: Maryjane Mt, MD;  Location: [facility] OR;  Service: Open Heart Surgery;  Laterality: N/A;   APPENDECTOMY     1990s   CANNULATION FOR ECMO (EXTRACORPOREAL MEMBRANE OXYGENATION) N/A 04/26/2023   Procedure: CENTRAL CANNULATION FOR ECMO (EXTRACORPOREAL MEMBRANE OXYGENATION);  Surgeon: Maryjane Mt, MD;  Location: [facility] OR;  Service: Open Heart Surgery;  Laterality: N/A;   COLONOSCOPY WITH ESOPHAGOGASTRODUODENOSCOPY (EGD)     2020s   CORONARY ARTERY BYPASS GRAFT N/A 04/26/2023   Procedure: CORONARY ARTERY BYPASS GRAFTING (CABG) x 2 USING RIGHT AND LEFT GREATER SAPHENOUS VEIN HARVESTED OPEN;  Surgeon: Maryjane Mt, MD;  Location: [facility] OR;  Service: Open Heart Surgery;  Laterality: N/A;   EXPLORATION POST OPERATIVE OPEN HEART N/A 04/26/2023   Procedure: EXPLORATION POST OPERATIVE OPEN HEART;  Surgeon: Maryjane Mt, MD;  Location: [facility] OR;  Service: Open Heart Surgery;  Laterality: N/A;   EXPLORATION POST OPERATIVE OPEN HEART N/A 04/27/2023   Procedure: EXPLORATION POST OPERATIVE OPEN HEART;  Surgeon: Maryjane Mt, MD;  Location: [facility] OR;  Service: Open Heart Surgery;  Laterality: N/A;   EYE SURGERY Right    eye tissue removed   MOUTH SURGERY  2022   Skin Cancer Removal   REPLACEMENT ASCENDING AORTA N/A 04/26/2023   Procedure: REPLACEMENT ASCENDING AORTA USING 23 MM  KONECT RESILIA AORTIC VALVED CONDUIT;  Surgeon: Maryjane Mt, MD;  Location[facility] OR;  Service: Open Heart Surgery;  Laterality: N/A;   RIGHT/LEFT HEART CATH AND CORONARY ANGIOGRAPHY N/A 12/28/2022   Procedure: RIGHT/LEFT HEART CATH AND CORONARY ANGIOGRAPHY;  Surgeon: Jordan, Peter M, MD;  Location: [facility] INVASIVE CV LAB;  Service: Cardiovascular;  Laterality: N/A;   STERIOD INJECTION  09/16/2011   Procedure: MINOR STEROID INJECTION;  Surgeon: Lacinda Ozell Mans, MD;  Location: [facility];  Service: Orthopedics;  Laterality: Left;  Left Lumbar Five-Sacral One Transforaminal Epidural Steroid Injection   STERNAL WOUND DEBRIDEMENT  N/A 05/01/2023   Procedure: MEDIAL STERNAL WOUND DEBRIDEMENT;  Surgeon: Obadiah Coy, MD;  Location: [facility] OR;  Service: Thoracic;  Laterality: N/A;   TEE WITHOUT CARDIOVERSION N/A 11/05/2020   Procedure: TRANSESOPHAGEAL ECHOCARDIOGRAM (TEE);  Surgeon: Santo Stanly LABOR, MD;  Location: [facility] ENDOSCOPY;  Service: Cardiovascular;  Laterality: N/A;   TEE WITHOUT CARDIOVERSION N/A 04/26/2023   Procedure: TRANSESOPHAGEAL ECHOCARDIOGRAM;  Surgeon: Maryjane Mt, MD;  Location: [facility] OR;  Service: Open Heart Surgery;  Laterality: N/A;   TEE WITHOUT CARDIOVERSION N/A 04/27/2023   Procedure: TRANSESOPHAGEAL ECHOCARDIOGRAM (TEE);  Surgeon: Maryjane Mt, MD;  Location: [facility] OR;  Service: Open Heart Surgery;  Laterality: N/A;   TEE WITHOUT CARDIOVERSION N/A 05/01/2023   Procedure: TRANSESOPHAGEAL ECHOCARDIOGRAM (TEE);  Surgeon: Obadiah Coy, MD;  Location: [facility] OR;  Service: Thoracic;  Laterality: N/A;    Family History  Problem Relation Age of Onset   Hypertension Mother    Diabetes Father    Migraines Neg Hx     Social History   Social History Narrative   Not on file     Review of Systems General: Denies fevers, chills, weight loss CV: Denies chest pain, shortness of breath, palpitations Skin: Open granulating wound on the back of the head.  Johnathan Anderson states that this is uncomfortable when he lays on it but otherwise he has minimal discomfort.  Physical Exam    07/12/2023   11:05 AM 07/11/2023   11:30 AM 06/15/2023    1:54 PM  Vitals with BMI  Height  5' 7 5' 6  Weight  188 lbs 8 oz 190 lbs  BMI  29.52 30.68  Systolic 122 104 886  Diastolic 79 70 79  Pulse 90  91    General:  No acute distress,  Alert and oriented, Non-Toxic, Normal speech and affect Integument: There is a 4 x 3 x 2 mm wound on the occiput right side just above the nuchal ridge.  Most of the wound has a significant amount of granulation tissue but there is a 1 x 2 cm area of necrotic tissue in the center of the wound.   There is no surrounding erythema there is no drainage.   Assessment/Plan Pressure ulcer: Patient has a small healing pressure ulcer on the occiput.  I suspect with continued wound care that this will continue to heal on its own.  I did perform light debridement with scissors over an area of approximately 1 x 2 cm to remove some of the eschar.  The remainder of the wound was cauterized with silver nitrate.  The portion cauterized was approximately 2 x 2 cm. We discussed wound care.  He may shower and wash his hair as normal.  They have been encouraged to purchase a Vashe to clean the wound.  We will also contact [employer]  to provide Adaptic nonadherent dressing.  They will put a water -based lubricant over the Adaptic  and change the dressing daily.  He will follow-up with me in 2 weeks to reevaluate the wound.  As long as it continues to heal I do not believe he needs any surgical intervention.  I did tell him that if at any point the wound healing stalls then we may consider operative debridement.  Johnathan Anderson 07/12/2023, 11:57 AM

## 2023-07-17 ENCOUNTER — Encounter (HOSPITAL_COMMUNITY)
Admission: RE | Admit: 2023-07-17 | Discharge: 2023-07-17 | Disposition: A | Discharge status: Home / Self Care | Payer: Medicare Other | Source: Ambulatory Visit | Attending: Internal Medicine | Admitting: Internal Medicine

## 2023-07-17 DIAGNOSIS — Z952 Presence of prosthetic heart valve: Secondary | ICD-10-CM

## 2023-07-17 DIAGNOSIS — Z951 Presence of aortocoronary bypass graft: Secondary | ICD-10-CM

## 2023-07-17 NOTE — Progress Notes (Signed)
 Daily Session Note  Patient Details  Name: Johnathan Anderson MRN: [id number] Date of Birth: 10/02/1965 Referring Provider:    Encounter Date: 07/17/2023  Check In:  Session Check In - 07/17/23 1422       Check-In   Supervising physician immediately available to respond to emergencies [facility] - Physician supervision    Physician(s) Slater Duncan, NP    Staff Present France Ina, MS, Exercise Physiologist;Johnny Alexia Angelucci, MS, Exercise Physiologist;Olinty Gaylene Kays, MS, ACSM-CEP, Exercise Physiologist;Jetta Otho Blitz BS, ACSM-CEP, Exercise Physiologist;David Makemson, MS, ACSM-CEP, CCRP, Exercise Physiologist;Siris Hoos, RN, BSN    Virtual Visit No    Medication changes reported     No    Fall or balance concerns reported    No    Tobacco Cessation No Change    Warm-up and Cool-down Performed as group-led instruction    Resistance Training Performed Yes    VAD Patient? No    PAD/SET Patient? No      Pain Assessment   Currently in Pain? No/denies    Pain Score 0-No pain    Multiple Pain Sites No             Capillary Blood Glucose: No results found for this or any previous visit (from the past 24 hours).   Exercise Prescription Changes - 07/17/23 1652       Response to Exercise   Blood Pressure (Admit) 96/64    Blood Pressure (Exercise) 132/64    Blood Pressure (Exit) 104/60    Heart Rate (Admit) 84 bpm    Heart Rate (Exercise) 96 bpm    Heart Rate (Exit) 1.73 bpm    Rating of Perceived Exertion (Exercise) 10    Perceived Dyspnea (Exercise) 0    Symptoms 0    Comments Pt first day in the [facility] I[facility] program    Duration Progress to 30 minutes of  aerobic without signs/symptoms of physical distress    Intensity THRR unchanged      Progression   Progression Continue to progress workloads to maintain intensity without signs/symptoms of physical distress.    Average METs 1.65      Resistance Training   Training Prescription Yes    Weight 3 lbs    Reps 10-15    Time 10  Minutes      Recumbant Bike   Level 1    RPM 44    Watts 12    Minutes 15    METs 1.9      NuStep   Level 1    SPM 59    Minutes 15    METs 1.4             Social History   Tobacco Use  Smoking Status Former   Types: Cigarettes  Smokeless Tobacco Never  Tobacco Comments   Pt quit smoking cigarettes in 2005. 1 pack of cigarettes per week.    Goals Met:  Exercise tolerated well No report of concerns or symptoms today Strength training completed today  Goals Unmet:  Not Applicable  Comments: [name] started cardiac rehab today.  Pt tolerated light exercise without difficulty. VSS, telemetry-Sinus Rhythm, , asymptomatic.  Medication list reconciled. Pt denies barriers to medicaiton compliance.  PSYCHOSOCIAL ASSESSMENT:  PHQ-8. Abdulaziz says he has difficulty sleeping as he has a healing pressure ulcer on the back of his head from his extended hospitalization S/P [facility]. Jermie denies being depressed currently.  Pt exhibits positive coping skills, hopeful outlook with supportive family. No psychosocial needs identified at this time,  no psychosocial interventions necessary.    Pt enjoys fishing, soccer and football. Spending time with 60 year old son.  Pt oriented to exercise equipment and routine.    Understanding verbalized. Macallan uses a cane for stability. Monte Xayne RN BSN    Dr. Gaylyn Keas is Medical Director for Cardiac Rehab at [facility].

## 2023-07-18 ENCOUNTER — Telehealth (HOSPITAL_COMMUNITY): Payer: Self-pay | Admitting: *Deleted

## 2023-07-18 NOTE — Telephone Encounter (Signed)
Message left on departmental voicemail for Cardiac Rehab.  Pt will be absent on tomorrow 2/12 due to conflict in schedule with an EENT appt.  Will return on 2/14. CR staff notified. Alanson Aly, BSN Cardiac and Emergency planning/management officer

## 2023-07-19 ENCOUNTER — Encounter (INDEPENDENT_AMBULATORY_CARE_PROVIDER_SITE_OTHER): Payer: Self-pay | Admitting: Otolaryngology

## 2023-07-19 ENCOUNTER — Ambulatory Visit (INDEPENDENT_AMBULATORY_CARE_PROVIDER_SITE_OTHER): Payer: Medicare Other | Admitting: Otolaryngology

## 2023-07-19 ENCOUNTER — Encounter: Payer: Self-pay | Admitting: Internal Medicine

## 2023-07-19 VITALS — BP 123/77 | HR 84 | Ht 66.0 in | Wt 185.0 lb

## 2023-07-19 DIAGNOSIS — K219 Gastro-esophageal reflux disease without esophagitis: Secondary | ICD-10-CM | POA: Diagnosis not present

## 2023-07-19 DIAGNOSIS — R490 Dysphonia: Secondary | ICD-10-CM | POA: Diagnosis not present

## 2023-07-19 DIAGNOSIS — J3089 Other allergic rhinitis: Secondary | ICD-10-CM

## 2023-07-19 DIAGNOSIS — R0982 Postnasal drip: Secondary | ICD-10-CM

## 2023-07-19 DIAGNOSIS — R0989 Other specified symptoms and signs involving the circulatory and respiratory systems: Secondary | ICD-10-CM

## 2023-07-19 DIAGNOSIS — R1310 Dysphagia, unspecified: Secondary | ICD-10-CM | POA: Diagnosis not present

## 2023-07-19 DIAGNOSIS — J383 Other diseases of vocal cords: Secondary | ICD-10-CM | POA: Diagnosis not present

## 2023-07-19 MED ORDER — CETIRIZINE HCL 10 MG PO TABS: 10.0000 mg | ORAL_TABLET | Freq: Every day | ORAL | 11 refills | Status: DC

## 2023-07-19 MED ORDER — FAMOTIDINE 40 MG PO TABS: 40.0000 mg | ORAL_TABLET | Freq: Every day | ORAL | 3 refills | Status: DC

## 2023-07-19 MED ORDER — SALINE SPRAY 0.65 % NA SOLN: 1.0000 | NASAL | 5 refills | Status: DC | PRN

## 2023-07-19 MED ORDER — OMEPRAZOLE 40 MG PO CPDR: 40.0000 mg | DELAYED_RELEASE_CAPSULE | Freq: Every day | ORAL | 3 refills | Status: DC

## 2023-07-19 MED ORDER — FLUTICASONE PROPIONATE 50 MCG/ACT NA SUSP: 2.0000 | Freq: Every day | NASAL | 6 refills | Status: DC

## 2023-07-19 NOTE — Progress Notes (Signed)
 ENT CONSULT:  Reason for Consult: dysphagia and dysphonia hx of intubation x 2 after CABG  HPI: Discussed the use of AI scribe software for clinical note transcription with the patient, who gave verbal consent to proceed.  History of Present Illness   Johnathan Anderson is a 58 year old male who presents with problems with swallowing and voice changes following recent admission and prolonged hospital stay after open heart surgery (admitted around Thanksgiving 2024). He was referred by a speech therapist for further evaluation.  He experiences problems with swallowing and voice changes following his recent hospitalization for open heart surgery. His voice is described as 'hoarse' and not sounding like his usual voice. He also experiences frequent throat clearing and a sensation of something being stuck in his throat, which occurs with swallowing anything, including water and food. No coughing or choking when drinking water. He recalls a recent episode where he had a prolonged coughing fit after a phone call, which resulted in coughing up mucus, although this resolved by the next morning.  He was discharged on May 30, 2023, after being admitted on April 26, 2023. During his hospital stay, he was intubated twice, which he believes may have contributed to his current symptoms. Prior to his admission, he had no issues with swallowing.  He has a history of heartburn but is not currently taking any medication for it. He previously used omeprazole but discontinued it before his surgery. He has lost approximately 19 pounds since his surgery, going from 204 pounds to 185 pounds.  A swallow study conducted at the end of January 2025 showed normal swallowing function. He has no history of stroke. He is currently seeing a speech therapist as an outpatient.      Records Reviewed:  SLP note 07/12/23 Johnathan Anderson is a 58 y.o. male with bicuspid AoV with stenosis and ascending aortic aneurysm who is s/p  [employer], CABG x 2 (SVG-RCA, SVG-LAD) on 11/20 at OSH c/b cardiac arrest and bleeding requiring return to OR for exploration, MTP and cannulation of central VA ECMO cannulation 11/21. He was transferred to [facility] on 11/26 for evaluation of advanced heart failure therapies and went to OR on 11/27 for VA ECMO decannulation and IABP placement.   The hospital course was significant for the following:. Hypoxic respiratory failure: Required mechanical ventilation, extubated 12/10. O2 was weaned while inpatient. Bronchoscopies were performed prn - last on 12/9.  Dysphagia: Speech and nutrition were consulted. FEEs on 12/20 cleared for soft and bite sized. DHT was placed for enteral nutrition and removed on 12/23 .Diet at time of discharge reg/thins."   Patient is a 58 y.o. M who was seen today for dysphagia and concerns regarding persistent globus sensation following medical complications of cardiac surgeries. Intubated x2 with prolonged NPO status. Discharged from hospital on regular textures/thin liquids. MBSS on 07/06/23 revealed functional oropharyngeal swallow and unremarkable sweep of esophagus. Pt tolerated thin, mildly thick, moderately thick liquids, puree, regular solids, and small pill with thin liquids with no overt challenges noted. Reported hx of GERD but is not taking PPI currently. Presented with usual throat clearing. Presents with mild hoarseness and roughness. Endorsed drastic change in voice and impaired breath support impacting clarity and intensity as conversation progresses. Pt has scheduled visit to ENT to further evaluate larynx and swallow function. Update POC pending those results. Pt would benefit from skilled ST intervention to optimize vocal hygiene and improve voicing to aid return to baseline.      Past Medical History:  Diagnosis Date   Aortic aneurysm [facility])    Arthritis    Left Knee   Cancer (HCC) 2022   Skin Cancer   GERD (gastroesophageal reflux disease)    Headache     Migraines   Heart murmur    Hyperlipidemia    Hypertension    Peripheral vascular disease (HCC)    Thoracic Aortic Aneurysm   Vertigo     Past Surgical History:  Procedure Laterality Date   AORTIC VALVE REPLACEMENT N/A 04/26/2023   Procedure: AORTIC VALVE REPLACEMENT (AVR) USING 23 MM KONECT RESILIA AORTIC VALVED CONDUIT;  Surgeon: Eugenio Hoes, MD;  Location: [facility] OR;  Service: Open Heart Surgery;  Laterality: N/A;   APPENDECTOMY     1990s   CANNULATION FOR ECMO (EXTRACORPOREAL MEMBRANE OXYGENATION) N/A 04/26/2023   Procedure: CENTRAL CANNULATION FOR ECMO (EXTRACORPOREAL MEMBRANE OXYGENATION);  Surgeon: Eugenio Hoes, MD;  Location: [facility] OR;  Service: Open Heart Surgery;  Laterality: N/A;   COLONOSCOPY WITH ESOPHAGOGASTRODUODENOSCOPY (EGD)     2020s   CORONARY ARTERY BYPASS GRAFT N/A 04/26/2023   Procedure: CORONARY ARTERY BYPASS GRAFTING (CABG) x 2 USING RIGHT AND LEFT GREATER SAPHENOUS VEIN HARVESTED OPEN;  Surgeon: Eugenio Hoes, MD;  Location: [facility] OR;  Service: Open Heart Surgery;  Laterality: N/A;   EXPLORATION POST OPERATIVE OPEN HEART N/A 04/26/2023   Procedure: EXPLORATION POST OPERATIVE OPEN HEART;  Surgeon: Eugenio Hoes, MD;  Location: [facility] OR;  Service: Open Heart Surgery;  Laterality: N/A;   EXPLORATION POST OPERATIVE OPEN HEART N/A 04/27/2023   Procedure: EXPLORATION POST OPERATIVE OPEN HEART;  Surgeon: Eugenio Hoes, MD;  Location: [facility] OR;  Service: Open Heart Surgery;  Laterality: N/A;   EYE SURGERY Right    eye tissue removed   MOUTH SURGERY  2022   Skin Cancer Removal   REPLACEMENT ASCENDING AORTA N/A 04/26/2023   Procedure: REPLACEMENT ASCENDING AORTA USING 23 MM KONECT RESILIA AORTIC VALVED CONDUIT;  Surgeon: Eugenio Hoes, MD;  Location: [facility] OR;  Service: Open Heart Surgery;  Laterality: N/A;   RIGHT/LEFT HEART CATH AND CORONARY ANGIOGRAPHY N/A 12/28/2022   Procedure: RIGHT/LEFT HEART CATH AND CORONARY ANGIOGRAPHY;  Surgeon: Swaziland, Peter M, MD;  Location: [facility] INVASIVE CV  LAB;  Service: Cardiovascular;  Laterality: N/A;   STERIOD INJECTION  09/16/2011   Procedure: MINOR STEROID INJECTION;  Surgeon: Mat Carne, MD;  Location: [facility];  Service: Orthopedics;  Laterality: Left;  Left Lumbar Five-Sacral One Transforaminal Epidural Steroid Injection   STERNAL WOUND DEBRIDEMENT N/A 05/01/2023   Procedure: MEDIAL STERNAL WOUND DEBRIDEMENT;  Surgeon: Lovett Sox, MD;  Location: [facility] OR;  Service: Thoracic;  Laterality: N/A;   TEE WITHOUT CARDIOVERSION N/A 11/05/2020   Procedure: TRANSESOPHAGEAL ECHOCARDIOGRAM (TEE);  Surgeon: Christell Constant, MD;  Location: [facility] ENDOSCOPY;  Service: Cardiovascular;  Laterality: N/A;   TEE WITHOUT CARDIOVERSION N/A 04/26/2023   Procedure: TRANSESOPHAGEAL ECHOCARDIOGRAM;  Surgeon: Eugenio Hoes, MD;  Location: [facility] OR;  Service: Open Heart Surgery;  Laterality: N/A;   TEE WITHOUT CARDIOVERSION N/A 04/27/2023   Procedure: TRANSESOPHAGEAL ECHOCARDIOGRAM (TEE);  Surgeon: Eugenio Hoes, MD;  Location: [facility] OR;  Service: Open Heart Surgery;  Laterality: N/A;   TEE WITHOUT CARDIOVERSION N/A 05/01/2023   Procedure: TRANSESOPHAGEAL ECHOCARDIOGRAM (TEE);  Surgeon: Lovett Sox, MD;  Location: [facility] OR;  Service: Thoracic;  Laterality: N/A;    Family History  Problem Relation Age of Onset   Hypertension Mother    Diabetes Father    Migraines Neg Hx     Social History:  reports that he has quit smoking. His smoking use included cigarettes. He has never used smokeless tobacco. He reports that he does not drink alcohol and does not use drugs.  Allergies:  Allergies  Allergen Reactions   Levaquin [Levofloxacin]     unknown    Medications: I have reviewed the patient's current medications.  The PMH, PSH, Medications, Allergies, and SH were reviewed and updated.  ROS: Constitutional: Negative for fever, weight loss and weight gain. Cardiovascular: Negative for chest pain and dyspnea on  exertion. Respiratory: Is not experiencing shortness of breath at rest. Gastrointestinal: Negative for nausea and vomiting. Neurological: Negative for headaches. Psychiatric: The patient is not nervous/anxious  Blood pressure 123/77, pulse 84, height 5\' 6"  (1.676 m), weight 185 lb (83.9 kg), SpO2 95%. Body mass index is 29.86 kg/m.  PHYSICAL EXAM:  Exam: General: Well-developed, well-nourished Communication and Voice: raspy Respiratory Respiratory effort: Equal inspiration and expiration without stridor Cardiovascular Peripheral Vascular: Warm extremities with equal color/perfusion Eyes: No nystagmus with equal extraocular motion bilaterally Neuro/Psych/Balance: Patient oriented to person, place, and time; Appropriate mood and affect; Gait is intact with no imbalance; Cranial nerves I-XII are intact Head and Face Inspection: Normocephalic and atraumatic without mass or lesion Palpation: Facial skeleton intact without bony stepoffs Salivary Glands: No mass or tenderness Facial Strength: Facial motility symmetric and full bilaterally ENT Pinna: External ear intact and fully developed External canal: Canal is patent with intact skin Tympanic Membrane: Clear and mobile External Nose: No scar or anatomic deformity Internal Nose: Septum is deviated to the left. No polyp, or purulence. Mucosal edema and erythema present.  Bilateral inferior turbinate hypertrophy.  Lips, Teeth, and gums: Mucosa and teeth intact and viable TMJ: No pain to palpation with full mobility Oral cavity/oropharynx: No erythema or exudate, no lesions present Nasopharynx: No mass or lesion with intact mucosa Hypopharynx: Intact mucosa without pooling of secretions Larynx Glottic: Full true vocal cord mobility with right vocal process granuloma Supraglottic: Normal appearing epiglottis and AE folds Interarytenoid Space: Moderate pachydermia&edema Subglottic Space: Patent without lesion or edema Neck Neck and  Trachea: Midline trachea without mass or lesion Thyroid: No mass or nodularity Lymphatics: No lymphadenopathy  Procedure: Preoperative diagnosis: dysphonia and throat clearing   Postoperative diagnosis:   Same + R VF lesion (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  Indirect laryngoscopy view was incomplete. Thus it was recommended that they undergo a flexible fiberoptic laryngoscopy. All of the risks, benefits, and potential complications were reviewed with the patient preoperatively and verbal informed consent was obtained.  Procedure: The patient was seated upright in the clinic. Topical lidocaine and Afrin were applied to the nasal cavity. After adequate anesthesia had occurred, I then proceeded to pass the flexible telescope into the nasal cavity. The nasal cavity was patent without rhinorrhea or polyp. The nasopharynx was also patent without mass or lesion. The base of tongue was visualized and was normal. There were no signs of pooling of secretions in the piriform sinuses. The true vocal folds were mobile bilaterally. There were no signs of glottic or supraglottic mucosal lesion or mass, but there was a round tan smooth lesion at the right vocal process. There was moderate interarytenoid pachydermia and post cricoid edema. The telescope was then slowly withdrawn and the patient tolerated the procedure throughout.  Studies Reviewed: MBS 07/06/23 HPI: Pt presenting with dysphagia following medical complications of cardiac surgeries. Intubated x2 with prolonged NPO status. Discharged from hospital on regular textures/thin liquids. Currently consuming dysphagia 2/3 per pt preference. Reported persistent globus sensation with and without PO.  Reported hx of GERD but reports he "doesn't have it anymore" and is not taking PPI. Reported chronic pill dysphagia with preference for crushing meds. Presenting with habitual throat clearing prior to PO administration, reporting is chronic.   Clinical Impression: Clinical Impression: Johnathan Anderson was seen for Modified Barium Swallow Study to assess swallow physiology and safety. He presents with functional oropharyngeal swallow and unremarkable sweep of esophagus. He tolerated thin, mildly thick, moderately thick liquids, puree, regular solids, and small pill with thin liquids with no overt challenges noted. Oral phase demonstrating good lingual control during directed bolus hold, brisk A-P transit of boluses into pharynx, with pharyngeal swallow triggered as bolus passed base of tongue. Occasional trace amount of spillage into pyriorms with sequential sips of thin liquids. Pharyngeal phase c/b intact hyolaryngeal elevation, adequate laryngeal vestibule closure, full epiglottic inversion, and WNL pharyngeal stripping wave. Airway protection intact throughout all trials with no airway invasion demonstrated. Pt reports will take small pills whole with water, simulated this date with  barium tablet. Safe and efficient passage through pharynx, timely and complete clearance through esophagus into stomach. No complaints of globus during today's evaluation. X1 throat clear evidenced with no contrast noted in airway or residuals in pharynx. Reviewed recommendation for evaluation with ENT d/t suspected laryngopharyngeal reflux. Suggest ongoing OP ST for reducing vocally abusive behaviors and optimizing vocal quality as pt reporting change in baseline. No diet texture modifications are warranted based on today's examination.  Assessment/Plan: Encounter Diagnoses  Name Primary?   Vocal process granuloma Yes   Dysphonia    Dysphagia, unspecified type    Chronic throat clearing    Chronic GERD    Environmental and  seasonal allergies    Post-nasal drip     Assessment and Plan    Dysphonia and evidence of right vocal process lesion likely granuloma on scope exam today Likely secondary to prolonged intubation during recent open heart surgery and hospital admission, reports 2 intubations for respiratory failure during hospital stay. Symptoms include persistent throat clearing, voice changes, and sensation of something in the throat.. Management includes aggressive reflux control and potential surgical removal if medical management fails to resolve granuloma.  We discussed that many patients improve with reflux therapy alone, avoiding surgery. Surgery involves intubation, laser removal, and steroid injection, typically outpatient with minimal recovery. - Prescribe omeprazole 40 mg 30 minutes before breakfast - Prescribe famotidine 40 mg at night - Recommend dietary changes to avoid reflux triggers - Advise against late meals and lying down immediately after eating - Recommend seaweed-based supplement reflux Gourmet - Prescribe Zyrtec 10 mg daily for postnasal drainage - Prescribe Flonase nasal spray 2 puffs bilateral nares twice daily - Recommend ocean spray for nasal moisture - Schedule follow-up in three months to reassess with repeat scope exam  Dysphagia sx, choking and throat clearing  Recent MBS with intact oropharyngeal swallowing Symptoms could be related to uncontrolled GERD LPR, and we discussed that his globus sensation could be related to GERD LPR -Management of GERD  Gastroesophageal Reflux Disease (GERD) Current symptoms potentially contributing to vocal process granuloma and throat clearing. Previously took omeprazole but not on medication recently. Reflux control might alleviate throat clearing and sensation of something in the throat. - Prescribe omeprazole  40 mg 30 minutes before breakfast - Prescribe famotidine 40 mg at night - Recommend dietary changes to avoid reflux triggers -  Advise against late meals and lying down immediately after eating - Recommend seaweed-based supplement reflux Gourmet  Postnasal Drip Postnasal drainage observed on scope exam, contributing to throat clearing and sensation of mucus in the throat. Postnasal drainage might be causing throat irritation. - Prescribe Zyrtec 10 mg daily for postnasal drainage - Prescribe Flonase nasal spray 2 puffs bilateral nares twice daily - Recommend ocean spray for nasal moisture  Follow-up - Schedule follow-up in three months to reassess granuloma.          Thank you for allowing me to participate in the care of this patient. Please do not hesitate to contact me with any questions or concerns.   Ashok Croon, MD Otolaryngology [facility] Phone: [phone] Fax: [phone]    07/19/2023, 5:18 PM

## 2023-07-19 NOTE — Patient Instructions (Signed)
-   Take Reflux Gourmet (natural supplement available on [employer]) to help with symptoms of chronic throat irritation      GamingLesson.nl - check out this website to learn more about reflux   -Avoid lying down for at least two hours after a meal or after drinking acidic beverages, like soda, or other caffeinated beverages. This can help to prevent stomach contents from flowing back into the esophagus. -Keep your head elevated while you sleep. Using an extra pillow or two can also help to prevent reflux. -Eat smaller and more frequent meals each day instead of a few large meals. This promotes digestion and can aid in preventing heartburn. -Wear loose-fitting clothes to ease pressure on the stomach, which can worsen heartburn and reflux. -Reduce excess weight around the midsection. This can ease pressure on the stomach. Such pressure can force some stomach contents back up the esophagus

## 2023-07-20 ENCOUNTER — Other Ambulatory Visit: Payer: Self-pay | Admitting: Podiatry

## 2023-07-20 ENCOUNTER — Encounter: Payer: Self-pay | Admitting: Speech Pathology

## 2023-07-20 ENCOUNTER — Ambulatory Visit (INDEPENDENT_AMBULATORY_CARE_PROVIDER_SITE_OTHER): Payer: Medicare Other

## 2023-07-20 ENCOUNTER — Ambulatory Visit (INDEPENDENT_AMBULATORY_CARE_PROVIDER_SITE_OTHER): Payer: Medicare Other | Admitting: Podiatry

## 2023-07-20 DIAGNOSIS — I96 Gangrene, not elsewhere classified: Secondary | ICD-10-CM

## 2023-07-20 DIAGNOSIS — S91109A Unspecified open wound of unspecified toe(s) without damage to nail, initial encounter: Secondary | ICD-10-CM

## 2023-07-20 NOTE — Progress Notes (Signed)
  Subjective:  Patient ID: Johnathan Anderson, male    DOB: 1966-01-28,  MRN: [id number]  Chief Complaint  Patient presents with   Toe Pain    Left foot 2nd toe. Pt stated that about a month ago he was in the hospital due to open heart surgery and his to turned black he was told it was gangrene but he noticed that the blackness went away but he does have some discomfort in the ball of his foot when he steps on a hard surface.    Discussed the use of AI scribe software for clinical note transcription with the patient, who gave verbal consent to proceed.  History of Present Illness   Johnathan Anderson is a 58 year old male who presents with a healed toe ulceration following ECMO treatment.  He developed a toe ulceration during a hospital stay, initially presenting with a black appearance and described as green by medical staff on December 13th. Approximately five to six days ago, he noticed significant improvement, with the toe no longer appearing black. A small bluish area remains on the distal tip of the second toe.  He underwent ECMO treatment for an extended period, which affected blood flow to his extremities, particularly the toe. A blood clot in one of the toe's arteries was suspected, but other arteries provided sufficient blood flow to aid healing. Daily application of nitroglycerin paste was used to promote circulation.  He recently started cardiac rehabilitation and continues to walk at home, although he still feels weak. He describes the recovery process as slow but notes feeling better each day. He experiences occasional minor pains in the toe, which are considered normal during healing.          Objective:    Physical Exam   CARDIOVASCULAR: Capillary refill time adequate. EXTREMITIES: Pulses palpable in foot. SKIN: Area of ecchymosis on distal tip of second toe, no open ulceration, gangrene, or necrosis observed.       No images are attached to the encounter.    Results           Assessment:   1. Gangrene of toe of left foot (HCC)      Plan:  Patient was evaluated and treated and all questions answered.  Assessment and Plan    Post-ECMO Gangrene Healed with secondary wound care and adequate perfusion. No current indication for amputation. Noted ecchymosis at the distal tip of the second toe, but no open ulceration, gangrene, or necrosis. -Continue with activity as tolerated. -Follow up as needed if ulceration recurs.          No follow-ups on file.

## 2023-07-20 NOTE — Telephone Encounter (Signed)
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 If samples are available, dosage is correct and you can provide.

## 2023-07-21 ENCOUNTER — Encounter (HOSPITAL_COMMUNITY)
Admission: RE | Admit: 2023-07-21 | Discharge: 2023-07-21 | Disposition: A | Discharge status: Home / Self Care | Payer: Medicare Other | Source: Ambulatory Visit | Attending: Internal Medicine | Admitting: Internal Medicine

## 2023-07-21 DIAGNOSIS — Z952 Presence of prosthetic heart valve: Secondary | ICD-10-CM | POA: Diagnosis not present

## 2023-07-21 DIAGNOSIS — Z951 Presence of aortocoronary bypass graft: Secondary | ICD-10-CM

## 2023-07-24 ENCOUNTER — Ambulatory Visit: Payer: Medicare Other | Admitting: Speech Pathology

## 2023-07-24 ENCOUNTER — Encounter (HOSPITAL_COMMUNITY)
Admission: RE | Admit: 2023-07-24 | Discharge: 2023-07-24 | Disposition: A | Discharge status: Home / Self Care | Payer: Medicare Other | Source: Ambulatory Visit | Attending: Internal Medicine

## 2023-07-24 DIAGNOSIS — Z952 Presence of prosthetic heart valve: Secondary | ICD-10-CM | POA: Diagnosis not present

## 2023-07-28 ENCOUNTER — Encounter (HOSPITAL_COMMUNITY)
Admission: RE | Admit: 2023-07-28 | Discharge: 2023-07-28 | Disposition: A | Discharge status: Home / Self Care | Payer: Medicare Other | Source: Ambulatory Visit | Attending: Internal Medicine | Admitting: Internal Medicine

## 2023-07-28 DIAGNOSIS — Z952 Presence of prosthetic heart valve: Secondary | ICD-10-CM | POA: Diagnosis not present

## 2023-07-28 DIAGNOSIS — Z951 Presence of aortocoronary bypass graft: Secondary | ICD-10-CM

## 2023-07-31 ENCOUNTER — Encounter (HOSPITAL_COMMUNITY)
Admission: RE | Admit: 2023-07-31 | Discharge: 2023-07-31 | Disposition: A | Discharge status: Home / Self Care | Payer: Medicare Other | Source: Ambulatory Visit | Attending: Internal Medicine | Admitting: Internal Medicine

## 2023-07-31 ENCOUNTER — Ambulatory Visit: Payer: Medicare Other | Attending: Internal Medicine | Admitting: Internal Medicine

## 2023-07-31 VITALS — BP 110/70 | HR 85 | Ht 66.0 in | Wt 193.0 lb

## 2023-07-31 DIAGNOSIS — Z952 Presence of prosthetic heart valve: Secondary | ICD-10-CM | POA: Insufficient documentation

## 2023-07-31 DIAGNOSIS — I350 Nonrheumatic aortic (valve) stenosis: Secondary | ICD-10-CM | POA: Insufficient documentation

## 2023-07-31 DIAGNOSIS — Z951 Presence of aortocoronary bypass graft: Secondary | ICD-10-CM

## 2023-07-31 DIAGNOSIS — I7121 Aneurysm of the ascending aorta, without rupture: Secondary | ICD-10-CM | POA: Insufficient documentation

## 2023-07-31 DIAGNOSIS — Q2381 Bicuspid aortic valve: Secondary | ICD-10-CM | POA: Insufficient documentation

## 2023-07-31 NOTE — Progress Notes (Signed)
 Cardiology Office Note:  .    Date:  07/31/2023  ID:  Johnathan Anderson, DOB 1966/03/24, MRN [id number] PCP: Irven Coe, MD  [facility] Providers Cardiologist:  Christell Constant, MD     CC: Follow up Cardiac Surgery  History of Present Illness: .    Johnathan Anderson is a 58 y.o. male  with bicuspid aortic valve and severe aortic stenosis who presents for follow-up after cardiac surgery.  He has a history of bicuspid aortic valve and severe aortic stenosis with moderate aortic dilation. He underwent coronary artery bypass surgery, which was complicated by cardiac arrest and required transfer to [facility] for further management. Postoperatively, he experienced issues requiring plastic surgery and vascular.  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 He has been on this medication since February and is expected to continue for about four more months. He also takes a baby aspirin daily.  No current chest pain or breathing difficulties. He has been participating in cardiac rehabilitation for the past two weeks and finds it beneficial.  He experienced a significant recovery in his leg, which was initially thought to be at risk for amputation. He noticed his toes were 'totally healed' one morning, and subsequent evaluation confirmed there was no need for amputation.  He has a family history of aortic dissection and thoracic aortic aneurysm. His brother died suddenly, likely due to an aortic dissection related to a bicuspid aortic valve and aortic aneurysm.    Relevant histories: .  Social  2022: imaging with stable valve disease and aortic size.   2023: Moderate to severe aortic stenosis. 2024, further increase in his aortic gradients. ROS: As per HPI.   Studies Reviewed: .   Cardiac Studies & Procedures   ______________________________________________________________________________________________ CARDIAC  CATHETERIZATION  CARDIAC CATHETERIZATION 12/28/2022  Narrative Normal coronary anatomy Mildly elevated LV filling pressures. PCWP 21/20 with mean 19 mm Hg. LVEDP 23 mm Hg Mild pulmonary HTN. PAP 45/17 with mean 26 mm Hg Moderate Aortic stenosis by cath. Mean AV gradient 35 mm Hg with AVA 1.38 cm squared. Index 0.69. Cath data may underestimate severity of stenosis. Normal cardiac output 6.74 L/min, index 3.36  Plan: surgical consultation  Findings Coronary Findings Diagnostic  Dominance: Right  Left Main Vessel was injected. Vessel is normal in caliber. Vessel is angiographically normal.  Left Anterior Descending Vessel was injected. Vessel is normal in caliber. Vessel is angiographically normal.  Left Circumflex Vessel was injected. Vessel is normal in caliber. Vessel is angiographically normal.  Right Coronary Artery Vessel was injected. Vessel is normal in caliber. Vessel is angiographically normal.  Intervention  No interventions have been documented.     ECHOCARDIOGRAM  ECHOCARDIOGRAM COMPLETE 12/05/2022  Narrative ECHOCARDIOGRAM REPORT    [name] Date of Exam: 12/05/2022 Medical Rec #:  [id number]      Height:       66.0 in Accession #:    [id number]     Weight:       200.8 lb Date of Birth:  07/14/1965       BSA:          2.003 m Patient Age:    57 years       BP:           135/85 mmHg Patient Gender: M              HR:  59 bpm. Exam Location:  [address]  Procedure: 2D Echo, Cardiac Doppler, Color Doppler, 3D Echo and Strain Analysis  Indications:    Q23.1 Bicuspid aortic valve; I35.0 Nonrheumatic aortic (valve) stenosis  History:        Patient has prior history of Echocardiogram examinations, most recent 06/02/2022. Risk Factors:Hypertension and Former Smoker. Aneurysm of ascending aorta without rupture. Family history of sudden cardiac death.  Sonographer:    Cathie Beams RCS Referring Phys: [facility]  Johnathan Anderson  IMPRESSIONS   1. Left ventricular ejection fraction, by estimation, is 60 to 65%. The left ventricle has normal function. The left ventricle has no regional wall motion abnormalities. Left ventricular diastolic parameters were normal. 2. Right ventricular systolic function is normal. The right ventricular size is normal. 3. Left atrial size was moderately dilated. 4. The mitral valve is abnormal. Trivial mitral valve regurgitation. No evidence of mitral stenosis. 5. The aortic valve is bicuspid. There is severe calcifcation of the aortic valve. There is severe thickening of the aortic valve. Aortic valve regurgitation is mild. Severe aortic valve stenosis. 6. Aortic dilatation noted. There is moderate dilatation of the ascending aorta, measuring 45 mm. 7. The inferior vena cava is normal in size with greater than 50% respiratory variability, suggesting right atrial pressure of 3 mmHg.  FINDINGS Left Ventricle: Left ventricular ejection fraction, by estimation, is 60 to 65%. The left ventricle has normal function. The left ventricle has no regional wall motion abnormalities. The left ventricular internal cavity size was normal in size. There is no left ventricular hypertrophy. Left ventricular diastolic parameters were normal.  Right Ventricle: The right ventricular size is normal. No increase in right ventricular wall thickness. Right ventricular systolic function is normal.  Left Atrium: Left atrial size was moderately dilated.  Right Atrium: Right atrial size was normal in size.  Pericardium: There is no evidence of pericardial effusion.  Mitral Valve: The mitral valve is abnormal. There is mild thickening of the mitral valve leaflet(s). There is mild calcification of the mitral valve leaflet(s). Trivial mitral valve regurgitation. No evidence of mitral valve stenosis.  Tricuspid Valve: The tricuspid valve is normal in structure. Tricuspid valve regurgitation is mild . No  evidence of tricuspid stenosis.  Aortic Valve: The aortic valve is bicuspid. There is severe calcifcation of the aortic valve. There is severe thickening of the aortic valve. Aortic valve regurgitation is mild. Severe aortic stenosis is present. Aortic valve mean gradient measures 39.0 mmHg. Aortic valve peak gradient measures 65.3 mmHg. Aortic valve area, by VTI measures 0.83 cm.  Pulmonic Valve: The pulmonic valve was normal in structure. Pulmonic valve regurgitation is mild. No evidence of pulmonic stenosis.  Aorta: Aortic dilatation noted. There is moderate dilatation of the ascending aorta, measuring 45 mm.  Venous: The inferior vena cava is normal in size with greater than 50% respiratory variability, suggesting right atrial pressure of 3 mmHg.  IAS/Shunts: No atrial level shunt detected by color flow Doppler.   LEFT VENTRICLE PLAX 2D LVIDd:         4.40 cm   Diastology LVIDs:         2.60 cm   LV e' medial:    9.46 cm/s LV PW:         0.90 cm   LV E/e' medial:  9.4 LV IVS:        0.80 cm   LV e' lateral:   16.30 cm/s LVOT diam:     2.00 cm   LV E/e'  lateral: 5.5 LV SV:         83 LV SV Index:   41 LVOT Area:     3.14 cm  3D Volume EF: 3D EF:        59 % LV EDV:       125 ml LV ESV:       51 ml LV SV:        73 ml  RIGHT VENTRICLE RV Basal diam:  2.40 cm RV Mid diam:    2.60 cm RV S prime:     10.40 cm/s TAPSE (M-mode): 1.6 cm RVSP:           26.8 mmHg  LEFT ATRIUM             Index        RIGHT ATRIUM           Index LA diam:        4.30 cm 2.15 cm/m   RA Pressure: 3.00 mmHg LA Vol (A2C):   52.7 ml 26.31 ml/m  RA Area:     8.89 cm LA Vol (A4C):   38.1 ml 19.02 ml/m  RA Volume:   13.90 ml  6.94 ml/m LA Biplane Vol: 45.7 ml 22.81 ml/m AORTIC VALVE AV Area (Vmax):    0.76 cm AV Area (Vmean):   0.75 cm AV Area (VTI):     0.83 cm AV Vmax:           404.00 cm/s AV Vmean:          290.000 cm/s AV VTI:            0.990 m AV Peak Grad:      65.3 mmHg AV  Mean Grad:      39.0 mmHg LVOT Vmax:         97.40 cm/s LVOT Vmean:        69.300 cm/s LVOT VTI:          0.263 m LVOT/AV VTI ratio: 0.27  AORTA Ao Root diam: 3.00 cm Ao Asc diam:  4.50 cm  MITRAL VALVE               TRICUSPID VALVE MV Area (PHT): 2.33 cm    TR Peak grad:   23.8 mmHg MV Decel Time: 326 msec    TR Vmax:        244.00 cm/s MV E velocity: 89.10 cm/s  Estimated RAP:  3.00 mmHg MV A velocity: 79.70 cm/s  RVSP:           26.8 mmHg MV E/A ratio:  1.12 SHUNTS Systemic VTI:  0.26 m Systemic Diam: 2.00 cm  Charlton Haws MD Electronically signed by Charlton Haws MD Signature Date/Time: 12/05/2022/5:09:36 PM    Final   TEE  ECHO INTRAOPERATIVE TEE 04/27/2023  Narrative *INTRAOPERATIVE TRANSESOPHAGEAL REPORT *    [name] Date of Exam: 04/27/2023 Medical Rec #:  [id number]      Height:       66.0 in Accession #:    [id number]     Weight:       216.9 lb Date of Birth:  Jul 14, 1965       BSA:          2.07 m Patient Age:    57 years       BP:           118/93 mmHg Patient Gender: M  HR:           89 bpm. Exam Location:  Anesthesiology  Transesophogeal exam was perform intraoperatively during surgical procedure. Patient was closely monitored under general anesthesia during the entirety of examination.  Indications:     S/p aortic valve replacement, s/p VA ECMO cannulation, open chest Performing Phys: [id number] Eugenio Hoes  Complications: No known complications during this procedure. PRE-OP FINDINGS Left Ventricle: The left ventricle has mild-moderately reduced systolic function, with an ejection fraction of 40-45%. The cavity size was normal. There is hypokinesis of the anterioseptal, septal, inferoseptal, and inferior walls.    Right Ventricle: The right ventricle has moderately reduced systolic function. The cavity was normal. There is no increase in right ventricular wall thickness.  Left Atrium: Left atrial size was normal  in size. No left atrial/left atrial appendage thrombus was detected.  Right Atrium: Right atrial size was normal in size.  Interatrial Septum: No atrial level shunt detected by color flow Doppler.  Pericardium: There is no evidence of pericardial effusion.  Mitral Valve: The mitral valve is normal in structure. Mitral valve regurgitation is mild by color flow Doppler.  Tricuspid Valve: The tricuspid valve was normal in structure. Tricuspid valve regurgitation is mild by color flow Doppler.  Aortic Valve: The aortic valve has been repaired/replaced Aortic valve regurgitation was not visualized by color flow Doppler. There is a 23mm Inspiris valve in the aortic position. The valve appears well seated.    Pulmonic Valve: The pulmonic valve was normal in structure. Pulmonic valve regurgitation is not visualized by color flow Doppler.   Aorta: S/p Aortic root replacement.   Arrie Aran MD Electronically signed by Arrie Aran MD Signature Date/Time: 04/27/2023/4:12:48 PM    Final    CT SCANS  CT CORONARY MORPH W/CTA COR W/SCORE 11/30/2020  Addendum 11/30/2020  5:48 PM ADDENDUM REPORT: 11/30/2020 17:46  CLINICAL DATA:  Aortic Stenosis Assessment  EXAM: Cardiac TAVR CT  TECHNIQUE: The patient was scanned on a Siemens Force 192 slice scanner. A 120 kV retrospective scan was triggered in the descending thoracic aorta at 111 HU's. Gantry rotation speed was 270 msecs and collimation was .9 mm. No beta blockade or nitro were given. The 3D data set was reconstructed in 5% intervals of the R-R cycle. Systolic and diastolic phases were analyzed on a dedicated work station using MPR, MIP and VRT modes. The patient received 100 cc of contrast.  FINDINGS: Aortic Valve: Moderately thickened Siever's 1 bicuspid aortic valve with the planimeter valve area is 2.62 cm2  LVOT calcification: None  Annular calcification: Minimal  Aortic Valve Calcium Score: 420  Prosthetic Valve:  NA  Mitral Valve: No mitral annular calcification  Left Ventricular Function: LVEF 60% without wall motion abnormality  Aortic Annulus Measurements  Major annulus diameter: 28 mm  Minor annulus diameter: 21 mm  Annular perimeter: 76 mm  Annular area: 4.43 cm2  Aortic Root Measurements  Sinus of Valsalva perimeter: 93 mm  Sinotubular Junction: 2.5 cm  Ascending Thoracic Aorta: 45 mm  Aortic Arch: 37 mm  Descending Thoracic Aorta: 23 mm  Sinus of Valsalva Measurements:  Right coronary cusp width: 26 mm  Left coronary cusp width: 26 mm  Non coronary cusp width: 29 mm  Coronary Artery Height above Annulus:  Left Main: 11 mm  Right Coronary: 8 mm  Coronary Arteries: No nitroglycerin given for coronary artery patency assessment  Coronary Artery Calcium score:  Coronary Calcium Score:  Left main: 0  Left anterior descending artery:  0.366  Left circumflex artery: 0  Right coronary artery: 0  Total: 0.366  Percentile: 56th for age, sex, and race matched control.  Optimum Fluoroscopic Angle for Delivery if TAVR were deployed: LAO 2, CAU 2  IMPRESSION: 1. Anatomically mild aortic stenosis of a bicuspid valve. Findings pertinent to TAVR procedure are detailed above, though at this time valve team assessment is deferred.  2. Moderate ascending aortic aneurysm without evidence of concomitant coarctation of the aorta.  3. Patient's total coronary artery calcium score is 0.366, which is 56th percentile for subjects of the same age, gender, and race based populations.  RECOMMENDATIONS:  Coronary artery calcium (CAC) score is a strong predictor of incident coronary heart disease (CHD) and provides predictive information beyond traditional risk factors. CAC scoring is reasonable to use in the decision to withhold, postpone, or initiate statin therapy in intermediate-risk or selected borderline-risk asymptomatic adults (age 75-75 years and LDL-C >=70 to  <190 mg/dL) who do not have diabetes or established atherosclerotic cardiovascular disease (ASCVD).* In intermediate-risk (10-year ASCVD risk >=7.5% to <20%) adults or selected borderline-risk (10-year ASCVD risk >=5% to <7.5%) adults in whom a CAC score is measured for the purpose of making a treatment decision the following recommendations have been made:  If CAC = 0, it is reasonable to withhold statin therapy and reassess in 5 to 10 years, as long as higher risk conditions are absent (diabetes mellitus, family history of premature CHD in first degree relatives (males <55 years; females <65 years), cigarette smoking, LDL >=190 mg/dL or other independent risk factors).  If CAC is 1 to 99, it is reasonable to initiate statin therapy for patients >=38 years of age.  If CAC is >=100 or >=75th percentile, it is reasonable to initiate statin therapy at any age.  Cardiology referral should be considered for patients with CAC scores >=400 or >=75th percentile.  *2018 AHA/ACC/AACVPR/AAPA/ABC/ACPM/ADA/AGS/APhA/ASPC/NLA/PCNA Guideline on the Management of Blood Cholesterol: A Report of the American College of Cardiology/American Heart Association Task Force on Clinical Practice Guidelines. J Am Coll Cardiol. 2019;73(24):3168-3209.  Raelin Pixler   Electronically Signed By: Riley Lam MD On: 11/30/2020 17:46  Narrative EXAM: OVER-READ INTERPRETATION  CT CHEST  The following report is an over-read performed by radiologist Dr. Corlis Leak of [facility] on 11/30/2020. This over-read does not include interpretation of cardiac or coronary anatomy or pathology. The coronary CTA interpretation by the cardiologist is attached.  COMPARISON:  None.  FINDINGS: Vascular: Heart size normal. No pericardial effusion. Fair contrast opacification of central pulmonary arteries without evident filling defect to suggest acute PE. Aortic valve leaflet  coarse calcifications. 4.4 cm ascending thoracic aortic aneurysm. No aortic dissection or stenosis. 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  Mediastinum/Nodes: No mass or adenopathy.  Lungs/Pleura: No pleural effusion. No pneumothorax. 3 mm subpleural nodule, superior segment right lower lobe (Im41,Se10) . 3 mm subpleural nodule, lateral basal segment left lower lobe image 55. Lungs otherwise clear.  Upper Abdomen: See separate CTA abdomen from the same  Musculoskeletal: No chest wall mass or suspicious bone lesions identified.  IMPRESSION: Small bilateral pulmonary nodules less than 4 mm. No follow-up needed if patient is low-risk. Non-contrast chest CT can be considered in 12 months if patient is high-risk. This recommendation follows the consensus statement: Guidelines for Management of Incidental Pulmonary Nodules Detected on CT Images: From the Fleischner Society 2017; Radiology 2017; 284:228-243.  4.4 cm ascending thoracic aortic aneurysm. Recommend annual imaging followup  by CTA or MRA. This recommendation follows 2010 ACCF/AHA/AATS/ACR/ASA/SCA/SCAI/SIR/STS/SVM Guidelines for the Diagnosis and Management of Patients with Thoracic Aortic Disease. Circulation. 2010; 121: W098-J191  Electronically Signed: By: Corlis Leak M.D. On: 11/30/2020 12:43     ______________________________________________________________________________________________       Physical Exam:    VS:  BP 110/70 (BP Location: Right Arm)   Pulse 85   Ht 5\' 6"  (1.676 m)   Wt 193 lb (87.5 kg)   SpO2 96%   BMI 31.15 kg/m    Wt Readings from Last 3 Encounters:  07/31/23 193 lb (87.5 kg)  07/19/23 185 lb (83.9 kg)  07/11/23 188 lb 7.9 oz (85.5 kg)    Gen: no distress  Neck: No JVD Cardiac: No Rubs or Gallops, no murmur, RRR +2 radial pulses Respiratory: Clear to auscultation bilaterally, normal effort, normal  respiratory rate GI: Soft, nontender,  non-distended  MS: No  edema;  moves all extremities Integument: sternotomy is well healed; ecmo sites are healing Neuro:  At time of evaluation, alert and oriented to person/place/time/situation  Psych: Normal affect, patient feels well   ASSESSMENT AND PLAN: .    Bicuspid Aortic Valve with Severe Aortic Stenosis and Moderate Aortic Dilation Severe aortic stenosis and moderate aortic dilation managed post-bypass surgery with cardiac rehab and follow-up. Discussed risks of aortic dissection and need for lifelong monitoring. Bioprosthetic valve expected to last ~15 years, with potential second valve deployment at age 3 extending functionality for another 15 years. - Continue cardiac rehab - Annual echocardiogram to monitor valve function - Send note to Dr. Romona Curls at [facility]  I am over all unclear on his [facility] indication long term, I suspect it was related to his LA and Left internal jugular thrombus.  In May we will   Postoperative Plastic Surgery Issues Significant improvement and healing of toes post-vascular surgery, no amputation needed. - Continue follow-up with vascular surgery as needed  General Health Maintenance Lifelong baby aspirin and ongoing cholesterol management. - Continue baby aspirin indefinitely - Continue cholesterol management  Follow-up - Follow-up appointment in early May.  Riley Lam, MD FASE [facility] Cardiologist [facility]  [address] [place], [address] [place], [place] [address] [phone]  1:56 PM

## 2023-07-31 NOTE — Therapy (Deleted)
 OUTPATIENT SPEECH LANGUAGE PATHOLOGY TREATMENT   Patient Name: Johnathan Anderson MRN: [id number] DOB:May 05, 1966, 58 y.o., male Today's Date: 07/31/2023  PCP: Irven Coe MD REFERRING PROVIDER: Irven Coe, MD  END OF SESSION:      Past Medical History:  Diagnosis Date   Aortic aneurysm [facility])    Arthritis    Left Knee   Cancer (HCC) 2022   Skin Cancer   GERD (gastroesophageal reflux disease)    Headache    Migraines   Heart murmur    Hyperlipidemia    Hypertension    Peripheral vascular disease (HCC)    Thoracic Aortic Aneurysm   Vertigo    Past Surgical History:  Procedure Laterality Date   AORTIC VALVE REPLACEMENT N/A 04/26/2023   Procedure: AORTIC VALVE REPLACEMENT (AVR) USING 23 MM KONECT RESILIA AORTIC VALVED CONDUIT;  Surgeon: Eugenio Hoes, MD;  Location: [facility] OR;  Service: Open Heart Surgery;  Laterality: N/A;   APPENDECTOMY     1990s   CANNULATION FOR ECMO (EXTRACORPOREAL MEMBRANE OXYGENATION) N/A 04/26/2023   Procedure: CENTRAL CANNULATION FOR ECMO (EXTRACORPOREAL MEMBRANE OXYGENATION);  Surgeon: Eugenio Hoes, MD;  Location: [facility] OR;  Service: Open Heart Surgery;  Laterality: N/A;   COLONOSCOPY WITH ESOPHAGOGASTRODUODENOSCOPY (EGD)     2020s   CORONARY ARTERY BYPASS GRAFT N/A 04/26/2023   Procedure: CORONARY ARTERY BYPASS GRAFTING (CABG) x 2 USING RIGHT AND LEFT GREATER SAPHENOUS VEIN HARVESTED OPEN;  Surgeon: Eugenio Hoes, MD;  Location: [facility] OR;  Service: Open Heart Surgery;  Laterality: N/A;   EXPLORATION POST OPERATIVE OPEN HEART N/A 04/26/2023   Procedure: EXPLORATION POST OPERATIVE OPEN HEART;  Surgeon: Eugenio Hoes, MD;  Location: [facility] OR;  Service: Open Heart Surgery;  Laterality: N/A;   EXPLORATION POST OPERATIVE OPEN HEART N/A 04/27/2023   Procedure: EXPLORATION POST OPERATIVE OPEN HEART;  Surgeon: Eugenio Hoes, MD;  Location: [facility] OR;  Service: Open Heart Surgery;  Laterality: N/A;   EYE SURGERY Right    eye tissue removed   MOUTH SURGERY  2022   Skin  Cancer Removal   REPLACEMENT ASCENDING AORTA N/A 04/26/2023   Procedure: REPLACEMENT ASCENDING AORTA USING 23 MM KONECT RESILIA AORTIC VALVED CONDUIT;  Surgeon: Eugenio Hoes, MD;  Location: [facility] OR;  Service: Open Heart Surgery;  Laterality: N/A;   RIGHT/LEFT HEART CATH AND CORONARY ANGIOGRAPHY N/A 12/28/2022   Procedure: RIGHT/LEFT HEART CATH AND CORONARY ANGIOGRAPHY;  Surgeon: Swazil[place], Peter M, MD;  Location: [facility] INVASIVE CV LAB;  Service: Cardiovascular;  Laterality: N/A;   STERIOD INJECTION  09/16/2011   Procedure: MINOR STEROID INJECTION;  Surgeon: Mat Carne, MD;  Location: [facility];  Service: Orthopedics;  Laterality: Left;  Left Lumbar Five-Sacral One Transforaminal Epidural Steroid Injection   STERNAL WOUND DEBRIDEMENT N/A 05/01/2023   Procedure: MEDIAL STERNAL WOUND DEBRIDEMENT;  Surgeon: Lovett Sox, MD;  Location: [facility] OR;  Service: Thoracic;  Laterality: N/A;   TEE WITHOUT CARDIOVERSION N/A 11/05/2020   Procedure: TRANSESOPHAGEAL ECHOCARDIOGRAM (TEE);  Surgeon: Christell Constant, MD;  Location: [facility] ENDOSCOPY;  Service: Cardiovascular;  Laterality: N/A;   TEE WITHOUT CARDIOVERSION N/A 04/26/2023   Procedure: TRANSESOPHAGEAL ECHOCARDIOGRAM;  Surgeon: Eugenio Hoes, MD;  Location: [facility] OR;  Service: Open Heart Surgery;  Laterality: N/A;   TEE WITHOUT CARDIOVERSION N/A 04/27/2023   Procedure: TRANSESOPHAGEAL ECHOCARDIOGRAM (TEE);  Surgeon: Eugenio Hoes, MD;  Location: 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  Rationale for Evaluation and Treatment: Rehabilitation  SUBJECTIVE:   SUBJECTIVE STATEMENT:  Pt accompanied by: self  PERTINENT HISTORY: "Johnathan Anderson is a 58 y.o. male with bicuspid AoV with stenosis and ascending aortic aneurysm who is s/p [employer], CABG x 2 (SVG-RCA, SVG-LAD) on 11/20 at OSH c/b cardiac arrest and bleeding requiring return to OR for exploration, MTP and cannulation of central VA ECMO cannulation 11/21. He was transferred to [facility] on 11/26 for evaluation of advanced heart failure therapies and went to OR on 11/27 for VA ECMO decannulation and IABP placement.   The hospital course was significant for the following:. Hypoxic respiratory failure: Required mechanical ventilation, extubated 12/10. O2 was weaned while inpatient. Bronchoscopies were performed prn - last on 12/9.  Dysphagia: Speech and nutrition were consulted. FEEs on 12/20 cleared for soft and bite sized. DHT was placed for enteral nutrition and removed on 12/23 .Diet at time of discharge reg/thins."   PAIN: Are you having pain? Yes: NPRS scale: 6/10 Pain location: back, rib cage  Aggravating factors: Coughing, laughing Relieving factors: only when coughing  FALLS: Has patient fallen in last 6 months?  No  LIVING ENVIRONMENT: Lives with: lives with their family Lives in: House/apartment  PLOF:  Level of assistance: Independent with ADLs, Independent with IADLs Employment: On  disability since 2022  PATIENT GOALS: Improve swallow and voice  OBJECTIVE:  Note: Objective measures were completed at Evaluation unless otherwise noted. OBJECTIVE:                                                                                    TREATMENT DATE:  08/01/23:  07/11/23: Pt reported drinking more water to thin phlegm and reduced urge to clear throat overall. Intermittent throat clearing still evidenced today, with SLP re-educating and fading cues for throat clear alternatives. ST continued education and instruction SOVTE. Pt completed exercises effectively with occasional min-A, verbal cues and clinician model. ST then introduced Resonant Voice Therapy targeting gentle easy phonation and reduced vocal tension. ST guided pt through exercises, in which pt able to demo with occasional min A to optimize forward resonance. Utilized negative practice with model to aid pt awareness of correct production. Increasing awareness of forward vs back focused phonation exhibited throughout exercise trials.   07-10-23: ST reviewed MBSS results with patient and provided education on his [facility] swallow function. Utilized MBSS  as visual support. No further swallowing intervention warranted per MBSS results. Provided pt education on potential causes of globus sensation including reflux given pt's history with GERD and potential causes and symptoms of vocal cord dysfunction. Pt informed ST he frequently coughs, clears his throat. ST educated on vocal hygiene practices to prevent irritation caused by these behaviors. ST then initiated semi-occluded vocal tract exercises (SOVTE) targeting reduced muscle tension and strong breath support. Pt participated effectively and completed exercises with mod-A and a clinician model. ST plans to continue implementing SOVTE in future sessions and review strategies for cough suppression/throat clearing.  06-20-23: Provided recommendations for MBSS and ENT referral d/t complaints on  ongoing dysphagia, globus sensation, persistent phlegm, and usual throat clearing. Pt and family verbalized understanding and agreement.    PATIENT EDUCATION: Education details: see above  Person educated: Patient and Spouse Education method: Explanation Education comprehension: verbalized understanding and needs further education   ASSESSMENT:  CLINICAL IMPRESSION: Patient is a 58 y.o. M who was seen today for dysphagia and concerns regarding persistent globus sensation following medical complications of cardiac surgeries. Intubated x2 with prolonged NPO status. Discharged from hospital on regular textures/thin liquids. MBSS on 07/06/23 revealed functional oropharyngeal swallow and unremarkable sweep of esophagus. Pt tolerated thin, mildly thick, moderately thick liquids, puree, regular solids, and small pill with thin liquids with no overt challenges noted. Reported hx of GERD but is not taking PPI currently. Presented with usual throat clearing. Presents with mild hoarseness and roughness. Endorsed drastic change in voice and impaired breath support impacting clarity and intensity as conversation progresses. Pt has scheduled visit to ENT to further evaluate larynx and swallow function. Update POC pending those results. Pt would benefit from skilled ST intervention to optimize vocal hygiene and improve voicing to aid return to baseline.   OBJECTIVE IMPAIRMENTS: include voice disorder and dysphagia. These impairments are limiting patient from effectively communicating at home and in community and safety when swallowing. Factors affecting potential to achieve goals and functional outcome are co-morbidities and previous level of function. Patient will benefit from skilled SLP services to address above impairments and improve overall function.  REHAB POTENTIAL: Good   GOALS: Goals reviewed with patient? Yes  SHORT TERM GOALS: Target date: 07/18/2023  Pt will complete MBSS to evaluate current  swallow function Baseline: Goal status: MET  2.  Pt will perform recommended swallow exercises with rare min A x 2 sessions Baseline:  Goal status: DEFERRED - WNL swallow  3.  Pt will utilize recommended swallow precautions with rare min A x2 sessions  Baseline:  Goal status: DEFERRED - WNL swallow  4.  Pt will reduce throat clearing by 50% by STG date Baseline: > 7 Goal status: IN PROGRESS  5.  Pt will complete recommended voice exercises with occasional min A x2 sessions (pending ENT evaluation) Baseline:  Goal status: IN PROGRESS   LONG TERM GOALS: Target date: 09/12/2023  Pt will carryover recommended swallow exercises/precautions > 1 week with mod I   Baseline:  Goal status: DEFERRED - WNL swallow  2.  Pt will demonstrate clear voicing on structured tasks given rare min A x2 sessions  Baseline:  Goal status: IN PROGRESS  3.  Pt will carryover clear voicing in unstructured conversations given rare min A x2 sessions  Baseline:  Goal status: IN PROGRESS  4.  Pt will report improved voice and swallow functioning via PROMS (see above) by 2 pts on each  Baseline:  Goal status: IN PROGRESS  PLAN:  SLP FREQUENCY: 2x/week  SLP DURATION: 12 weeks  PLANNED INTERVENTIONS: Aspiration precaution training, Pharyngeal strengthening exercises, Diet toleration management , Trials of upgraded texture/liquids, Internal/external aids, Oral motor exercises, Functional tasks, Multimodal communication approach, SLP instruction and feedback, Compensatory strategies, Patient/family education, [phone] Treatment of speech (30 or 45 min) , and [phone] Treatment of swallowing function    Gracy Racer, CCC-SLP 07/31/2023, 10:42 AM

## 2023-07-31 NOTE — Patient Instructions (Addendum)
 Medication Instructions:  Your physician recommends that you continue on your current medications as directed. Please refer to the Current Medication list given to you today.  *If you need a refill on your cardiac medications before your next appointment, please call your pharmacy*   Lab Work: NONE If you have labs (blood work) drawn today and your tests are completely normal, you will receive your results only by: MyChart Message (if you have MyChart) OR A paper copy in the mail If you have any lab test that is abnormal or we need to change your treatment, we will call you to review the results.   Testing/Procedures: NONE   Follow-Up: At [facility], you and your health needs are our priority.  As part of our continuing mission to provide you with exceptional heart care, we have created designated Provider Care Teams.  These Care Teams include your primary Cardiologist (physician) and Advanced Practice Providers (APPs -  Physician Assistants and Nurse Practitioners) who all work together to provide you with the care you need, when you need it.   Your next appointment:   3 months  Provider:   Riley Lam, MD    1st Floor: - Lobby - Registration  - Pharmacy  - Lab - Cafe  2nd Floor: - PV Lab - Diagnostic Testing (echo, CT, nuclear med)  3rd Floor: - Vacant  4th Floor: - TCTS (cardiothoracic surgery) - AFib Clinic - Structural Heart Clinic - Vascular Surgery  - Vascular Ultrasound  5th Floor: - [facility] (general and EP) - Clinical Pharmacy for coumadin, hypertension, lipid, weight-loss medications, and med management appointments    Valet parking services will be available as well.

## 2023-08-01 ENCOUNTER — Ambulatory Visit: Payer: Medicare Other

## 2023-08-02 ENCOUNTER — Encounter: Payer: Commercial Managed Care - HMO | Admitting: Speech Pathology

## 2023-08-03 ENCOUNTER — Inpatient Hospital Stay: Payer: Medicare Other | Attending: Adult Health | Admitting: Adult Health

## 2023-08-03 ENCOUNTER — Telehealth: Payer: Self-pay | Admitting: *Deleted

## 2023-08-03 ENCOUNTER — Ambulatory Visit (INDEPENDENT_AMBULATORY_CARE_PROVIDER_SITE_OTHER): Payer: Medicare Other | Admitting: Plastic Surgery

## 2023-08-03 ENCOUNTER — Encounter: Payer: Self-pay | Admitting: Adult Health

## 2023-08-03 ENCOUNTER — Encounter: Payer: Self-pay | Admitting: Plastic Surgery

## 2023-08-03 VITALS — BP 117/63 | HR 73 | Temp 98.7°F | Resp 17 | Ht 66.0 in | Wt 192.5 lb

## 2023-08-03 VITALS — BP 114/73 | HR 83

## 2023-08-03 DIAGNOSIS — C4402 Squamous cell carcinoma of skin of lip: Secondary | ICD-10-CM

## 2023-08-03 DIAGNOSIS — Z87891 Personal history of nicotine dependence: Secondary | ICD-10-CM | POA: Diagnosis not present

## 2023-08-03 DIAGNOSIS — L89812 Pressure ulcer of head, stage 2: Secondary | ICD-10-CM | POA: Diagnosis not present

## 2023-08-03 DIAGNOSIS — Z85819 Personal history of malignant neoplasm of unspecified site of lip, oral cavity, and pharynx: Secondary | ICD-10-CM | POA: Insufficient documentation

## 2023-08-03 NOTE — Telephone Encounter (Signed)
 Per Lillard Anes, DNP, called [facility] Physician to request pt colonoscopy report. Awaiting fax

## 2023-08-03 NOTE — Assessment & Plan Note (Signed)
 Head and Neck Cancer Surveillance No signs or symptoms of recurrence reported. Last colonoscopy performed in 2022. Encouraged healthy diet with plenty of fruits and vegetables. Regular dental visits prior to hospitalization, but none since discharge. -Schedule annual follow-up visit. -Advise patient to report any new, persistent symptoms such as cough, shortness of breath, new pain, unintentional weight loss, or visible hard/swollen areas. -Resume regular dental visits when able. -Continue healthy diet and physical activity post-rehabilitation.  Post-Operative Cardiac Surgery Patient underwent valve replacement. Complications occurred requiring transfer to [facility] and use of a heart-lung machine. Currently in recovery and undergoing cardiac and speech therapy. Reports feeling a "little ball" in throat when swallowing, recently evaluated by ENT. -Continue cardiac and speech therapy. -Follow up with SLP/ENT regarding throat sensation.

## 2023-08-03 NOTE — Progress Notes (Signed)
 [facility] Cancer Follow up:    Irven Coe, MD 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, AJCC 8th Edition - Pathologic stage from 07/06/2022: Stage III (pT3, pN0, cM0) - Signed by Lonie Peak, MD on 07/06/2022 Stage prefix: Initial diagnosis Extraosseous extension: Absent   08/01/2022 - 08/26/2022 Radiation Therapy   Plan Name: HN_Low_Lip Site: Lip, Lower Technique: Electron Mode: Electron Dose Per Fraction: 2.5 Gy Prescribed Dose (Delivered / Prescribed): 50 Gy / 50 Gy Prescribed Fxs (Delivered / Prescribed): 20 / 20               CURRENT THERAPY: observation  INTERVAL HISTORY:  Discussed the use of AI scribe software for clinical note transcription with the patient, who gave verbal consent to proceed.  Johnathan Anderson 58 y.o. male with a history of cancer and recent open heart surgery, presents for a follow-up visit. He underwent valve replacement surgery in November of the previous year, which was complicated by a need for a pacemaker and a ventricular assist device. He was transferred to [facility] for further management and spent a month there. Since discharge, he has been recovering and participating in speech and cardiac rehabilitation. He reports feeling  improvement, but acknowledges he is not yet at 100%.  The patient also reports a sensation in his throat when swallowing, which was evaluated with an imaging study. He is scheduled to discuss this finding with his speech therapist.  Regarding his cancer history, the patient reports no new symptoms or changes in the past six months. He has been following a healthier diet through a program and is seeing his primary care provider regularly. He also reports having a colonoscopy in 2022.   Patient Active Problem List   Diagnosis Date Noted   Aortic valve replaced 04/26/2023   S/P AVR (aortic valve replacement) 04/26/2023   Chronic migraine without aura without status migrainosus, not intractable 09/14/2022   Essential hypertension 07/07/2022   Squamous cell carcinoma, 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   Migraine with aura and without status migrainosus, not intractable 02/19/2021   Thoracic aortic aneurysm without rupture (HCC) 10/05/2020   Severe aortic stenosis 10/05/2020   Bicuspid aortic valve 10/05/2020   Family history of aortic dissection 10/05/2020   Periodontitis 10/05/2020   Chronic back pain 02/24/2014    is allergic to levaquin [levofloxacin].  MEDICAL HISTORY: Past Medical History:  Diagnosis Date   Aortic aneurysm [facility])    Arthritis    Left Knee   Cancer (HCC) 2022   Skin Cancer   GERD (gastroesophageal reflux disease)    Headache    Migraines   Heart murmur    Hyperlipidemia    Hypertension    Peripheral vascular disease (HCC)    Thoracic Aortic Aneurysm   Vertigo     SURGICAL HISTORY: Past Surgical History:  Procedure Laterality Date   AORTIC VALVE REPLACEMENT N/A 04/26/2023   Procedure: AORTIC VALVE REPLACEMENT (AVR) USING 23 MM KONECT RESILIA AORTIC VALVED CONDUIT;  Surgeon: Eugenio Hoes, MD;  Location: [facility] OR;  Service: Open Heart Surgery;  Laterality: N/A;   APPENDECTOMY     1990s   CANNULATION FOR ECMO (EXTRACORPOREAL MEMBRANE OXYGENATION) N/A 04/26/2023   Procedure:  CENTRAL CANNULATION FOR ECMO (EXTRACORPOREAL MEMBRANE OXYGENATION);  Surgeon: Eugenio Hoes, MD;  Location: [facility] OR;  Service: Open Heart Surgery;  Laterality: N/A;   COLONOSCOPY WITH ESOPHAGOGASTRODUODENOSCOPY (EGD)     2020s   CORONARY ARTERY BYPASS GRAFT N/A 04/26/2023   Procedure: CORONARY ARTERY BYPASS GRAFTING (CABG) x 2 USING RIGHT AND LEFT GREATER SAPHENOUS VEIN HARVESTED OPEN;  Surgeon: Eugenio Hoes, MD;  Location: [facility] OR;  Service: Open Heart Surgery;  Laterality: N/A;   EXPLORATION POST OPERATIVE OPEN HEART N/A 04/26/2023   Procedure: EXPLORATION POST OPERATIVE OPEN HEART;  Surgeon: Eugenio Hoes, MD;  Location: [facility] OR;  Service: Open Heart Surgery;  Laterality: N/A;   EXPLORATION POST OPERATIVE OPEN HEART N/A 04/27/2023   Procedure: EXPLORATION POST OPERATIVE OPEN HEART;  Surgeon: Eugenio Hoes, MD;  Location: [facility] OR;  Service: Open Heart Surgery;  Laterality: N/A;   EYE SURGERY Right    eye tissue removed   MOUTH SURGERY  2022   Skin Cancer Removal   REPLACEMENT ASCENDING AORTA N/A 04/26/2023   Procedure: REPLACEMENT ASCENDING AORTA USING 23 MM KONECT RESILIA AORTIC VALVED CONDUIT;  Surgeon: Eugenio Hoes, MD;  Location: [facility] OR;  Service: Open Heart Surgery;  Laterality: N/A;   RIGHT/LEFT HEART CATH AND CORONARY ANGIOGRAPHY N/A 12/28/2022   Procedure: RIGHT/LEFT HEART CATH AND CORONARY ANGIOGRAPHY;  Surgeon: Swaziland, Peter M, MD;  Location: [facility] INVASIVE CV LAB;  Service: Cardiovascular;  Laterality: N/A;   STERIOD INJECTION  09/16/2011   Procedure: MINOR STEROID INJECTION;  Surgeon: Mat Carne, MD;  Location: [facility];  Service: Orthopedics;  Laterality: Left;  Left Lumbar Five-Sacral One Transforaminal Epidural Steroid Injection   STERNAL WOUND DEBRIDEMENT N/A 05/01/2023   Procedure: MEDIAL STERNAL WOUND DEBRIDEMENT;  Surgeon: Lovett Sox, MD;  Location: [facility] OR;  Service: Thoracic;  Laterality: N/A;   TEE WITHOUT CARDIOVERSION N/A 11/05/2020   Procedure:  TRANSESOPHAGEAL ECHOCARDIOGRAM (TEE);  Surgeon: Christell Constant, MD;  Location: [facility] ENDOSCOPY;  Service: Cardiovascular;  Laterality: N/A;   TEE WITHOUT CARDIOVERSION N/A 04/26/2023   Procedure: TRANSESOPHAGEAL ECHOCARDIOGRAM;  Surgeon: Eugenio Hoes, MD;  Location: [facility] OR;  Service: Open Heart Surgery;  Laterality: N/A;   TEE WITHOUT CARDIOVERSION N/A 04/27/2023   Procedure: TRANSESOPHAGEAL ECHOCARDIOGRAM (TEE);  Surgeon: Eugenio Hoes, MD;  Location: [facility] OR;  Service: Open Heart Surgery;  Laterality: N/A;   TEE WITHOUT CARDIOVERSION N/A 05/01/2023   Procedure: TRANSESOPHAGEAL ECHOCARDIOGRAM (TEE);  Surgeon: Lovett Sox, MD;  Location: [facility] OR;  Service: Thoracic;  Laterality: N/A;    SOCIAL HISTORY: Social History   Socioeconomic History   Marital status: Married    Spouse name: Not on file   Number of children: Not on file   Years of education: Not on file   Highest education level: Not on file  Occupational History   Not on file  Tobacco Use   Smoking status: Former    Types: Cigarettes   Smokeless tobacco: Never   Tobacco comments:    Pt quit smoking cigarettes in 2005. 1 pack of cigarettes per week.  Vaping Use   Vaping status: Never Used  Substance and Sexual Activity  Alcohol use: No   Drug use: No   Sexual activity: Yes  Other Topics Concern   Not on file  Social History Narrative   Not on file   Social Drivers of Health   Financial Resource Strain: Medium Risk (08/12/2022)   Overall Financial Resource Strain (CARDIA)    Difficulty of Paying Living Expenses: Somewhat hard  Food Insecurity: No Food Insecurity (04/27/2023)   Hunger Vital Sign    Worried About Running Out of Food in the Last Year: Never true    Ran Out of Food in the Last Year: Never true  Transportation Needs: No Transportation Needs (05/02/2023)   Received from [facility] - Transportation    In the past 12 months, has lack of transportation kept you from  medical appointments or from getting medications?: No    Lack of Transportation (Non-Medical): No  Physical Activity: Not on file  Stress: Not on file  Social Connections: Not on file  Intimate Partner Violence: Not At Risk (04/27/2023)   Humiliation, Afraid, Rape, and Kick questionnaire    Fear of Current or Ex-Partner: No    Emotionally Abused: No    Physically Abused: No    Sexually Abused: No    FAMILY HISTORY: Family History  Problem Relation Age of Onset   Hypertension Mother    Diabetes Father    Migraines Neg Hx     Review of Systems  Constitutional:  Negative for appetite change, chills, fatigue, fever and unexpected weight change.  HENT:   Negative for hearing loss, lump/mass and trouble swallowing.   Eyes:  Negative for eye problems and icterus.  Respiratory:  Negative for chest tightness, cough and shortness of breath.   Cardiovascular:  Negative for chest pain, leg swelling and palpitations.  Gastrointestinal:  Negative for abdominal distention, abdominal pain, constipation, diarrhea, nausea and vomiting.  Endocrine: Negative for hot flashes.  Genitourinary:  Negative for difficulty urinating.   Musculoskeletal:  Negative for arthralgias.  Skin:  Negative for itching and rash.  Neurological:  Negative for dizziness, extremity weakness, headaches and numbness.  Hematological:  Negative for adenopathy. Does not bruise/bleed easily.  Psychiatric/Behavioral:  Negative for depression. The patient is not nervous/anxious.       PHYSICAL EXAMINATION    Vitals:   08/03/23 1339  BP: 117/63  Pulse: 73  Resp: 17  Temp: 98.7 F (37.1 C)  SpO2: 99%    Physical Exam Constitutional:      General: He is not in acute distress.    Appearance: Normal appearance. He is not toxic-appearing.  HENT:     Head: Normocephalic and atraumatic.     Mouth/Throat:     Mouth: Mucous membranes are moist.     Pharynx: Oropharynx is clear. No oropharyngeal exudate or posterior  oropharyngeal erythema.  Eyes:     General: No scleral icterus. Cardiovascular:     Rate and Rhythm: Normal rate and regular rhythm.     Pulses: Normal pulses.     Heart sounds: Normal heart sounds.  Pulmonary:     Effort: Pulmonary effort is normal.     Breath sounds: Normal breath sounds.  Abdominal:     General: Abdomen is flat. Bowel sounds are normal. There is no distension.     Palpations: Abdomen is soft.     Tenderness: There is no abdominal tenderness.  Musculoskeletal:        General: No swelling.     Cervical back: Neck supple.  Lymphadenopathy:     Cervical: No cervical adenopathy.  Skin:    General: Skin is warm and dry.     Findings: No rash.  Neurological:     General: No focal deficit present.     Mental Status: He is alert.  Psychiatric:        Mood and Affect: Mood normal.        Behavior: Behavior normal.      ASSESSMENT and THERAPY PLAN:   Squamous cell carcinoma, lip Head and Neck Cancer Surveillance No signs or symptoms of recurrence reported. Last colonoscopy performed in 2022. Encouraged healthy diet with plenty of fruits and vegetables. Regular dental visits prior to hospitalization, but none since discharge. -Schedule annual follow-up visit. -Advise patient to report any new, persistent symptoms such as cough, shortness of breath, new pain, unintentional weight loss, or visible hard/swollen areas. -Resume regular dental visits when able. -Continue healthy diet and physical activity post-rehabilitation.  Post-Operative Cardiac Surgery Patient underwent valve replacement. Complications occurred requiring transfer to [facility] and use of a heart-lung machine. Currently in recovery and undergoing cardiac and speech therapy. Reports feeling a "little ball" in throat when swallowing, recently evaluated by ENT. -Continue cardiac and speech therapy. -Follow up with SLP/ENT regarding throat sensation.    All questions were answered. The patient  knows to call the clinic with any problems, questions or concerns. We can certainly see the patient much sooner if necessary.  Total encounter time:20 minutes*in face-to-face visit time, chart review, lab review, care coordination, order entry, and documentation of the encounter time.    Lillard Anes, NP 08/03/23 3:42 PM Medical Oncology and Hematology [facility] [address] [place], [place] [address] Tel. [phone]    Fax. [phone]  *Total Encounter Time as defined by the Centers for Medicare and Medicaid Services includes, in addition to the face-to-face time of a patient visit (documented in the note above) non-face-to-face time: obtaining and reviewing outside history, ordering and reviewing medications, tests or procedures, care coordination (communications with other health care professionals or caregivers) and documentation in the medical record.

## 2023-08-04 ENCOUNTER — Encounter (HOSPITAL_COMMUNITY)
Admission: RE | Admit: 2023-08-04 | Discharge: 2023-08-04 | Disposition: A | Discharge status: Home / Self Care | Payer: Medicare Other | Source: Ambulatory Visit | Attending: Internal Medicine | Admitting: Internal Medicine

## 2023-08-04 DIAGNOSIS — Z952 Presence of prosthetic heart valve: Secondary | ICD-10-CM

## 2023-08-04 DIAGNOSIS — Z951 Presence of aortocoronary bypass graft: Secondary | ICD-10-CM

## 2023-08-04 NOTE — Progress Notes (Signed)
 Mr. Johnathan Anderson returns today for evaluation of his posterior scalp wound.  He denies any ongoing issues and states the pain is well-controlled.  On examination the wound appears to have become smaller since I saw him last time.  There is still a 1 x 2 area of significant granulation tissue and there was a 5 mm x 5 mm area of necrotic or fibrinous exudate  I applied silver nitrate to the 1 x 2 cm area of granulation tissue.  I sharply debrided the 5 mm x 5 mm area of necrotic tissue using scissors.  The tissue removed was subcutaneous tissue.  He will continue dressing changes and return to see me in approximately 2 weeks.

## 2023-08-08 ENCOUNTER — Ambulatory Visit: Payer: Medicare Other | Attending: Family Medicine

## 2023-08-08 DIAGNOSIS — R498 Other voice and resonance disorders: Secondary | ICD-10-CM | POA: Diagnosis present

## 2023-08-08 NOTE — Patient Instructions (Signed)
 Recommendations: Relax Deep breathe  Be clear when you speak   Do not whisper or shout  Be clear when you speak  Don't strain or press our your voice

## 2023-08-08 NOTE — Progress Notes (Signed)
 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 Will bring bottle to verify dose, Disp: , Rfl:    famotidine (PEPCID) 40 MG tablet, Take 1 tablet (40 mg total) by mouth at bedtime., Disp: 30 tablet, Rfl: 3   fluticasone (FLONASE) 50 MCG/ACT nasal spray, Place 2 sprays into both nostrils daily., Disp: 16 g, Rfl: 6   omeprazole (PRILOSEC) 40 MG capsule, Take 1 capsule (40 mg total) by mouth daily. Take 30 min before breakfast, Disp: 30 capsule, Rfl: 3   potassium chloride (KLOR-CON) 20 MEQ packet, Take 20 mEq by mouth daily., Disp: , Rfl:    sodium chloride (OCEAN) 0.65 % SOLN nasal spray, Place 1 spray into both nostrils as needed., Disp: 30 mL, Rfl: 5   XARELTO 20 MG TABS tablet, Take 20 mg by mouth daily., Disp: , Rfl:   Past Medical History: Past Medical History:  Diagnosis Date   Aortic aneurysm (HCC)    Arthritis    Left Knee   Cancer (HCC) 2022   Skin Cancer   GERD (gastroesophageal reflux disease)    Headache    Migraines   Heart murmur    Hyperlipidemia    Hypertension    Peripheral vascular disease (HCC)    Thoracic Aortic Aneurysm   Vertigo     Tobacco Use: Social History   Tobacco Use   Smoking Status Former   Types: Cigarettes  Smokeless Tobacco Never  Tobacco Comments   Pt quit smoking cigarettes in 2005. 1 pack of cigarettes per week.    Labs: Review Flowsheet  More data exists      Latest Ref Rng & Units 04/27/2023 04/28/2023 04/29/2023 04/30/2023 05/01/2023  Labs for ITP Cardiac and Pulmonary Rehab  PH, Arterial 7.35 - 7.45 7.484  7.486  7.503  7.536  7.547  7.440  7.321  7.368  7.419  7.308  7.298  7.285  7.283  7.347  7.385  7.396  7.402  7.403  7.403  7.458  7.455  7.458  7.482  7.512  7.397  7.336  7.394  7.403  7.387  7.378  7.399  7.429  7.395  7.462  7.472  7.379  7.527  7.476  7.523  7.533  7.574  7.413  7.419  7.439   PCO2 arterial 32 - 48 mmHg 44.0  42.9  38.8  33.3  27.5  25.2  27.1  22.8  30.2  47.0  52.2  55.2  56.4  50.1  46.5  56.2  53.3  51.8  52.5  43.1  44.7  44.4  44.8  40.8  37.6  66.0  58.3  55.8  57.0  58.6  55.4  49.3  50.8  45.6  46.5  57.0  36.7  38.7  35.6  35.7  32.9  51.6  49.8  50.6   Bicarbonate 20.0 - 28.0 mmol/L 33.1  32.5  30.6  28.3  24.0  17.3  14.1  13.2  19.5  23.7  25.7  26.4  26.9  27.7  28.0  34.6  33.3  32.4  32.8  30.6  31.6  31.5  33.6  32.8  23.2  35.3  35.7  34.9  34.3  34.6  34.3  32.8  31.2  32.6  34.0  33.6  30.6  28.7  29.3  30.2  30.5  33.1  32.3  34.3   TCO2 22 - 32 mmol/L 34  34  32  29  25  18  15  14  22  20  25  27  28  29  29  29  36  35  34  34  32  33  33  35  34   24  37  37  37  36  36  36  34  33  34  31  35  35  32  30  30  31   32  35  34  36   Acid-base deficit 0.0 - 2.0 mmol/L 6.0  11.0  11.0  4.0  3.0  1.0  1.0  - 2.0  - -  O2 Saturation % 98  98  99  99  100  100  100  100  100  99  99  94  95  98  100  99  99  98  95  89.9  98  99  99  66.8  100  95  100  95  98  98  97  98  64.9  99  99  99  100  100  68.9  100  100  100  98  100  100  98  99  64  99     Details       Multiple values from one day are sorted in reverse-chronological order         Capillary Blood Glucose: Lab Results  Component  Value Date   GLUCAP 182 (H) 05/01/2023   GLUCAP 132 (H) 05/01/2023   GLUCAP 146 (H) 05/01/2023   GLUCAP 120 (H) 05/01/2023   GLUCAP 143 (H) 05/01/2023     Exercise Target Goals: Exercise Program Goal: Individual exercise prescription set using results from initial 6 min walk test and THRR while considering  patient's activity barriers and safety.   Exercise Prescription Goal: Initial exercise prescription builds to 30-45 minutes a day of aerobic activity, 2-3 days per week.  Home exercise guidelines will be given to patient during program as part of exercise prescription that the participant will acknowledge.  Activity Barriers & Risk Stratification:  Activity Barriers & Cardiac Risk Stratification - 07/11/23 1209       Activity Barriers & Cardiac Risk Stratification   Activity Barriers Balance Concerns;Joint Problems;Deconditioning;Assistive Device;Decreased Ventricular Function;Neck/Spine Problems;Back Problems    Cardiac Risk Stratification High             6 Minute Walk:  6 Minute Walk     Row Name 07/11/23 1308         6 Minute Walk   Phase Initial     Distance 668 feet     Walk Time 6 minutes     # of  Rest Breaks 0     MPH 1.3     METS 2.11     RPE 11     Perceived Dyspnea  0     VO2 Peak 7.4     Symptoms No     Resting HR 83 bpm     Resting BP 104/70     Resting Oxygen Saturation  100 %     Exercise Oxygen Saturation  during 6 min walk 99 %     Max Ex. HR 98 bpm     Max Ex. BP 110/68     2 Minute Post BP 106/64              Oxygen Initial Assessment:   Oxygen Re-Evaluation:   Oxygen Discharge (Final Oxygen Re-Evaluation):   Initial Exercise Prescription:  Initial Exercise Prescription - 07/11/23 1200       Date of Initial Exercise RX and Referring Provider   Date 07/11/23    Expected Discharge Date 10/04/22      Recumbant Bike   Level 1    RPM 60    Watts 20    Minutes 15    METs 2.1      NuStep   Level 1    SPM 75     Minutes 15    METs 2.1      Prescription Details   Frequency (times per week) 3    Duration Progress to 30 minutes of continuous aerobic without signs/symptoms of physical distress      Intensity   THRR 40-80% of Max Heartrate 64-129    Ratings of Perceived Exertion 11-13    Perceived Dyspnea 0-4      Progression   Progression Continue progressive overload as per policy without signs/symptoms or physical distress.      Resistance Training   Training Prescription Yes    Weight 3 lbs    Reps 10-15             Perform Capillary Blood Glucose checks as needed.  Exercise Prescription Changes:   Exercise Prescription Changes     Row Name 07/17/23 1652 08/04/23 1636           Response to Exercise   Blood Pressure (Admit) 96/64 118/68      Blood Pressure (Exercise) 132/64 112/70      Blood Pressure (Exit) 104/60 102/64      Heart Rate (Admit) 84 bpm 76 bpm      Heart Rate (Exercise) 96 bpm 111 bpm      Heart Rate (Exit) 1.73 bpm 85 bpm      Rating of Perceived Exertion (Exercise) 10 13      Perceived Dyspnea (Exercise) 0 0      Symptoms 0 0      Comments Pt first day in the [facility] ICR program Reviewed MET's, goals and home ExRx      Duration Progress to 30 minutes of  aerobic without signs/symptoms of physical distress Progress to 30 minutes of  aerobic without signs/symptoms of physical distress      Intensity THRR unchanged THRR unchanged        Progression   Progression Continue to progress workloads to maintain intensity without signs/symptoms of physical distress. Continue to progress workloads to maintain intensity without signs/symptoms of physical distress.      Average METs 1.65 2.45        Resistance Training   Training Prescription Yes Yes      Weight 3 lbs  3 lbs      Reps 10-15 10-15      Time 10 Minutes 10 Minutes        Recumbant Bike   Level 1 1      RPM 44 78      Watts 12 20      Minutes 15 15      METs 1.9 2.2        NuStep   Level 1 1       SPM 59 126      Minutes 15 15      METs 1.4 2.7               Exercise Comments:   Exercise Comments     Row Name 07/17/23 1657 08/04/23 1640         Exercise Comments Pt first day in the [facility] ICR program. Pt tolerated exercise well with an average MET level of 1.65. Pt is off to a good start and is learning his THRR, RPE and ExRx Reviewed MET's and goals. Pt tolerated exercise well with an average MET level of 2.45. Pt is feeling good and is feeling an increase in energy at home and is able to do more around the house. He feels good with his progress so far and will work on gradual increases going forward.               Exercise Goals and Review:   Exercise Goals     Row Name 07/11/23 1118             Exercise Goals   Increase Physical Activity Yes       Intervention Provide advice, education, support and counseling about physical activity/exercise needs.;Develop an individualized exercise prescription for aerobic and resistive training based on initial evaluation findings, risk stratification, comorbidities and participant's personal goals.       Expected Outcomes Short Term: Attend rehab on a regular basis to increase amount of physical activity.;Long Term: Exercising regularly at least 3-5 days a week.;Long Term: Add in home exercise to make exercise part of routine and to increase amount of physical activity.       Increase Strength and Stamina Yes       Intervention Provide advice, education, support and counseling about physical activity/exercise needs.;Develop an individualized exercise prescription for aerobic and resistive training based on initial evaluation findings, risk stratification, comorbidities and participant's personal goals.       Expected Outcomes Short Term: Increase workloads from initial exercise prescription for resistance, speed, and METs.;Short Term: Perform resistance training exercises routinely during rehab and add in resistance  training at home;Long Term: Improve cardiorespiratory fitness, muscular endurance and strength as measured by increased METs and functional capacity ( )       Able to understand and use rate of perceived exertion (RPE) scale Yes       Intervention Provide education and explanation on how to use RPE scale       Expected Outcomes Short Term: Able to use RPE daily in rehab to express subjective intensity level;Long Term:  Able to use RPE to guide intensity level when exercising independently       Knowledge and understanding of Target Heart Rate Range (THRR) Yes       Intervention Provide education and explanation of THRR including how the numbers were predicted and where they are located for reference       Expected Outcomes Short Term: Able to state/look up THRR;Long Term: Able  to use THRR to govern intensity when exercising independently;Short Term: Able to use daily as guideline for intensity in rehab       Understanding of Exercise Prescription Yes       Intervention Provide education, explanation, and written materials on patient's individual exercise prescription       Expected Outcomes Short Term: Able to explain program exercise prescription;Long Term: Able to explain home exercise prescription to exercise independently                Exercise Goals Re-Evaluation :  Exercise Goals Re-Evaluation     Row Name 07/17/23 1655 08/04/23 1638           Exercise Goal Re-Evaluation   Exercise Goals Review Increase Physical Activity;Understanding of Exercise Prescription;Increase Strength and Stamina;Knowledge and understanding of Target Heart Rate Range (THRR);Able to understand and use rate of perceived exertion (RPE) scale Increase Physical Activity;Understanding of Exercise Prescription;Increase Strength and Stamina;Knowledge and understanding of Target Heart Rate Range (THRR);Able to understand and use rate of perceived exertion (RPE) scale      Comments Pt first day in the [facility] ICR  program. Pt tolerated exercise well with an average MET level of 1.65. Pt is off to a good start and is learning his THRR, RPE and ExRx Reviewed MET's and goals. Pt tolerated exercise well with an average MET level of 2.45. Pt is feeling good and is feeling an increase in energy at home and is able to do more around the house. He feels good with his progress so far and will work on gradual increases going forward.      Expected Outcomes Will continue to monitor pt and progress workloads as tolerated without sign or symptom Will continue to monitor pt and progress workloads as tolerated without sign or symptom               Discharge Exercise Prescription (Final Exercise Prescription Changes):  Exercise Prescription Changes - 08/04/23 1636       Response to Exercise   Blood Pressure (Admit) 118/68    Blood Pressure (Exercise) 112/70    Blood Pressure (Exit) 102/64    Heart Rate (Admit) 76 bpm    Heart Rate (Exercise) 111 bpm    Heart Rate (Exit) 85 bpm    Rating of Perceived Exertion (Exercise) 13    Perceived Dyspnea (Exercise) 0    Symptoms 0    Comments Reviewed MET's, goals and home ExRx    Duration Progress to 30 minutes of  aerobic without signs/symptoms of physical distress    Intensity THRR unchanged      Progression   Progression Continue to progress workloads to maintain intensity without signs/symptoms of physical distress.    Average METs 2.45      Resistance Training   Training Prescription Yes    Weight 3 lbs    Reps 10-15    Time 10 Minutes      Recumbant Bike   Level 1    RPM 78    Watts 20    Minutes 15    METs 2.2      NuStep   Level 1    SPM 126    Minutes 15    METs 2.7             Nutrition:  Target Goals: Understanding of nutrition guidelines, daily intake of sodium 1500mg , cholesterol 200mg , calories 30% from fat and 7% or less from saturated fats, daily to have 5 or more servings  of fruits and vegetables.  Biometrics:  Pre  Biometrics - 07/11/23 1130       Pre Biometrics   Waist Circumference 42.5 inches    Hip Circumference 41.5 inches    Waist to Hip Ratio 1.02 %    Triceps Skinfold 20 mm    % Body Fat 30.4 %    Grip Strength 27 kg    Flexibility 0 in   could not reach   Single Leg Stand 4.25 seconds              Nutrition Therapy Plan and Nutrition Goals:   Nutrition Assessments:  MEDIFICTS Score Key: >=70 Need to make dietary changes  40-70 Heart Healthy Diet <= 40 Therapeutic Level Cholesterol Diet    Picture Your Plate Scores: <91 Unhealthy dietary pattern with much room for improvement. 41-50 Dietary pattern unlikely to meet recommendations for good health and room for improvement. 51-60 More healthful dietary pattern, with some room for improvement.  >60 Healthy dietary pattern, although there may be some specific behaviors that could be improved.    Nutrition Goals Re-Evaluation:   Nutrition Goals Re-Evaluation:   Nutrition Goals Discharge (Final Nutrition Goals Re-Evaluation):   Psychosocial: Target Goals: Acknowledge presence or absence of significant depression and/or stress, maximize coping skills, provide positive support system. Participant is able to verbalize types and ability to use techniques and skills needed for reducing stress and depression.  Initial Review & Psychosocial Screening:  Initial Psych Review & Screening - 07/11/23 1443       Initial Review   Current issues with Current Sleep Concerns   sleeping issues due to uncomfortable head wound     Family Dynamics   Good Support System? Yes   Pt has wife Mirta for support     Screening Interventions   Interventions Encouraged to exercise    Expected Outcomes Short Term goal: Identification and review with participant of any Quality of Life or Depression concerns found by scoring the questionnaire.;Long Term goal: The participant improves quality of Life and PHQ9 Scores as seen by post scores and/or  verbalization of changes             Quality of Life Scores:  Quality of Life - 07/11/23 1444       Quality of Life   Select Quality of Life      Quality of Life Scores   Health/Function Pre 24.8 %    Socioeconomic Pre 23.31 %    Psych/Spiritual Pre 27.71 %    Family Pre 27.6 %    GLOBAL Pre 25.44 %            Scores of 19 and below usually indicate a poorer quality of life in these areas.  A difference of  2-3 points is a clinically meaningful difference.  A difference of 2-3 points in the total score of the Quality of Life Index has been associated with significant improvement in overall quality of life, self-image, physical symptoms, and general health in studies assessing change in quality of life.  PHQ-9: Review Flowsheet       07/11/2023 07/06/2022  Depression screen PHQ 2/9  Decreased Interest 1 0  Down, Depressed, Hopeless 0 0  PHQ - 2 Score 1 0  Altered sleeping 3 -  Tired, decreased energy 1 -  Change in appetite 0 -  Feeling bad or failure about yourself  0 -  Trouble concentrating 1 -  Moving slowly or fidgety/restless 2 -  Suicidal thoughts 0 -  PHQ-9 Score 8 -  Difficult doing work/chores Somewhat difficult -   Interpretation of Total Score  Total Score Depression Severity:  1-4 = Minimal depression, 5-9 = Mild depression, 10-14 = Moderate depression, 15-19 = Moderately severe depression, 20-27 = Severe depression   Psychosocial Evaluation and Intervention:   Psychosocial Re-Evaluation:  Psychosocial Re-Evaluation     Row Name 07/18/23 1302 08/08/23 1519           Psychosocial Re-Evaluation   Current issues with Current Sleep Concerns Current Sleep Concerns      Comments Johnathan Anderson started cardiac rehab on 07/17/23. Johnathan Anderson denies being depressed and is glad to be able to attend cardiac rehab. 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 Hopefully the pressure wound will continue to heal and  Johnathan Anderson will be able to sleep better Johnathan Anderson has not voiced any increased concerns or stressors during exercise at cardiac rehab. 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      Interventions Encouraged to attend Cardiac Rehabilitation for the exercise;Relaxation education;Stress management education Encouraged to attend Cardiac Rehabilitation for the exercise;Relaxation education;Stress management education      Continue Psychosocial Services  Follow up required by staff Follow up required by staff               Psychosocial Discharge (Final Psychosocial Re-Evaluation):  Psychosocial Re-Evaluation - 08/08/23 1519       Psychosocial Re-Evaluation   Current issues with Current Sleep Concerns    Comments Johnathan Anderson has not voiced any increased concerns or stressors during exercise at cardiac rehab. 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    Interventions Encouraged to attend Cardiac Rehabilitation for the exercise;Relaxation education;Stress management education    Continue Psychosocial Services  Follow up required by staff             Vocational Rehabilitation: Provide vocational rehab assistance to qualifying candidates.   Vocational Rehab Evaluation & Intervention:  Vocational Rehab - 07/11/23 1119       Initial Vocational Rehab Evaluation & Intervention   Assessment shows need for Vocational Rehabilitation No   Pt is on disability            Education: Education Goals: Education classes will be provided on a weekly basis, covering required topics. Participant will state understanding/return demonstration of topics presented.    Education     Row Name 07/17/23 1400     Education   Cardiac Education Topics [facility]in   Select Workshops     Workshops   Educator Exercise Physiologist   Select Exercise   Exercise Workshop Environmental consultant and Fall Prevention   Instruction Review Code 1- Verbalizes Understanding   Class Start Time 1400   Class Stop Time 1445   Class Time Calculation (min) 45 min    Row Name 07/21/23 1500     Education   Cardiac Education Topics Pritikin   Licensed conveyancer Nutrition   Nutrition Overview of the Pritikin Eating Plan   Instruction Review Code 1- Verbalizes Understanding   Class Start Time 1357   Class Stop Time 1444   Class Time Calculation (min) 47 min    Row Name 07/28/23 1500     Education   Cardiac Education Topics  Pritikin   Licensed conveyancer Nutrition   Nutrition Other  Label Reading   Instruction Review Code 1- Verbalizes Understanding   Class Start Time 1358   Class Stop Time 1445   Class Time Calculation (min) 47 min    Row Name 07/31/23 1400     Education   Cardiac Education Topics [facility]ritikin   Select Core Videos     Core Videos   Educator Nurse   Select Nutrition   Nutrition Becoming a Pritikin Chef   Instruction Review Code 1- Verbalizes Understanding   Class Start Time 1401   Class Stop Time 1436   Class Time Calculation (min) 35 min    Row Name 08/04/23 1500     Education   Cardiac Education Topics [facility]rit[facility]in   Glass blower/designer Nutrition   Nutrition Workshop Fueling a Forensic psychologist   Instruction Review Code 1- Teaching laboratory technician Start Time 1400   Class Stop Time 1445   Class Time Calculation (min) 45 min            Core Videos: Exercise    Move It!  Clinical staff conducted group or individual video education with verbal and written material and guidebook.  Patient learns the recommended [facility]in exercise program. Exercise with the goal of living a long, healthy life. Some of the health benefits of exercise include controlled diabetes, healthier blood pressure levels, improved  cholesterol levels, improved heart and lung capacity, improved sleep, and better body composition. Everyone should speak with their doctor before starting or changing an exercise routine.  Biomechanical Limitations Clinical staff conducted group or individual video education with verbal and written material and guidebook.  Patient learns how biomechanical limitations can impact exercise and how we can mitigate and possibly overcome limitations to have an impactful and balanced exercise routine.  Body Composition Clinical staff conducted group or individual video education with verbal and written material and guidebook.  Patient learns that body composition (ratio of muscle mass to fat mass) is a key component to assessing overall fitness, rather than body weight alone. Increased fat mass, especially visceral belly fat, can put [place] at increased risk for metabolic syndrome, type 2 diabetes, heart disease, and even death. It is recommended to combine diet and exercise (cardiovascular and resistance training) to improve your body composition. Seek guidance from your physician and exercise physiologist before implementing an exercise routine.  Exercise Action Plan Clinical staff conducted group or individual video education with verbal and written material and guidebook.  Patient learns the recommended strategies to achieve and enjoy long-term exercise adherence, including variety, self-motivation, self-efficacy, and positive decision making. Benefits of exercise include fitness, good health, weight management, more energy, better sleep, less stress, and overall well-being.  Medical   Heart Disease Risk Reduction Clinical staff conducted group or individual video education with verbal and written material and guidebook.  Patient learns our heart is our most vital organ as it circulates oxygen, nutrients, white blood cells, and hormones throughout the entire body, and carries waste away. Data supports a  plant-based eating plan like the Pritikin Program for its effectiveness in slowing progression of and reversing heart disease. The video provides a number of recommendations to address heart disease.   Metabolic Syndrome and Belly Fat  Clinical staff conducted group or individual video education with verbal and written material and guidebook.  Patient learns what metabolic syndrome is, how it leads to heart disease, and how one can reverse it and keep it from coming back. You have metabolic syndrome if you have 3 of the following 5 criteria: abdominal obesity, high blood pressure, high triglycerides, low HDL cholesterol, and high blood sugar.  Hypertension and Heart Disease Clinical staff conducted group or individual video education with verbal and written material and guidebook.  Patient learns that high blood pressure, or hypertension, is very common in the [place]. Hypertension is largely due to excessive salt intake, but other important risk factors include being overweight, physical inactivity, drinking too much alcohol, smoking, and not eating enough potassium from fruits and vegetables. High blood pressure is a leading risk factor for heart attack, stroke, congestive heart failure, dementia, kidney failure, and premature death. Long-term effects of excessive salt intake include stiffening of the arteries and thickening of heart muscle and organ damage. Recommendations include ways to reduce hypertension and the risk of heart disease.  Diseases of Our Time - Focusing on Diabetes Clinical staff conducted group or individual video education with verbal and written material and guidebook.  Patient learns why the best way to stop diseases of our time is prevention, through food and other lifestyle changes. Medicine (such as prescription pills and surgeries) is often only a Band-Aid on the problem, not a long-term solution. Most common diseases of our time include obesity, type 2 diabetes,  hypertension, heart disease, and cancer. The Pritikin Program is recommended and has been proven to help reduce, reverse, and/or prevent the damaging effects of metabolic syndrome.  Nutrition   Overview of the Pritikin Eating Plan  Clinical staff conducted group or individual video education with verbal and written material and guidebook.  Patient learns about the Pritikin Eating Plan for disease risk reduction. The Pritikin Eating Plan emphasizes a wide variety of unrefined, minimally-processed carbohydrates, like fruits, vegetables, whole grains, and legumes. Go, Caution, and Stop food choices are explained. Plant-based and lean animal proteins are emphasized. Rationale provided for low sodium intake for blood pressure control, low added sugars for blood sugar stabilization, and low added fats and oils for coronary artery disease risk reduction and weight management.  Calorie Density  Clinical staff conducted group or individual video education with verbal and written material and guidebook.  Patient learns about calorie density and how it impacts the Pritikin Eating Plan. Knowing the characteristics of the food you choose will help you decide whether those foods will lead to weight gain or weight loss, and whether you want to consume more or less of them. Weight loss is usually a side effect of the Pritikin Eating Plan because of its focus on low calorie-dense foods.  Label Reading  Clinical staff conducted group or individual video education with verbal and written material and guidebook.  Patient learns about the Pritikin recommended label reading guidelines and corresponding recommendations regarding calorie density, added sugars, sodium content, and whole grains.  Dining Out - Part 1  Clinical staff conducted group or individual video education with verbal and written material and guidebook.  Patient learns that restaurant meals can be sabotaging because they can be so high in calories, fat,  sodium, and/or sugar. Patient learns recommended strategies on how to positively address this and avoid unhealthy pitfalls.  Facts on Fats  Clinical staff conducted group or individual video education with verbal and written material and guidebook.  Patient learns that lifestyle modifications can be just as effective, if not more so, as  many medications for lowering your risk of heart disease. A Pritikin lifestyle can help to reduce your risk of inflammation and atherosclerosis (cholesterol build-up, or plaque, in the artery walls). Lifestyle interventions such as dietary choices and physical activity address the cause of atherosclerosis. A review of the types of fats and their impact on blood cholesterol levels, along with dietary recommendations to reduce fat intake is also included.  Nutrition Action Plan  Clinical staff conducted group or individual video education with verbal and written material and guidebook.  Patient learns how to incorporate Pritikin recommendations into their lifestyle. Recommendations include planning and keeping personal health goals in mind as an important part of their success.  Healthy Mind-Set    Healthy Minds, Bodies, Hearts  Clinical staff conducted group or individual video education with verbal and written material and guidebook.  Patient learns how to identify when they are stressed. Video will discuss the impact of that stress, as well as the many benefits of stress management. Patient will also be introduced to stress management techniques. The way we think, act, and feel has an impact on our hearts.  How Our Thoughts Can Heal Our Hearts  Clinical staff conducted group or individual video education with verbal and written material and guidebook.  Patient learns that negative thoughts can cause depression and anxiety. This can result in negative lifestyle behavior and serious health problems. Cognitive behavioral therapy is an effective method to help control  our thoughts in order to change and improve our emotional outlook.  Additional Videos:  Exercise    Improving Performance  Clinical staff conducted group or individual video education with verbal and written material and guidebook.  Patient learns to use a non-linear approach by alternating intensity levels and lengths of time spent exercising to help burn more calories and lose more body fat. Cardiovascular exercise helps improve heart health, metabolism, hormonal balance, blood sugar control, and recovery from fatigue. Resistance training improves strength, endurance, balance, coordination, reaction time, metabolism, and muscle mass. Flexibility exercise improves circulation, posture, and balance. Seek guidance from your physician and exercise physiologist before implementing an exercise routine and learn your capabilities and proper form for all exercise.  Introduction to Yoga  Clinical staff conducted group or individual video education with verbal and written material and guidebook.  Patient learns about yoga, a discipline of the coming together of mind, breath, and body. The benefits of yoga include improved flexibility, improved range of motion, better posture and core strength, increased lung function, weight loss, and positive self-image. Yoga's heart health benefits include lowered blood pressure, healthier heart rate, decreased cholesterol and triglyceride levels, improved immune function, and reduced stress. Seek guidance from your physician and exercise physiologist before implementing an exercise routine and learn your capabilities and proper form for all exercise.  Medical   Aging: Enhancing Your Quality of Life  Clinical staff conducted group or individual video education with verbal and written material and guidebook.  Patient learns key strategies and recommendations to stay in good physical health and enhance quality of life, such as prevention strategies, having an advocate,  securing a Health Care Proxy and Power of Attorney, and keeping a list of medications and system for tracking them. It also discusses how to avoid risk for bone loss.  Biology of Weight Control  Clinical staff conducted group or individual video education with verbal and written material and guidebook.  Patient learns that weight gain occurs because we consume more calories than we burn (eating more, moving less).  Even if your body weight is normal, you may have higher ratios of fat compared to muscle mass. Too much body fat puts you at increased risk for cardiovascular disease, heart attack, stroke, type 2 diabetes, and obesity-related cancers. In addition to exercise, following the Pritikin Eating Plan can help reduce your risk.  Decoding Lab Results  Clinical staff conducted group or individual video education with verbal and written material and guidebook.  Patient learns that lab test reflects one measurement whose values change over time and are influenced by many factors, including medication, stress, sleep, exercise, food, hydration, pre-existing medical conditions, and more. It is recommended to use the knowledge from this video to become more involved with your lab results and evaluate your numbers to speak with your doctor.   Diseases of Our Time - Overview  Clinical staff conducted group or individual video education with verbal and written material and guidebook.  Patient learns that according to the CDC, 50% to 70% of chronic diseases (such as obesity, type 2 diabetes, elevated lipids, hypertension, and heart disease) are avoidable through lifestyle improvements including healthier food choices, listening to satiety cues, and increased physical activity.  Sleep Disorders Clinical staff conducted group or individual video education with verbal and written material and guidebook.  Patient learns how good quality and duration of sleep are important to overall health and well-being.  Patient also learns about sleep disorders and how they impact health along with recommendations to address them, including discussing with a physician.  Nutrition  Dining Out - Part 2 Clinical staff conducted group or individual video education with verbal and written material and guidebook.  Patient learns how to plan ahead and communicate in order to maximize their dining experience in a healthy and nutritious manner. Included are recommended food choices based on the type of restaurant the patient is visiting.   Fueling a Banker conducted group or individual video education with verbal and written material and guidebook.  There is a strong connection between our food choices and our health. Diseases like obesity and type 2 diabetes are very prevalent and are in large-part due to lifestyle choices. The Pritikin Eating Plan provides plenty of food and hunger-curbing satisfaction. It is easy to follow, affordable, and helps reduce health risks.  Menu Workshop  Clinical staff conducted group or individual video education with verbal and written material and guidebook.  Patient learns that restaurant meals can sabotage health goals because they are often packed with calories, fat, sodium, and sugar. Recommendations include strategies to plan ahead and to communicate with the manager, chef, or server to help order a healthier meal.  Planning Your Eating Strategy  Clinical staff conducted group or individual video education with verbal and written material and guidebook.  Patient learns about the Pritikin Eating Plan and its benefit of reducing the risk of disease. The Pritikin Eating Plan does not focus on calories. Instead, it emphasizes high-quality, nutrient-rich foods. By knowing the characteristics of the foods, we choose, we can determine their calorie density and make informed decisions.  Targeting Your Nutrition Priorities  Clinical staff conducted group or individual  video education with verbal and written material and guidebook.  Patient learns that lifestyle habits have a tremendous impact on disease risk and progression. This video provides eating and physical activity recommendations based on your personal health goals, such as reducing LDL cholesterol, losing weight, preventing or controlling type 2 diabetes, and reducing high blood pressure.  Vitamins and Minerals  Clinical  staff conducted group or individual video education with verbal and written material and guidebook.  Patient learns different ways to obtain key vitamins and minerals, including through a recommended healthy diet. It is important to discuss all supplements you take with your doctor.   Healthy Mind-Set    Smoking Cessation  Clinical staff conducted group or individual video education with verbal and written material and guidebook.  Patient learns that cigarette smoking and tobacco addiction pose a serious health risk which affects millions of people. Stopping smoking will significantly reduce the risk of heart disease, lung disease, and many forms of cancer. Recommended strategies for quitting are covered, including working with your doctor to develop a successful plan.  Culinary   Becoming a Set designer conducted group or individual video education with verbal and written material and guidebook.  Patient learns that cooking at home can be healthy, cost-effective, quick, and puts them in control. Keys to cooking healthy recipes will include looking at your recipe, assessing your equipment needs, planning ahead, making it simple, choosing cost-effective seasonal ingredients, and limiting the use of added fats, salts, and sugars.  Cooking - Breakfast and Snacks  Clinical staff conducted group or individual video education with verbal and written material and guidebook.  Patient learns how important breakfast is to satiety and nutrition through the entire day.  Recommendations include key foods to eat during breakfast to help stabilize blood sugar levels and to prevent overeating at meals later in the day. Planning ahead is also a key component.  Cooking - Educational psychologist conducted group or individual video education with verbal and written material and guidebook.  Patient learns eating strategies to improve overall health, including an approach to cook more at home. Recommendations include thinking of animal protein as a side on your plate rather than center stage and focusing instead on lower calorie dense options like vegetables, fruits, whole grains, and plant-based proteins, such as beans. Making sauces in large quantities to freeze for later and leaving the skin on your vegetables are also recommended to maximize your experience.  Cooking - Healthy Salads and Dressing Clinical staff conducted group or individual video education with verbal and written material and guidebook.  Patient learns that vegetables, fruits, whole grains, and legumes are the foundations of the Pritikin Eating Plan. Recommendations include how to incorporate each of these in flavorful and healthy salads, and how to create homemade salad dressings. Proper handling of ingredients is also covered. Cooking - Soups and [employer] - Soups and Desserts Clinical staff conducted group or individual video education with verbal and written material and guidebook.  Patient learns that Pritikin soups and desserts make for easy, nutritious, and delicious snacks and meal components that are low in sodium, fat, sugar, and calorie density, while high in vitamins, minerals, and filling fiber. Recommendations include simple and healthy ideas for soups and desserts.   Overview     The Pritikin Solution Program Overview Clinical staff conducted group or individual video education with verbal and written material and guidebook.  Patient learns that the results of the Pritikin  Program have been documented in more than 100 articles published in peer-reviewed journals, and the benefits include reducing risk factors for (and, in some cases, even reversing) high cholesterol, high blood pressure, type 2 diabetes, obesity, and more! An overview of the three key pillars of the Pritikin Program will be covered: eating well, doing regular exercise, and having a healthy mind-set.  WORKSHOPS  Exercise: Exercise Basics: Building Your Action Plan Clinical staff led group instruction and group discussion with PowerPoint presentation and patient guidebook. To enhance the learning environment the use of posters, models and videos may be added. At the conclusion of this workshop, patients will comprehend the difference between physical activity and exercise, as well as the benefits of incorporating both, into their routine. Patients will understand the FITT (Frequency, Intensity, Time, and Type) principle and how to use it to build an exercise action plan. In addition, safety concerns and other considerations for exercise and cardiac rehab will be addressed by the presenter. The purpose of this lesson is to promote a comprehensive and effective weekly exercise routine in order to improve patients' overall level of fitness.   Managing Heart Disease: Your Path to a Healthier Heart Clinical staff led group instruction and group discussion with PowerPoint presentation and patient guidebook. To enhance the learning environment the use of posters, models and videos may be added.At the conclusion of this workshop, patients will understand the anatomy and physiology of the heart. Additionally, they will understand how Pritikin's three pillars impact the risk factors, the progression, and the management of heart disease.  The purpose of this lesson is to provide a high-level overview of the heart, heart disease, and how the Pritikin lifestyle positively impacts risk factors.  Exercise  Biomechanics Clinical staff led group instruction and group discussion with PowerPoint presentation and patient guidebook. To enhance the learning environment the use of posters, models and videos may be added. Patients will learn how the structural parts of their bodies function and how these functions impact their daily activities, movement, and exercise. Patients will learn how to promote a neutral spine, learn how to manage pain, and identify ways to improve their physical movement in order to promote healthy living. The purpose of this lesson is to expose patients to common physical limitations that impact physical activity. Participants will learn practical ways to adapt and manage aches and pains, and to minimize their effect on regular exercise. Patients will learn how to maintain good posture while sitting, walking, and lifting.  Balance Training and Fall Prevention  Clinical staff led group instruction and group discussion with PowerPoint presentation and patient guidebook. To enhance the learning environment the use of posters, models and videos may be added. At the conclusion of this workshop, patients will understand the importance of their sensorimotor skills (vision, proprioception, and the vestibular system) in maintaining their ability to balance as they age. Patients will apply a variety of balancing exercises that are appropriate for their current level of function. Patients will understand the common causes for poor balance, possible solutions to these problems, and ways to modify their physical environment in order to minimize their fall risk. The purpose of this lesson is to teach patients about the importance of maintaining balance as they age and ways to minimize their risk of falling.  WORKSHOPS   Nutrition:  Fueling a Ship broker led group instruction and group discussion with PowerPoint presentation and patient guidebook. To enhance the learning  environment the use of posters, models and videos may be added. Patients will review the foundational principles of the Pritikin Eating Plan and understand what constitutes a serving size in each of the food groups. Patients will also learn Pritikin-friendly foods that are better choices when away from home and review make-ahead meal and snack options. Calorie density will be reviewed and applied to three nutrition priorities: weight maintenance, weight loss,  and weight gain. The purpose of this lesson is to reinforce (in a group setting) the key concepts around what patients are recommended to eat and how to apply these guidelines when away from home by planning and selecting Pritikin-friendly options. Patients will understand how calorie density may be adjusted for different weight management goals.  Mindful Eating  Clinical staff led group instruction and group discussion with PowerPoint presentation and patient guidebook. To enhance the learning environment the use of posters, models and videos may be added. Patients will briefly review the concepts of the Pritikin Eating Plan and the importance of low-calorie dense foods. The concept of mindful eating will be introduced as well as the importance of paying attention to internal hunger signals. Triggers for non-hunger eating and techniques for dealing with triggers will be explored. The purpose of this lesson is to provide patients with the opportunity to review the basic principles of the Pritikin Eating Plan, discuss the value of eating mindfully and how to measure internal cues of hunger and fullness using the Hunger Scale. Patients will also discuss reasons for non-hunger eating and learn strategies to use for controlling emotional eating.  Targeting Your Nutrition Priorities Clinical staff led group instruction and group discussion with PowerPoint presentation and patient guidebook. To enhance the learning environment the use of posters, models and  videos may be added. Patients will learn how to determine their genetic susceptibility to disease by reviewing their family history. Patients will gain insight into the importance of diet as part of an overall healthy lifestyle in mitigating the impact of genetics and other environmental insults. The purpose of this lesson is to provide patients with the opportunity to assess their personal nutrition priorities by looking at their family history, their own health history and current risk factors. Patients will also be able to discuss ways of prioritizing and modifying the Pritikin Eating Plan for their highest risk areas  Menu  Clinical staff led group instruction and group discussion with PowerPoint presentation and patient guidebook. To enhance the learning environment the use of posters, models and videos may be added. Using menus brought in from [employer], or printed from [employer], patients will apply the Pritikin dining out guidelines that were presented in the [employer] video. Patients will also be able to practice these guidelines in a variety of provided scenarios. The purpose of this lesson is to provide patients with the opportunity to practice hands-on learning of the Pritikin Dining Out guidelines with actual menus and practice scenarios.  Label Reading Clinical staff led group instruction and group discussion with PowerPoint presentation and patient guidebook. To enhance the learning environment the use of posters, models and videos may be added. Patients will review and discuss the Pritikin label reading guidelines presented in Pritikin's Label Reading Educational series video. Using fool labels brought in from local grocery stores and markets, patients will apply the label reading guidelines and determine if the packaged food meet the Pritikin guidelines. The purpose of this lesson is to provide patients with the opportunity to review, discuss, and practice  hands-on learning of the Pritikin Label Reading guidelines with actual packaged food labels. Cooking School  Pritikin's [employer] are designed to teach patients ways to prepare quick, simple, and affordable recipes at home. The importance of nutrition's role in chronic disease risk reduction is reflected in its emphasis in the overall Pritikin program. By learning how to prepare essential core Pritikin Eating Plan recipes, patients will increase control over  what they eat; be able to customize the flavor of foods without the use of added salt, sugar, or fat; and improve the quality of the food they consume. By learning a set of core recipes which are easily assembled, quickly prepared, and affordable, patients are more likely to prepare more healthy foods at home. These workshops focus on convenient breakfasts, simple entres, side dishes, and desserts which can be prepared with minimal effort and are consistent with nutrition recommendations for cardiovascular risk reduction. Cooking [employer] are taught by a Armed forces logistics/support/administrative officer (RD) who has been trained by the [employer]. The chef or RD has a clear understanding of the importance of minimizing - if not completely eliminating - added fat, sugar, and sodium in recipes. Throughout the series of Cooking School Workshop sessions, patients will learn about healthy ingredients and efficient methods of cooking to build confidence in their capability to prepare    Cooking School weekly topics:  Adding Flavor- Sodium-Free  Fast and Healthy Breakfasts  Powerhouse Plant-Based Proteins  Satisfying Salads and Dressings  Simple Sides and Sauces  International Cuisine-Spotlight on the [employer] Zones  Delicious Desserts  Savory Soups  [employer] - Meals in a Astronomer Appetizers and Snacks  Comforting Weekend Breakfasts  One-Pot Wonders   Fast Evening Meals  Landscape architect Your Pritikin  Plate  WORKSHOPS   Healthy Mindset (Psychosocial):  Focused Goals, Sustainable Changes Clinical staff led group instruction and group discussion with PowerPoint presentation and patient guidebook. To enhance the learning environment the use of posters, models and videos may be added. Patients will be able to apply effective goal setting strategies to establish at least one personal goal, and then take consistent, meaningful action toward that goal. They will learn to identify common barriers to achieving personal goals and develop strategies to overcome them. Patients will also gain an understanding of how our mind-set can impact our ability to achieve goals and the importance of cultivating a positive and growth-oriented mind-set. The purpose of this lesson is to provide patients with a deeper understanding of how to set and achieve personal goals, as well as the tools and strategies needed to overcome common obstacles which may arise along the way.  From Head to Heart: The Power of a Healthy Outlook  Clinical staff led group instruction and group discussion with PowerPoint presentation and patient guidebook. To enhance the learning environment the use of posters, models and videos may be added. Patients will be able to recognize and describe the impact of emotions and mood on physical health. They will discover the importance of self-care and explore self-care practices which may work for them. Patients will also learn how to utilize the 4 C's to cultivate a healthier outlook and better manage stress and challenges. The purpose of this lesson is to demonstrate to patients how a healthy outlook is an essential part of maintaining good health, especially as they continue their cardiac rehab journey.  Healthy Sleep for a Healthy Heart Clinical staff led group instruction and group discussion with PowerPoint presentation and patient guidebook. To enhance the learning environment the use of posters,  models and videos may be added. At the conclusion of this workshop, patients will be able to demonstrate knowledge of the importance of sleep to overall health, well-being, and quality of life. They will understand the symptoms of, and treatments for, common sleep disorders. Patients will also be able to identify daytime and nighttime behaviors which impact sleep,  and they will be able to apply these tools to help manage sleep-related challenges. The purpose of this lesson is to provide patients with a general overview of sleep and outline the importance of quality sleep. Patients will learn about a few of the most common sleep disorders. Patients will also be introduced to the concept of "sleep hygiene," and discover ways to self-manage certain sleeping problems through simple daily behavior changes. Finally, the workshop will motivate patients by clarifying the links between quality sleep and their goals of heart-healthy living.   Recognizing and Reducing Stress Clinical staff led group instruction and group discussion with PowerPoint presentation and patient guidebook. To enhance the learning environment the use of posters, models and videos may be added. At the conclusion of this workshop, patients will be able to understand the types of stress reactions, differentiate between acute and chronic stress, and recognize the impact that chronic stress has on their health. They will also be able to apply different coping mechanisms, such as reframing negative self-talk. Patients will have the opportunity to practice a variety of stress management techniques, such as deep abdominal breathing, progressive muscle relaxation, and/or guided imagery.  The purpose of this lesson is to educate patients on the role of stress in their lives and to provide healthy techniques for coping with it.  Learning Barriers/Preferences:  Learning Barriers/Preferences - 07/11/23 1445       Learning Barriers/Preferences   Learning  Barriers Sight   wears reading glasses   Learning Preferences Video;Written Material;Computer/Internet;Pictoral             Education Topics:  Knowledge Questionnaire Score:  Knowledge Questionnaire Score - 07/11/23 1445       Knowledge Questionnaire Score   Pre Score 21/24             Core Components/Risk Factors/Patient Goals at Admission:  Personal Goals and Risk Factors at Admission - 07/11/23 1445       Core Components/Risk Factors/Patient Goals on Admission   Hypertension Yes    Intervention Provide education on lifestyle modifcations including regular physical activity/exercise, weight management, moderate sodium restriction and increased consumption of fresh fruit, vegetables, and low fat dairy, alcohol moderation, and smoking cessation.;Monitor prescription use compliance.    Expected Outcomes Short Term: Continued assessment and intervention until BP is < 140/24mm HG in hypertensive participants. < 130/52mm HG in hypertensive participants with diabetes, heart failure or chronic kidney disease.;Long Term: Maintenance of blood pressure at goal levels.    Lipids Yes    Intervention Provide education and support for participant on nutrition & aerobic/resistive exercise along with prescribed medications to achieve LDL 70mg , HDL >40mg .    Expected Outcomes Short Term: Participant states understanding of desired cholesterol values and is compliant with medications prescribed. Participant is following exercise prescription and nutrition guidelines.;Long Term: Cholesterol controlled with medications as prescribed, with individualized exercise RX and with personalized nutrition plan. Value goals: LDL < 70mg , HDL > 40 mg.             Core Components/Risk Factors/Patient Goals Review:   Goals and Risk Factor Review     Row Name 07/18/23 1311 08/08/23 1523           Core Components/Risk Factors/Patient Goals Review   Personal Goals Review Weight  Management/Obesity;Hypertension;Lipids Weight Management/Obesity;Hypertension;Lipids      Review Johnathan Anderson started cardiac rehab on 07/17/23. Johnathan Anderson did well with exercise. VSS. Johnathan Anderson uses a cane for stabilty. Johnathan Anderson continues to do  well with exercise. VSS.  Expected Outcomes Johnathan Anderson will continue to participate in cardiac rehab for exercise, nutrition and lifestyle modifications Johnathan Anderson will continue to participate in cardiac rehab for exercise, nutrition and lifestyle modifications               Core Components/Risk Factors/Patient Goals at Discharge (Final Review):   Goals and Risk Factor Review - 08/08/23 1523       Core Components/Risk Factors/Patient Goals Review   Personal Goals Review Weight Management/Obesity;Hypertension;Lipids    Review Johnathan Anderson continues to do  well with exercise. VSS.    Expected Outcomes Johnathan Anderson will continue to participate in cardiac rehab for exercise, nutrition and lifestyle modifications             ITP Comments:  ITP Comments     Row Name 07/11/23 1115 07/18/23 1300 08/08/23 1518       ITP Comments Johnathan Magic, MD:  Medical Director.  Introduction to the [facility]/Intensive cardiac Rehab.  [facility] orientation packet reviewed with the patient. 30 Day ITP Review. Johnathan Anderson started cardiac rehab on 02/1//25. Johnathan Anderson did well with exercise. 30 Day ITP Review. Johnathan Anderson has good participation with  exercise at cardiac rehab when in attendance.              Comments: See ITP comments.Johnathan Headings RN BSN

## 2023-08-08 NOTE — Therapy (Signed)
 OUTPATIENT SPEECH LANGUAGE PATHOLOGY TREATMENT   Patient Name: Johnathan Anderson MRN: [id number] DOB:05/23/1966, 58 y.o., male Today's Date: 08/08/2023  PCP: Irven Coe MD REFERRING PROVIDER: Irven Coe, MD  END OF SESSION:  End of Session - 08/08/23 0759     Visit Number 4    Number of Visits 25    Date for SLP Re-Evaluation 09/12/23    Authorization Type [employer]    SLP Start Time 0802    SLP Stop Time  0845    SLP Time Calculation (min) 43 min    Activity Tolerance Patient tolerated treatment well                Past Medical History:  Diagnosis Date   Aortic aneurysm (HCC)    Arthritis    Left Knee   Cancer (HCC) 2022   Skin Cancer   GERD (gastroesophageal reflux disease)    Headache    Migraines   Heart murmur    Hyperlipidemia    Hypertension    Peripheral vascular disease (HCC)    Thoracic Aortic Aneurysm   Vertigo    Past Surgical History:  Procedure Laterality Date   AORTIC VALVE REPLACEMENT N/A 04/26/2023   Procedure: AORTIC VALVE REPLACEMENT (AVR) USING 23 MM KONECT RESILIA AORTIC VALVED CONDUIT;  Surgeon: Eugenio Hoes, MD;  Location: [facility] OR;  Service: Open Heart Surgery;  Laterality: N/A;   APPENDECTOMY     1990s   CANNULATION FOR ECMO (EXTRACORPOREAL MEMBRANE OXYGENATION) N/A 04/26/2023   Procedure: CENTRAL CANNULATION FOR ECMO (EXTRACORPOREAL MEMBRANE OXYGENATION);  Surgeon: Eugenio Hoes, MD;  Location: [facility] OR;  Service: Open Heart Surgery;  Laterality: N/A;   COLONOSCOPY WITH ESOPHAGOGASTRODUODENOSCOPY (EGD)     2020s   CORONARY ARTERY BYPASS GRAFT N/A 04/26/2023   Procedure: CORONARY ARTERY BYPASS GRAFTING (CABG) x 2 USING RIGHT AND LEFT GREATER SAPHENOUS VEIN HARVESTED OPEN;  Surgeon: Eugenio Hoes, MD;  Location: [facility] OR;  Service: Open Heart Surgery;  Laterality: N/A;   EXPLORATION POST OPERATIVE OPEN HEART N/A 04/26/2023   Procedure: EXPLORATION POST OPERATIVE OPEN HEART;  Surgeon: Eugenio Hoes, MD;  Location: [facility] OR;  Service: Open Heart  Surgery;  Laterality: N/A;   EXPLORATION POST OPERATIVE OPEN HEART N/A 04/27/2023   Procedure: EXPLORATION POST OPERATIVE OPEN HEART;  Surgeon: Eugenio Hoes, MD;  Location: [facility] OR;  Service: Open Heart Surgery;  Laterality: N/A;   EYE SURGERY Right    eye tissue removed   MOUTH SURGERY  2022   Skin Cancer Removal   REPLACEMENT ASCENDING AORTA N/A 04/26/2023   Procedure: REPLACEMENT ASCENDING AORTA USING 23 MM KONECT RESILIA AORTIC VALVED CONDUIT;  Surgeon: Eugenio Hoes, MD;  Location: [facility] OR;  Service: Open Heart Surgery;  Laterality: N/A;   RIGHT/LEFT HEART CATH AND CORONARY ANGIOGRAPHY N/A 12/28/2022   Procedure: RIGHT/LEFT HEART CATH AND CORONARY ANGIOGRAPHY;  Surgeon: Swaz[place], Peter M, MD;  Location: [facility] INVASIVE CV LAB;  Service: Cardiovascular;  Laterality: N/A;   STERIOD INJECTION  09/16/2011   Procedure: MINOR STEROID INJECTION;  Surgeon: Mat Carne, MD;  Location: [facility];  Service: Orthopedics;  Laterality: Left;  Left Lumbar Five-Sacral One Transforaminal Epidural Steroid Injection   STERNAL WOUND DEBRIDEMENT N/A 05/01/2023   Procedure: MEDIAL STERNAL WOUND DEBRIDEMENT;  Surgeon: Lovett Sox, MD;  Location: [facility] OR;  Service: Thoracic;  Laterality: N/A;   TEE WITHOUT CARDIOVERSION N/A 11/05/2020   Procedure: TRANSESOPHAGEAL ECHOCARDIOGRAM (TEE);  Surgeon: Christell Constant, MD;  Location: [facility] ENDOSCOPY;  Service: Cardiovascular;  Laterality: N/A;  TEE WITHOUT CARDIOVERSION N/A 04/26/2023   Procedure: TRANSESOPHAGEAL ECHOCARDIOGRAM;  Surgeon: Eugenio Hoes, MD;  Location: [facility] OR;  Service: Open Heart Surgery;  Laterality: N/A;   TEE WITHOUT CARDIOVERSION N/A 04/27/2023   Procedure: TRANSESOPHAGEAL ECHOCARDIOGRAM (TEE);  Surgeon: Eugenio Hoes, MD;  Location: [facility] OR;  Service: Open Heart Surgery;  Laterality: N/A;   TEE WITHOUT CARDIOVERSION N/A 05/01/2023   Procedure: TRANSESOPHAGEAL ECHOCARDIOGRAM (TEE);  Surgeon: Lovett Sox, MD;  Location: [facility] OR;   Service: Thoracic;  Laterality: N/A;   Patient Active Problem List   Diagnosis Date Noted   Aortic valve replaced 04/26/2023   S/P AVR (aortic valve replacement) 04/26/2023   Chronic migraine without aura without status migrainosus, not intractable 09/14/2022   Essential hypertension 07/07/2022   Squamous cell carcinoma, 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   Migraine with aura and without status migrainosus, not intractable 02/19/2021   Thoracic aortic aneurysm without rupture (HCC) 10/05/2020   Severe aortic stenosis 10/05/2020   Bicuspid aortic valve 10/05/2020   Family history of aortic dissection 10/05/2020   Periodontitis 10/05/2020   Chronic back pain 02/24/2014    ONSET DATE: 06/19/23 (referral date)   REFERRING DIAG: R13.10 (ICD-10-CM) - Dysphagia, unspecified  THERAPY DIAG: Other voice and resonance disorders  Rationale for Evaluation and Treatment: Rehabilitation  SUBJECTIVE:   SUBJECTIVE STATEMENT: Returns after ~one month since last ST session d/t pt requesting schedule change. ENT identified right vocal fold lesion since last session.  Pt accompanied by: self  PERTINENT HISTORY: "Johnathan Anderson is a 58 y.o. male with bicuspid AoV with stenosis and ascending aortic aneurysm who is s/p [employer], CABG x 2 (SVG-RCA, SVG-LAD) on 11/20 at OSH c/b cardiac arrest and bleeding requiring return to OR for exploration, MTP and cannulation of central VA ECMO cannulation 11/21. He was transferred to [facility] on 11/26 for evaluation of advanced heart failure therapies and went to OR on 11/27 for VA ECMO decannulation and IABP placement.   The hospital course was significant for the following:. Hypoxic respiratory failure: Required mechanical ventilation, extubated 12/10. O2 was weaned while inpatient. Bronchoscopies were performed prn - last on 12/9.  Dysphagia: Speech and nutrition were consulted. FEEs on 12/20 cleared for soft and bite sized. DHT was placed for enteral nutrition and removed on 12/23 .Diet  at time of discharge reg/thins."   PAIN: Are you having pain? Yes: NPRS scale: 6/10 Pain location: back, rib cage  Aggravating factors: Coughing, laughing Relieving factors: only when coughing  FALLS: Has patient fallen in last 6 months?  No  LIVING ENVIRONMENT: Lives with: lives with their family Lives in: House/apartment  PLOF:  Level of assistance: Independent with ADLs, Independent with IADLs Employment: On disability since 2022  PATIENT GOALS: Improve swallow and voice  OBJECTIVE:  Note: Objective measures were completed at Evaluation unless otherwise noted.  DIAGNOSTIC RESULTS:  Procedure: The nasal cavity was patent without rhinorrhea or polyp. The nasopharynx was also patent without mass or lesion. The base of tongue was visualized and was normal. There were no signs of pooling of secretions in the piriform sinuses. The true vocal folds were mobile bilaterally. There were no signs of glottic or supraglottic mucosal lesion or mass, but there was a round tan smooth lesion at the right vocal process. There was moderate interarytenoid pachydermia and post cricoid edema.   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 Symptoms  include persistent throat clearing, voice changes, and sensation of something in the throat.. Management includes aggressive reflux control and potential surgical removal if medical management fails to resolve granuloma.  We discussed that many patients improve with reflux therapy alone, avoiding surgery. Surgery involves intubation, laser removal, and steroid injection, typically outpatient with minimal recovery. - Prescribe omeprazole 40 mg 30 minutes before breakfast - Prescribe famotidine 40 mg at night - Recommend dietary changes to avoid reflux triggers - Advise  against late meals and lying down immediately after eating - Recommend seaweed-based supplement reflux Gourmet - Prescribe Zyrtec 10 mg daily for postnasal drainage - Prescribe Flonase nasal spray 2 puffs bilateral nares twice daily - Recommend ocean spray for nasal moisture - Schedule follow-up in three months to reassess with repeat scope exam                                                                                     TREATMENT DATE:  08/08/23: Provided extensive education re: recent ENT observations and recommendations in light of right VF lesion dx (see above). Pt reported benefit of SLP education and explanation to aid understanding of dx and tx options. Inconsistent HEP (2-3 days/week) reported since last session. Re-educated SOVTE and clear phonation to optimize voice. Able to complete exercises with occasional mod fading to min A. Able to achieve optimal voicing at sentence level given occasional fading to rare min A.   07/11/23: Pt reported drinking more water to thin phlegm and reduced urge to clear throat overall. Intermittent throat clearing still evidenced today, with SLP re-educating and fading cues for throat clear alternatives. ST continued education and instruction SOVTE. Pt completed exercises effectively with occasional min-A, verbal cues and clinician model. ST then introduced Resonant Voice Therapy targeting gentle easy phonation and reduced vocal tension. ST guided pt through exercises, in which pt able to demo with occasional min A to optimize forward resonance. Utilized negative practice with model to aid pt awareness of correct production. Increasing awareness of forward vs back focused phonation exhibited throughout exercise trials.   07-10-23: ST reviewed MBSS results with patient and provided education on his [facility] swallow function. Utilized MBSS as visual support. No further swallowing intervention warranted per MBSS results. Provided pt education on potential causes of  globus sensation including reflux given pt's history with GERD and potential causes and symptoms of vocal cord dysfunction. Pt informed ST he frequently coughs, clears his throat. ST educated on vocal hygiene practices to prevent irritation caused by these behaviors. ST then initiated semi-occluded vocal tract exercises (SOVTE) targeting reduced muscle tension and strong breath support. Pt participated effectively and completed exercises with mod-A and a clinician model. ST plans to continue implementing SOVTE in future sessions and review strategies for cough suppression/throat clearing.  06-20-23: Provided recommendations for MBSS and ENT referral d/t complaints on ongoing dysphagia, globus sensation, persistent phlegm, and usual throat clearing. Pt and family verbalized understanding and agreement.    PATIENT EDUCATION: Education details: see above  Person educated: Patient and Spouse Education method: Explanation Education comprehension: verbalized understanding and needs further education   ASSESSMENT:  CLINICAL IMPRESSION: Patient is a 58 y.o. M who was  seen today for dysphagia and concerns regarding persistent globus sensation following medical complications of cardiac surgeries. Intubated x2 with prolonged NPO status. Discharged from hospital on regular textures/thin liquids. MBSS on 07/06/23 revealed functional oropharyngeal swallow and unremarkable sweep of esophagus. Presents with mild hoarseness and roughness. Continued ongoing education and instruction of voice exercises and vocal hygiene practices to optimize voice in light of recently dx right VF lesion. Pt would benefit from skilled ST intervention to optimize vocal hygiene and improve voicing to aid return to baseline.   OBJECTIVE IMPAIRMENTS: include voice disorder and dysphagia. These impairments are limiting patient from effectively communicating at home and in community and safety when swallowing. Factors affecting potential to  achieve goals and functional outcome are co-morbidities and previous level of function. Patient will benefit from skilled SLP services to address above impairments and improve overall function.  REHAB POTENTIAL: Good   GOALS: Goals reviewed with patient? Yes  SHORT TERM GOALS: Target date: 07/18/2023  Pt will complete MBSS to evaluate current swallow function Baseline: Goal status: MET  2.  Pt will perform recommended swallow exercises with rare min A x 2 sessions Baseline:  Goal status: DEFERRED - WNL swallow  3.  Pt will utilize recommended swallow precautions with rare min A x2 sessions  Baseline:  Goal status: DEFERRED - WNL swallow  4.  Pt will reduce throat clearing by 50% by STG date Baseline: > 7 Goal status: NOT MET  5.  Pt will complete recommended voice exercises with occasional min A x2 sessions (pending ENT evaluation) Baseline:  Goal status: PARTIALLY MET   LONG TERM GOALS: Target date: 09/12/2023  Pt will carryover recommended swallow exercises/precautions > 1 week with mod I   Baseline:  Goal status: DEFERRED - WNL swallow  2.  Pt will demonstrate clear voicing on structured tasks given rare min A x2 sessions  Baseline:  Goal status: IN PROGRESS  3.  Pt will carryover clear voicing in unstructured conversations given rare min A x2 sessions  Baseline:  Goal status: IN PROGRESS  4.  Pt will report improved voice and swallow functioning via PROMS (see above) by 2 pts on each  Baseline:  Goal status: IN PROGRESS   PLAN:  SLP FREQUENCY: 2x/week  SLP DURATION: 12 weeks  PLANNED INTERVENTIONS: Aspiration precaution training, Pharyngeal strengthening exercises, Diet toleration management , Trials of upgraded texture/liquids, Internal/external aids, Oral motor exercises, Functional tasks, Multimodal communication approach, SLP instruction and feedback, Compensatory strategies, Patient/family education, [phone] Treatment of speech (30 or 45 min) , and [id number]  Treatment of swallowing function    Gracy Racer, CCC-SLP 08/08/2023, 8:00 AM

## 2023-08-09 ENCOUNTER — Encounter: Payer: Commercial Managed Care - HMO | Admitting: Speech Pathology

## 2023-08-09 ENCOUNTER — Encounter (HOSPITAL_COMMUNITY)
Admission: RE | Admit: 2023-08-09 | Discharge: 2023-08-09 | Disposition: A | Discharge status: Home / Self Care | Source: Ambulatory Visit | Attending: Internal Medicine | Admitting: Internal Medicine

## 2023-08-09 DIAGNOSIS — Z952 Presence of prosthetic heart valve: Secondary | ICD-10-CM | POA: Insufficient documentation

## 2023-08-09 DIAGNOSIS — Z951 Presence of aortocoronary bypass graft: Secondary | ICD-10-CM | POA: Diagnosis present

## 2023-08-09 DIAGNOSIS — Z48812 Encounter for surgical aftercare following surgery on the circulatory system: Secondary | ICD-10-CM | POA: Diagnosis not present

## 2023-08-10 ENCOUNTER — Ambulatory Visit: Payer: Medicare Other | Admitting: Speech Pathology

## 2023-08-11 ENCOUNTER — Encounter (HOSPITAL_COMMUNITY)
Admission: RE | Admit: 2023-08-11 | Discharge: 2023-08-11 | Disposition: A | Discharge status: Home / Self Care | Payer: Medicare Other | Source: Ambulatory Visit | Attending: Internal Medicine | Admitting: Internal Medicine

## 2023-08-11 DIAGNOSIS — Z951 Presence of aortocoronary bypass graft: Secondary | ICD-10-CM

## 2023-08-11 DIAGNOSIS — Z952 Presence of prosthetic heart valve: Secondary | ICD-10-CM

## 2023-08-14 ENCOUNTER — Encounter (HOSPITAL_COMMUNITY)
Admission: RE | Admit: 2023-08-14 | Discharge: 2023-08-14 | Disposition: A | Discharge status: Home / Self Care | Source: Ambulatory Visit | Attending: Internal Medicine | Admitting: Internal Medicine

## 2023-08-14 DIAGNOSIS — Z951 Presence of aortocoronary bypass graft: Secondary | ICD-10-CM

## 2023-08-14 DIAGNOSIS — Z952 Presence of prosthetic heart valve: Secondary | ICD-10-CM

## 2023-08-14 NOTE — Therapy (Unsigned)
 OUTPATIENT SPEECH LANGUAGE PATHOLOGY TREATMENT   Patient Name: Johnathan Anderson MRN: [id number] DOB:08-17-1965, 58 y.o., male Today's Date: 08/15/2023  PCP: Irven Coe MD REFERRING PROVIDER: Irven Coe, MD  END OF SESSION:  End of Session - 08/15/23 0841     Visit Number 5    Number of Visits 25    Date for SLP Re-Evaluation 09/12/23    Authorization Type [employer]    SLP Start Time 0845    SLP Stop Time  0930    SLP Time Calculation (min) 45 min    Activity Tolerance Patient tolerated treatment well                 Past Medical History:  Diagnosis Date   Aortic aneurysm (HCC)    Arthritis    Left Knee   Cancer (HCC) 2022   Skin Cancer   GERD (gastroesophageal reflux disease)    Headache    Migraines   Heart murmur    Hyperlipidemia    Hypertension    Peripheral vascular disease (HCC)    Thoracic Aortic Aneurysm   Vertigo    Past Surgical History:  Procedure Laterality Date   AORTIC VALVE REPLACEMENT N/A 04/26/2023   Procedure: AORTIC VALVE REPLACEMENT (AVR) USING 23 MM KONECT RESILIA AORTIC VALVED CONDUIT;  Surgeon: Eugenio Hoes, MD;  Location: [facility] OR;  Service: Open Heart Surgery;  Laterality: N/A;   APPENDECTOMY     1990s   CANNULATION FOR ECMO (EXTRACORPOREAL MEMBRANE OXYGENATION) N/A 04/26/2023   Procedure: CENTRAL CANNULATION FOR ECMO (EXTRACORPOREAL MEMBRANE OXYGENATION);  Surgeon: Eugenio Hoes, MD;  Location: [facility] OR;  Service: Open Heart Surgery;  Laterality: N/A;   COLONOSCOPY WITH ESOPHAGOGASTRODUODENOSCOPY (EGD)     2020s   CORONARY ARTERY BYPASS GRAFT N/A 04/26/2023   Procedure: CORONARY ARTERY BYPASS GRAFTING (CABG) x 2 USING RIGHT AND LEFT GREATER SAPHENOUS VEIN HARVESTED OPEN;  Surgeon: Eugenio Hoes, MD;  Location: [facility] OR;  Service: Open Heart Surgery;  Laterality: N/A;   EXPLORATION POST OPERATIVE OPEN HEART N/A 04/26/2023   Procedure: EXPLORATION POST OPERATIVE OPEN HEART;  Surgeon: Eugenio Hoes, MD;  Location: [facility] OR;  Service: Open Heart  Surgery;  Laterality: N/A;   EXPLORATION POST OPERATIVE OPEN HEART N/A 04/27/2023   Procedure: EXPLORATION POST OPERATIVE OPEN HEART;  Surgeon: Eugenio Hoes, MD;  Location: [facility] OR;  Service: Open Heart Surgery;  Laterality: N/A;   EYE SURGERY Right    eye tissue removed   MOUTH SURGERY  2022   Skin Cancer Removal   REPLACEMENT ASCENDING AORTA N/A 04/26/2023   Procedure: REPLACEMENT ASCENDING AORTA USING 23 MM KONECT RESILIA AORTIC VALVED CONDUIT;  Surgeon: Eugenio Hoes, MD;  Location: [facility] OR;  Service: Open Heart Surgery;  Laterality: N/A;   RIGHT/LEFT HEART CATH AND CORONARY ANGIOGRAPHY N/A 12/28/2022   Procedure: RIGHT/LEFT HEART CATH AND CORONARY ANGIOGRAPHY;  Surgeon: Swazil[place], Peter M, MD;  Location: [facility] INVASIVE CV LAB;  Service: Cardiovascular;  Laterality: N/A;   STERIOD INJECTION  09/16/2011   Procedure: MINOR STEROID INJECTION;  Surgeon: Mat Carne, MD;  Location: [facility];  Service: Orthopedics;  Laterality: Left;  Left Lumbar Five-Sacral One Transforaminal Epidural Steroid Injection   STERNAL WOUND DEBRIDEMENT N/A 05/01/2023   Procedure: MEDIAL STERNAL WOUND DEBRIDEMENT;  Surgeon: Lovett Sox, MD;  Location: [facility] OR;  Service: Thoracic;  Laterality: N/A;   TEE WITHOUT CARDIOVERSION N/A 11/05/2020   Procedure: TRANSESOPHAGEAL ECHOCARDIOGRAM (TEE);  Surgeon: Christell Constant, MD;  Location: [facility] ENDOSCOPY;  Service: Cardiovascular;  Laterality:  N/A;   TEE WITHOUT CARDIOVERSION N/A 04/26/2023   Procedure: TRANSESOPHAGEAL ECHOCARDIOGRAM;  Surgeon: Eugenio Hoes, MD;  Location: [facility] OR;  Service: Open Heart Surgery;  Laterality: N/A;   TEE WITHOUT CARDIOVERSION N/A 04/27/2023   Procedure: TRANSESOPHAGEAL ECHOCARDIOGRAM (TEE);  Surgeon: Eugenio Hoes, MD;  Location: [facility] OR;  Service: Open Heart Surgery;  Laterality: N/A;   TEE WITHOUT CARDIOVERSION N/A 05/01/2023   Procedure: TRANSESOPHAGEAL ECHOCARDIOGRAM (TEE);  Surgeon: Lovett Sox, MD;  Location: [facility] OR;   Service: Thoracic;  Laterality: N/A;   Patient Active Problem List   Diagnosis Date Noted   Aortic valve replaced 04/26/2023   S/P AVR (aortic valve replacement) 04/26/2023   Chronic migraine without aura without status migrainosus, not intractable 09/14/2022   Essential hypertension 07/07/2022   Squamous cell carcinoma, 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   Migraine with aura and without status migrainosus, not intractable 02/19/2021   Thoracic aortic aneurysm without rupture (HCC) 10/05/2020   Severe aortic stenosis 10/05/2020   Bicuspid aortic valve 10/05/2020   Family history of aortic dissection 10/05/2020   Periodontitis 10/05/2020   Chronic back pain 02/24/2014    ONSET DATE: 06/19/23 (referral date)   REFERRING DIAG: R13.10 (ICD-10-CM) - Dysphagia, unspecified  THERAPY DIAG: Other voice and resonance disorders  Rationale for Evaluation and Treatment: Rehabilitation  SUBJECTIVE:   SUBJECTIVE STATEMENT: Entered with own straw of slightly smaller diameter, with reported mild increased difficulty. Completing HEP as recommended.  Pt accompanied by: self  PERTINENT HISTORY: "Johnathan Anderson is a 58 y.o. male with bicuspid AoV with stenosis and ascending aortic aneurysm who is s/p [employer], CABG x 2 (SVG-RCA, SVG-LAD) on 11/20 at OSH c/b cardiac arrest and bleeding requiring return to OR for exploration, MTP and cannulation of central VA ECMO cannulation 11/21. He was transferred to [facility] on 11/26 for evaluation of advanced heart failure therapies and went to OR on 11/27 for VA ECMO decannulation and IABP placement.   The hospital course was significant for the following:. Hypoxic respiratory failure: Required mechanical ventilation, extubated 12/10. O2 was weaned while inpatient. Bronchoscopies were performed prn - last on 12/9.  Dysphagia: Speech and nutrition were consulted. FEEs on 12/20 cleared for soft and bite sized. DHT was placed for enteral nutrition and removed on 12/23 .Diet at time of  discharge reg/thins."   PAIN: Are you having pain? Yes: NPRS scale: 6/10 Pain location: back, rib cage  Aggravating factors: Coughing, laughing Relieving factors: only when coughing  FALLS: Has patient fallen in last 6 months?  No  LIVING ENVIRONMENT: Lives with: lives with their family Lives in: House/apartment  PLOF:  Level of assistance: Independent with ADLs, Independent with IADLs Employment: On disability since 2022  PATIENT GOALS: Improve swallow and voice  OBJECTIVE:  Note: Objective measures were completed at Evaluation unless otherwise noted.  DIAGNOSTIC RESULTS:  Procedure: The nasal cavity was patent without rhinorrhea or polyp. The nasopharynx was also patent without mass or lesion. The base of tongue was visualized and was normal. There were no signs of pooling of secretions in the piriform sinuses. The true vocal folds were mobile bilaterally. There were no signs of glottic or supraglottic mucosal lesion or mass, but there was a round tan smooth lesion at the right vocal process. There was moderate interarytenoid pachydermia and post cricoid edema.   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 Symptoms include persistent  throat clearing, voice changes, and sensation of something in the throat.. Management includes aggressive reflux control and potential surgical removal if medical management fails to resolve granuloma.  We discussed that many patients improve with reflux therapy alone, avoiding surgery. Surgery involves intubation, laser removal, and steroid injection, typically outpatient with minimal recovery. - Prescribe omeprazole 40 mg 30 minutes before breakfast - Prescribe famotidine 40 mg at night - Recommend dietary changes to avoid reflux triggers - Advise against late  meals and lying down immediately after eating - Recommend seaweed-based supplement reflux Gourmet - Prescribe Zyrtec 10 mg daily for postnasal drainage - Prescribe Flonase nasal spray 2 puffs bilateral nares twice daily - Recommend ocean spray for nasal moisture - Schedule follow-up in three months to reassess with repeat scope exam                                                                                     TREATMENT DATE:  08/15/23: Pt completing HEP but unsure if he is performing correctly without direct verbal feedback from SLP. Conducted verbal and written education and instruction of targeted objectives in patient instructions; pt verbalized understanding. Continued ongoing education of LPR s/sx and vocal impact, with reference of RSI to aid understanding of silent reflux. Provided ENT recommendations for reflux management (pt only taking omeprazole at this time) as well as behavioral modifications to reduce acid reflux. Pt verbalized understanding; questions answered to pt satisfaction. Endorsed intermittent coughing (1-5x/day, lasting 10-15 seconds). Provided education of potential VCD and education of strategies for cough suppression. Pt able to effectively demo x2 in session without cues when tickle sensation present.   08/08/23: Provided extensive education re: recent ENT observations and recommendations in light of right VF lesion dx (see above). Pt reported benefit of SLP education and explanation to aid understanding of dx and tx options. Inconsistent HEP (2-3 days/week) reported since last session. Re-educated SOVTE and clear phonation to optimize voice. Able to complete exercises with occasional mod fading to min A. Able to achieve optimal voicing at sentence level given occasional fading to rare min A.   07/11/23: Pt reported drinking more water to thin phlegm and reduced urge to clear throat overall. Intermittent throat clearing still evidenced today, with SLP re-educating and  fading cues for throat clear alternatives. ST continued education and instruction SOVTE. Pt completed exercises effectively with occasional min-A, verbal cues and clinician model. ST then introduced Resonant Voice Therapy targeting gentle easy phonation and reduced vocal tension. ST guided pt through exercises, in which pt able to demo with occasional min A to optimize forward resonance. Utilized negative practice with model to aid pt awareness of correct production. Increasing awareness of forward vs back focused phonation exhibited throughout exercise trials.   07-10-23: ST reviewed MBSS results with patient and provided education on his [facility] swallow function. Utilized MBSS as visual support. No further swallowing intervention warranted per MBSS results. Provided pt education on potential causes of globus sensation including reflux given pt's history with GERD and potential causes and symptoms of vocal cord dysfunction. Pt informed ST he frequently coughs, clears his throat. ST educated on vocal hygiene practices to prevent irritation caused by  these behaviors. ST then initiated semi-occluded vocal tract exercises (SOVTE) targeting reduced muscle tension and strong breath support. Pt participated effectively and completed exercises with mod-A and a clinician model. ST plans to continue implementing SOVTE in future sessions and review strategies for cough suppression/throat clearing.  06-20-23: Provided recommendations for MBSS and ENT referral d/t complaints on ongoing dysphagia, globus sensation, persistent phlegm, and usual throat clearing. Pt and family verbalized understanding and agreement.    PATIENT EDUCATION: Education details: see above  Person educated: Patient and Spouse Education method: Explanation Education comprehension: verbalized understanding and needs further education   ASSESSMENT:  CLINICAL IMPRESSION: Patient is a 58 y.o. M who was seen today for dysphagia and concerns regarding  persistent globus sensation following medical complications of cardiac surgeries. Intubated x2 with prolonged NPO status. Discharged from hospital on regular textures/thin liquids. MBSS on 07/06/23 revealed functional oropharyngeal swallow and unremarkable sweep of esophagus. Presents with mild hoarseness and roughness. Continued ongoing education and instruction of voice exercises and vocal hygiene practices to optimize voice in light of recently dx right VF lesion. Pt would benefit from skilled ST intervention to optimize vocal hygiene and improve voicing to aid return to baseline.   OBJECTIVE IMPAIRMENTS: include voice disorder and dysphagia. These impairments are limiting patient from effectively communicating at home and in community and safety when swallowing. Factors affecting potential to achieve goals and functional outcome are co-morbidities and previous level of function. Patient will benefit from skilled SLP services to address above impairments and improve overall function.  REHAB POTENTIAL: Good   GOALS: Goals reviewed with patient? Yes  SHORT TERM GOALS: Target date: 07/18/2023  Pt will complete MBSS to evaluate current swallow function Baseline: Goal status: MET  2.  Pt will perform recommended swallow exercises with rare min A x 2 sessions Baseline:  Goal status: DEFERRED - WNL swallow  3.  Pt will utilize recommended swallow precautions with rare min A x2 sessions  Baseline:  Goal status: DEFERRED - WNL swallow  4.  Pt will reduce throat clearing by 50% by STG date Baseline: > 7 Goal status: NOT MET  5.  Pt will complete recommended voice exercises with occasional min A x2 sessions (pending ENT evaluation) Baseline:  Goal status: PARTIALLY MET   LONG TERM GOALS: Target date: 09/12/2023  Pt will carryover recommended swallow exercises/precautions > 1 week with mod I   Baseline:  Goal status: DEFERRED - WNL swallow  2.  Pt will demonstrate clear voicing on  structured tasks given rare min A x2 sessions  Baseline:  Goal status: IN PROGRESS  3.  Pt will carryover clear voicing in unstructured conversations given rare min A x2 sessions  Baseline:  Goal status: IN PROGRESS  4.  Pt will report improved voice and swallow functioning via PROMS (see above) by 2 pts on each  Baseline:  Goal status: IN PROGRESS   PLAN:  SLP FREQUENCY: 2x/week  SLP DURATION: 12 weeks  PLANNED INTERVENTIONS: Aspiration precaution training, Pharyngeal strengthening exercises, Diet toleration management , Trials of upgraded texture/liquids, Internal/external aids, Oral motor exercises, Functional tasks, Multimodal communication approach, SLP instruction and feedback, Compensatory strategies, Patient/family education, [phone] Treatment of speech (30 or 45 min) , and [id number] Treatment of swallowing function    Gracy Racer, CCC-SLP 08/15/2023, 8:41 AM

## 2023-08-15 ENCOUNTER — Ambulatory Visit: Payer: Self-pay

## 2023-08-15 DIAGNOSIS — R498 Other voice and resonance disorders: Secondary | ICD-10-CM

## 2023-08-15 NOTE — Patient Instructions (Addendum)
 Look up silent reflux to help you understand your symptoms.   Things to consider for voice exercises: Are you relaxed? Or are you pushing? We want relaxed! Is the airflow consistent or choppy? We want consistent? Where do you feel the buzz or tingle? We want around your mouth? Does it sound clear? Or does it sound rough/gravely?  ACID REFLUX can be a possible cause of a voice disorder or throat irritation. Acid reflux is a disorder where acid from your stomach is abnormally spilled over onto your voice box after eating, during sleep, or even during singing. Acid reflux causes irritation and inflammation to your vocal folds and should be avoided and treated by changing eating habits, changing lifestyle, and taking medication (if prescribed by your doctor).   CHANGE EATING HABITS   Avoid "trigger" foods. Certain foods and drinks can trigger acid reflux.  1. Caffeine- in coffee, tea, chocolate, sodas  2. Carbonated beverages  3. Mint and menthol  4. Fatty/fried foods  5. Citrus fruits  6. Tomato products  7. Spicy foods  8. Alcohol    CHANGING LIFESTYLE HABITS   Drink 8 glasses of water per day (64oz)    Stop smoking   Avoid clearing your throat   Allow 3 hours between last big meal and going to bed at night   Keep yourself upright for one hour after you eat   Elevate the head of your bed using 6-inch blocks under the head of the bed or a bed wedge between the box spring and the mattress.   Eat small meals throughout the day rather than 3 big meals   Eat slowly   Wear loose clothing   TAKING MEDICATION   If prescribed one time a day, take 15-30 minutes before breakfast   If prescribed two times a day, take 15-30 minutes before breakfast and 15- 30 minutes before dinner   CHANGING THE WAY YOU USE YOUR VOICE  "Best voice/ Least effort"

## 2023-08-16 ENCOUNTER — Inpatient Hospital Stay (HOSPITAL_COMMUNITY): Admission: RE | Admit: 2023-08-16 | Source: Ambulatory Visit

## 2023-08-17 ENCOUNTER — Telehealth (HOSPITAL_COMMUNITY): Payer: Self-pay | Admitting: *Deleted

## 2023-08-17 ENCOUNTER — Ambulatory Visit: Payer: Medicare Other | Admitting: Speech Pathology

## 2023-08-17 NOTE — Telephone Encounter (Signed)
 Left message to call cardiac rehab. Called to inquire about Wednesday absence. Thayer Headings RN BSN

## 2023-08-18 ENCOUNTER — Encounter (HOSPITAL_COMMUNITY)
Admission: RE | Admit: 2023-08-18 | Discharge: 2023-08-18 | Disposition: A | Discharge status: Home / Self Care | Payer: Medicare Other | Source: Ambulatory Visit | Attending: Internal Medicine | Admitting: Internal Medicine

## 2023-08-18 DIAGNOSIS — Z952 Presence of prosthetic heart valve: Secondary | ICD-10-CM

## 2023-08-18 DIAGNOSIS — Z951 Presence of aortocoronary bypass graft: Secondary | ICD-10-CM | POA: Diagnosis not present

## 2023-08-21 ENCOUNTER — Encounter (HOSPITAL_COMMUNITY)
Admission: RE | Admit: 2023-08-21 | Discharge: 2023-08-21 | Disposition: A | Discharge status: Home / Self Care | Source: Ambulatory Visit | Attending: Internal Medicine

## 2023-08-21 DIAGNOSIS — Z952 Presence of prosthetic heart valve: Secondary | ICD-10-CM

## 2023-08-21 DIAGNOSIS — Z951 Presence of aortocoronary bypass graft: Secondary | ICD-10-CM | POA: Diagnosis not present

## 2023-08-21 NOTE — Therapy (Unsigned)
 OUTPATIENT SPEECH LANGUAGE PATHOLOGY TREATMENT   Patient Name: Johnathan Anderson MRN: [id number] DOB:29-Nov-1965, 58 y.o., male Today's Date: 08/22/2023  PCP: Irven Coe MD REFERRING PROVIDER: Irven Coe, MD  END OF SESSION:  End of Session - 08/22/23 0847     Visit Number 6    Number of Visits 25    Date for SLP Re-Evaluation 09/12/23    Authorization Type [employer]    SLP Start Time 0847    SLP Stop Time  0928    SLP Time Calculation (min) 41 min    Activity Tolerance Patient tolerated treatment well               Past Medical History:  Diagnosis Date   Aortic aneurysm (HCC)    Arthritis    Left Knee   Cancer (HCC) 2022   Skin Cancer   GERD (gastroesophageal reflux disease)    Headache    Migraines   Heart murmur    Hyperlipidemia    Hypertension    Peripheral vascular disease (HCC)    Thoracic Aortic Aneurysm   Vertigo    Past Surgical History:  Procedure Laterality Date   AORTIC VALVE REPLACEMENT N/A 04/26/2023   Procedure: AORTIC VALVE REPLACEMENT (AVR) USING 23 MM KONECT RESILIA AORTIC VALVED CONDUIT;  Surgeon: Eugenio Hoes, MD;  Location: [facility] OR;  Service: Open Heart Surgery;  Laterality: N/A;   APPENDECTOMY     1990s   CANNULATION FOR ECMO (EXTRACORPOREAL MEMBRANE OXYGENATION) N/A 04/26/2023   Procedure: CENTRAL CANNULATION FOR ECMO (EXTRACORPOREAL MEMBRANE OXYGENATION);  Surgeon: Eugenio Hoes, MD;  Location: [facility] OR;  Service: Open Heart Surgery;  Laterality: N/A;   COLONOSCOPY WITH ESOPHAGOGASTRODUODENOSCOPY (EGD)     2020s   CORONARY ARTERY BYPASS GRAFT N/A 04/26/2023   Procedure: CORONARY ARTERY BYPASS GRAFTING (CABG) x 2 USING RIGHT AND LEFT GREATER SAPHENOUS VEIN HARVESTED OPEN;  Surgeon: Eugenio Hoes, MD;  Location: [facility] OR;  Service: Open Heart Surgery;  Laterality: N/A;   EXPLORATION POST OPERATIVE OPEN HEART N/A 04/26/2023   Procedure: EXPLORATION POST OPERATIVE OPEN HEART;  Surgeon: Eugenio Hoes, MD;  Location: [facility] OR;  Service: Open Heart  Surgery;  Laterality: N/A;   EXPLORATION POST OPERATIVE OPEN HEART N/A 04/27/2023   Procedure: EXPLORATION POST OPERATIVE OPEN HEART;  Surgeon: Eugenio Hoes, MD;  Location: [facility] OR;  Service: Open Heart Surgery;  Laterality: N/A;   EYE SURGERY Right    eye tissue removed   MOUTH SURGERY  2022   Skin Cancer Removal   REPLACEMENT ASCENDING AORTA N/A 04/26/2023   Procedure: REPLACEMENT ASCENDING AORTA USING 23 MM KONECT RESILIA AORTIC VALVED CONDUIT;  Surgeon: Eugenio Hoes, MD;  Location: [facility] OR;  Service: Open Heart Surgery;  Laterality: N/A;   RIGHT/LEFT HEART CATH AND CORONARY ANGIOGRAPHY N/A 12/28/2022   Procedure: RIGHT/LEFT HEART CATH AND CORONARY ANGIOGRAPHY;  Surgeon: Swazil[place], Peter M, MD;  Location: [facility] INVASIVE CV LAB;  Service: Cardiovascular;  Laterality: N/A;   STERIOD INJECTION  09/16/2011   Procedure: MINOR STEROID INJECTION;  Surgeon: Mat Carne, MD;  Location: [facility];  Service: Orthopedics;  Laterality: Left;  Left Lumbar Five-Sacral One Transforaminal Epidural Steroid Injection   STERNAL WOUND DEBRIDEMENT N/A 05/01/2023   Procedure: MEDIAL STERNAL WOUND DEBRIDEMENT;  Surgeon: Lovett Sox, MD;  Location: [facility] OR;  Service: Thoracic;  Laterality: N/A;   TEE WITHOUT CARDIOVERSION N/A 11/05/2020   Procedure: TRANSESOPHAGEAL ECHOCARDIOGRAM (TEE);  Surgeon: Christell Constant, MD;  Location: [facility] ENDOSCOPY;  Service: Cardiovascular;  Laterality: N/A;  TEE WITHOUT CARDIOVERSION N/A 04/26/2023   Procedure: TRANSESOPHAGEAL ECHOCARDIOGRAM;  Surgeon: Eugenio Hoes, MD;  Location: [facility] OR;  Service: Open Heart Surgery;  Laterality: N/A;   TEE WITHOUT CARDIOVERSION N/A 04/27/2023   Procedure: TRANSESOPHAGEAL ECHOCARDIOGRAM (TEE);  Surgeon: Eugenio Hoes, MD;  Location: [facility] OR;  Service: Open Heart Surgery;  Laterality: N/A;   TEE WITHOUT CARDIOVERSION N/A 05/01/2023   Procedure: TRANSESOPHAGEAL ECHOCARDIOGRAM (TEE);  Surgeon: Lovett Sox, MD;  Location: [facility] OR;   Service: Thoracic;  Laterality: N/A;   Patient Active Problem List   Diagnosis Date Noted   Aortic valve replaced 04/26/2023   S/P AVR (aortic valve replacement) 04/26/2023   Chronic migraine without aura without status migrainosus, not intractable 09/14/2022   Essential hypertension 07/07/2022   Squamous cell carcinoma, 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   Migraine with aura and without status migrainosus, not intractable 02/19/2021   Thoracic aortic aneurysm without rupture (HCC) 10/05/2020   Severe aortic stenosis 10/05/2020   Bicuspid aortic valve 10/05/2020   Family history of aortic dissection 10/05/2020   Periodontitis 10/05/2020   Chronic back pain 02/24/2014    ONSET DATE: 06/19/23 (referral date)   REFERRING DIAG: R13.10 (ICD-10-CM) - Dysphagia, unspecified  THERAPY DIAG: Other voice and resonance disorders  Rationale for Evaluation and Treatment: Rehabilitation  SUBJECTIVE:   SUBJECTIVE STATEMENT: "I've been improving so much, with this therapy and pulmonary rehab"  Pt accompanied by: self  PERTINENT HISTORY: "Johnathan Anderson is a 58 y.o. male with bicuspid AoV with stenosis and ascending aortic aneurysm who is s/p [employer], CABG x 2 (SVG-RCA, SVG-LAD) on 11/20 at OSH c/b cardiac arrest and bleeding requiring return to OR for exploration, MTP and cannulation of central VA ECMO cannulation 11/21. He was transferred to [facility] on 11/26 for evaluation of advanced heart failure therapies and went to OR on 11/27 for VA ECMO decannulation and IABP placement.   The hospital course was significant for the following:. Hypoxic respiratory failure: Required mechanical ventilation, extubated 12/10. O2 was weaned while inpatient. Bronchoscopies were performed prn - last on 12/9.  Dysphagia: Speech and nutrition were consulted. FEEs on 12/20 cleared for soft and bite sized. DHT was placed for enteral nutrition and removed on 12/23 .Diet at time of discharge reg/thins."   PAIN: Are you having pain? Yes:  NPRS scale: 6/10 Pain location: back, rib cage  Aggravating factors: Coughing, laughing Relieving factors: only when coughing  FALLS: Has patient fallen in last 6 months?  No  LIVING ENVIRONMENT: Lives with: lives with their family Lives in: House/apartment  PLOF:  Level of assistance: Independent with ADLs, Independent with IADLs Employment: On disability since 2022  PATIENT GOALS: Improve swallow and voice  OBJECTIVE:  Note: Objective measures were completed at Evaluation unless otherwise noted.  DIAGNOSTIC RESULTS:  Procedure: The nasal cavity was patent without rhinorrhea or polyp. The nasopharynx was also patent without mass or lesion. The base of tongue was visualized and was normal. There were no signs of pooling of secretions in the piriform sinuses. The true vocal folds were mobile bilaterally. There were no signs of glottic or supraglottic mucosal lesion or mass, but there was a round tan smooth lesion at the right vocal process. There was moderate interarytenoid pachydermia and post cricoid edema.   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 Symptoms include persistent throat clearing, voice changes, and sensation of something in  the throat.. Management includes aggressive reflux control and potential surgical removal if medical management fails to resolve granuloma.  We discussed that many patients improve with reflux therapy alone, avoiding surgery. Surgery involves intubation, laser removal, and steroid injection, typically outpatient with minimal recovery. - Prescribe omeprazole 40 mg 30 minutes before breakfast - Prescribe famotidine 40 mg at night - Recommend dietary changes to avoid reflux triggers - Advise against late meals and lying down immediately after eating - Recommend  seaweed-based supplement reflux Gourmet - Prescribe Zyrtec 10 mg daily for postnasal drainage - Prescribe Flonase nasal spray 2 puffs bilateral nares twice daily - Recommend ocean spray for nasal moisture - Schedule follow-up in three months to reassess with repeat scope exam                                                      TREATMENT DATE:  08/22/23: Endorsed completion of HEP. Exhibits improvements in vocal clarity and intensity. Obtained majority of recommended GERD medications. Reported increased vocal fatigue and subsequent coughing after 45 minute phone conversation. Targeted extended conversation today, in which volume and clarity mildly declined ~10 minutes into conversation. Provided education and instruction of recommended techniques (see pt instructions) to optimize clarity and intensity to maintain conversation. Intermittent min A provided to utilize clear voice and forward projection for remaining ~30 minutes.   08/15/23: Pt completing HEP but unsure if he is performing correctly without direct verbal feedback from SLP. Conducted verbal and written education and instruction of targeted objectives in patient instructions; pt verbalized understanding. Continued ongoing education of LPR s/sx and vocal impact, with reference of RSI to aid understanding of silent reflux. Provided ENT recommendations for reflux management (pt only taking omeprazole at this time) as well as behavioral modifications to reduce acid reflux. Pt verbalized understanding; questions answered to pt satisfaction. Endorsed intermittent coughing (1-5x/day, lasting 10-15 seconds). Provided education of potential VCD and education of strategies for cough suppression. Pt able to effectively demo x2 in session without cues when tickle sensation present.   08/08/23: Provided extensive education re: recent ENT observations and recommendations in light of right VF lesion dx (see above). Pt reported benefit of SLP education and  explanation to aid understanding of dx and tx options. Inconsistent HEP (2-3 days/week) reported since last session. Re-educated SOVTE and clear phonation to optimize voice. Able to complete exercises with occasional mod fading to min A. Able to achieve optimal voicing at sentence level given occasional fading to rare min A.   07/11/23: Pt reported drinking more water to thin phlegm and reduced urge to clear throat overall. Intermittent throat clearing still evidenced today, with SLP re-educating and fading cues for throat clear alternatives. ST continued education and instruction SOVTE. Pt completed exercises effectively with occasional min-A, verbal cues and clinician model. ST then introduced Resonant Voice Therapy targeting gentle easy phonation and reduced vocal tension. ST guided pt through exercises, in which pt able to demo with occasional min A to optimize forward resonance. Utilized negative practice with model to aid pt awareness of correct production. Increasing awareness of forward vs back focused phonation exhibited throughout exercise trials.   07-10-23: ST reviewed MBSS results with patient and provided education on his [facility] swallow function. Utilized MBSS as visual support. No further swallowing intervention warranted per MBSS results. Provided pt education on potential  causes of globus sensation including reflux given pt's history with GERD and potential causes and symptoms of vocal cord dysfunction. Pt informed ST he frequently coughs, clears his throat. ST educated on vocal hygiene practices to prevent irritation caused by these behaviors. ST then initiated semi-occluded vocal tract exercises (SOVTE) targeting reduced muscle tension and strong breath support. Pt participated effectively and completed exercises with mod-A and a clinician model. ST plans to continue implementing SOVTE in future sessions and review strategies for cough suppression/throat clearing.  06-20-23: Provided recommendations  for MBSS and ENT referral d/t complaints on ongoing dysphagia, globus sensation, persistent phlegm, and usual throat clearing. Pt and family verbalized understanding and agreement.    PATIENT EDUCATION: Education details: see above  Person educated: Patient and Spouse Education method: Explanation Education comprehension: verbalized understanding and needs further education   ASSESSMENT:  CLINICAL IMPRESSION: Patient is a 58 y.o. M who was seen today for dysphagia and concerns regarding persistent globus sensation following medical complications of cardiac surgeries. Intubated x2 with prolonged NPO status. Discharged from hospital on regular textures/thin liquids. MBSS on 07/06/23 revealed functional oropharyngeal swallow and unremarkable sweep of esophagus. Continued ongoing education and instruction of voice exercises and vocal hygiene practices to optimize voice in light of recently dx right VF lesion. Pt would benefit from skilled ST intervention to optimize vocal hygiene and improve voicing to aid return to baseline.   OBJECTIVE IMPAIRMENTS: include voice disorder and dysphagia. These impairments are limiting patient from effectively communicating at home and in community and safety when swallowing. Factors affecting potential to achieve goals and functional outcome are co-morbidities and previous level of function. Patient will benefit from skilled SLP services to address above impairments and improve overall function.  REHAB POTENTIAL: Good   GOALS: Goals reviewed with patient? Yes  SHORT TERM GOALS: Target date: 07/18/2023  Pt will complete MBSS to evaluate current swallow function Baseline: Goal status: MET  2.  Pt will perform recommended swallow exercises with rare min A x 2 sessions Baseline:  Goal status: DEFERRED - WNL swallow  3.  Pt will utilize recommended swallow precautions with rare min A x2 sessions  Baseline:  Goal status: DEFERRED - WNL swallow  4.  Pt will  reduce throat clearing by 50% by STG date Baseline: > 7 Goal status: NOT MET  5.  Pt will complete recommended voice exercises with occasional min A x2 sessions (pending ENT evaluation) Baseline:  Goal status: PARTIALLY MET   LONG TERM GOALS: Target date: 09/12/2023  Pt will carryover recommended swallow exercises/precautions > 1 week with mod I   Baseline:  Goal status: DEFERRED - WNL swallow  2.  Pt will demonstrate clear voicing on structured tasks given rare min A x2 sessions  Baseline:  Goal status: IN PROGRESS  3.  Pt will carryover clear voicing in unstructured conversations given rare min A x2 sessions  Baseline: 08/22/23 Goal status: IN PROGRESS  4.  Pt will report improved voice and swallow functioning via PROMS (see above) by 2 pts on each  Baseline:  Goal status: IN PROGRESS   PLAN:  SLP FREQUENCY: 2x/week  SLP DURATION: 12 weeks  PLANNED INTERVENTIONS: Aspiration precaution training, Pharyngeal strengthening exercises, Diet toleration management , Trials of upgraded texture/liquids, Internal/external aids, Oral motor exercises, Functional tasks, Multimodal communication approach, SLP instruction and feedback, Compensatory strategies, Patient/family education, [phone] Treatment of speech (30 or 45 min) , and [id number] Treatment of swallowing function    Gracy Racer, CCC-SLP 08/22/2023, 9:30 AM

## 2023-08-22 ENCOUNTER — Ambulatory Visit: Payer: Self-pay

## 2023-08-22 DIAGNOSIS — R498 Other voice and resonance disorders: Secondary | ICD-10-CM

## 2023-08-22 NOTE — Patient Instructions (Addendum)
 Recommendations for voice fatigue:  Be mindful of your clear voice versus pressed/raspy voice Your first giveaway is decreased volume Aim for a forward, clear voice  Your second giveaway is a raspy voice  Aim for a forward, clear voice   When your voice changes:  Take a sip of water Take a deep breathe and send your voice forward  Get your listener talking so you can have a break  Stop the conversation if you can't get your voice forward and clear

## 2023-08-23 ENCOUNTER — Encounter (HOSPITAL_COMMUNITY)
Admission: RE | Admit: 2023-08-23 | Discharge: 2023-08-23 | Disposition: A | Discharge status: Home / Self Care | Source: Ambulatory Visit | Attending: Internal Medicine | Admitting: Internal Medicine

## 2023-08-23 ENCOUNTER — Encounter: Payer: Commercial Managed Care - HMO | Admitting: Speech Pathology

## 2023-08-23 DIAGNOSIS — Z952 Presence of prosthetic heart valve: Secondary | ICD-10-CM

## 2023-08-23 DIAGNOSIS — Z951 Presence of aortocoronary bypass graft: Secondary | ICD-10-CM

## 2023-08-24 ENCOUNTER — Ambulatory Visit: Payer: Medicare Other | Admitting: Plastic Surgery

## 2023-08-24 ENCOUNTER — Ambulatory Visit: Payer: Medicare Other | Admitting: Speech Pathology

## 2023-08-25 ENCOUNTER — Encounter (HOSPITAL_COMMUNITY): Payer: Medicare Other

## 2023-08-28 ENCOUNTER — Encounter (HOSPITAL_COMMUNITY)
Admission: RE | Admit: 2023-08-28 | Discharge: 2023-08-28 | Disposition: A | Discharge status: Home / Self Care | Payer: Medicare Other | Source: Ambulatory Visit | Attending: Internal Medicine | Admitting: Internal Medicine

## 2023-08-28 DIAGNOSIS — Z951 Presence of aortocoronary bypass graft: Secondary | ICD-10-CM

## 2023-08-28 DIAGNOSIS — Z952 Presence of prosthetic heart valve: Secondary | ICD-10-CM

## 2023-08-30 ENCOUNTER — Encounter (HOSPITAL_COMMUNITY)
Admission: RE | Admit: 2023-08-30 | Discharge: 2023-08-30 | Disposition: A | Discharge status: Home / Self Care | Payer: Medicare Other | Source: Ambulatory Visit | Attending: Internal Medicine

## 2023-08-30 DIAGNOSIS — Z952 Presence of prosthetic heart valve: Secondary | ICD-10-CM

## 2023-08-30 DIAGNOSIS — Z951 Presence of aortocoronary bypass graft: Secondary | ICD-10-CM | POA: Diagnosis not present

## 2023-08-31 ENCOUNTER — Ambulatory Visit: Payer: Medicare Other | Admitting: Speech Pathology

## 2023-08-31 DIAGNOSIS — R498 Other voice and resonance disorders: Secondary | ICD-10-CM

## 2023-08-31 NOTE — Therapy (Signed)
 OUTPATIENT SPEECH LANGUAGE PATHOLOGY TREATMENT   Patient Name: Johnathan Anderson MRN: [id number] DOB:Jan 20, 1966, 58 y.o., male Today's Date: 08/31/2023  PCP: Irven Coe MD REFERRING PROVIDER: Irven Coe, MD  END OF SESSION:  End of Session - 08/31/23 0758     Visit Number 7    Number of Visits 25    Date for SLP Re-Evaluation 09/12/23    Authorization Type [employer]    SLP Start Time 0800    SLP Stop Time  0845    SLP Time Calculation (min) 45 min    Activity Tolerance Patient tolerated treatment well             Past Medical History:  Diagnosis Date   Aortic aneurysm (HCC)    Arthritis    Left Knee   Cancer (HCC) 2022   Skin Cancer   GERD (gastroesophageal reflux disease)    Headache    Migraines   Heart murmur    Hyperlipidemia    Hypertension    Peripheral vascular disease (HCC)    Thoracic Aortic Aneurysm   Vertigo    Past Surgical History:  Procedure Laterality Date   AORTIC VALVE REPLACEMENT N/A 04/26/2023   Procedure: AORTIC VALVE REPLACEMENT (AVR) USING 23 MM KONECT RESILIA AORTIC VALVED CONDUIT;  Surgeon: Eugenio Hoes, MD;  Location: [facility] OR;  Service: Open Heart Surgery;  Laterality: N/A;   APPENDECTOMY     1990s   CANNULATION FOR ECMO (EXTRACORPOREAL MEMBRANE OXYGENATION) N/A 04/26/2023   Procedure: CENTRAL CANNULATION FOR ECMO (EXTRACORPOREAL MEMBRANE OXYGENATION);  Surgeon: Eugenio Hoes, MD;  Location: [facility] OR;  Service: Open Heart Surgery;  Laterality: N/A;   COLONOSCOPY WITH ESOPHAGOGASTRODUODENOSCOPY (EGD)     2020s   CORONARY ARTERY BYPASS GRAFT N/A 04/26/2023   Procedure: CORONARY ARTERY BYPASS GRAFTING (CABG) x 2 USING RIGHT AND LEFT GREATER SAPHENOUS VEIN HARVESTED OPEN;  Surgeon: Eugenio Hoes, MD;  Location: [facility] OR;  Service: Open Heart Surgery;  Laterality: N/A;   EXPLORATION POST OPERATIVE OPEN HEART N/A 04/26/2023   Procedure: EXPLORATION POST OPERATIVE OPEN HEART;  Surgeon: Eugenio Hoes, MD;  Location: [facility] OR;  Service: Open Heart Surgery;   Laterality: N/A;   EXPLORATION POST OPERATIVE OPEN HEART N/A 04/27/2023   Procedure: EXPLORATION POST OPERATIVE OPEN HEART;  Surgeon: Eugenio Hoes, MD;  Location: [facility] OR;  Service: Open Heart Surgery;  Laterality: N/A;   EYE SURGERY Right    eye tissue removed   MOUTH SURGERY  2022   Skin Cancer Removal   REPLACEMENT ASCENDING AORTA N/A 04/26/2023   Procedure: REPLACEMENT ASCENDING AORTA USING 23 MM KONECT RESILIA AORTIC VALVED CONDUIT;  Surgeon: Eugenio Hoes, MD;  Location: [facility] OR;  Service: Open Heart Surgery;  Laterality: N/A;   RIGHT/LEFT HEART CATH AND CORONARY ANGIOGRAPHY N/A 12/28/2022   Procedure: RIGHT/LEFT HEART CATH AND CORONARY ANGIOGRAPHY;  Surgeon: Swazil[place], Peter M, MD;  Location: [facility] INVASIVE CV LAB;  Service: Cardiovascular;  Laterality: N/A;   STERIOD INJECTION  09/16/2011   Procedure: MINOR STEROID INJECTION;  Surgeon: Mat Carne, MD;  Location: [facility];  Service: Orthopedics;  Laterality: Left;  Left Lumbar Five-Sacral One Transforaminal Epidural Steroid Injection   STERNAL WOUND DEBRIDEMENT N/A 05/01/2023   Procedure: MEDIAL STERNAL WOUND DEBRIDEMENT;  Surgeon: Lovett Sox, MD;  Location: [facility] OR;  Service: Thoracic;  Laterality: N/A;   TEE WITHOUT CARDIOVERSION N/A 11/05/2020   Procedure: TRANSESOPHAGEAL ECHOCARDIOGRAM (TEE);  Surgeon: Christell Constant, MD;  Location: [facility] ENDOSCOPY;  Service: Cardiovascular;  Laterality: N/A;   TEE  WITHOUT CARDIOVERSION N/A 04/26/2023   Procedure: TRANSESOPHAGEAL ECHOCARDIOGRAM;  Surgeon: Eugenio Hoes, MD;  Location: [facility] OR;  Service: Open Heart Surgery;  Laterality: N/A;   TEE WITHOUT CARDIOVERSION N/A 04/27/2023   Procedure: TRANSESOPHAGEAL ECHOCARDIOGRAM (TEE);  Surgeon: Eugenio Hoes, MD;  Location: [facility] OR;  Service: Open Heart Surgery;  Laterality: N/A;   TEE WITHOUT CARDIOVERSION N/A 05/01/2023   Procedure: TRANSESOPHAGEAL ECHOCARDIOGRAM (TEE);  Surgeon: Lovett Sox, MD;  Location: [facility] OR;  Service:  Thoracic;  Laterality: N/A;   Patient Active Problem List   Diagnosis Date Noted   Aortic valve replaced 04/26/2023   S/P AVR (aortic valve replacement) 04/26/2023   Chronic migraine without aura without status migrainosus, not intractable 09/14/2022   Essential hypertension 07/07/2022   Squamous cell carcinoma, 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   Migraine with aura and without status migrainosus, not intractable 02/19/2021   Thoracic aortic aneurysm without rupture (HCC) 10/05/2020   Severe aortic stenosis 10/05/2020   Bicuspid aortic valve 10/05/2020   Family history of aortic dissection 10/05/2020   Periodontitis 10/05/2020   Chronic back pain 02/24/2014    ONSET DATE: 06/19/23 (referral date)   REFERRING DIAG: R13.10 (ICD-10-CM) - Dysphagia, unspecified  THERAPY DIAG: Other voice and resonance disorders  Rationale for Evaluation and Treatment: Rehabilitation  SUBJECTIVE:   SUBJECTIVE STATEMENT: "I feel like I am doing better" Pt accompanied by: self  PERTINENT HISTORY: "Johnathan Anderson is a 58 y.o. male with bicuspid AoV with stenosis and ascending aortic aneurysm who is s/p [employer], CABG x 2 (SVG-RCA, SVG-LAD) on 11/20 at OSH c/b cardiac arrest and bleeding requiring return to OR for exploration, MTP and cannulation of central VA ECMO cannulation 11/21. He was transferred to [facility] on 11/26 for evaluation of advanced heart failure therapies and went to OR on 11/27 for VA ECMO decannulation and IABP placement.   The hospital course was significant for the following:. Hypoxic respiratory failure: Required mechanical ventilation, extubated 12/10. O2 was weaned while inpatient. Bronchoscopies were performed prn - last on 12/9.  Dysphagia: Speech and nutrition were consulted. FEEs on 12/20 cleared for soft and bite sized. DHT was placed for enteral nutrition and removed on 12/23 .Diet at time of discharge reg/thins."   PAIN: Are you having pain? Yes: NPRS scale: 6/10 Pain location: back, rib cage   Aggravating factors: Coughing, laughing Relieving factors: only when coughing  FALLS: Has patient fallen in last 6 months?  No  LIVING ENVIRONMENT: Lives with: lives with their family Lives in: House/apartment  PLOF:  Level of assistance: Independent with ADLs, Independent with IADLs Employment: On disability since 2022  PATIENT GOALS: Improve swallow and voice  OBJECTIVE:  Note: Objective measures were completed at Evaluation unless otherwise noted.  DIAGNOSTIC RESULTS:  Procedure: The nasal cavity was patent without rhinorrhea or polyp. The nasopharynx was also patent without mass or lesion. The base of tongue was visualized and was normal. There were no signs of pooling of secretions in the piriform sinuses. The true vocal folds were mobile bilaterally. There were no signs of glottic or supraglottic mucosal lesion or mass, but there was a round tan smooth lesion at the right vocal process. There was moderate interarytenoid pachydermia and post cricoid edema.   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 Symptoms include persistent throat clearing, voice changes, and sensation of something in the throat.. Management includes aggressive reflux  control and potential surgical removal if medical management fails to resolve granuloma.  We discussed that many patients improve with reflux therapy alone, avoiding surgery. Surgery involves intubation, laser removal, and steroid injection, typically outpatient with minimal recovery. - Prescribe omeprazole 40 mg 30 minutes before breakfast - Prescribe famotidine 40 mg at night - Recommend dietary changes to avoid reflux triggers - Advise against late meals and lying down immediately after eating - Recommend seaweed-based supplement reflux Gourmet -  Prescribe Zyrtec 10 mg daily for postnasal drainage - Prescribe Flonase nasal spray 2 puffs bilateral nares twice daily - Recommend ocean spray for nasal moisture - Schedule follow-up in three months to reassess with repeat scope exam                                                      TREATMENT DATE:  08/31/23: SLP initiated PROMs completed at initial evaluation. Pt scores improved in all areas aside from voice, however pt presents with improved vocal quality, and shares he has noticed positive change, but hoping to continue to improve with practice. Pt also reports positive feedback from wife regarding his voice. SLP initiated vocal warmup with SOVTE and led patient through phrases for carryover of forward focused voice. Pt completed exercises with model and min-A, and demonstrated clear voice given min-A when completing phrases. SLP led pt into practice in conversation with pt benefiting from rare min-A and demonstrating improved vocal quality throughout. Pt agreeable to discharge.  Question Patient's Response  My swallowing problem has caused me to lose weight 0  2.  My swallowing problem interferes with my ability to go out to meals 0  3.  Swallowing liquids takes extra effort 0  4.  Swallowing solids takes extra effort 0  5.  Swallowing pills takes extra effort 2  6.  Swallowing is painful 0  7.  The pleasure of eating is affected by my swallowing 0  8.  When I swallow food sticks in my throat 0  9.  I cough when I eat 2  10.  Swallowing is stressful  0  0= No problem 4= Severe problem   08/22/23: Endorsed completion of HEP. Exhibits improvements in vocal clarity and intensity. Obtained majority of recommended GERD medications. Reported increased vocal fatigue and subsequent coughing after 45 minute phone conversation. Targeted extended conversation today, in which volume and clarity mildly declined ~10 minutes into conversation. Provided education and instruction of recommended techniques  (see pt instructions) to optimize clarity and intensity to maintain conversation. Intermittent min A provided to utilize clear voice and forward projection for remaining ~30 minutes.   08/15/23: Pt completing HEP but unsure if he is performing correctly without direct verbal feedback from SLP. Conducted verbal and written education and instruction of targeted objectives in patient instructions; pt verbalized understanding. Continued ongoing education of LPR s/sx and vocal impact, with reference of RSI to aid understanding of silent reflux. Provided ENT recommendations for reflux management (pt only taking omeprazole at this time) as well as behavioral modifications to reduce acid reflux. Pt verbalized understanding; questions answered to pt satisfaction. Endorsed intermittent coughing (1-5x/day, lasting 10-15 seconds). Provided education of potential VCD and education of strategies for cough suppression. Pt able to effectively demo x2 in session without cues when tickle sensation present.   08/08/23: Provided extensive education re: recent  ENT observations and recommendations in light of right VF lesion dx (see above). Pt reported benefit of SLP education and explanation to aid understanding of dx and tx options. Inconsistent HEP (2-3 days/week) reported since last session. Re-educated SOVTE and clear phonation to optimize voice. Able to complete exercises with occasional mod fading to min A. Able to achieve optimal voicing at sentence level given occasional fading to rare min A.   07/11/23: Pt reported drinking more water to thin phlegm and reduced urge to clear throat overall. Intermittent throat clearing still evidenced today, with SLP re-educating and fading cues for throat clear alternatives. ST continued education and instruction SOVTE. Pt completed exercises effectively with occasional min-A, verbal cues and clinician model. ST then introduced Resonant Voice Therapy targeting gentle easy phonation and  reduced vocal tension. ST guided pt through exercises, in which pt able to demo with occasional min A to optimize forward resonance. Utilized negative practice with model to aid pt awareness of correct production. Increasing awareness of forward vs back focused phonation exhibited throughout exercise trials.   07-10-23: ST reviewed MBSS results with patient and provided education on his [facility] swallow function. Utilized MBSS as visual support. No further swallowing intervention warranted per MBSS results. Provided pt education on potential causes of globus sensation including reflux given pt's history with GERD and potential causes and symptoms of vocal cord dysfunction. Pt informed ST he frequently coughs, clears his throat. ST educated on vocal hygiene practices to prevent irritation caused by these behaviors. ST then initiated semi-occluded vocal tract exercises (SOVTE) targeting reduced muscle tension and strong breath support. Pt participated effectively and completed exercises with mod-A and a clinician model. ST plans to continue implementing SOVTE in future sessions and review strategies for cough suppression/throat clearing.  06-20-23: Provided recommendations for MBSS and ENT referral d/t complaints on ongoing dysphagia, globus sensation, persistent phlegm, and usual throat clearing. Pt and family verbalized understanding and agreement.    PATIENT EDUCATION: Education details: see above  Person educated: Patient and Spouse Education method: Explanation Education comprehension: verbalized understanding and needs further education   ASSESSMENT:  CLINICAL IMPRESSION: Continued ongoing education and instruction of voice exercises and vocal hygiene practices to optimize voice in light of recently dx right VF lesion. Pt able to demonstrate carryover of strategies to conversational exchange with clear vocal quality and no c/o strain. Pt is pleased with current level and agreeable to d/c. Improvements  endorsed via PROM ratings.   OBJECTIVE IMPAIRMENTS: include voice disorder and dysphagia. These impairments are limiting patient from effectively communicating at home and in community and safety when swallowing. Factors affecting potential to achieve goals and functional outcome are co-morbidities and previous level of function. Patient will benefit from skilled SLP services to address above impairments and improve overall function.  REHAB POTENTIAL: Good   GOALS: Goals reviewed with patient? Yes  SHORT TERM GOALS: Target date: 07/18/2023  Pt will complete MBSS to evaluate current swallow function Baseline: Goal status: MET  2.  Pt will perform recommended swallow exercises with rare min A x 2 sessions Baseline:  Goal status: DEFERRED - WNL swallow  3.  Pt will utilize recommended swallow precautions with rare min A x2 sessions  Baseline:  Goal status: DEFERRED - WNL swallow  4.  Pt will reduce throat clearing by 50% by STG date Baseline: > 7 Goal status: NOT MET  5.  Pt will complete recommended voice exercises with occasional min A x2 sessions (pending ENT evaluation) Baseline:  Goal status:  PARTIALLY MET   LONG TERM GOALS: Target date: 09/12/2023  Pt will carryover recommended swallow exercises/precautions > 1 week with mod I   Baseline:  Goal status: DEFERRED - WNL swallow  2.  Pt will demonstrate clear voicing on structured tasks given rare min A x2 sessions  Baseline: 08/22/2023, 08/31/2023 Goal status: MET  3.  Pt will carryover clear voicing in unstructured conversations given rare min A x2 sessions  Baseline: 08/22/23, 08/31/23 Goal status: MET  4.  Pt will report improved voice and swallow functioning via PROMS (see above) by 2 pts on each  Baseline: V-RQOL: 17, EAT-10: 8, RSI: 25 (suggestive of LPR > 13), and Pill-5 :12  Goal status: PARTIALLY MET: V-RQOL: 18 , EAT-10: 4, RSI: 12, and Pill-5 : 6   PLAN:  SLP FREQUENCY: 2x/week  SLP DURATION: 12  weeks  PLANNED INTERVENTIONS: Aspiration precaution training, Pharyngeal strengthening exercises, Diet toleration management , Trials of upgraded texture/liquids, Internal/external aids, Oral motor exercises, Functional tasks, Multimodal communication approach, SLP instruction and feedback, Compensatory strategies, Patient/family education, [phone] Treatment of speech (30 or 45 min) , and [id number] Treatment of swallowing function    [employer], Student-SLP 08/31/2023, 10:22 AM  SPEECH THERAPY DISCHARGE SUMMARY  Visits from Start of Care: 7  Current functional level related to goals / functional outcomes: Pt reports improved vocal quality, consistent implementation of strategies/recommendations to address globus sensation, reflux, vocal quality. Tells SLP family members have commented on vocal improvement.    Remaining deficits: Mild dysphonia, laryngopharyngeal reflux   Education / Equipment: See "tx" section   Patient agrees to discharge. Patient goals were met. Patient is being discharged due to being pleased with the current functional level.

## 2023-08-31 NOTE — Progress Notes (Signed)
 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 Will bring bottle to verify dose, Disp: , Rfl:    famotidine (PEPCID) 40 MG tablet, Take 1 tablet (40 mg total) by mouth at bedtime., Disp: 30 tablet, Rfl: 3   fluticasone (FLONASE) 50 MCG/ACT nasal spray, Place 2 sprays into both nostrils daily., Disp: 16 g, Rfl: 6   omeprazole (PRILOSEC) 40 MG capsule, Take 1 capsule (40 mg total) by mouth daily. Take 30 min before breakfast, Disp: 30 capsule, Rfl: 3   potassium chloride (KLOR-CON) 20 MEQ packet, Take 20 mEq by mouth daily., Disp: , Rfl:    sodium chloride (OCEAN) 0.65 % SOLN nasal spray, Place 1 spray into both nostrils as needed., Disp: 30 mL, Rfl: 5   XARELTO 20 MG TABS tablet, Take 20 mg by mouth daily., Disp: , Rfl:   Past Medical History: Past Medical History:  Diagnosis Date   Aortic aneurysm (HCC)    Arthritis    Left Knee   Cancer (HCC) 2022   Skin Cancer   GERD (gastroesophageal reflux disease)    Headache    Migraines   Heart murmur    Hyperlipidemia    Hypertension    Peripheral vascular disease (HCC)    Thoracic Aortic Aneurysm   Vertigo     Tobacco Use: Social History   Tobacco Use   Smoking Status Former   Types: Cigarettes  Smokeless Tobacco Never  Tobacco Comments   Pt quit smoking cigarettes in 2005. 1 pack of cigarettes per week.    Labs: Review Flowsheet  More data exists      Latest Ref Rng & Units 04/27/2023 04/28/2023 04/29/2023 04/30/2023 05/01/2023  Labs for ITP Cardiac and Pulmonary Rehab  PH, Arterial 7.35 - 7.45 7.484  7.486  7.503  7.536  7.547  7.440  7.321  7.368  7.419  7.308  7.298  7.285  7.283  7.347  7.385  7.396  7.402  7.403  7.403  7.458  7.455  7.458  7.482  7.512  7.397  7.336  7.394  7.403  7.387  7.378  7.399  7.429  7.395  7.462  7.472  7.379  7.527  7.476  7.523  7.533  7.574  7.413  7.419  7.439   PCO2 arterial 32 - 48 mmHg 44.0  42.9  38.8  33.3  27.5  25.2  27.1  22.8  30.2  47.0  52.2  55.2  56.4  50.1  46.5  56.2  53.3  51.8  52.5  43.1  44.7  44.4  44.8  40.8  37.6  66.0  58.3  55.8  57.0  58.6  55.4  49.3  50.8  45.6  46.5  57.0  36.7  38.7  35.6  35.7  32.9  51.6  49.8  50.6   Bicarbonate 20.0 - 28.0 mmol/L 33.1  32.5  30.6  28.3  24.0  17.3  14.1  13.2  19.5  23.7  25.7  26.4  26.9  27.7  28.0  34.6  33.3  32.4  32.8  30.6  31.6  31.5  33.6  32.8  23.2  35.3  35.7  34.9  34.3  34.6  34.3  32.8  31.2  32.6  34.0  33.6  30.6  28.7  29.3  30.2  30.5  33.1  32.3  34.3   TCO2 22 - 32 mmol/L 34  34  32  29  25  18  15  14  22  20  25  27  28  29  29  29  36  35  34  34  32  33  33  35  34   24  37  37  37  36  36  36  34  33  34  31  35  35  32  30  30  31   32  35  34  36   Acid-base deficit 0.0 - 2.0 mmol/L 6.0  11.0  11.0  4.0  3.0  1.0  1.0  - 2.0  - -  O2 Saturation % 98  98  99  99  100  100  100  100  100  99  99  94  95  98  100  99  99  98  95  89.9  98  99  99  66.8  100  95  100  95  98  98  97  98  64.9  99  99  99  100  100  68.9  100  100  100  98  100  100  98  99  64  99     Details       Multiple values from one day are sorted in reverse-chronological order         Capillary Blood Glucose: Lab Results  Component  Value Date   GLUCAP 182 (H) 05/01/2023   GLUCAP 132 (H) 05/01/2023   GLUCAP 146 (H) 05/01/2023   GLUCAP 120 (H) 05/01/2023   GLUCAP 143 (H) 05/01/2023     Exercise Target Goals: Exercise Program Goal: Individual exercise prescription set using results from initial 6 min walk test and THRR while considering  patient's activity barriers and safety.   Exercise Prescription Goal: Initial exercise prescription builds to 30-45 minutes a day of aerobic activity, 2-3 days per week.  Home exercise guidelines will be given to patient during program as part of exercise prescription that the participant will acknowledge.  Activity Barriers & Risk Stratification:  Activity Barriers & Cardiac Risk Stratification - 07/11/23 1209       Activity Barriers & Cardiac Risk Stratification   Activity Barriers Balance Concerns;Joint Problems;Deconditioning;Assistive Device;Decreased Ventricular Function;Neck/Spine Problems;Back Problems    Cardiac Risk Stratification High             6 Minute Walk:  6 Minute Walk     Row Name 07/11/23 1308         6 Minute Walk   Phase Initial     Distance 668 feet     Walk Time 6 minutes     # of  Rest Breaks 0     MPH 1.3     METS 2.11     RPE 11     Perceived Dyspnea  0     VO2 Peak 7.4     Symptoms No     Resting HR 83 bpm     Resting BP 104/70     Resting Oxygen Saturation  100 %     Exercise Oxygen Saturation  during 6 min walk 99 %     Max Ex. HR 98 bpm     Max Ex. BP 110/68     2 Minute Post BP 106/64              Oxygen Initial Assessment:   Oxygen Re-Evaluation:   Oxygen Discharge (Final Oxygen Re-Evaluation):   Initial Exercise Prescription:  Initial Exercise Prescription - 07/11/23 1200       Date of Initial Exercise RX and Referring Provider   Date 07/11/23    Expected Discharge Date 10/04/22      Recumbant Bike   Level 1    RPM 60    Watts 20    Minutes 15    METs 2.1      NuStep   Level 1    SPM 75     Minutes 15    METs 2.1      Prescription Details   Frequency (times per week) 3    Duration Progress to 30 minutes of continuous aerobic without signs/symptoms of physical distress      Intensity   THRR 40-80% of Max Heartrate 64-129    Ratings of Perceived Exertion 11-13    Perceived Dyspnea 0-4      Progression   Progression Continue progressive overload as per policy without signs/symptoms or physical distress.      Resistance Training   Training Prescription Yes    Weight 3 lbs    Reps 10-15             Perform Capillary Blood Glucose checks as needed.  Exercise Prescription Changes:   Exercise Prescription Changes     Row Name 07/17/23 1652 08/04/23 1636 08/09/23 1617 08/30/23 1619       Response to Exercise   Blood Pressure (Admit) 96/64 118/68 112/62 104/60    Blood Pressure (Exercise) 132/64 112/70 110/70 --    Blood Pressure (Exit) 104/60 102/64 110/68 102/60    Heart Rate (Admit) 84 bpm 76 bpm 67 bpm 65 bpm    Heart Rate (Exercise) 96 bpm 111 bpm 117 bpm 106 bpm    Heart Rate (Exit) 1.73 bpm 85 bpm 83 bpm 74 bpm    Rating of Perceived Exertion (Exercise) 10 13 12  11.5    Perceived Dyspnea (Exercise) 0 0 0 0    Symptoms 0 0 0 0    Comments Pt first day in the [employer] program Reviewed MET's, goals and home ExRx Reviewed Home ExRx Reviewed MET's and goals    Duration Progress to 30 minutes of  aerobic without signs/symptoms of physical distress Progress to 30 minutes of  aerobic without signs/symptoms of physical distress Progress to 30 minutes of  aerobic without signs/symptoms of physical distress Progress to 30 minutes of  aerobic without signs/symptoms of physical distress    Intensity THRR unchanged THRR unchanged THRR unchanged THRR unchanged      Progression   Progression Continue to progress workloads to maintain intensity without signs/symptoms of physical distress. Continue to progress workloads to maintain intensity without signs/symptoms  of  physical distress. Continue to progress workloads to maintain intensity without signs/symptoms of physical distress. Continue to progress workloads to maintain intensity without signs/symptoms of physical distress.    Average METs 1.65 2.45 2.3 2.85      Resistance Training   Training Prescription Yes Yes No No    Weight 3 lbs 3 lbs -- --    Reps 10-15 10-15 -- --    Time 10 Minutes 10 Minutes -- --      Recumbant Bike   Level 1 1 2 4     RPM 44 78 70 55    Watts 12 20 17  34    Minutes 15 15 -- --    METs 1.9 2.2 2.1 2.9      NuStep   Level 1 1 1 4     SPM 59 126 114 126    Minutes 15 15 15 15     METs 1.4 2.7 2.5 2.8      Home Exercise Plan   Plans to continue exercise at -- -- Home (comment) Home (comment)    Frequency -- -- Add 3 additional days to program exercise sessions. Add 3 additional days to program exercise sessions.    Initial Home Exercises Provided -- -- 08/09/23 08/09/23             Exercise Comments:   Exercise Comments     Row Name 07/17/23 1657 08/04/23 1640 08/09/23 1626 08/30/23 1624     Exercise Comments Pt first day in the [facility] program. Pt tolerated exercise well with an average MET level of 1.65. Pt is off to a good start and is learning his THRR, RPE and ExRx Reviewed MET's and goals. Pt tolerated exercise well with an average MET level of 2.45. Pt is feeling good and is feeling an increase in energy at home and is able to do more around the house. He feels good with his progress so far and will work on gradual increases going forward. Reviewed Home ExRx. Pt has bought some handweights for home use and will start slow. He plans to exercise by walking and hand weights for 30-45 mins per session 2-3 days at home Reviewed MET's and goals. Pt tolerated exercise well with an average MET level of 2.85. Pt is feeling good about his goals and is increasing MET's and WL's. He also wanted to be able to do more around the house and walk more. He is doing much  better             Exercise Goals and Review:   Exercise Goals     Row Name 07/11/23 1118             Exercise Goals   Increase Physical Activity Yes       Intervention Provide advice, education, support and counseling about physical activity/exercise needs.;Develop an individualized exercise prescription for aerobic and resistive training based on initial evaluation findings, risk stratification, comorbidities and participant's personal goals.       Expected Outcomes Short Term: Attend rehab on a regular basis to increase amount of physical activity.;Long Term: Exercising regularly at least 3-5 days a week.;Long Term: Add in home exercise to make exercise part of routine and to increase amount of physical activity.       Increase Strength and Stamina Yes       Intervention Provide advice, education, support and counseling about physical activity/exercise needs.;Develop an individualized exercise prescription for aerobic and resistive training based on initial evaluation  findings, risk stratification, comorbidities and participant's personal goals.       Expected Outcomes Short Term: Increase workloads from initial exercise prescription for resistance, speed, and METs.;Short Term: Perform resistance training exercises routinely during rehab and add in resistance training at home;Long Term: Improve cardiorespiratory fitness, muscular endurance and strength as measured by increased METs and functional capacity ( )       Able to understand and use rate of perceived exertion (RPE) scale Yes       Intervention Provide education and explanation on how to use RPE scale       Expected Outcomes Short Term: Able to use RPE daily in rehab to express subjective intensity level;Long Term:  Able to use RPE to guide intensity level when exercising independently       Knowledge and understanding of Target Heart Rate Range (THRR) Yes       Intervention Provide education and explanation of THRR  including how the numbers were predicted and where they are located for reference       Expected Outcomes Short Term: Able to state/look up THRR;Long Term: Able to use THRR to govern intensity when exercising independently;Short Term: Able to use daily as guideline for intensity in rehab       Understanding of Exercise Prescription Yes       Intervention Provide education, explanation, and written materials on patient's individual exercise prescription       Expected Outcomes Short Term: Able to explain program exercise prescription;Long Term: Able to explain home exercise prescription to exercise independently                Exercise Goals Re-Evaluation :  Exercise Goals Re-Evaluation     Row Name 07/17/23 1655 08/04/23 1638 08/30/23 1622         Exercise Goal Re-Evaluation   Exercise Goals Review Increase Physical Activity;Understanding of Exercise Prescription;Increase Strength and Stamina;Knowledge and understanding of Target Heart Rate Range (THRR);Able to understand and use rate of perceived exertion (RPE) scale Increase Physical Activity;Understanding of Exercise Prescription;Increase Strength and Stamina;Knowledge and understanding of Target Heart Rate Range (THRR);Able to understand and use rate of perceived exertion (RPE) scale Increase Physical Activity;Understanding of Exercise Prescription;Increase Strength and Stamina;Knowledge and understanding of Target Heart Rate Range (THRR);Able to understand and use rate of perceived exertion (RPE) scale     Comments Pt first day in the [facility] program. Pt tolerated exercise well with an average MET level of 1.65. Pt is off to a good start and is learning his THRR, RPE and ExRx Reviewed MET's and goals. Pt tolerated exercise well with an average MET level of 2.45. Pt is feeling good and is feeling an increase in energy at home and is able to do more around the house. He feels good with his progress so far and will work on gradual  increases going forward. Reviewed MET's and goals. Pt tolerated exercise well with an average MET level of 2.85. Pt is feeling good about his goals and is increasing MET's and WL's. He also wanted to be able to do more around the house and walk more. He is doing much better     Expected Outcomes Will continue to monitor pt and progress workloads as tolerated without sign or symptom Will continue to monitor pt and progress workloads as tolerated without sign or symptom Will continue to monitor pt and progress workloads as tolerated without sign or symptom  Discharge Exercise Prescription (Final Exercise Prescription Changes):  Exercise Prescription Changes - 08/30/23 1619       Response to Exercise   Blood Pressure (Admit) 104/60    Blood Pressure (Exit) 102/60    Heart Rate (Admit) 65 bpm    Heart Rate (Exercise) 106 bpm    Heart Rate (Exit) 74 bpm    Rating of Perceived Exertion (Exercise) 11.5    Perceived Dyspnea (Exercise) 0    Symptoms 0    Comments Reviewed MET's and goals    Duration Progress to 30 minutes of  aerobic without signs/symptoms of physical distress    Intensity THRR unchanged      Progression   Progression Continue to progress workloads to maintain intensity without signs/symptoms of physical distress.    Average METs 2.85      Resistance Training   Training Prescription No      Recumbant Bike   Level 4    RPM 55    Watts 34    METs 2.9      NuStep   Level 4    SPM 126    Minutes 15    METs 2.8      Home Exercise Plan   Plans to continue exercise at Home (comment)    Frequency Add 3 additional days to program exercise sessions.    Initial Home Exercises Provided 08/09/23             Nutrition:  Target Goals: Understanding of nutrition guidelines, daily intake of sodium 1500mg , cholesterol 200mg , calories 30% from fat and 7% or less from saturated fats, daily to have 5 or more servings of fruits and vegetables.  Biometrics:   Pre Biometrics - 07/11/23 1130       Pre Biometrics   Waist Circumference 42.5 inches    Hip Circumference 41.5 inches    Waist to Hip Ratio 1.02 %    Triceps Skinfold 20 mm    % Body Fat 30.4 %    Grip Strength 27 kg    Flexibility 0 in   could not reach   Single Leg Stand 4.25 seconds              Nutrition Therapy Plan and Nutrition Goals:  Nutrition Therapy & Goals - 08/11/23 1129       Nutrition Therapy   Diet Heart Healthy Diet    Drug/Food Interactions Statins/Certain Fruits      Personal Nutrition Goals   Nutrition Goal Patient to identify strategies for reducing cardiovascular risk by attending the [facility] education and nutrition series weekly.   goal in progress.   Personal Goal #2 Patient to improve diet quality by using the plate method as a guide for meal planning to include lean protein/plant protein, fruits, vegetables, whole grains, nonfat dairy as part of a well-balanced diet.   goal in progress.   Comments Goals in progress. Johnathan Anderson has medical history of CABG x2, aortic valve repalcement, HTN, Hyperlipidemia. He continues to attend the [facility] education and nutrition series and reports making heart healthy dietary changes. LDL is at goal. Triglycerides remain elevated. Patient will continue to benefit from participation in intensive cardiac rehab for nutrition, exercise, and lifestyle modification.      Intervention Plan   Intervention Prescribe, educate and counsel regarding individualized specific dietary modifications aiming towards targeted core components such as weight, hypertension, lipid management, diabetes, heart failure and other comorbidities.;Nutrition handout(s) given to patient.    Expected Outcomes Short Term Goal: Understand basic  principles of dietary content, such as calories, fat, sodium, cholesterol and nutrients.;Long Term Goal: Adherence to prescribed nutrition plan.             Nutrition Assessments:  Nutrition Assessments -  08/10/23 1243       Rate Your Plate Scores   Pre Score 62            MEDIFICTS Score Key: >=70 Need to make dietary changes  40-70 Heart Healthy Diet <= 40 Therapeutic Level Cholesterol Diet   Flowsheet Row INTENSIVE CARDIAC REHAB from 08/09/2023 in [facility]  Picture Your Plate Total Score on Admission 62      Picture Your Plate Scores: <04 Unhealthy dietary pattern with much room for improvement. 41-50 Dietary pattern unlikely to meet recommendations for good health and room for improvement. 51-60 More healthful dietary pattern, with some room for improvement.  >60 Healthy dietary pattern, although there may be some specific behaviors that could be improved.    Nutrition Goals Re-Evaluation:  Nutrition Goals Re-Evaluation     Row Name 08/11/23 1129             Goals   Current Weight 193 lb 9 oz (87.8 kg)       Comment triglycerides 388, LDL 25       Expected Outcome Goals in progress. Johnathan Anderson has medical history of CABG x2, aortic valve repalcement, HTN, Hyperlipidemia. He continues to attend the [facility] education and nutrition series and reports making heart healthy dietary changes. LDL is at goal. Triglycerides remain elevated. Patient will continue to benefit from participation in intensive cardiac rehab for nutrition, exercise, and lifestyle modification.                Nutrition Goals Re-Evaluation:  Nutrition Goals Re-Evaluation     Row Name 08/11/23 1129             Goals   Current Weight 193 lb 9 oz (87.8 kg)       Comment triglycerides 388, LDL 25       Expected Outcome Goals in progress. Johnathan Anderson has medical history of CABG x2, aortic valve repalcement, HTN, Hyperlipidemia. He continues to attend the [facility] education and nutrition series and reports making heart healthy dietary changes. LDL is at goal. Triglycerides remain elevated. Patient will continue to benefit from participation  in intensive cardiac rehab for nutrition, exercise, and lifestyle modification.                Nutrition Goals Discharge (Final Nutrition Goals Re-Evaluation):  Nutrition Goals Re-Evaluation - 08/11/23 1129       Goals   Current Weight 193 lb 9 oz (87.8 kg)    Comment triglycerides 388, LDL 25    Expected Outcome Goals in progress. Johnathan Anderson has medical history of CABG x2, aortic valve repalcement, HTN, Hyperlipidemia. He continues to attend the [facility] education and nutrition series and reports making heart healthy dietary changes. LDL is at goal. Triglycerides remain elevated. Patient will continue to benefit from participation in intensive cardiac rehab for nutrition, exercise, and lifestyle modification.             Psychosocial: Target Goals: Acknowledge presence or absence of significant depression and/or stress, maximize coping skills, provide positive support system. Participant is able to verbalize types and ability to use techniques and skills needed for reducing stress and depression.  Initial Review & Psychosocial Screening:  Initial Psych Review & Screening - 07/11/23 1443  Initial Review   Current issues with Current Sleep Concerns   sleeping issues due to uncomfortable head wound     Family Dynamics   Good Support System? Yes   Pt has wife Johnathan Anderson for support     Screening Interventions   Interventions Encouraged to exercise    Expected Outcomes Short Term goal: Identification and review with participant of any Quality of Life or Depression concerns found by scoring the questionnaire.;Long Term goal: The participant improves quality of Life and PHQ9 Scores as seen by post scores and/or verbalization of changes             Quality of Life Scores:  Quality of Life - 07/11/23 1444       Quality of Life   Select Quality of Life      Quality of Life Scores   Health/Function Pre 24.8 %    Socioeconomic Pre 23.31 %    Psych/Spiritual Pre 27.71 %     Family Pre 27.6 %    GLOBAL Pre 25.44 %            Scores of 19 and below usually indicate a poorer quality of life in these areas.  A difference of  2-3 points is a clinically meaningful difference.  A difference of 2-3 points in the total score of the Quality of Life Index has been associated with significant improvement in overall quality of life, self-image, physical symptoms, and general health in studies assessing change in quality of life.  PHQ-9: Review Flowsheet       07/11/2023 07/06/2022  Depression screen PHQ 2/9  Decreased Interest 1 0  Down, Depressed, Hopeless 0 0  PHQ - 2 Score 1 0  Altered sleeping 3 -  Tired, decreased energy 1 -  Change in appetite 0 -  Feeling bad or failure about yourself  0 -  Trouble concentrating 1 -  Moving slowly or fidgety/restless 2 -  Suicidal thoughts 0 -  PHQ-9 Score 8 -  Difficult doing work/chores Somewhat difficult -   Interpretation of Total Score  Total Score Depression Severity:  1-4 = Minimal depression, 5-9 = Mild depression, 10-14 = Moderate depression, 15-19 = Moderately severe depression, 20-27 = Severe depression   Psychosocial Evaluation and Intervention:   Psychosocial Re-Evaluation:  Psychosocial Re-Evaluation     Row Name 07/18/23 1302 08/08/23 1519 08/31/23 1539         Psychosocial Re-Evaluation   Current issues with Current Sleep Concerns Current Sleep Concerns Current Sleep Concerns;None Identified     Comments Johnathan Anderson started cardiac rehab on 07/17/23. Johnathan Anderson denies being depressed and is glad to be able to attend cardiac rehab. 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 Hopefully the pressure wound will continue to heal and Johnathan Anderson will be able to sleep better Johnathan Anderson has not voiced any increased concerns or stressors during exercise at cardiac rehab. Kristofor is happy that he is continuing to make progress post OHS surgery and his complicated post op  course. Johnathan Anderson has not voiced any increased concerns or stressors during exercise at cardiac rehab.     Interventions Encouraged to attend Cardiac Rehabilitation for the exercise;Relaxation education;Stress management education Encouraged to attend Cardiac Rehabilitation for the exercise;Relaxation education;Stress management education Encouraged to attend Cardiac Rehabilitation for the exercise;Relaxation education;Stress management education     Continue Psychosocial Services  Follow up required by staff Follow up required by staff Follow up required by staff  Psychosocial Discharge (Final Psychosocial Re-Evaluation):  Psychosocial Re-Evaluation - 08/31/23 1539       Psychosocial Re-Evaluation   Current issues with Current Sleep Concerns;None Identified    Comments Johnathan Anderson has not voiced any increased concerns or stressors during exercise at cardiac rehab.    Interventions Encouraged to attend Cardiac Rehabilitation for the exercise;Relaxation education;Stress management education    Continue Psychosocial Services  Follow up required by staff             Vocational Rehabilitation: Provide vocational rehab assistance to qualifying candidates.   Vocational Rehab Evaluation & Intervention:  Vocational Rehab - 07/11/23 1119       Initial Vocational Rehab Evaluation & Intervention   Assessment shows need for Vocational Rehabilitation No   Pt is on disability            Education: Education Goals: Education classes will be provided on a weekly basis, covering required topics. Participant will state understanding/return demonstration of topics presented.    Education     Row Name 07/17/23 1400     Education   Cardiac Education Topics Pritikin   Select Workshops     Workshops   Educator Exercise Physiologist   Select Exercise   Exercise Workshop Location manager and Fall Prevention   Instruction Review Code 1- Verbalizes Understanding   Class Start  Time 1400   Class Stop Time 1445   Class Time Calculation (min) 45 min    Row Name 07/21/23 1500     Education   Cardiac Education Topics Pritikin   Licensed conveyancer Nutrition   Nutrition Overview of the Pritikin Eating Plan   Instruction Review Code 1- Verbalizes Understanding   Class Start Time 1357   Class Stop Time 1444   Class Time Calculation (min) 47 min    Row Name 07/28/23 1500     Education   Cardiac Education Topics Pritikin   Nurse, children's   Educator Dietitian   Select Nutrition   Nutrition Other  Label Reading   Instruction Review Code 1- Verbalizes Understanding   Class Start Time 1358   Class Stop Time 1445   Class Time Calculation (min) 47 min    Row Name 07/31/23 1400     Education   Cardiac Education Topics Pritikin   Select Core Videos     Core Videos   Educator Nurse   Select Nutrition   Nutrition Becoming a Pritikin Chef   Instruction Review Code 1- Verbalizes Understanding   Class Start Time 1401   Class Stop Time 1436   Class Time Calculation (min) 35 min    Row Name 08/04/23 1500     Education   Cardiac Education Topics Pritikin   Glass blower/designer Nutrition   Nutrition Workshop Fueling a Forensic psychologist   Instruction Review Code 1- Tax inspector   Class Start Time 1400   Class Stop Time 1445   Class Time Calculation (min) 45 min    Row Name 08/09/23 1500     Education   Cardiac Education Topics Pritikin   Customer service manager   Weekly Topic [employer] Desserts   Instruction Review Code 1- Verbalizes Understanding   Class Start Time 1355   Class Stop Time 1433   Class Time Calculation (  min) 38 min    Row Name 08/11/23 1500     Education   Cardiac Education Topics Pritikin   Select Core Videos     Core Videos   Educator Dietitian   Select Nutrition    Nutrition Calorie Density   Instruction Review Code 1- Verbalizes Understanding   Class Start Time 1400   Class Stop Time 1435   Class Time Calculation (min) 35 min    Row Name 08/14/23 1500     Education   Cardiac Education Topics Pritikin   Banker Exercise Education   Exercise Education Exercise Action Plan   Instruction Review Code 1- Verbalizes Understanding   Class Start Time 1405   Class Stop Time 1455   Class Time Calculation (min) 50 min    Row Name 08/18/23 1400     Education   Cardiac Education Topics Pritikin   Psychologist, forensic Exercise Education   Exercise Education Move It!   Instruction Review Code 1- Verbalizes Understanding   Class Start Time 1400   Class Stop Time 1444   Class Time Calculation (min) 44 min    Row Name 08/21/23 1500     Education   Cardiac Education Topics Pritikin   Glass blower/designer Nutrition   Nutrition Workshop Targeting Your Nutrition Priorities   Instruction Review Code 1- Verbalizes Understanding   Class Start Time 1400   Class Stop Time 1440   Class Time Calculation (min) 40 min    Row Name 08/23/23 1500     Education   Cardiac Education Topics Pritikin   Customer service manager   Weekly Topic One-Pot Wonders   Instruction Review Code 1- Verbalizes Understanding   Class Start Time 1400   Class Stop Time 1445   Class Time Calculation (min) 45 min    Row Name 08/28/23 1600     Education   Cardiac Education Topics Pritikin   Geographical information systems officer Psychosocial   Psychosocial Workshop Focused Goals, Sustainable Changes   Instruction Review Code 1- Verbalizes Understanding   Class Start Time 1345   Class Stop Time 1443   Class Time Calculation (min)  58 min    Row Name 08/30/23 1600     Education   Cardiac Education Topics Pritikin   Customer service manager   Weekly Topic Comforting Weekend Breakfasts   Instruction Review Code 1- Verbalizes Understanding   Class Start Time 1400   Class Stop Time 1440   Class Time Calculation (min) 40 min    Row Name 09/01/23 1500     Education   Cardiac Education Topics Pritikin   Licensed conveyancer Nutrition   Nutrition Dining Out - Part 1   Instruction Review Code 1- Verbalizes Understanding   Class Start Time 1400   Class Stop Time 1440   Class Time Calculation (min) 40 min            Core Videos: Exercise    Move It!  Clinical staff conducted group or individual video education  with verbal and written material and guidebook.  Patient learns the recommended Pritikin exercise program. Exercise with the goal of living a long, healthy life. Some of the health benefits of exercise include controlled diabetes, healthier blood pressure levels, improved cholesterol levels, improved heart and lung capacity, improved sleep, and better body composition. Everyone should speak with their doctor before starting or changing an exercise routine.  Biomechanical Limitations Clinical staff conducted group or individual video education with verbal and written material and guidebook.  Patient learns how biomechanical limitations can impact exercise and how we can mitigate and possibly overcome limitations to have an impactful and balanced exercise routine.  Body Composition Clinical staff conducted group or individual video education with verbal and written material and guidebook.  Patient learns that body composition (ratio of muscle mass to fat mass) is a key component to assessing overall fitness, rather than body weight alone. Increased fat mass, especially visceral belly fat, can put [place] at increased risk for  metabolic syndrome, type 2 diabetes, heart disease, and even death. It is recommended to combine diet and exercise (cardiovascular and resistance training) to improve your body composition. Seek guidance from your physician and exercise physiologist before implementing an exercise routine.  Exercise Action Plan Clinical staff conducted group or individual video education with verbal and written material and guidebook.  Patient learns the recommended strategies to achieve and enjoy long-term exercise adherence, including variety, self-motivation, self-efficacy, and positive decision making. Benefits of exercise include fitness, good health, weight management, more energy, better sleep, less stress, and overall well-being.  Medical   Heart Disease Risk Reduction Clinical staff conducted group or individual video education with verbal and written material and guidebook.  Patient learns our heart is our most vital organ as it circulates oxygen, nutrients, white blood cells, and hormones throughout the entire body, and carries waste away. Data supports a plant-based eating plan like the Pritikin Program for its effectiveness in slowing progression of and reversing heart disease. The video provides a number of recommendations to address heart disease.   Metabolic Syndrome and Belly Fat  Clinical staff conducted group or individual video education with verbal and written material and guidebook.  Patient learns what metabolic syndrome is, how it leads to heart disease, and how one can reverse it and keep it from coming back. You have metabolic syndrome if you have 3 of the following 5 criteria: abdominal obesity, high blood pressure, high triglycerides, low HDL cholesterol, and high blood sugar.  Hypertension and Heart Disease Clinical staff conducted group or individual video education with verbal and written material and guidebook.  Patient learns that high blood pressure, or hypertension, is very common  in the [place]. Hypertension is largely due to excessive salt intake, but other important risk factors include being overweight, physical inactivity, drinking too much alcohol, smoking, and not eating enough potassium from fruits and vegetables. High blood pressure is a leading risk factor for heart attack, stroke, congestive heart failure, dementia, kidney failure, and premature death. Long-term effects of excessive salt intake include stiffening of the arteries and thickening of heart muscle and organ damage. Recommendations include ways to reduce hypertension and the risk of heart disease.  Diseases of Our Time - Focusing on Diabetes Clinical staff conducted group or individual video education with verbal and written material and guidebook.  Patient learns why the best way to stop diseases of our time is prevention, through food and other lifestyle changes. Medicine (such as prescription pills and surgeries) is often  only a Band-Aid on the problem, not a long-term solution. Most common diseases of our time include obesity, type 2 diabetes, hypertension, heart disease, and cancer. The Pritikin Program is recommended and has been proven to help reduce, reverse, and/or prevent the damaging effects of metabolic syndrome.  Nutrition   Overview of the Pritikin Eating Plan  Clinical staff conducted group or individual video education with verbal and written material and guidebook.  Patient learns about the Pritikin Eating Plan for disease risk reduction. The Pritikin Eating Plan emphasizes a wide variety of unrefined, minimally-processed carbohydrates, like fruits, vegetables, whole grains, and legumes. Go, Caution, and Stop food choices are explained. Plant-based and lean animal proteins are emphasized. Rationale provided for low sodium intake for blood pressure control, low added sugars for blood sugar stabilization, and low added fats and oils for coronary artery disease risk reduction and weight  management.  Calorie Density  Clinical staff conducted group or individual video education with verbal and written material and guidebook.  Patient learns about calorie density and how it impacts the Pritikin Eating Plan. Knowing the characteristics of the food you choose will help you decide whether those foods will lead to weight gain or weight loss, and whether you want to consume more or less of them. Weight loss is usually a side effect of the Pritikin Eating Plan because of its focus on low calorie-dense foods.  Label Reading  Clinical staff conducted group or individual video education with verbal and written material and guidebook.  Patient learns about the Pritikin recommended label reading guidelines and corresponding recommendations regarding calorie density, added sugars, sodium content, and whole grains.  Dining Out - Part 1  Clinical staff conducted group or individual video education with verbal and written material and guidebook.  Patient learns that restaurant meals can be sabotaging because they can be so high in calories, fat, sodium, and/or sugar. Patient learns recommended strategies on how to positively address this and avoid unhealthy pitfalls.  Facts on Fats  Clinical staff conducted group or individual video education with verbal and written material and guidebook.  Patient learns that lifestyle modifications can be just as effective, if not more so, as many medications for lowering your risk of heart disease. A Pritikin lifestyle can help to reduce your risk of inflammation and atherosclerosis (cholesterol build-up, or plaque, in the artery walls). Lifestyle interventions such as dietary choices and physical activity address the cause of atherosclerosis. A review of the types of fats and their impact on blood cholesterol levels, along with dietary recommendations to reduce fat intake is also included.  Nutrition Action Plan  Clinical staff conducted group or individual  video education with verbal and written material and guidebook.  Patient learns how to incorporate Pritikin recommendations into their lifestyle. Recommendations include planning and keeping personal health goals in mind as an important part of their success.  Healthy Mind-Set    Healthy Minds, Bodies, Hearts  Clinical staff conducted group or individual video education with verbal and written material and guidebook.  Patient learns how to identify when they are stressed. Video will discuss the impact of that stress, as well as the many benefits of stress management. Patient will also be introduced to stress management techniques. The way we think, act, and feel has an impact on our hearts.  How Our Thoughts Can Heal Our Hearts  Clinical staff conducted group or individual video education with verbal and written material and guidebook.  Patient learns that negative thoughts can cause depression  and anxiety. This can result in negative lifestyle behavior and serious health problems. Cognitive behavioral therapy is an effective method to help control our thoughts in order to change and improve our emotional outlook.  Additional Videos:  Exercise    Improving Performance  Clinical staff conducted group or individual video education with verbal and written material and guidebook.  Patient learns to use a non-linear approach by alternating intensity levels and lengths of time spent exercising to help burn more calories and lose more body fat. Cardiovascular exercise helps improve heart health, metabolism, hormonal balance, blood sugar control, and recovery from fatigue. Resistance training improves strength, endurance, balance, coordination, reaction time, metabolism, and muscle mass. Flexibility exercise improves circulation, posture, and balance. Seek guidance from your physician and exercise physiologist before implementing an exercise routine and learn your capabilities and proper form for all  exercise.  Introduction to Yoga  Clinical staff conducted group or individual video education with verbal and written material and guidebook.  Patient learns about yoga, a discipline of the coming together of mind, breath, and body. The benefits of yoga include improved flexibility, improved range of motion, better posture and core strength, increased lung function, weight loss, and positive self-image. Yoga's heart health benefits include lowered blood pressure, healthier heart rate, decreased cholesterol and triglyceride levels, improved immune function, and reduced stress. Seek guidance from your physician and exercise physiologist before implementing an exercise routine and learn your capabilities and proper form for all exercise.  Medical   Aging: Enhancing Your Quality of Life  Clinical staff conducted group or individual video education with verbal and written material and guidebook.  Patient learns key strategies and recommendations to stay in good physical health and enhance quality of life, such as prevention strategies, having an advocate, securing a Health Care Proxy and Power of Attorney, and keeping a list of medications and system for tracking them. It also discusses how to avoid risk for bone loss.  Biology of Weight Control  Clinical staff conducted group or individual video education with verbal and written material and guidebook.  Patient learns that weight gain occurs because we consume more calories than we burn (eating more, moving less). Even if your body weight is normal, you may have higher ratios of fat compared to muscle mass. Too much body fat puts you at increased risk for cardiovascular disease, heart attack, stroke, type 2 diabetes, and obesity-related cancers. In addition to exercise, following the Pritikin Eating Plan can help reduce your risk.  Decoding Lab Results  Clinical staff conducted group or individual video education with verbal and written material and  guidebook.  Patient learns that lab test reflects one measurement whose values change over time and are influenced by many factors, including medication, stress, sleep, exercise, food, hydration, pre-existing medical conditions, and more. It is recommended to use the knowledge from this video to become more involved with your lab results and evaluate your numbers to speak with your doctor.   Diseases of Our Time - Overview  Clinical staff conducted group or individual video education with verbal and written material and guidebook.  Patient learns that according to the CDC, 50% to 70% of chronic diseases (such as obesity, type 2 diabetes, elevated lipids, hypertension, and heart disease) are avoidable through lifestyle improvements including healthier food choices, listening to satiety cues, and increased physical activity.  Sleep Disorders Clinical staff conducted group or individual video education with verbal and written material and guidebook.  Patient learns how good quality and duration  of sleep are important to overall health and well-being. Patient also learns about sleep disorders and how they impact health along with recommendations to address them, including discussing with a physician.  Nutrition  Dining Out - Part 2 Clinical staff conducted group or individual video education with verbal and written material and guidebook.  Patient learns how to plan ahead and communicate in order to maximize their dining experience in a healthy and nutritious manner. Included are recommended food choices based on the type of restaurant the patient is visiting.   Fueling a Banker conducted group or individual video education with verbal and written material and guidebook.  There is a strong connection between our food choices and our health. Diseases like obesity and type 2 diabetes are very prevalent and are in large-part due to lifestyle choices. The Pritikin Eating Plan  provides plenty of food and hunger-curbing satisfaction. It is easy to follow, affordable, and helps reduce health risks.  Menu Workshop  Clinical staff conducted group or individual video education with verbal and written material and guidebook.  Patient learns that restaurant meals can sabotage health goals because they are often packed with calories, fat, sodium, and sugar. Recommendations include strategies to plan ahead and to communicate with the manager, chef, or server to help order a healthier meal.  Planning Your Eating Strategy  Clinical staff conducted group or individual video education with verbal and written material and guidebook.  Patient learns about the Pritikin Eating Plan and its benefit of reducing the risk of disease. The Pritikin Eating Plan does not focus on calories. Instead, it emphasizes high-quality, nutrient-rich foods. By knowing the characteristics of the foods, we choose, we can determine their calorie density and make informed decisions.  Targeting Your Nutrition Priorities  Clinical staff conducted group or individual video education with verbal and written material and guidebook.  Patient learns that lifestyle habits have a tremendous impact on disease risk and progression. This video provides eating and physical activity recommendations based on your personal health goals, such as reducing LDL cholesterol, losing weight, preventing or controlling type 2 diabetes, and reducing high blood pressure.  Vitamins and Minerals  Clinical staff conducted group or individual video education with verbal and written material and guidebook.  Patient learns different ways to obtain key vitamins and minerals, including through a recommended healthy diet. It is important to discuss all supplements you take with your doctor.   Healthy Mind-Set    Smoking Cessation  Clinical staff conducted group or individual video education with verbal and written material and guidebook.   Patient learns that cigarette smoking and tobacco addiction pose a serious health risk which affects millions of people. Stopping smoking will significantly reduce the risk of heart disease, lung disease, and many forms of cancer. Recommended strategies for quitting are covered, including working with your doctor to develop a successful plan.  Culinary   Becoming a Set designer conducted group or individual video education with verbal and written material and guidebook.  Patient learns that cooking at home can be healthy, cost-effective, quick, and puts them in control. Keys to cooking healthy recipes will include looking at your recipe, assessing your equipment needs, planning ahead, making it simple, choosing cost-effective seasonal ingredients, and limiting the use of added fats, salts, and sugars.  Cooking - Breakfast and Snacks  Clinical staff conducted group or individual video education with verbal and written material and guidebook.  Patient learns how important breakfast is to  satiety and nutrition through the entire day. Recommendations include key foods to eat during breakfast to help stabilize blood sugar levels and to prevent overeating at meals later in the day. Planning ahead is also a key component.  Cooking - Educational psychologist conducted group or individual video education with verbal and written material and guidebook.  Patient learns eating strategies to improve overall health, including an approach to cook more at home. Recommendations include thinking of animal protein as a side on your plate rather than center stage and focusing instead on lower calorie dense options like vegetables, fruits, whole grains, and plant-based proteins, such as beans. Making sauces in large quantities to freeze for later and leaving the skin on your vegetables are also recommended to maximize your experience.  Cooking - Healthy Salads and Dressing Clinical staff  conducted group or individual video education with verbal and written material and guidebook.  Patient learns that vegetables, fruits, whole grains, and legumes are the foundations of the Pritikin Eating Plan. Recommendations include how to incorporate each of these in flavorful and healthy salads, and how to create homemade salad dressings. Proper handling of ingredients is also covered. Cooking - Soups and [employer] - Soups and Desserts Clinical staff conducted group or individual video education with verbal and written material and guidebook.  Patient learns that Pritikin soups and desserts make for easy, nutritious, and delicious snacks and meal components that are low in sodium, fat, sugar, and calorie density, while high in vitamins, minerals, and filling fiber. Recommendations include simple and healthy ideas for soups and desserts.   Overview     The Pritikin Solution Program Overview Clinical staff conducted group or individual video education with verbal and written material and guidebook.  Patient learns that the results of the Pritikin Program have been documented in more than 100 articles published in peer-reviewed journals, and the benefits include reducing risk factors for (and, in some cases, even reversing) high cholesterol, high blood pressure, type 2 diabetes, obesity, and more! An overview of the three key pillars of the Pritikin Program will be covered: eating well, doing regular exercise, and having a healthy mind-set.  WORKSHOPS  Exercise: Exercise Basics: Building Your Action Plan Clinical staff led group instruction and group discussion with PowerPoint presentation and patient guidebook. To enhance the learning environment the use of posters, models and videos may be added. At the conclusion of this workshop, patients will comprehend the difference between physical activity and exercise, as well as the benefits of incorporating both, into their routine. Patients will  understand the FITT (Frequency, Intensity, Time, and Type) principle and how to use it to build an exercise action plan. In addition, safety concerns and other considerations for exercise and cardiac rehab will be addressed by the presenter. The purpose of this lesson is to promote a comprehensive and effective weekly exercise routine in order to improve patients' overall level of fitness.   Managing Heart Disease: Your Path to a Healthier Heart Clinical staff led group instruction and group discussion with PowerPoint presentation and patient guidebook. To enhance the learning environment the use of posters, models and videos may be added.At the conclusion of this workshop, patients will understand the anatomy and physiology of the heart. Additionally, they will understand how Pritikin's three pillars impact the risk factors, the progression, and the management of heart disease.  The purpose of this lesson is to provide a high-level overview of the heart, heart disease, and how the Pritikin  lifestyle positively impacts risk factors.  Exercise Biomechanics Clinical staff led group instruction and group discussion with PowerPoint presentation and patient guidebook. To enhance the learning environment the use of posters, models and videos may be added. Patients will learn how the structural parts of their bodies function and how these functions impact their daily activities, movement, and exercise. Patients will learn how to promote a neutral spine, learn how to manage pain, and identify ways to improve their physical movement in order to promote healthy living. The purpose of this lesson is to expose patients to common physical limitations that impact physical activity. Participants will learn practical ways to adapt and manage aches and pains, and to minimize their effect on regular exercise. Patients will learn how to maintain good posture while sitting, walking, and lifting.  Balance Training  and Fall Prevention  Clinical staff led group instruction and group discussion with PowerPoint presentation and patient guidebook. To enhance the learning environment the use of posters, models and videos may be added. At the conclusion of this workshop, patients will understand the importance of their sensorimotor skills (vision, proprioception, and the vestibular system) in maintaining their ability to balance as they age. Patients will apply a variety of balancing exercises that are appropriate for their current level of function. Patients will understand the common causes for poor balance, possible solutions to these problems, and ways to modify their physical environment in order to minimize their fall risk. The purpose of this lesson is to teach patients about the importance of maintaining balance as they age and ways to minimize their risk of falling.  WORKSHOPS   Nutrition:  Fueling a Ship broker led group instruction and group discussion with PowerPoint presentation and patient guidebook. To enhance the learning environment the use of posters, models and videos may be added. Patients will review the foundational principles of the Pritikin Eating Plan and understand what constitutes a serving size in each of the food groups. Patients will also learn Pritikin-friendly foods that are better choices when away from home and review make-ahead meal and snack options. Calorie density will be reviewed and applied to three nutrition priorities: weight maintenance, weight loss, and weight gain. The purpose of this lesson is to reinforce (in a group setting) the key concepts around what patients are recommended to eat and how to apply these guidelines when away from home by planning and selecting Pritikin-friendly options. Patients will understand how calorie density may be adjusted for different weight management goals.  Mindful Eating  Clinical staff led group instruction and group  discussion with PowerPoint presentation and patient guidebook. To enhance the learning environment the use of posters, models and videos may be added. Patients will briefly review the concepts of the Pritikin Eating Plan and the importance of low-calorie dense foods. The concept of mindful eating will be introduced as well as the importance of paying attention to internal hunger signals. Triggers for non-hunger eating and techniques for dealing with triggers will be explored. The purpose of this lesson is to provide patients with the opportunity to review the basic principles of the Pritikin Eating Plan, discuss the value of eating mindfully and how to measure internal cues of hunger and fullness using the Hunger Scale. Patients will also discuss reasons for non-hunger eating and learn strategies to use for controlling emotional eating.  Targeting Your Nutrition Priorities Clinical staff led group instruction and group discussion with PowerPoint presentation and patient guidebook. To enhance the learning environment the use of  posters, models and videos may be added. Patients will learn how to determine their genetic susceptibility to disease by reviewing their family history. Patients will gain insight into the importance of diet as part of an overall healthy lifestyle in mitigating the impact of genetics and other environmental insults. The purpose of this lesson is to provide patients with the opportunity to assess their personal nutrition priorities by looking at their family history, their own health history and current risk factors. Patients will also be able to discuss ways of prioritizing and modifying the Pritikin Eating Plan for their highest risk areas  Menu  Clinical staff led group instruction and group discussion with PowerPoint presentation and patient guidebook. To enhance the learning environment the use of posters, models and videos may be added. Using menus brought in from [employer],  or printed from [employer], patients will apply the Pritikin dining out guidelines that were presented in the [employer] video. Patients will also be able to practice these guidelines in a variety of provided scenarios. The purpose of this lesson is to provide patients with the opportunity to practice hands-on learning of the Pritikin Dining Out guidelines with actual menus and practice scenarios.  Label Reading Clinical staff led group instruction and group discussion with PowerPoint presentation and patient guidebook. To enhance the learning environment the use of posters, models and videos may be added. Patients will review and discuss the Pritikin label reading guidelines presented in Pritikin's Label Reading Educational series video. Using fool labels brought in from local grocery stores and markets, patients will apply the label reading guidelines and determine if the packaged food meet the Pritikin guidelines. The purpose of this lesson is to provide patients with the opportunity to review, discuss, and practice hands-on learning of the Pritikin Label Reading guidelines with actual packaged food labels. Cooking School  Pritikin's [employer] are designed to teach patients ways to prepare quick, simple, and affordable recipes at home. The importance of nutrition's role in chronic disease risk reduction is reflected in its emphasis in the overall Pritikin program. By learning how to prepare essential core Pritikin Eating Plan recipes, patients will increase control over what they eat; be able to customize the flavor of foods without the use of added salt, sugar, or fat; and improve the quality of the food they consume. By learning a set of core recipes which are easily assembled, quickly prepared, and affordable, patients are more likely to prepare more healthy foods at home. These workshops focus on convenient breakfasts, simple entres, side dishes, and desserts which  can be prepared with minimal effort and are consistent with nutrition recommendations for cardiovascular risk reduction. Cooking [employer] are taught by a Armed forces logistics/support/administrative officer (RD) who has been trained by the [employer]. The chef or RD has a clear understanding of the importance of minimizing - if not completely eliminating - added fat, sugar, and sodium in recipes. Throughout the series of Cooking School Workshop sessions, patients will learn about healthy ingredients and efficient methods of cooking to build confidence in their capability to prepare    Cooking School weekly topics:  Adding Flavor- Sodium-Free  Fast and Healthy Breakfasts  Powerhouse Plant-Based Proteins  Satisfying Salads and Dressings  Simple Sides and Sauces  International Cuisine-Spotlight on the [employer] Zones  Delicious Desserts  Savory Soups  [employer] - Meals in a Astronomer Appetizers and Snacks  Comforting Weekend Breakfasts  One-Pot Wonders   Fast  Evening Meals  Easy Entertaining  Personalizing Your Pritikin Plate  WORKSHOPS   Healthy Mindset (Psychosocial):  Focused Goals, Sustainable Changes Clinical staff led group instruction and group discussion with PowerPoint presentation and patient guidebook. To enhance the learning environment the use of posters, models and videos may be added. Patients will be able to apply effective goal setting strategies to establish at least one personal goal, and then take consistent, meaningful action toward that goal. They will learn to identify common barriers to achieving personal goals and develop strategies to overcome them. Patients will also gain an understanding of how our mind-set can impact our ability to achieve goals and the importance of cultivating a positive and growth-oriented mind-set. The purpose of this lesson is to provide patients with a deeper understanding of how to set and achieve personal goals, as well as the tools  and strategies needed to overcome common obstacles which may arise along the way.  From Head to Heart: The Power of a Healthy Outlook  Clinical staff led group instruction and group discussion with PowerPoint presentation and patient guidebook. To enhance the learning environment the use of posters, models and videos may be added. Patients will be able to recognize and describe the impact of emotions and mood on physical health. They will discover the importance of self-care and explore self-care practices which may work for them. Patients will also learn how to utilize the 4 C's to cultivate a healthier outlook and better manage stress and challenges. The purpose of this lesson is to demonstrate to patients how a healthy outlook is an essential part of maintaining good health, especially as they continue their cardiac rehab journey.  Healthy Sleep for a Healthy Heart Clinical staff led group instruction and group discussion with PowerPoint presentation and patient guidebook. To enhance the learning environment the use of posters, models and videos may be added. At the conclusion of this workshop, patients will be able to demonstrate knowledge of the importance of sleep to overall health, well-being, and quality of life. They will understand the symptoms of, and treatments for, common sleep disorders. Patients will also be able to identify daytime and nighttime behaviors which impact sleep, and they will be able to apply these tools to help manage sleep-related challenges. The purpose of this lesson is to provide patients with a general overview of sleep and outline the importance of quality sleep. Patients will learn about a few of the most common sleep disorders. Patients will also be introduced to the concept of "sleep hygiene," and discover ways to self-manage certain sleeping problems through simple daily behavior changes. Finally, the workshop will motivate patients by clarifying the links between  quality sleep and their goals of heart-healthy living.   Recognizing and Reducing Stress Clinical staff led group instruction and group discussion with PowerPoint presentation and patient guidebook. To enhance the learning environment the use of posters, models and videos may be added. At the conclusion of this workshop, patients will be able to understand the types of stress reactions, differentiate between acute and chronic stress, and recognize the impact that chronic stress has on their health. They will also be able to apply different coping mechanisms, such as reframing negative self-talk. Patients will have the opportunity to practice a variety of stress management techniques, such as deep abdominal breathing, progressive muscle relaxation, and/or guided imagery.  The purpose of this lesson is to educate patients on the role of stress in their lives and to provide healthy techniques for coping with  it.  Learning Barriers/Preferences:  Learning Barriers/Preferences - 07/11/23 1445       Learning Barriers/Preferences   Learning Barriers Sight   wears reading glasses   Learning Preferences Video;Written Material;Computer/Internet;Pictoral             Education Topics:  Knowledge Questionnaire Score:  Knowledge Questionnaire Score - 07/11/23 1445       Knowledge Questionnaire Score   Pre Score 21/24             Core Components/Risk Factors/Patient Goals at Admission:  Personal Goals and Risk Factors at Admission - 07/11/23 1445       Core Components/Risk Factors/Patient Goals on Admission   Hypertension Yes    Intervention Provide education on lifestyle modifcations including regular physical activity/exercise, weight management, moderate sodium restriction and increased consumption of fresh fruit, vegetables, and low fat dairy, alcohol moderation, and smoking cessation.;Monitor prescription use compliance.    Expected Outcomes Short Term: Continued assessment and  intervention until BP is < 140/27mm HG in hypertensive participants. < 130/52mm HG in hypertensive participants with diabetes, heart failure or chronic kidney disease.;Long Term: Maintenance of blood pressure at goal levels.    Lipids Yes    Intervention Provide education and support for participant on nutrition & aerobic/resistive exercise along with prescribed medications to achieve LDL 70mg , HDL >40mg .    Expected Outcomes Short Term: Participant states understanding of desired cholesterol values and is compliant with medications prescribed. Participant is following exercise prescription and nutrition guidelines.;Long Term: Cholesterol controlled with medications as prescribed, with individualized exercise RX and with personalized nutrition plan. Value goals: LDL < 70mg , HDL > 40 mg.             Core Components/Risk Factors/Patient Goals Review:   Goals and Risk Factor Review     Row Name 07/18/23 1311 08/08/23 1523 08/31/23 1542         Core Components/Risk Factors/Patient Goals Review   Personal Goals Review Weight Management/Obesity;Hypertension;Lipids Weight Management/Obesity;Hypertension;Lipids Weight Management/Obesity;Hypertension;Lipids     Review Johnathan Anderson started cardiac rehab on 07/17/23. Johnathan Anderson did well with exercise. VSS. Johnathan Anderson uses a cane for stabilty. Johnathan Anderson continues to do  well with exercise. VSS. Johnathan Anderson continues to do  well with exercise. VSS. Johnathan Anderson has gained 2.5 kg since starting the program. Johnathan Anderson has increased his workloads. Johnathan Anderson will tenatively complete cardiac rehab on 10/04/23.     Expected Outcomes Johnathan Anderson will continue to participate in cardiac rehab for exercise, nutrition and lifestyle modifications Johnathan Anderson will continue to participate in cardiac rehab for exercise, nutrition and lifestyle modifications Johnathan Anderson will continue to participate in cardiac rehab for exercise, nutrition and lifestyle modifications              Core Components/Risk  Factors/Patient Goals at Discharge (Final Review):   Goals and Risk Factor Review - 08/31/23 1542       Core Components/Risk Factors/Patient Goals Review   Personal Goals Review Weight Management/Obesity;Hypertension;Lipids    Review Johnathan Anderson continues to do  well with exercise. VSS. Johnathan Anderson has gained 2.5 kg since starting the program. Johnathan Anderson has increased his workloads. Johnathan Anderson will tenatively complete cardiac rehab on 10/04/23.    Expected Outcomes Johnathan Anderson will continue to participate in cardiac rehab for exercise, nutrition and lifestyle modifications             ITP Comments:  ITP Comments     Row Name 07/11/23 1115 07/18/23 1300 08/08/23 1518 08/31/23 1538     ITP Comments Johnathan Magic, Johnathan Anderson:  Medical Director.  Introduction to the Pritikin Education Program/Intensive cardiac Rehab.  [facility]ila orientation packet reviewed with the patient. 30 Day ITP Review. Destiny started cardiac rehab on 02/1//25. Hale did well with exercise. 30 Day ITP Review. Decarlo has good participation with  exercise at cardiac rehab when in attendance. 30 Day ITP Review. Ansley continues to have good participation with  exercise at cardiac rehab when in attendance.             Comments: See ITP comments.Thayer Headings RN BSN

## 2023-09-01 ENCOUNTER — Encounter (HOSPITAL_COMMUNITY)
Admission: RE | Admit: 2023-09-01 | Discharge: 2023-09-01 | Disposition: A | Discharge status: Home / Self Care | Payer: Medicare Other | Source: Ambulatory Visit | Attending: Internal Medicine | Admitting: Internal Medicine

## 2023-09-01 DIAGNOSIS — Z952 Presence of prosthetic heart valve: Secondary | ICD-10-CM

## 2023-09-01 DIAGNOSIS — Z951 Presence of aortocoronary bypass graft: Secondary | ICD-10-CM | POA: Diagnosis not present

## 2023-09-04 ENCOUNTER — Encounter (HOSPITAL_COMMUNITY)
Admission: RE | Admit: 2023-09-04 | Discharge: 2023-09-04 | Disposition: A | Discharge status: Home / Self Care | Payer: Medicare Other | Source: Ambulatory Visit | Attending: Internal Medicine

## 2023-09-04 DIAGNOSIS — Z951 Presence of aortocoronary bypass graft: Secondary | ICD-10-CM | POA: Diagnosis not present

## 2023-09-06 ENCOUNTER — Encounter (HOSPITAL_COMMUNITY): Admission: RE | Admit: 2023-09-06 | Payer: Medicare Other | Source: Ambulatory Visit

## 2023-09-07 ENCOUNTER — Ambulatory Visit: Admitting: Plastic Surgery

## 2023-09-07 VITALS — BP 116/74 | HR 73

## 2023-09-07 DIAGNOSIS — L89812 Pressure ulcer of head, stage 2: Secondary | ICD-10-CM | POA: Diagnosis not present

## 2023-09-07 NOTE — Progress Notes (Signed)
 Johnathan Anderson returns today for evaluation of the pressure ulcer on the posterior scalp.  He is doing well with no complaints.  The scalp had a small scab over it which was removed and the area cleansed with Vashe.  There is a small amount of granulation tissue which I treated with topical silver nitrate.  Wound continues to heal nicely.  I suspect it will close sometime in the next couple of weeks.  Follow-up with me in 2 to 3 weeks

## 2023-09-08 ENCOUNTER — Encounter (HOSPITAL_COMMUNITY)
Admission: RE | Admit: 2023-09-08 | Discharge: 2023-09-08 | Disposition: A | Discharge status: Home / Self Care | Payer: Medicare Other | Source: Ambulatory Visit | Attending: Internal Medicine | Admitting: Internal Medicine

## 2023-09-08 DIAGNOSIS — Z951 Presence of aortocoronary bypass graft: Secondary | ICD-10-CM | POA: Diagnosis present

## 2023-09-08 DIAGNOSIS — Z952 Presence of prosthetic heart valve: Secondary | ICD-10-CM | POA: Insufficient documentation

## 2023-09-11 ENCOUNTER — Encounter (HOSPITAL_COMMUNITY)
Admission: RE | Admit: 2023-09-11 | Discharge: 2023-09-11 | Disposition: A | Discharge status: Home / Self Care | Payer: Medicare Other | Source: Ambulatory Visit | Attending: Internal Medicine | Admitting: Internal Medicine

## 2023-09-11 DIAGNOSIS — Z951 Presence of aortocoronary bypass graft: Secondary | ICD-10-CM | POA: Diagnosis not present

## 2023-09-11 DIAGNOSIS — Z952 Presence of prosthetic heart valve: Secondary | ICD-10-CM

## 2023-09-13 ENCOUNTER — Encounter (HOSPITAL_COMMUNITY): Admission: RE | Admit: 2023-09-13 | Payer: Medicare Other | Source: Ambulatory Visit

## 2023-09-15 ENCOUNTER — Encounter (HOSPITAL_COMMUNITY)
Admission: RE | Admit: 2023-09-15 | Discharge: 2023-09-15 | Disposition: A | Discharge status: Home / Self Care | Payer: Medicare Other | Source: Ambulatory Visit | Attending: Internal Medicine | Admitting: Internal Medicine

## 2023-09-15 DIAGNOSIS — Z951 Presence of aortocoronary bypass graft: Secondary | ICD-10-CM | POA: Diagnosis not present

## 2023-09-15 DIAGNOSIS — Z952 Presence of prosthetic heart valve: Secondary | ICD-10-CM

## 2023-09-18 ENCOUNTER — Encounter (HOSPITAL_COMMUNITY): Admission: RE | Admit: 2023-09-18 | Payer: Medicare Other | Source: Ambulatory Visit

## 2023-09-20 ENCOUNTER — Encounter (HOSPITAL_COMMUNITY)
Admission: RE | Admit: 2023-09-20 | Discharge: 2023-09-20 | Disposition: A | Discharge status: Home / Self Care | Payer: Medicare Other | Source: Ambulatory Visit | Attending: Internal Medicine

## 2023-09-20 DIAGNOSIS — Z951 Presence of aortocoronary bypass graft: Secondary | ICD-10-CM

## 2023-09-20 DIAGNOSIS — Z952 Presence of prosthetic heart valve: Secondary | ICD-10-CM

## 2023-09-22 ENCOUNTER — Encounter (HOSPITAL_COMMUNITY)
Admission: RE | Admit: 2023-09-22 | Discharge: 2023-09-22 | Disposition: A | Discharge status: Home / Self Care | Payer: Medicare Other | Source: Ambulatory Visit | Attending: Internal Medicine | Admitting: Internal Medicine

## 2023-09-22 DIAGNOSIS — Z951 Presence of aortocoronary bypass graft: Secondary | ICD-10-CM

## 2023-09-22 DIAGNOSIS — Z952 Presence of prosthetic heart valve: Secondary | ICD-10-CM

## 2023-09-25 ENCOUNTER — Encounter (HOSPITAL_COMMUNITY)
Admission: RE | Admit: 2023-09-25 | Discharge: 2023-09-25 | Disposition: A | Discharge status: Home / Self Care | Payer: Medicare Other | Source: Ambulatory Visit | Attending: Internal Medicine | Admitting: Internal Medicine

## 2023-09-25 DIAGNOSIS — Z951 Presence of aortocoronary bypass graft: Secondary | ICD-10-CM | POA: Diagnosis not present

## 2023-09-25 DIAGNOSIS — Z952 Presence of prosthetic heart valve: Secondary | ICD-10-CM

## 2023-09-27 ENCOUNTER — Ambulatory Visit: Admitting: Physician Assistant

## 2023-09-27 ENCOUNTER — Encounter (HOSPITAL_COMMUNITY)
Admission: RE | Admit: 2023-09-27 | Discharge: 2023-09-27 | Disposition: A | Discharge status: Home / Self Care | Payer: Medicare Other | Source: Ambulatory Visit | Attending: Internal Medicine | Admitting: Internal Medicine

## 2023-09-27 ENCOUNTER — Ambulatory Visit: Admitting: Plastic Surgery

## 2023-09-27 VITALS — Ht 67.0 in | Wt 196.4 lb

## 2023-09-27 DIAGNOSIS — Z952 Presence of prosthetic heart valve: Secondary | ICD-10-CM

## 2023-09-27 DIAGNOSIS — Z951 Presence of aortocoronary bypass graft: Secondary | ICD-10-CM | POA: Diagnosis not present

## 2023-09-27 NOTE — Progress Notes (Signed)
 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 Will bring bottle to verify dose, Disp: , Rfl:    famotidine  (PEPCID ) 40 MG tablet, Take 1 tablet (40 mg total) by mouth at bedtime., Disp: 30 tablet, Rfl: 3   fluticasone  (FLONASE ) 50 MCG/ACT nasal spray, Place 2 sprays into both nostrils daily., Disp: 16 g, Rfl: 6   omeprazole  (PRILOSEC) 40 MG capsule, Take 1 capsule (40 mg total) by mouth daily. Take 30 min before breakfast, Disp: 30 capsule, Rfl: 3   potassium chloride  (KLOR-CON ) 20 MEQ packet, Take 20 mEq by mouth daily., Disp: , Rfl:    sodium chloride  (OCEAN) 0.65 % SOLN nasal spray, Place 1 spray into both nostrils as needed., Disp: 30 mL, Rfl: 5   XARELTO 20 MG TABS tablet, Take 20 mg by mouth daily., Disp: , Rfl:   Past Medical History: Past Medical History:  Diagnosis Date   Aortic aneurysm (HCC)    Arthritis    Left Knee   Cancer (HCC) 2022   Skin Cancer   GERD (gastroesophageal reflux disease)    Headache    Migraines   Heart murmur    Hyperlipidemia    Hypertension    Peripheral vascular disease (HCC)    Thoracic Aortic Aneurysm   Vertigo     Tobacco Use: Social History   Tobacco Use   Smoking Status Former   Types: Cigarettes  Smokeless Tobacco Never  Tobacco Comments   Pt quit smoking cigarettes in 2005. 1 pack of cigarettes per week.    Labs: Review Flowsheet  More data exists      Latest Ref Rng & Units 04/27/2023 04/28/2023 04/29/2023 04/30/2023 05/01/2023  Labs for ITP Cardiac and Pulmonary Rehab  PH, Arterial 7.35 - 7.45 7.484  7.486  7.503  7.536  7.547  7.440  7.321  7.368  7.419  7.308  7.298  7.285  7.283  7.347  7.385  7.396  7.402  7.403  7.403  7.458  7.455  7.458  7.482  7.512  7.397  7.336  7.394  7.403  7.387  7.378  7.399  7.429  7.395  7.462  7.472  7.379  7.527  7.476  7.523  7.533  7.574  7.413  7.419  7.439   PCO2 arterial 32 - 48 mmHg 44.0  42.9  38.8  33.3  27.5  25.2  27.1  22.8  30.2  47.0  52.2  55.2  56.4  50.1  46.5  56.2  53.3  51.8  52.5  43.1  44.7  44.4  44.8  40.8  37.6  66.0  58.3  55.8  57.0  58.6  55.4  49.3  50.8  45.6  46.5  57.0  36.7  38.7  35.6  35.7  32.9  51.6  49.8  50.6   Bicarbonate 20.0 - 28.0 mmol/L 33.1  32.5  30.6  28.3  24.0  17.3  14.1  13.2  19.5  23.7  25.7  26.4  26.9  27.7  28.0  34.6  33.3  32.4  32.8  30.6  31.6  31.5  33.6  32.8  23.2  35.3  35.7  34.9  34.3  34.6  34.3  32.8  31.2  32.6  34.0  33.6  30.6  28.7  29.3  30.2  30.5  33.1  32.3  34.3   TCO2 22 - 32 mmol/L 34  34  32  29  25  18  15  14  22  20  25  27  28  29  29  29  36  35  34  34  32  33  33  35  34   24  37  37  37  36  36  36  34  33  34  31  35  35  32  30  30  31   32  35  34  36   Acid-base deficit 0.0 - 2.0 mmol/L 6.0  11.0  11.0  4.0  3.0  1.0  1.0  - 2.0  - -  O2 Saturation % 98  98  99  99  100  100  100  100  100  99  99  94  95  98  100  99  99  98  95  89.9  98  99  99  66.8  100  95  100  95  98  98  97  98  64.9  99  99  99  100  100  68.9  100  100  100  98  100  100  98  99  64  99     Details       Multiple values from one day are sorted in reverse-chronological order         Capillary Blood Glucose: Lab Results  Component  Value Date   GLUCAP 182 (H) 05/01/2023   GLUCAP 132 (H) 05/01/2023   GLUCAP 146 (H) 05/01/2023   GLUCAP 120 (H) 05/01/2023   GLUCAP 143 (H) 05/01/2023     Exercise Target Goals: Exercise Program Goal: Individual exercise prescription set using results from initial 6 min walk test and THRR while considering  patient's activity barriers and safety.   Exercise Prescription Goal: Initial exercise prescription builds to 30-45 minutes a day of aerobic activity, 2-3 days per week.  Home exercise guidelines will be given to patient during program as part of exercise prescription that the participant will acknowledge.  Activity Barriers & Risk Stratification:  Activity Barriers & Cardiac Risk Stratification - 07/11/23 1209       Activity Barriers & Cardiac Risk Stratification   Activity Barriers Balance Concerns;Joint Problems;Deconditioning;Assistive Device;Decreased Ventricular Function;Neck/Spine Problems;Back Problems    Cardiac Risk Stratification High             6 Minute Walk:  6 Minute Walk     Row Name 07/11/23 1308         6 Minute Walk   Phase Initial     Distance 668 feet     Walk Time 6 minutes     # of  Rest Breaks 0     MPH 1.3     METS 2.11     RPE 11     Perceived Dyspnea  0     VO2 Peak 7.4     Symptoms No     Resting HR 83 bpm     Resting BP 104/70     Resting Oxygen Saturation  100 %     Exercise Oxygen Saturation  during 6 min walk 99 %     Max Ex. HR 98 bpm     Max Ex. BP 110/68     2 Minute Post BP 106/64              Oxygen Initial Assessment:   Oxygen Re-Evaluation:   Oxygen Discharge (Final Oxygen Re-Evaluation):   Initial Exercise Prescription:  Initial Exercise Prescription - 07/11/23 1200       Date of Initial Exercise RX and Referring Provider   Date 07/11/23    Expected Discharge Date 10/04/22      Recumbant Bike   Level 1    RPM 60    Watts 20    Minutes 15    METs 2.1      NuStep   Level 1    SPM 75     Minutes 15    METs 2.1      Prescription Details   Frequency (times per week) 3    Duration Progress to 30 minutes of continuous aerobic without signs/symptoms of physical distress      Intensity   THRR 40-80% of Max Heartrate 64-129    Ratings of Perceived Exertion 11-13    Perceived Dyspnea 0-4      Progression   Progression Continue progressive overload as per policy without signs/symptoms or physical distress.      Resistance Training   Training Prescription Yes    Weight 3 lbs    Reps 10-15             Perform Capillary Blood Glucose checks as needed.  Exercise Prescription Changes:   Exercise Prescription Changes     Row Name 07/17/23 1652 08/04/23 1636 08/09/23 1617 08/30/23 1619 09/15/23 1653     Response to Exercise   Blood Pressure (Admit) 96/64 118/68 112/62 104/60 98/60   Blood Pressure (Exercise) 132/64 112/70 110/70 -- --   Blood Pressure (Exit) 104/60 102/64 110/68 102/60 100/64   Heart Rate (Admit) 84 bpm 76 bpm 67 bpm 65 bpm 64 bpm   Heart Rate (Exercise) 96 bpm 111 bpm 117 bpm 106 bpm 107 bpm   Heart Rate (Exit) 1.73 bpm 85 bpm 83 bpm 74 bpm 73 bpm   Rating of Perceived Exertion (Exercise) 10 13 12  11.5 12   Perceived Dyspnea (Exercise) 0 0 0 0 0   Symptoms 0 0 0 0 0   Comments Pt first day in the [employer] program Reviewed MET's, goals and home ExRx Reviewed Home ExRx Reviewed MET's and goals Reviewed MET's   Duration Progress to 30 minutes of  aerobic without signs/symptoms of physical distress Progress to 30 minutes of  aerobic without signs/symptoms of physical distress Progress to 30 minutes of  aerobic without signs/symptoms of physical distress Progress to 30 minutes of  aerobic without signs/symptoms of physical distress Progress to 30 minutes of  aerobic without signs/symptoms of physical distress   Intensity THRR unchanged THRR unchanged THRR unchanged THRR unchanged THRR unchanged     Progression   Progression Continue to progress  workloads  to maintain intensity without signs/symptoms of physical distress. Continue to progress workloads to maintain intensity without signs/symptoms of physical distress. Continue to progress workloads to maintain intensity without signs/symptoms of physical distress. Continue to progress workloads to maintain intensity without signs/symptoms of physical distress. Continue to progress workloads to maintain intensity without signs/symptoms of physical distress.   Average METs 1.65 2.45 2.3 2.85 3.3     Resistance Training   Training Prescription Yes Yes No No Yes   Weight 3 lbs 3 lbs -- -- 5   Reps 10-15 10-15 -- -- 10-15   Time 10 Minutes 10 Minutes -- -- 10 Minutes     Recumbant Bike   Level 1 1 2 4 4    RPM 44 78 70 55 83   Watts 12 20 17  34 141   Minutes 15 15 -- -- --   METs 1.9 2.2 2.1 2.9 3.4     NuStep   Level 1 1 1 4 4    SPM 59 126 114 126 127   Minutes 15 15 15 15 15    METs 1.4 2.7 2.5 2.8 3.2     Home Exercise Plan   Plans to continue exercise at -- -- Home (comment) Home (comment) Home (comment)   Frequency -- -- Add 3 additional days to program exercise sessions. Add 3 additional days to program exercise sessions. Add 3 additional days to program exercise sessions.   Initial Home Exercises Provided -- -- 08/09/23 08/09/23 08/09/23            Exercise Comments:   Exercise Comments     Row Name 07/17/23 1657 08/04/23 1640 08/09/23 1626 08/30/23 1624 09/15/23 1656   Exercise Comments Pt first day in the [facility] program. Pt tolerated exercise well with an average MET level of 1.65. Pt is off to a good start and is learning his THRR, RPE and ExRx Reviewed MET's and goals. Pt tolerated exercise well with an average MET level of 2.45. Pt is feeling good and is feeling an increase in energy at home and is able to do more around the house. He feels good with his progress so far and will work on gradual increases going forward. Reviewed Home ExRx. Pt has bought some  handweights for home use and will start slow. He plans to exercise by walking and hand weights for 30-45 mins per session 2-3 days at home Reviewed MET's and goals. Pt tolerated exercise well with an average MET level of 2.85. Pt is feeling good about his goals and is increasing MET's and WL's. He also wanted to be able to do more around the house and walk more. He is doing much better Reviewed MET's. Pt tolerated exercise well with an average MET level of 3.3. Pt is doing very well and progressing MET's. He is increasing in WL's and also reset Equipment to increase depth of step in nustep to help increase MET's. He's very happy with his progress            Exercise Goals and Review:   Exercise Goals     Row Name 07/11/23 1118             Exercise Goals   Increase Physical Activity Yes       Intervention Provide advice, education, support and counseling about physical activity/exercise needs.;Develop an individualized exercise prescription for aerobic and resistive training based on initial evaluation findings, risk stratification, comorbidities and participant's personal goals.       Expected Outcomes  Short Term: Attend rehab on a regular basis to increase amount of physical activity.;Long Term: Exercising regularly at least 3-5 days a week.;Long Term: Add in home exercise to make exercise part of routine and to increase amount of physical activity.       Increase Strength and Stamina Yes       Intervention Provide advice, education, support and counseling about physical activity/exercise needs.;Develop an individualized exercise prescription for aerobic and resistive training based on initial evaluation findings, risk stratification, comorbidities and participant's personal goals.       Expected Outcomes Short Term: Increase workloads from initial exercise prescription for resistance, speed, and METs.;Short Term: Perform resistance training exercises routinely during rehab and add in  resistance training at home;Long Term: Improve cardiorespiratory fitness, muscular endurance and strength as measured by increased METs and functional capacity ( )       Able to understand and use rate of perceived exertion (RPE) scale Yes       Intervention Provide education and explanation on how to use RPE scale       Expected Outcomes Short Term: Able to use RPE daily in rehab to express subjective intensity level;Long Term:  Able to use RPE to guide intensity level when exercising independently       Knowledge and understanding of Target Heart Rate Range (THRR) Yes       Intervention Provide education and explanation of THRR including how the numbers were predicted and where they are located for reference       Expected Outcomes Short Term: Able to state/look up THRR;Long Term: Able to use THRR to govern intensity when exercising independently;Short Term: Able to use daily as guideline for intensity in rehab       Understanding of Exercise Prescription Yes       Intervention Provide education, explanation, and written materials on patient's individual exercise prescription       Expected Outcomes Short Term: Able to explain program exercise prescription;Long Term: Able to explain home exercise prescription to exercise independently                Exercise Goals Re-Evaluation :  Exercise Goals Re-Evaluation     Row Name 07/17/23 1655 08/04/23 1638 08/30/23 1622         Exercise Goal Re-Evaluation   Exercise Goals Review Increase Physical Activity;Understanding of Exercise Prescription;Increase Strength and Stamina;Knowledge and understanding of Target Heart Rate Range (THRR);Able to understand and use rate of perceived exertion (RPE) scale Increase Physical Activity;Understanding of Exercise Prescription;Increase Strength and Stamina;Knowledge and understanding of Target Heart Rate Range (THRR);Able to understand and use rate of perceived exertion (RPE) scale Increase Physical  Activity;Understanding of Exercise Prescription;Increase Strength and Stamina;Knowledge and understanding of Target Heart Rate Range (THRR);Able to understand and use rate of perceived exertion (RPE) scale     Comments Pt first day in the [facility] program. Pt tolerated exercise well with an average MET level of 1.65. Pt is off to a good start and is learning his THRR, RPE and ExRx Reviewed MET's and goals. Pt tolerated exercise well with an average MET level of 2.45. Pt is feeling good and is feeling an increase in energy at home and is able to do more around the house. He feels good with his progress so far and will work on gradual increases going forward. Reviewed MET's and goals. Pt tolerated exercise well with an average MET level of 2.85. Pt is feeling good about his goals and is increasing MET's and  WL's. He also wanted to be able to do more around the house and walk more. He is doing much better     Expected Outcomes Will continue to monitor pt and progress workloads as tolerated without sign or symptom Will continue to monitor pt and progress workloads as tolerated without sign or symptom Will continue to monitor pt and progress workloads as tolerated without sign or symptom              Discharge Exercise Prescription (Final Exercise Prescription Changes):  Exercise Prescription Changes - 09/15/23 1653       Response to Exercise   Blood Pressure (Admit) 98/60    Blood Pressure (Exit) 100/64    Heart Rate (Admit) 64 bpm    Heart Rate (Exercise) 107 bpm    Heart Rate (Exit) 73 bpm    Rating of Perceived Exertion (Exercise) 12    Perceived Dyspnea (Exercise) 0    Symptoms 0    Comments Reviewed MET's    Duration Progress to 30 minutes of  aerobic without signs/symptoms of physical distress    Intensity THRR unchanged      Progression   Progression Continue to progress workloads to maintain intensity without signs/symptoms of physical distress.    Average METs 3.3       Resistance Training   Training Prescription Yes    Weight 5    Reps 10-15    Time 10 Minutes      Recumbant Bike   Level 4    RPM 83    Watts 141    METs 3.4      NuStep   Level 4    SPM 127    Minutes 15    METs 3.2      Home Exercise Plan   Plans to continue exercise at Home (comment)    Frequency Add 3 additional days to program exercise sessions.    Initial Home Exercises Provided 08/09/23             Nutrition:  Target Goals: Understanding of nutrition guidelines, daily intake of sodium 1500mg , cholesterol 200mg , calories 30% from fat and 7% or less from saturated fats, daily to have 5 or more servings of fruits and vegetables.  Biometrics:  Pre Biometrics - 07/11/23 1130       Pre Biometrics   Waist Circumference 42.5 inches    Hip Circumference 41.5 inches    Waist to Hip Ratio 1.02 %    Triceps Skinfold 20 mm    % Body Fat 30.4 %    Grip Strength 27 kg    Flexibility 0 in   could not reach   Single Leg Stand 4.25 seconds              Nutrition Therapy Plan and Nutrition Goals:  Nutrition Therapy & Goals - 09/15/23 1559       Nutrition Therapy   Diet Heart Healthy Diet    Drug/Food Interactions Statins/Certain Fruits      Personal Nutrition Goals   Nutrition Goal Patient to identify strategies for reducing cardiovascular risk by attending the [facility] education and nutrition series weekly.   goal in progress.   Personal Goal #2 Patient to improve diet quality by using the plate method as a guide for meal planning to include lean protein/plant protein, fruits, vegetables, whole grains, nonfat dairy as part of a well-balanced diet.   goal in progress.   Comments Goals in progress. Johnathan Anderson has medical history of CABG x2,  aortic valve repalcement, HTN, Hyperlipidemia. He continues to attend the [facility] education and nutrition series and reports making heart healthy dietary changes. LDL is at goal. Triglycerides remain elevated.  He is up 3.3#  since starting with our program. Answered patient questions regarding maintaining lean muscle mass/rebuilding lean mass lost during hospitalization. He reports normal appetite at this time. Patient will continue to benefit from participation in intensive cardiac rehab for nutrition, exercise, and lifestyle modification.      Intervention Plan   Intervention Prescribe, educate and counsel regarding individualized specific dietary modifications aiming towards targeted core components such as weight, hypertension, lipid management, diabetes, heart failure and other comorbidities.;Nutrition handout(s) given to patient.    Expected Outcomes Short Term Goal: Understand basic principles of dietary content, such as calories, fat, sodium, cholesterol and nutrients.;Long Term Goal: Adherence to prescribed nutrition plan.             Nutrition Assessments:  Nutrition Assessments - 08/10/23 1243       Rate Your Plate Scores   Pre Score 62            MEDIFICTS Score Key: >=70 Need to make dietary changes  40-70 Heart Healthy Diet <= 40 Therapeutic Level Cholesterol Diet   Flowsheet Row INTENSIVE CARDIAC REHAB from 08[facility]09/2023 in [facility]  Picture Your Plate Total Score on Admission 62      Picture Your Plate Scores: <46 Unhealthy dietary pattern with much room for improvement. 41-50 Dietary pattern unlikely to meet recommendations for good health and room for improvement. 51-60 More healthful dietary pattern, with some room for improvement.  >60 Healthy dietary pattern, although there may be some specific behaviors that could be improved.    Nutrition Goals Re-Evaluation:  Nutrition Goals Re-Evaluation     Row Name 08/11/23 1129 09/15/23 1559           Goals   Current Weight 193 lb 9 oz (87.8 kg) 191 lb 12.8 oz (87 kg)      Comment triglycerides 388, LDL 25 no new labs at this time; most recent labs  triglycerides 388,  LDL 25      Expected Outcome Goals in progress. Johnathan Anderson has medical history of CABG x2, aortic valve repalcement, HTN, Hyperlipidemia. He continues to attend the [facility] education and nutrition series and reports making heart healthy dietary changes. LDL is at goal. Triglycerides remain elevated. Patient will continue to benefit from participation in intensive cardiac rehab for nutrition, exercise, and lifestyle modification. Goals in progress. Johnathan Anderson has medical history of CABG x2, aortic valve repalcement, HTN, Hyperlipidemia. He continues to attend the [facility] education and nutrition series and reports making heart healthy dietary changes. LDL is at goal. Triglycerides remain elevated. He is up 3.3# since starting with our program. Answered patient questions regarding maintaining lean muscle mass/rebuilding lean mass lost during hospitalization. He reports normal appetite at this time. Patient will continue to benefit from participation in intensive cardiac rehab for nutrition, exercise, and lifestyle modification.               Nutrition Goals Re-Evaluation:  Nutrition Goals Re-Evaluation     Row Name 08/11/23 1129 09/15/23 1559           Goals   Current Weight 193 lb 9 oz (87.8 kg) 191 lb 12.8 oz (87 kg)      Comment triglycerides 388, LDL 25 no new labs at this time; most recent labs  triglycerides 388,  LDL 25      Expected Outcome Goals in progress. Johnathan Anderson has medical history of CABG x2, aortic valve repalcement, HTN, Hyperlipidemia. He continues to attend the [facility] education and nutrition series and reports making heart healthy dietary changes. LDL is at goal. Triglycerides remain elevated. Patient will continue to benefit from participation in intensive cardiac rehab for nutrition, exercise, and lifestyle modification. Goals in progress. Johnathan Anderson has medical history of CABG x2, aortic valve repalcement, HTN, Hyperlipidemia. He continues to attend the [facility] education and  nutrition series and reports making heart healthy dietary changes. LDL is at goal. Triglycerides remain elevated. He is up 3.3# since starting with our program. Answered patient questions regarding maintaining lean muscle mass/rebuilding lean mass lost during hospitalization. He reports normal appetite at this time. Patient will continue to benefit from participation in intensive cardiac rehab for nutrition, exercise, and lifestyle modification.               Nutrition Goals Discharge (Final Nutrition Goals Re-Evaluation):  Nutrition Goals Re-Evaluation - 09/15/23 1559       Goals   Current Weight 191 lb 12.8 oz (87 kg)    Comment no new labs at this time; most recent labs  triglycerides 388, LDL 25    Expected Outcome Goals in progress. Johnathan Anderson has medical history of CABG x2, aortic valve repalcement, HTN, Hyperlipidemia. He continues to attend the [facility] education and nutrition series and reports making heart healthy dietary changes. LDL is at goal. Triglycerides remain elevated. He is up 3.3# since starting with our program. Answered patient questions regarding maintaining lean muscle mass/rebuilding lean mass lost during hospitalization. He reports normal appetite at this time. Patient will continue to benefit from participation in intensive cardiac rehab for nutrition, exercise, and lifestyle modification.             Psychosocial: Target Goals: Acknowledge presence or absence of significant depression and/or stress, maximize coping skills, provide positive support system. Participant is able to verbalize types and ability to use techniques and skills needed for reducing stress and depression.  Initial Review & Psychosocial Screening:  Initial Psych Review & Screening - 07/11/23 1443       Initial Review   Current issues with Current Sleep Concerns   sleeping issues due to uncomfortable head wound     Family Dynamics   Good Support System? Yes   Pt has wife Mirta for support      Screening Interventions   Interventions Encouraged to exercise    Expected Outcomes Short Term goal: Identification and review with participant of any Quality of Life or Depression concerns found by scoring the questionnaire.;Long Term goal: The participant improves quality of Life and PHQ9 Scores as seen by post scores and/or verbalization of changes             Quality of Life Scores:  Quality of Life - 07/11/23 1444       Quality of Life   Select Quality of Life      Quality of Life Scores   Health/Function Pre 24.8 %    Socioeconomic Pre 23.31 %    Psych/Spiritual Pre 27.71 %    Family Pre 27.6 %    GLOBAL Pre 25.44 %            Scores of 19 and below usually indicate a poorer quality of life in these areas.  A difference of  2-3 points is a clinically meaningful difference.  A difference of 2-3 points in the  total score of the Quality of Life Index has been associated with significant improvement in overall quality of life, self-image, physical symptoms, and general health in studies assessing change in quality of life.  PHQ-9: Review Flowsheet       07/11/2023 07/06/2022  Depression screen PHQ 2/9  Decreased Interest 1 0  Down, Depressed, Hopeless 0 0  PHQ - 2 Score 1 0  Altered sleeping 3 -  Tired, decreased energy 1 -  Change in appetite 0 -  Feeling bad or failure about yourself  0 -  Trouble concentrating 1 -  Moving slowly or fidgety/restless 2 -  Suicidal thoughts 0 -  PHQ-9 Score 8 -  Difficult doing work/chores Somewhat difficult -   Interpretation of Total Score  Total Score Depression Severity:  1-4 = Minimal depression, 5-9 = Mild depression, 10-14 = Moderate depression, 15-19 = Moderately severe depression, 20-27 = Severe depression   Psychosocial Evaluation and Intervention:   Psychosocial Re-Evaluation:  Psychosocial Re-Evaluation     Row Name 07/18/23 1302 08/08/23 1519 08/31/23 1539 09/27/23 1241       Psychosocial Re-Evaluation    Current issues with Current Sleep Concerns Current Sleep Concerns Current Sleep Concerns;None Identified None Identified    Comments Johnathan Anderson started cardiac rehab on 07/17/23. Tavi denies being depressed and is glad to be able to attend cardiac rehab. 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 Hopefully the pressure wound will continue to heal and Johnathan Anderson will be able to sleep better Johnathan Anderson has not voiced any increased concerns or stressors during exercise at cardiac rehab. 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 Johnathan Anderson has not voiced any increased concerns or stressors during exercise at cardiac rehab. Johnathan Anderson has not voiced any increased concerns or stressors during exercise at cardiac rehab. Johnathan Anderson will complete cardiac rehab at the end of April.    Interventions Encouraged to attend Cardiac Rehabilitation for the exercise;Relaxation education;Stress management education Encouraged to attend Cardiac Rehabilitation for the exercise;Relaxation education;Stress management education Encouraged to attend Cardiac Rehabilitation for the exercise;Relaxation education;Stress management education Encouraged to attend Cardiac Rehabilitation for the exercise;Relaxation education;Stress management education    Continue Psychosocial Services  Follow up required by staff Follow up required by staff Follow up required by staff Follow up required by staff             Psychosocial Discharge (Final Psychosocial Re-Evaluation):  Psychosocial Re-Evaluation - 09/27/23 1241       Psychosocial Re-Evaluation   Current issues with None Identified    Comments Johnathan Anderson has not voiced any increased concerns or stressors during exercise at cardiac rehab. Johnathan Anderson will complete cardiac rehab at the end of April.    Interventions Encouraged to attend Cardiac Rehabilitation for the exercise;Relaxation  education;Stress management education    Continue Psychosocial Services  Follow up required by staff             Vocational Rehabilitation: Provide vocational rehab assistance to qualifying candidates.   Vocational Rehab Evaluation & Intervention:  Vocational Rehab - 07/11/23 1119       Initial Vocational Rehab Evaluation & Intervention   Assessment shows need for Vocational Rehabilitation No   Pt is on disability            Education: Education Goals: Education classes will be provided on a weekly basis, covering required topics. Participant will state understanding/return demonstration of  topics presented.    Education     Row Name 07/17/23 1400     Education   Cardiac Education Topics Pritikin   Select Workshops     Workshops   Educator Exercise Physiologist   Select Exercise   Exercise Workshop Location manager and Fall Prevention   Instruction Review Code 1- Verbalizes Understanding   Class Start Time 1400   Class Stop Time 1445   Class Time Calculation (min) 45 min    Row Name 07/21/23 1500     Education   Cardiac Education Topics Pritikin   Licensed conveyancer Nutrition   Nutrition Overview of the Pritikin Eating Plan   Instruction Review Code 1- Verbalizes Understanding   Class Start Time 1357   Class Stop Time 1444   Class Time Calculation (min) 47 min    Row Name 07/28/23 1500     Education   Cardiac Education Topics Pritikin   Nurse, children's   Educator Dietitian   Select Nutrition   Nutrition Other  Label Reading   Instruction Review Code 1- Verbalizes Understanding   Class Start Time 1358   Class Stop Time 1445   Class Time Calculation (min) 47 min    Row Name 07/31/23 1400     Education   Cardiac Education Topics Pritikin   Select Core Videos     Core Videos   Educator Nurse   Select Nutrition   Nutrition Becoming a Pritikin Chef   Instruction Review Code  1- Verbalizes Understanding   Class Start Time 1401   Class Stop Time 1436   Class Time Calculation (min) 35 min    Row Name 08/04/23 1500     Education   Cardiac Education Topics Pritikin   Glass blower/designer Nutrition   Nutrition Workshop Fueling a Forensic psychologist   Instruction Review Code 1- Tax inspector   Class Start Time 1400   Class Stop Time 1445   Class Time Calculation (min) 45 min    Row Name 08/09/23 1500     Education   Cardiac Education Topics Pritikin   Orthoptist   Educator Dietitian   Weekly Topic Delicious Desserts   Instruction Review Code 1- Verbalizes Understanding   Class Start Time 1355   Class Stop Time 1433   Class Time Calculation (min) 38 min    Row Name 08/11/23 1500     Education   Cardiac Education Topics Pritikin   Nurse, children's   Educator Dietitian   Select Nutrition   Nutrition Calorie Density   Instruction Review Code 1- Verbalizes Understanding   Class Start Time 1400   Class Stop Time 1435   Class Time Calculation (min) 35 min    Row Name 08/14/23 1500     Education   Cardiac Education Topics Pritikin   Banker Exercise Education   Exercise Education Exercise Action Plan   Instruction Review Code 1- Verbalizes Understanding   Class Start Time 1405   Class Stop Time 1455   Class Time Calculation (min) 50 min    Row Name 08/18/23 1400     Education   Cardiac Education Topics Pritikin  Select Core Videos     Core Videos   Educator Exercise Physiologist   Select Exercise Education   Exercise Education Move It!   Instruction Review Code 1- Verbalizes Understanding   Class Start Time 1400   Class Stop Time 1444   Class Time Calculation (min) 44 min    Row Name 08/21/23 1500     Education   Cardiac Education Topics Pritikin   Runner, broadcasting/film/video Nutrition   Nutrition Workshop Targeting Your Nutrition Priorities   Instruction Review Code 1- Verbalizes Understanding   Class Start Time 1400   Class Stop Time 1440   Class Time Calculation (min) 40 min    Row Name 08/23/23 1500     Education   Cardiac Education Topics Pritikin   Customer service manager   Weekly Topic One-Pot Wonders   Instruction Review Code 1- Verbalizes Understanding   Class Start Time 1400   Class Stop Time 1445   Class Time Calculation (min) 45 min    Row Name 08/28/23 1600     Education   Cardiac Education Topics Pritikin   Geographical information systems officer Psychosocial   Psychosocial Workshop Focused Goals, Sustainable Changes   Instruction Review Code 1- Verbalizes Understanding   Class Start Time 1345   Class Stop Time 1443   Class Time Calculation (min) 58 min    Row Name 08/30/23 1600     Education   Cardiac Education Topics Pritikin   Customer service manager   Weekly Topic Comforting Weekend Breakfasts   Instruction Review Code 1- Verbalizes Understanding   Class Start Time 1400   Class Stop Time 1440   Class Time Calculation (min) 40 min    Row Name 09/01/23 1500     Education   Cardiac Education Topics Pritikin   Licensed conveyancer Nutrition   Nutrition Dining Out - Part 1   Instruction Review Code 1- Verbalizes Understanding   Class Start Time 1400   Class Stop Time 1440   Class Time Calculation (min) 40 min    Row Name 09/04/23 1400     Education   Cardiac Education Topics Pritikin   Psychologist, forensic Exercise Education   Exercise Education Biomechanial Limitations   Instruction Review Code 1- Verbalizes Understanding   Class Start  Time 1400   Class Stop Time 1440   Class Time Calculation (min) 40 min    Row Name 09/08/23 1400     Education   Cardiac Education Topics Pritikin   Nurse, children's   Educator Dietitian   Nutrition Vitamins and Minerals   Instruction Review Code 1- Verbalizes Understanding   Class Start Time 1400   Class Stop Time 1440   Class Time Calculation (min) 40 min    Row Name 09/11/23 1500     Education   Cardiac Education Topics Pritikin   Glass blower/designer Nutrition   Nutrition Workshop Fueling a Forensic psychologist   Instruction Review Code 1- TEFL teacher Understanding   Class  Start Time 1400   Class Stop Time 1440   Class Time Calculation (min) 40 min    Row Name 09/15/23 1400     Education   Cardiac Education Topics Pritikin   Psychologist, forensic Exercise Education   Exercise Education Improving Performance   Instruction Review Code 1- Verbalizes Understanding   Class Start Time 1400   Class Stop Time 1435   Class Time Calculation (min) 35 min    Row Name 09/20/23 1500     Education   Cardiac Education Topics Pritikin   Customer service manager   Weekly Topic Simple Sides and Sauces   Instruction Review Code 1- Verbalizes Understanding   Class Start Time 1355   Class Stop Time 1431   Class Time Calculation (min) 36 min    Row Name 09/22/23 1500     Education   Cardiac Education Topics Pritikin   Nurse, children's Exercise Physiologist   Select Psychosocial   Psychosocial How Our Thoughts Can Heal Our Hearts   Instruction Review Code 1- Verbalizes Understanding   Class Start Time 1400   Class Stop Time 1440   Class Time Calculation (min) 40 min    Row Name 09/25/23 1500     Education   Cardiac Education Topics Pritikin   Select Workshops     Workshops   Educator  Exercise Physiologist   Select Exercise   Exercise Workshop Managing Heart Disease: Your Path to a Healthier Heart   Instruction Review Code 1- Verbalizes Understanding   Class Start Time 1400   Class Stop Time 1448   Class Time Calculation (min) 48 min            Core Videos: Exercise    Move It!  Clinical staff conducted group or individual video education with verbal and written material and guidebook.  Patient learns the recommended Pritikin exercise program. Exercise with the goal of living a long, healthy life. Some of the health benefits of exercise include controlled diabetes, healthier blood pressure levels, improved cholesterol levels, improved heart and lung capacity, improved sleep, and better body composition. Everyone should speak with their doctor before starting or changing an exercise routine.  Biomechanical Limitations Clinical staff conducted group or individual video education with verbal and written material and guidebook.  Patient learns how biomechanical limitations can impact exercise and how we can mitigate and possibly overcome limitations to have an impactful and balanced exercise routine.  Body Composition Clinical staff conducted group or individual video education with verbal and written material and guidebook.  Patient learns that body composition (ratio of muscle mass to fat mass) is a key component to assessing overall fitness, rather than body weight alone. Increased fat mass, especially visceral belly fat, can put us  at increased risk for metabolic syndrome, type 2 diabetes, heart disease, and even death. It is recommended to combine diet and exercise (cardiovascular and resistance training) to improve your body composition. Seek guidance from your physician and exercise physiologist before implementing an exercise routine.  Exercise Action Plan Clinical staff conducted group or individual video education with verbal and written material and guidebook.   Patient learns the recommended strategies to achieve and enjoy long-term exercise adherence, including variety, self-motivation, self-efficacy, and positive decision making. Benefits of exercise include fitness, good health, weight management, more  energy, better sleep, less stress, and overall well-being.  Medical   Heart Disease Risk Reduction Clinical staff conducted group or individual video education with verbal and written material and guidebook.  Patient learns our heart is our most vital organ as it circulates oxygen, nutrients, white blood cells, and hormones throughout the entire body, and carries waste away. Data supports a plant-based eating plan like the Pritikin Program for its effectiveness in slowing progression of and reversing heart disease. The video provides a number of recommendations to address heart disease.   Metabolic Syndrome and Belly Fat  Clinical staff conducted group or individual video education with verbal and written material and guidebook.  Patient learns what metabolic syndrome is, how it leads to heart disease, and how one can reverse it and keep it from coming back. You have metabolic syndrome if you have 3 of the following 5 criteria: abdominal obesity, high blood pressure, high triglycerides, low HDL cholesterol, and high blood sugar.  Hypertension and Heart Disease Clinical staff conducted group or individual video education with verbal and written material and guidebook.  Patient learns that high blood pressure, or hypertension, is very common in the [place] . Hypertension is largely due to excessive salt intake, but other important risk factors include being overweight, physical inactivity, drinking too much alcohol, smoking, and not eating enough potassium from fruits and vegetables. High blood pressure is a leading risk factor for heart attack, stroke, congestive heart failure, dementia, kidney failure, and premature death. Long-term effects of  excessive salt intake include stiffening of the arteries and thickening of heart muscle and organ damage. Recommendations include ways to reduce hypertension and the risk of heart disease.  Diseases of Our Time - Focusing on Diabetes Clinical staff conducted group or individual video education with verbal and written material and guidebook.  Patient learns why the best way to stop diseases of our time is prevention, through food and other lifestyle changes. Medicine (such as prescription pills and surgeries) is often only a Band-Aid on the problem, not a long-term solution. Most common diseases of our time include obesity, type 2 diabetes, hypertension, heart disease, and cancer. The Pritikin Program is recommended and has been proven to help reduce, reverse, and/or prevent the damaging effects of metabolic syndrome.  Nutrition   Overview of the Pritikin Eating Plan  Clinical staff conducted group or individual video education with verbal and written material and guidebook.  Patient learns about the Pritikin Eating Plan for disease risk reduction. The Pritikin Eating Plan emphasizes a wide variety of unrefined, minimally-processed carbohydrates, like fruits, vegetables, whole grains, and legumes. Go, Caution, and Stop food choices are explained. Plant-based and lean animal proteins are emphasized. Rationale provided for low sodium intake for blood pressure control, low added sugars for blood sugar stabilization, and low added fats and oils for coronary artery disease risk reduction and weight management.  Calorie Density  Clinical staff conducted group or individual video education with verbal and written material and guidebook.  Patient learns about calorie density and how it impacts the Pritikin Eating Plan. Knowing the characteristics of the food you choose will help you decide whether those foods will lead to weight gain or weight loss, and whether you want to consume more or less of them. Weight  loss is usually a side effect of the Pritikin Eating Plan because of its focus on low calorie-dense foods.  Label Reading  Clinical staff conducted group or individual video education with verbal and written material and guidebook.  Patient learns about the Pritikin recommended label reading guidelines and corresponding recommendations regarding calorie density, added sugars, sodium content, and whole grains.  Dining Out - Part 1  Clinical staff conducted group or individual video education with verbal and written material and guidebook.  Patient learns that restaurant meals can be sabotaging because they can be so high in calories, fat, sodium, and/or sugar. Patient learns recommended strategies on how to positively address this and avoid unhealthy pitfalls.  Facts on Fats  Clinical staff conducted group or individual video education with verbal and written material and guidebook.  Patient learns that lifestyle modifications can be just as effective, if not more so, as many medications for lowering your risk of heart disease. A Pritikin lifestyle can help to reduce your risk of inflammation and atherosclerosis (cholesterol build-up, or plaque, in the artery walls). Lifestyle interventions such as dietary choices and physical activity address the cause of atherosclerosis. A review of the types of fats and their impact on blood cholesterol levels, along with dietary recommendations to reduce fat intake is also included.  Nutrition Action Plan  Clinical staff conducted group or individual video education with verbal and written material and guidebook.  Patient learns how to incorporate Pritikin recommendations into their lifestyle. Recommendations include planning and keeping personal health goals in mind as an important part of their success.  Healthy Mind-Set    Healthy Minds, Bodies, Hearts  Clinical staff conducted group or individual video education with verbal and written material and  guidebook.  Patient learns how to identify when they are stressed. Video will discuss the impact of that stress, as well as the many benefits of stress management. Patient will also be introduced to stress management techniques. The way we think, act, and feel has an impact on our hearts.  How Our Thoughts Can Heal Our Hearts  Clinical staff conducted group or individual video education with verbal and written material and guidebook.  Patient learns that negative thoughts can cause depression and anxiety. This can result in negative lifestyle behavior and serious health problems. Cognitive behavioral therapy is an effective method to help control our thoughts in order to change and improve our emotional outlook.  Additional Videos:  Exercise    Improving Performance  Clinical staff conducted group or individual video education with verbal and written material and guidebook.  Patient learns to use a non-linear approach by alternating intensity levels and lengths of time spent exercising to help burn more calories and lose more body fat. Cardiovascular exercise helps improve heart health, metabolism, hormonal balance, blood sugar control, and recovery from fatigue. Resistance training improves strength, endurance, balance, coordination, reaction time, metabolism, and muscle mass. Flexibility exercise improves circulation, posture, and balance. Seek guidance from your physician and exercise physiologist before implementing an exercise routine and learn your capabilities and proper form for all exercise.  Introduction to Yoga  Clinical staff conducted group or individual video education with verbal and written material and guidebook.  Patient learns about yoga, a discipline of the coming together of mind, breath, and body. The benefits of yoga include improved flexibility, improved range of motion, better posture and core strength, increased lung function, weight loss, and positive self-image. Yoga's  heart health benefits include lowered blood pressure, healthier heart rate, decreased cholesterol and triglyceride levels, improved immune function, and reduced stress. Seek guidance from your physician and exercise physiologist before implementing an exercise routine and learn your capabilities and proper form for all exercise.  Medical   Aging: Enhancing  Your Quality of Life  Clinical staff conducted group or individual video education with verbal and written material and guidebook.  Patient learns key strategies and recommendations to stay in good physical health and enhance quality of life, such as prevention strategies, having an advocate, securing a Health Care Proxy and Power of Attorney, and keeping a list of medications and system for tracking them. It also discusses how to avoid risk for bone loss.  Biology of Weight Control  Clinical staff conducted group or individual video education with verbal and written material and guidebook.  Patient learns that weight gain occurs because we consume more calories than we burn (eating more, moving less). Even if your body weight is normal, you may have higher ratios of fat compared to muscle mass. Too much body fat puts you at increased risk for cardiovascular disease, heart attack, stroke, type 2 diabetes, and obesity-related cancers. In addition to exercise, following the Pritikin Eating Plan can help reduce your risk.  Decoding Lab Results  Clinical staff conducted group or individual video education with verbal and written material and guidebook.  Patient learns that lab test reflects one measurement whose values change over time and are influenced by many factors, including medication, stress, sleep, exercise, food, hydration, pre-existing medical conditions, and more. It is recommended to use the knowledge from this video to become more involved with your lab results and evaluate your numbers to speak with your doctor.   Diseases of Our Time -  Overview  Clinical staff conducted group or individual video education with verbal and written material and guidebook.  Patient learns that according to the CDC, 50% to 70% of chronic diseases (such as obesity, type 2 diabetes, elevated lipids, hypertension, and heart disease) are avoidable through lifestyle improvements including healthier food choices, listening to satiety cues, and increased physical activity.  Sleep Disorders Clinical staff conducted group or individual video education with verbal and written material and guidebook.  Patient learns how good quality and duration of sleep are important to overall health and well-being. Patient also learns about sleep disorders and how they impact health along with recommendations to address them, including discussing with a physician.  Nutrition  Dining Out - Part 2 Clinical staff conducted group or individual video education with verbal and written material and guidebook.  Patient learns how to plan ahead and communicate in order to maximize their dining experience in a healthy and nutritious manner. Included are recommended food choices based on the type of restaurant the patient is visiting.   Fueling a Banker conducted group or individual video education with verbal and written material and guidebook.  There is a strong connection between our food choices and our health. Diseases like obesity and type 2 diabetes are very prevalent and are in large-part due to lifestyle choices. The Pritikin Eating Plan provides plenty of food and hunger-curbing satisfaction. It is easy to follow, affordable, and helps reduce health risks.  Menu Workshop  Clinical staff conducted group or individual video education with verbal and written material and guidebook.  Patient learns that restaurant meals can sabotage health goals because they are often packed with calories, fat, sodium, and sugar. Recommendations include strategies to plan  ahead and to communicate with the manager, chef, or server to help order a healthier meal.  Planning Your Eating Strategy  Clinical staff conducted group or individual video education with verbal and written material and guidebook.  Patient learns about the Pritikin Eating Plan and its  benefit of reducing the risk of disease. The Pritikin Eating Plan does not focus on calories. Instead, it emphasizes high-quality, nutrient-rich foods. By knowing the characteristics of the foods, we choose, we can determine their calorie density and make informed decisions.  Targeting Your Nutrition Priorities  Clinical staff conducted group or individual video education with verbal and written material and guidebook.  Patient learns that lifestyle habits have a tremendous impact on disease risk and progression. This video provides eating and physical activity recommendations based on your personal health goals, such as reducing LDL cholesterol, losing weight, preventing or controlling type 2 diabetes, and reducing high blood pressure.  Vitamins and Minerals  Clinical staff conducted group or individual video education with verbal and written material and guidebook.  Patient learns different ways to obtain key vitamins and minerals, including through a recommended healthy diet. It is important to discuss all supplements you take with your doctor.   Healthy Mind-Set    Smoking Cessation  Clinical staff conducted group or individual video education with verbal and written material and guidebook.  Patient learns that cigarette smoking and tobacco addiction pose a serious health risk which affects millions of people. Stopping smoking will significantly reduce the risk of heart disease, lung disease, and many forms of cancer. Recommended strategies for quitting are covered, including working with your doctor to develop a successful plan.  Culinary   Becoming a Set designer conducted group or  individual video education with verbal and written material and guidebook.  Patient learns that cooking at home can be healthy, cost-effective, quick, and puts them in control. Keys to cooking healthy recipes will include looking at your recipe, assessing your equipment needs, planning ahead, making it simple, choosing cost-effective seasonal ingredients, and limiting the use of added fats, salts, and sugars.  Cooking - Breakfast and Snacks  Clinical staff conducted group or individual video education with verbal and written material and guidebook.  Patient learns how important breakfast is to satiety and nutrition through the entire day. Recommendations include key foods to eat during breakfast to help stabilize blood sugar levels and to prevent overeating at meals later in the day. Planning ahead is also a key component.  Cooking - Educational psychologist conducted group or individual video education with verbal and written material and guidebook.  Patient learns eating strategies to improve overall health, including an approach to cook more at home. Recommendations include thinking of animal protein as a side on your plate rather than center stage and focusing instead on lower calorie dense options like vegetables, fruits, whole grains, and plant-based proteins, such as beans. Making sauces in large quantities to freeze for later and leaving the skin on your vegetables are also recommended to maximize your experience.  Cooking - Healthy Salads and Dressing Clinical staff conducted group or individual video education with verbal and written material and guidebook.  Patient learns that vegetables, fruits, whole grains, and legumes are the foundations of the Pritikin Eating Plan. Recommendations include how to incorporate each of these in flavorful and healthy salads, and how to create homemade salad dressings. Proper handling of ingredients is also covered. Cooking - Soups and [employer] - Soups and Desserts Clinical staff conducted group or individual video education with verbal and written material and guidebook.  Patient learns that Pritikin soups and desserts make for easy, nutritious, and delicious snacks and meal components that are low in sodium, fat, sugar, and calorie density, while high  in vitamins, minerals, and filling fiber. Recommendations include simple and healthy ideas for soups and desserts.   Overview     The Pritikin Solution Program Overview Clinical staff conducted group or individual video education with verbal and written material and guidebook.  Patient learns that the results of the Pritikin Program have been documented in more than 100 articles published in peer-reviewed journals, and the benefits include reducing risk factors for (and, in some cases, even reversing) high cholesterol, high blood pressure, type 2 diabetes, obesity, and more! An overview of the three key pillars of the Pritikin Program will be covered: eating well, doing regular exercise, and having a healthy mind-set.  WORKSHOPS  Exercise: Exercise Basics: Building Your Action Plan Clinical staff led group instruction and group discussion with PowerPoint presentation and patient guidebook. To enhance the learning environment the use of posters, models and videos may be added. At the conclusion of this workshop, patients will comprehend the difference between physical activity and exercise, as well as the benefits of incorporating both, into their routine. Patients will understand the FITT (Frequency, Intensity, Time, and Type) principle and how to use it to build an exercise action plan. In addition, safety concerns and other considerations for exercise and cardiac rehab will be addressed by the presenter. The purpose of this lesson is to promote a comprehensive and effective weekly exercise routine in order to improve patients' overall level of fitness.   Managing Heart  Disease: Your Path to a Healthier Heart Clinical staff led group instruction and group discussion with PowerPoint presentation and patient guidebook. To enhance the learning environment the use of posters, models and videos may be added.At the conclusion of this workshop, patients will understand the anatomy and physiology of the heart. Additionally, they will understand how Pritikin's three pillars impact the risk factors, the progression, and the management of heart disease.  The purpose of this lesson is to provide a high-level overview of the heart, heart disease, and how the Pritikin lifestyle positively impacts risk factors.  Exercise Biomechanics Clinical staff led group instruction and group discussion with PowerPoint presentation and patient guidebook. To enhance the learning environment the use of posters, models and videos may be added. Patients will learn how the structural parts of their bodies function and how these functions impact their daily activities, movement, and exercise. Patients will learn how to promote a neutral spine, learn how to manage pain, and identify ways to improve their physical movement in order to promote healthy living. The purpose of this lesson is to expose patients to common physical limitations that impact physical activity. Participants will learn practical ways to adapt and manage aches and pains, and to minimize their effect on regular exercise. Patients will learn how to maintain good posture while sitting, walking, and lifting.  Balance Training and Fall Prevention  Clinical staff led group instruction and group discussion with PowerPoint presentation and patient guidebook. To enhance the learning environment the use of posters, models and videos may be added. At the conclusion of this workshop, patients will understand the importance of their sensorimotor skills (vision, proprioception, and the vestibular system) in maintaining their ability to  balance as they age. Patients will apply a variety of balancing exercises that are appropriate for their current level of function. Patients will understand the common causes for poor balance, possible solutions to these problems, and ways to modify their physical environment in order to minimize their fall risk. The purpose of this lesson is to teach patients about  the importance of maintaining balance as they age and ways to minimize their risk of falling.  WORKSHOPS   Nutrition:  Fueling a Ship broker led group instruction and group discussion with PowerPoint presentation and patient guidebook. To enhance the learning environment the use of posters, models and videos may be added. Patients will review the foundational principles of the Pritikin Eating Plan and understand what constitutes a serving size in each of the food groups. Patients will also learn Pritikin-friendly foods that are better choices when away from home and review make-ahead meal and snack options. Calorie density will be reviewed and applied to three nutrition priorities: weight maintenance, weight loss, and weight gain. The purpose of this lesson is to reinforce (in a group setting) the key concepts around what patients are recommended to eat and how to apply these guidelines when away from home by planning and selecting Pritikin-friendly options. Patients will understand how calorie density may be adjusted for different weight management goals.  Mindful Eating  Clinical staff led group instruction and group discussion with PowerPoint presentation and patient guidebook. To enhance the learning environment the use of posters, models and videos may be added. Patients will briefly review the concepts of the Pritikin Eating Plan and the importance of low-calorie dense foods. The concept of mindful eating will be introduced as well as the importance of paying attention to internal hunger signals. Triggers for non-hunger  eating and techniques for dealing with triggers will be explored. The purpose of this lesson is to provide patients with the opportunity to review the basic principles of the Pritikin Eating Plan, discuss the value of eating mindfully and how to measure internal cues of hunger and fullness using the Hunger Scale. Patients will also discuss reasons for non-hunger eating and learn strategies to use for controlling emotional eating.  Targeting Your Nutrition Priorities Clinical staff led group instruction and group discussion with PowerPoint presentation and patient guidebook. To enhance the learning environment the use of posters, models and videos may be added. Patients will learn how to determine their genetic susceptibility to disease by reviewing their family history. Patients will gain insight into the importance of diet as part of an overall healthy lifestyle in mitigating the impact of genetics and other environmental insults. The purpose of this lesson is to provide patients with the opportunity to assess their personal nutrition priorities by looking at their family history, their own health history and current risk factors. Patients will also be able to discuss ways of prioritizing and modifying the Pritikin Eating Plan for their highest risk areas  Menu  Clinical staff led group instruction and group discussion with PowerPoint presentation and patient guidebook. To enhance the learning environment the use of posters, models and videos may be added. Using menus brought in from [employer], or printed from [employer], patients will apply the Pritikin dining out guidelines that were presented in the [employer] video. Patients will also be able to practice these guidelines in a variety of provided scenarios. The purpose of this lesson is to provide patients with the opportunity to practice hands-on learning of the Pritikin Dining Out guidelines with actual menus and practice  scenarios.  Label Reading Clinical staff led group instruction and group discussion with PowerPoint presentation and patient guidebook. To enhance the learning environment the use of posters, models and videos may be added. Patients will review and discuss the Pritikin label reading guidelines presented in Pritikin's Label Reading Educational series video. Using  fool labels brought in from local grocery stores and markets, patients will apply the label reading guidelines and determine if the packaged food meet the Pritikin guidelines. The purpose of this lesson is to provide patients with the opportunity to review, discuss, and practice hands-on learning of the Pritikin Label Reading guidelines with actual packaged food labels. Cooking School  Pritikin's [employer] are designed to teach patients ways to prepare quick, simple, and affordable recipes at home. The importance of nutrition's role in chronic disease risk reduction is reflected in its emphasis in the overall Pritikin program. By learning how to prepare essential core Pritikin Eating Plan recipes, patients will increase control over what they eat; be able to customize the flavor of foods without the use of added salt, sugar, or fat; and improve the quality of the food they consume. By learning a set of core recipes which are easily assembled, quickly prepared, and affordable, patients are more likely to prepare more healthy foods at home. These workshops focus on convenient breakfasts, simple entres, side dishes, and desserts which can be prepared with minimal effort and are consistent with nutrition recommendations for cardiovascular risk reduction. Cooking [employer] are taught by a Armed forces logistics/support/administrative officer (RD) who has been trained by the [employer]. The chef or RD has a clear understanding of the importance of minimizing - if not completely eliminating - added fat, sugar, and sodium in recipes. Throughout  the series of Cooking School Workshop sessions, patients will learn about healthy ingredients and efficient methods of cooking to build confidence in their capability to prepare    Cooking School weekly topics:  Adding Flavor- Sodium-Free  Fast and Healthy Breakfasts  Powerhouse Plant-Based Proteins  Satisfying Salads and Dressings  Simple Sides and Sauces  International Cuisine-Spotlight on the [employer] Zones  Delicious Desserts  Savory Soups  [employer] - Meals in a Astronomer Appetizers and Snacks  Comforting Weekend Breakfasts  One-Pot Wonders   Fast Evening Meals  Landscape architect Your Pritikin Plate  WORKSHOPS   Healthy Mindset (Psychosocial):  Focused Goals, Sustainable Changes Clinical staff led group instruction and group discussion with PowerPoint presentation and patient guidebook. To enhance the learning environment the use of posters, models and videos may be added. Patients will be able to apply effective goal setting strategies to establish at least one personal goal, and then take consistent, meaningful action toward that goal. They will learn to identify common barriers to achieving personal goals and develop strategies to overcome them. Patients will also gain an understanding of how our mind-set can impact our ability to achieve goals and the importance of cultivating a positive and growth-oriented mind-set. The purpose of this lesson is to provide patients with a deeper understanding of how to set and achieve personal goals, as well as the tools and strategies needed to overcome common obstacles which may arise along the way.  From Head to Heart: The Power of a Healthy Outlook  Clinical staff led group instruction and group discussion with PowerPoint presentation and patient guidebook. To enhance the learning environment the use of posters, models and videos may be added. Patients will be able to recognize and describe the impact of emotions and  mood on physical health. They will discover the importance of self-care and explore self-care practices which may work for them. Patients will also learn how to utilize the 4 C's to cultivate a healthier outlook and better manage stress and challenges. The purpose of  this lesson is to demonstrate to patients how a healthy outlook is an essential part of maintaining good health, especially as they continue their cardiac rehab journey.  Healthy Sleep for a Healthy Heart Clinical staff led group instruction and group discussion with PowerPoint presentation and patient guidebook. To enhance the learning environment the use of posters, models and videos may be added. At the conclusion of this workshop, patients will be able to demonstrate knowledge of the importance of sleep to overall health, well-being, and quality of life. They will understand the symptoms of, and treatments for, common sleep disorders. Patients will also be able to identify daytime and nighttime behaviors which impact sleep, and they will be able to apply these tools to help manage sleep-related challenges. The purpose of this lesson is to provide patients with a general overview of sleep and outline the importance of quality sleep. Patients will learn about a few of the most common sleep disorders. Patients will also be introduced to the concept of "sleep hygiene," and discover ways to self-manage certain sleeping problems through simple daily behavior changes. Finally, the workshop will motivate patients by clarifying the links between quality sleep and their goals of heart-healthy living.   Recognizing and Reducing Stress Clinical staff led group instruction and group discussion with PowerPoint presentation and patient guidebook. To enhance the learning environment the use of posters, models and videos may be added. At the conclusion of this workshop, patients will be able to understand the types of stress reactions, differentiate between  acute and chronic stress, and recognize the impact that chronic stress has on their health. They will also be able to apply different coping mechanisms, such as reframing negative self-talk. Patients will have the opportunity to practice a variety of stress management techniques, such as deep abdominal breathing, progressive muscle relaxation, and/or guided imagery.  The purpose of this lesson is to educate patients on the role of stress in their lives and to provide healthy techniques for coping with it.  Learning Barriers/Preferences:  Learning Barriers/Preferences - 07/11/23 1445       Learning Barriers/Preferences   Learning Barriers Sight   wears reading glasses   Learning Preferences Video;Written Material;Computer/Internet;Pictoral             Education Topics:  Knowledge Questionnaire Score:  Knowledge Questionnaire Score - 07/11/23 1445       Knowledge Questionnaire Score   Pre Score 21/24             Core Components/Risk Factors/Patient Goals at Admission:  Personal Goals and Risk Factors at Admission - 07/11/23 1445       Core Components/Risk Factors/Patient Goals on Admission   Hypertension Yes    Intervention Provide education on lifestyle modifcations including regular physical activity/exercise, weight management, moderate sodium restriction and increased consumption of fresh fruit, vegetables, and low fat dairy, alcohol moderation, and smoking cessation.;Monitor prescription use compliance.    Expected Outcomes Short Term: Continued assessment and intervention until BP is < 140/79mm HG in hypertensive participants. < 130/38mm HG in hypertensive participants with diabetes, heart failure or chronic kidney disease.;Long Term: Maintenance of blood pressure at goal levels.    Lipids Yes    Intervention Provide education and support for participant on nutrition & aerobic/resistive exercise along with prescribed medications to achieve LDL 70mg , HDL >40mg .     Expected Outcomes Short Term: Participant states understanding of desired cholesterol values and is compliant with medications prescribed. Participant is following exercise prescription and nutrition guidelines.;Long Term: Cholesterol controlled  with medications as prescribed, with individualized exercise RX and with personalized nutrition plan. Value goals: LDL < 70mg , HDL > 40 mg.             Core Components/Risk Factors/Patient Goals Review:   Goals and Risk Factor Review     Row Name 07/18/23 1311 08/08/23 1523 08/31/23 1542 09/27/23 1244       Core Components/Risk Factors/Patient Goals Review   Personal Goals Review Weight Management/Obesity;Hypertension;Lipids Weight Management/Obesity;Hypertension;Lipids Weight Management/Obesity;Hypertension;Lipids Weight Management/Obesity;Hypertension;Lipids    Review Johnathan Anderson started cardiac rehab on 07/17/23. Johnathan Anderson did well with exercise. VSS. Johnathan Anderson uses a cane for stabilty. Johnathan Anderson continues to do  well with exercise. VSS. Johnathan Anderson continues to do  well with exercise. VSS. Johnathan Anderson has gained 2.5 kg since starting the program. Johnathan Anderson has increased his workloads. Johnathan Anderson will tenatively complete cardiac rehab on 10/04/23. Johnathan Anderson continues to do  well with exercise. VSS. Johnathan Anderson has gained 3.9 kg since starting the program. Johnathan Anderson has increased his workloads. Johnathan Anderson will tenatively complete cardiac rehab on 10/04/23.    Expected Outcomes [place]e will continue to participate in cardiac rehab for exercise, nutrition and lifestyle modifications [place] will continue to participate in cardiac rehab for exercise, nutrition and lifestyle modifications [place] will continue to participate in cardiac rehab for exercise, nutrition and lifestyle modifications Johnathan Anderson will continue to participate in cardiac rehab for exercise, nutrition and lifestyle modifications             Core Components/Risk Factors/Patient Goals at Discharge (Final Review):    Goals and Risk Factor Review - 09/27/23 1244       Core Components/Risk Factors/Patient Goals Review   Personal Goals Review Weight Management/Obesity;Hypertension;Lipids    Review Ryelan continues to do  well with exercise. VSS. Geovani has gained 3.9 kg since starting the program. Raydell has increased his workloads. Xzavion will tenatively complete cardiac rehab on 10/04/23.    Expected Outcomes Dredyn will continue to participate in cardiac rehab for exercise, nutrition and lifestyle modifications             ITP Comments:  ITP Comments     Row Name 07/11/23 1115 07/18/23 1300 08/08/23 1518 08/31/23 1538 09/27/23 1239   ITP Comments Gaylyn Keas, MD:  Medical Director.  Introduction to the Pritikin Education Program/Intensive cardiac Rehab.  [facility]ila orientation packet reviewed with the patient. 30 Day ITP Review. Azim started cardiac rehab on 02/1//25. Pascal did well with exercise. 30 Day ITP Review. Jayland has good participation with  exercise at cardiac rehab when in attendance. 30 Day ITP Review. Shawne continues to have good participation with  exercise at cardiac rehab when in attendance. 30 Day ITP Review. Bryston has  good participation  and attendance with exercise at cardiac rehab. Lynkin will complete cardiac rehab at the end of the month            Comments: See ITP comments.Monte Shahram RN BSN

## 2023-09-29 ENCOUNTER — Encounter (HOSPITAL_COMMUNITY): Admission: RE | Admit: 2023-09-29 | Payer: Medicare Other | Source: Ambulatory Visit

## 2023-09-30 ENCOUNTER — Emergency Department (HOSPITAL_BASED_OUTPATIENT_CLINIC_OR_DEPARTMENT_OTHER)
Admission: EM | Admit: 2023-09-30 | Discharge: 2023-10-01 | Disposition: A | Discharge status: Home / Self Care | Attending: Emergency Medicine | Admitting: Emergency Medicine

## 2023-09-30 ENCOUNTER — Encounter (HOSPITAL_BASED_OUTPATIENT_CLINIC_OR_DEPARTMENT_OTHER): Payer: Self-pay | Admitting: Emergency Medicine

## 2023-09-30 ENCOUNTER — Emergency Department (HOSPITAL_BASED_OUTPATIENT_CLINIC_OR_DEPARTMENT_OTHER)

## 2023-09-30 ENCOUNTER — Other Ambulatory Visit: Payer: Self-pay

## 2023-09-30 DIAGNOSIS — Z951 Presence of aortocoronary bypass graft: Secondary | ICD-10-CM | POA: Insufficient documentation

## 2023-09-30 DIAGNOSIS — Z7901 Long term (current) use of anticoagulants: Secondary | ICD-10-CM | POA: Insufficient documentation

## 2023-09-30 DIAGNOSIS — J189 Pneumonia, unspecified organism: Secondary | ICD-10-CM

## 2023-09-30 DIAGNOSIS — D649 Anemia, unspecified: Secondary | ICD-10-CM

## 2023-09-30 DIAGNOSIS — R7401 Elevation of levels of liver transaminase levels: Secondary | ICD-10-CM

## 2023-09-30 DIAGNOSIS — R062 Wheezing: Secondary | ICD-10-CM

## 2023-09-30 DIAGNOSIS — D696 Thrombocytopenia, unspecified: Secondary | ICD-10-CM | POA: Diagnosis not present

## 2023-09-30 DIAGNOSIS — Z7982 Long term (current) use of aspirin: Secondary | ICD-10-CM | POA: Diagnosis not present

## 2023-09-30 DIAGNOSIS — R0602 Shortness of breath: Secondary | ICD-10-CM | POA: Diagnosis present

## 2023-09-30 DIAGNOSIS — J810 Acute pulmonary edema: Secondary | ICD-10-CM | POA: Diagnosis not present

## 2023-09-30 DIAGNOSIS — Z79899 Other long term (current) drug therapy: Secondary | ICD-10-CM | POA: Diagnosis not present

## 2023-09-30 DIAGNOSIS — J181 Lobar pneumonia, unspecified organism: Secondary | ICD-10-CM | POA: Diagnosis not present

## 2023-09-30 DIAGNOSIS — I10 Essential (primary) hypertension: Secondary | ICD-10-CM | POA: Diagnosis not present

## 2023-09-30 LAB — CBC WITH DIFFERENTIAL/PLATELET
Abs Immature Granulocytes: 0.03 10*3/uL (ref 0.00–0.07)
Basophils Absolute: 0 10*3/uL (ref 0.0–0.1)
Basophils Relative: 0 %
Eosinophils Absolute: 0.1 10*3/uL (ref 0.0–0.5)
Eosinophils Relative: 1 %
HCT: 38.2 % — ABNORMAL LOW (ref 39.0–52.0)
Hemoglobin: 12.6 g/dL — ABNORMAL LOW (ref 13.0–17.0)
Immature Granulocytes: 0 %
Lymphocytes Relative: 32 %
Lymphs Abs: 2.2 10*3/uL (ref 0.7–4.0)
MCH: 27 pg (ref 26.0–34.0)
MCHC: 33 g/dL (ref 30.0–36.0)
MCV: 81.8 fL (ref 80.0–100.0)
Monocytes Absolute: 0.5 10*3/uL (ref 0.1–1.0)
Monocytes Relative: 7 %
Neutro Abs: 4.1 10*3/uL (ref 1.7–7.7)
Neutrophils Relative %: 60 %
Platelets: 113 10*3/uL — ABNORMAL LOW (ref 150–400)
RBC: 4.67 MIL/uL (ref 4.22–5.81)
RDW: 16.9 % — ABNORMAL HIGH (ref 11.5–15.5)
WBC: 6.9 10*3/uL (ref 4.0–10.5)
nRBC: 0 % (ref 0.0–0.2)

## 2023-09-30 LAB — TROPONIN T, HIGH SENSITIVITY
Troponin T High Sensitivity: 35 ng/L — ABNORMAL HIGH (ref <19)
Troponin T High Sensitivity: 34 ng/L — ABNORMAL HIGH (ref <19)

## 2023-09-30 LAB — COMPREHENSIVE METABOLIC PANEL WITH GFR
ALT: 114 U/L — ABNORMAL HIGH (ref 0–44)
AST: 87 U/L — ABNORMAL HIGH (ref 15–41)
Albumin: 4.3 g/dL (ref 3.5–5.0)
Alkaline Phosphatase: 76 U/L (ref 38–126)
Anion gap: 17 — ABNORMAL HIGH (ref 5–15)
BUN: 20 mg/dL (ref 6–20)
CO2: 20 mmol/L — ABNORMAL LOW (ref 22–32)
Calcium: 9.8 mg/dL (ref 8.9–10.3)
Chloride: 102 mmol/L (ref 98–111)
Creatinine, Ser: 1.19 mg/dL (ref 0.61–1.24)
GFR, Estimated: >60 mL/min (ref >60)
Glucose, Bld: 129 mg/dL — ABNORMAL HIGH (ref 70–99)
Potassium: 4.3 mmol/L (ref 3.5–5.1)
Sodium: 139 mmol/L (ref 135–145)
Total Bilirubin: 0.6 mg/dL (ref 0.0–1.2)
Total Protein: 7.8 g/dL (ref 6.5–8.1)

## 2023-09-30 LAB — D-DIMER, QUANTITATIVE: D-Dimer, Quant: 0.86 ug{FEU}/mL — ABNORMAL HIGH (ref 0.00–0.50)

## 2023-09-30 LAB — MAGNESIUM: Magnesium: 1.9 mg/dL (ref 1.7–2.4)

## 2023-09-30 LAB — PRO BRAIN NATRIURETIC PEPTIDE: Pro Brain Natriuretic Peptide: 1247 pg/mL — ABNORMAL HIGH (ref <300.0)

## 2023-09-30 MED ORDER — IOHEXOL 350 MG/ML SOLN
75.0000 mL | Freq: Once | INTRAVENOUS | Status: AC | PRN
  Administered 2023-09-30: 75 mL via INTRAVENOUS

## 2023-09-30 MED ORDER — IPRATROPIUM-ALBUTEROL 0.5-2.5 (3) MG/3ML IN SOLN
3.0000 mL | Freq: Once | RESPIRATORY_TRACT | Status: AC
  Administered 2023-09-30: 3 mL via RESPIRATORY_TRACT
  Filled 2023-09-30: qty 3

## 2023-09-30 MED ORDER — FUROSEMIDE 10 MG/ML IJ SOLN
20.0000 mg | Freq: Once | INTRAMUSCULAR | Status: AC
Start: 2023-09-30 — End: 2023-09-30
  Administered 2023-09-30: 20 mg via INTRAVENOUS
  Filled 2023-09-30: qty 2

## 2023-09-30 NOTE — ED Provider Notes (Signed)
 Care assumed from Dr. Ranelle Buys, patient with shortness of breath, has been noncompliant with rivaroxiban. He has been treated for heart failure - also was wheezing on presentation and received duoneb. He is pending CTA chest. Troponin was mildly elevated, repeat pending.  Repeat troponin is essentially unchanged.  CT scan shows left lower lobe pneumonia and no other significant findings.  I have independently viewed the images, and agree with the radiologist's interpretation.  I have reevaluated the patient, he is resting comfortably and is nontoxic in appearance.  I have ordered a trial of ambulation to make sure that he does not have oxygen desaturation.  He was able to ambulate without an oxygen desaturation, he continues to feel well.  I have ordered an albuterol inhaler for him to take home and I have ordered a dose of ceftriaxone  for his pneumonia and I am discharging him with a prescription for cefdinir.  CT Angio Chest PE W and/or Wo Contrast Result Date: 10/01/2023 CLINICAL DATA:  Shortness of breath, chest pressure x4 days, evaluate for PE EXAM: CT ANGIOGRAPHY CHEST WITH CONTRAST TECHNIQUE: Multidetector CT imaging of the chest was performed using the standard protocol during bolus administration of intravenous contrast. Multiplanar CT image reconstructions and MIPs were obtained to evaluate the vascular anatomy. RADIATION DOSE REDUCTION: This exam was performed according to the departmental dose-optimization program which includes automated exposure control, adjustment of the mA and/or kV according to patient size and/or use of iterative reconstruction technique. CONTRAST:  75mL OMNIPAQUE  IOHEXOL  350 MG/ML SOLN COMPARISON:  Chest radiograph earlier today. CTA chest dated 06/14/2022 FINDINGS: Cardiovascular: No evidence of pulmonary embolism to the segmental level. Although not tailored for evaluation of the thoracic aorta, there is no evidence of thoracic aortic aneurysm. Prosthetic aortic valve. Mild  cardiomegaly. No pericardial effusion. Leadless pacemaker at the right ventricular apex. Postsurgical changes related to prior CABG. Mediastinum/Nodes: Small mediastinal nodes, including a dominant 13 mm short axis low right paratracheal node (image 44), presumably reactive. Visualized thyroid is unremarkable. Lungs/Pleura: Faint ground-glass opacities in the upper lobes. Mild scarring in the anterior right hemithorax. Multifocal patchy/nodular opacities in the left lower lobe, suggesting multifocal pneumonia. Small right and trace left pleural effusions. No pneumothorax. Upper Abdomen: Visualized upper abdomen is grossly unremarkable. Musculoskeletal: Median sternotomy. Review of the MIP images confirms the above findings. IMPRESSION: No evidence of pulmonary embolism. Left lower lobe pneumonia. Small right and trace left pleural effusions. Additional ancillary findings as above. Electronically Signed   By: Zadie Herter M.D.   On: 10/01/2023 00:05     Alissa April, MD 10/01/23 [id number]

## 2023-09-30 NOTE — ED Provider Notes (Signed)
 [place] [facility] Provider Note   CSN: [id number] Arrival date & time: 09/30/23  2034     History  Chief Complaint  Patient presents with   Shortness of Breath   Chest Pain    Johnathan Anderson is a 58 y.o. male.  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  Increased shortness of breath over the last 3 to 4 days.  Associated chest heaviness.  No fevers chills productive coughing nausea vomiting or GI symptoms   Shortness of Breath Associated symptoms: chest pain   Chest Pain Associated symptoms: shortness of breath        Home Medications Prior to Admission medications   Medication Sig Start Date End Date Taking? Authorizing Provider  aspirin  81 MG chewable tablet Chew 81 mg by mouth daily.    [provider]  atorvastatin (LIPITOR) 40 MG tablet Take 1 tablet by mouth at bedtime. 05/30/23 05/29/24  [provider]  cetirizine  (ZYRTEC ) 10 MG tablet Take 1 tablet (10 mg total) by mouth at bedtime. 07/19/23   Soldatova, Liuba, MD  Cholecalciferol (VITAMIN D3) 75 MCG (3000 UT) TABS Take 75 mcg by mouth daily. Will bring bottle to verify dose    [provider]  famotidine  (PEPCID ) 40 MG tablet Take 1 tablet (40 mg total) by mouth at bedtime. 07/19/23   Soldatova, Liuba, MD  fluticasone  (FLONASE ) 50 MCG/ACT nasal spray Place 2 sprays into both nostrils daily. 07/19/23   Soldatova, Liuba, MD  omeprazole  (PRILOSEC) 40 MG capsule Take 1 capsule (40 mg total) by mouth daily. Take 30 min before breakfast 07/19/23   Soldatova, Liuba, MD  potassium chloride  (KLOR-CON ) 20 MEQ packet Take 20 mEq by mouth daily. 06/14/23 06/13/24  [provider]  sodium chloride  (OCEAN) 0.65 % SOLN nasal spray Place 1 spray into both nostrils as needed. 07/19/23   Soldatova, Liuba, MD  XARELTO 20 MG TABS tablet Take 20 mg by mouth daily.     [provider]      Allergies    Levaquin [levofloxacin]    Review of Systems   Review of Systems  Respiratory:  Positive for shortness of breath.   Cardiovascular:  Positive for chest pain.    Physical Exam Updated Vital Signs BP 111/80   Pulse 76   Temp 99.1 F (37.3 C) (Oral)   Resp 15   Ht 5\' 7"  (1.702 m)   Wt 89.1 kg   SpO2 99%   BMI 30.77 kg/m  Physical Exam Vitals and nursing note reviewed.  HENT:     Head: Normocephalic and atraumatic.  Eyes:     Pupils: Pupils are equal, round, and reactive to light.  Cardiovascular:     Rate and Rhythm: Normal rate and regular rhythm.  Pulmonary:     Effort: Pulmonary effort is normal.     Breath sounds: Examination of the right-upper field reveals wheezing. Examination of the left-upper field reveals wheezing. Wheezing present.  Abdominal:     Palpations: Abdomen is soft.     Tenderness: There is no abdominal tenderness.  Skin:    General: Skin is warm and dry.  Neurological:     Mental Status: He is alert.  Psychiatric:        Mood and Affect: Mood normal.     ED Results / Procedures / Treatments   Labs (all labs ordered are listed, but  only abnormal results are displayed) Labs Reviewed  COMPREHENSIVE METABOLIC PANEL WITH GFR - Abnormal; Notable for the following components:      Result Value   CO2 20 (*)    Glucose, Bld 129 (*)    AST 87 (*)    ALT 114 (*)    Anion gap 17 (*)    All other components within normal limits  CBC WITH DIFFERENTIAL/PLATELET - Abnormal; Notable for the following components:   Hemoglobin 12.6 (*)    HCT 38.2 (*)    RDW 16.9 (*)    Platelets 113 (*)    All other components within normal limits  PRO BRAIN NATRIURETIC PEPTIDE - Abnormal; Notable for the following components:   Pro Brain Natriuretic Peptide 1,247.0 (*)    All other components within normal limits  D-DIMER, QUANTITATIVE - Abnormal; Notable for the following components:   D-Dimer, Quant 0.86 (*)    All  other components within normal limits  TROPONIN T, HIGH SENSITIVITY - Abnormal; Notable for the following components:   Troponin T High Sensitivity 35 (*)    All other components within normal limits  TROPONIN T, HIGH SENSITIVITY - Abnormal; Notable for the following components:   Troponin T High Sensitivity 34 (*)    All other components within normal limits  MAGNESIUM     EKG EKG Interpretation Date/Time:  Saturday September 30 2023 20:50:59 EDT Ventricular Rate:  59 PR Interval:  322 QRS Duration:  123 QT Interval:  459 QTC Calculation: 423 R Axis:   129  Text Interpretation: Sinus rhythm Ventricular tachycardia, unsustained Prolonged PR interval Nonspecific intraventricular conduction delay Borderline ST depression, anterolateral leads Confirmed by Rafael Bun [phone]) on 09/30/2023 11:43:04 PM  Radiology DG Chest Portable 1 View Result Date: 09/30/2023 CLINICAL DATA:  Short of breath worsening for 2 days EXAM: PORTABLE CHEST 1 VIEW COMPARISON:  05/01/2023 FINDINGS: Single frontal view of the chest demonstrates stable postsurgical changes from CABG and aortic valve replacement. Mild enlargement the cardiac silhouette. Mild vascular congestion. No acute airspace disease, effusion, or pneumothorax. No acute bony abnormalities. IMPRESSION: 1. Mild vascular congestion.  No acute airspace disease. Electronically Signed   By: Bobbye Burrow M.D.   On: 09/30/2023 22:37    Procedures Procedures    Medications Ordered in ED Medications  ipratropium-albuterol (DUONEB) 0.5-2.5 (3) MG/3ML nebulizer solution 3 mL (3 mLs Nebulization Given 09/30/23 2122)  furosemide  (LASIX ) injection 20 mg (20 mg Intravenous Given 09/30/23 2317)  iohexol  (OMNIPAQUE ) 350 MG/ML injection 75 mL (75 mLs Intravenous Contrast Given 09/30/23 2327)    ED Course/ Medical Decision Making/ A&P Clinical Course as of 09/30/23 2346  Sat Sep 30, 2023  2244 Laboratory workup notable for elevated BNP and elevated D-dimer.   Troponin of 35 will obtain delta troponin.  No active chest pain at this time.  Chest x-ray shows mild congestion.  Will obtain CTA chest to evaluate for PE given elevated D-dimer.  Patient now mentions that he has been out of Xarelto for the last week putting him at higher risk for PE.  Given elevated BNP and mild pulmonary edema we will give 1 dose of Lasix  here and continue to monitor [MP]  2343 I, Rafael Bun DO, am transitioning care of this patient to the oncoming provider pending CTA chest, reevaluation and disposition [MP]    Clinical Course User Index [MP] Sallyanne Creamer, DO  Medical Decision Making 58 year old male with extensive cardiac history as above presenting for shortness of breath.  Stable on room air with wheezing on exam.  Afebrile normotensive.  Differential diagnosis includes ACS, dysrhythmia, pulmonary edema, pneumonia and pulmonary embolism.  Will obtain laboratory workup EKG chest x-ray and provide with DuoNeb treatment to see if that helps with the wheezing and shortness of breath.  No prior history of COPD or asthma.  Amount and/or Complexity of Data Reviewed Labs: ordered. Radiology: ordered.  Risk Prescription drug management.           Final Clinical Impression(s) / ED Diagnoses Final diagnoses:  Shortness of breath  Acute pulmonary edema [facility])    Rx / DC Orders ED Discharge Orders     None         Sallyanne Creamer, DO 09/30/23 2346

## 2023-09-30 NOTE — ED Triage Notes (Signed)
 Pt c/o SHOB that x 4d, worse over the last 2 days; hx of CABG in Nov; also c/o chest pressure

## 2023-10-01 MED ORDER — ALBUTEROL SULFATE HFA 108 (90 BASE) MCG/ACT IN AERS
2.0000 | INHALATION_SPRAY | RESPIRATORY_TRACT | Status: AC | PRN
Start: 2023-10-01 — End: ?

## 2023-10-01 MED ORDER — SODIUM CHLORIDE 0.9 % IV SOLN
2.0000 g | Freq: Once | INTRAVENOUS | Status: AC
  Administered 2023-10-01: 2 g via INTRAVENOUS
  Filled 2023-10-01: qty 20

## 2023-10-01 MED ORDER — ALBUTEROL SULFATE HFA 108 (90 BASE) MCG/ACT IN AERS
2.0000 | INHALATION_SPRAY | RESPIRATORY_TRACT | Status: DC | PRN
  Administered 2023-10-01: 2 via RESPIRATORY_TRACT
  Filled 2023-10-01: qty 6.7

## 2023-10-01 MED ORDER — CEFDINIR 300 MG PO CAPS: 300.0000 mg | ORAL_CAPSULE | Freq: Two times a day (BID) | ORAL | 0 refills | Status: DC

## 2023-10-01 NOTE — Discharge Instructions (Signed)
 Use the inhaler-2 puffs every 4 hours as needed for wheezing or shortness of breath.  Please do not miss any doses of your rivaroxaban (Xarelto).  Return to the emergency department if symptoms are getting worse.

## 2023-10-02 ENCOUNTER — Telehealth (HOSPITAL_COMMUNITY): Payer: Self-pay

## 2023-10-02 ENCOUNTER — Encounter (HOSPITAL_COMMUNITY): Admission: RE | Admit: 2023-10-02 | Payer: Medicare Other | Source: Ambulatory Visit

## 2023-10-02 NOTE — Telephone Encounter (Signed)
 Patient left message stating he would not be in for class today as he is on his way to the ER (no further explanation given). Is aware he was supposed to grad today, would like Shelagh Derrick to call him back.

## 2023-10-03 ENCOUNTER — Telehealth (HOSPITAL_COMMUNITY): Payer: Self-pay

## 2023-10-03 ENCOUNTER — Telehealth (HOSPITAL_COMMUNITY): Payer: Self-pay | Admitting: *Deleted

## 2023-10-03 NOTE — Telephone Encounter (Signed)
 Spoke to the patient. Johnathan Anderson says he is feeling better but is still experiencing some shortness of breath. Johnathan Anderson says that he has an appointment on 10/23/23 for follow up. I called Dr Wenona Hamilton office to see if Johnathan Anderson can be given an earlier appointment. Dr Wenona Hamilton office said that they will call the patient directly about making the appointment.Monte Ashford RN BSN

## 2023-10-03 NOTE — Telephone Encounter (Signed)
 Patient called stating he would not be in for class tomorrow 4/30, which is his grad date. States he was in ER on Saturday and has fluid in his lungs. Would like to speak to [facility] regarding his missed classes.

## 2023-10-04 ENCOUNTER — Encounter (HOSPITAL_COMMUNITY): Payer: Medicare Other

## 2023-10-06 ENCOUNTER — Encounter: Payer: Self-pay | Admitting: Gastroenterology

## 2023-10-10 ENCOUNTER — Other Ambulatory Visit: Payer: Self-pay | Admitting: Family Medicine

## 2023-10-10 ENCOUNTER — Encounter (HOSPITAL_COMMUNITY): Payer: Self-pay | Admitting: *Deleted

## 2023-10-10 ENCOUNTER — Telehealth (HOSPITAL_COMMUNITY): Payer: Self-pay | Admitting: *Deleted

## 2023-10-10 ENCOUNTER — Ambulatory Visit
Admission: RE | Admit: 2023-10-10 | Discharge: 2023-10-10 | Disposition: A | Discharge status: Home / Self Care | Source: Ambulatory Visit | Attending: Family Medicine | Admitting: Family Medicine

## 2023-10-10 DIAGNOSIS — Z951 Presence of aortocoronary bypass graft: Secondary | ICD-10-CM

## 2023-10-10 DIAGNOSIS — J189 Pneumonia, unspecified organism: Secondary | ICD-10-CM

## 2023-10-10 DIAGNOSIS — Z952 Presence of prosthetic heart valve: Secondary | ICD-10-CM

## 2023-10-10 NOTE — Progress Notes (Signed)
 Discharge Progress Report  Patient Details  Name: Johnathan Anderson MRN: [id number] Date of Birth: 05-22-1966 Referring Provider:     Number of Visits: 21  Reason for Discharge:  Patient reached a stable level of exercise. Patient independent in their exercise. Patient has met program and personal goals.  Smoking History:  Social History   Tobacco Use  Smoking Status Former   Types: Cigarettes  Smokeless Tobacco Never  Tobacco Comments   Pt quit smoking cigarettes in 2005. 1 pack of cigarettes per week.    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  ADL [facility]:   Initial Exercise Prescription:   Discharge Exercise Prescription (Final Exercise Prescription Changes):  Exercise Prescription Changes - 10/19/23 0900       Treadmill   MPH 2    Grade 0    Minutes 15    METs 2.53             Functional Capacity:  6 Minute Walk     Row Name 09/27/23 1635 09/29/23 0836       6 Minute Walk   Phase -- --    Distance 1256 feet --    Distance % Change 88.02 % --    Distance Feet Change 588 ft --    Walk Time 6 minutes --    # of Rest Breaks -- --    MPH -- --    METS -- --    RPE 13 --    Perceived Dyspnea  0 --    VO2 Peak -- --    Symptoms No --    Resting HR 66 bpm --    Resting BP 104/68 --    Max Ex. HR 89 bpm --    Max Ex. BP 122/64 --             Psychological, QOL, Others - Outcomes: PHQ 2/9:    07/11/2023   12:15 PM 07/06/2022    1:13 PM  Depression screen PHQ 2/9  Decreased Interest 1 0  Down, Depressed, Hopeless 0 0  PHQ - 2 Score 1 0  Altered sleeping 3   Tired, decreased energy 1   Change in appetite 0   Feeling bad or failure about yourself  0   Trouble concentrating 1   Moving slowly or fidgety/restless 2   Suicidal thoughts 0   PHQ-9 Score 8   Difficult doing work/chores Somewhat difficult     Quality of Life:  Quality of Life - 10/10/23 1549       Quality of Life   Select Quality  of Life   Pt did not exercise, just returned homework and visited staff after his recent discharge for Pneumonia     Quality of Life Scores   Health/Function Post 22.43 %    Socioeconomic Post 21.64 %    Psych/Spiritual Post 28.93 %    Family Post 30 %    GLOBAL Post 24.79 %             Personal Goals: Goals established at orientation with interventions provided to work toward goal.    Personal Goals Discharge:  Goals and Risk Factor Review     Row Name 08/31/23 1542 09/27/23 1244 10/10/23 0955         Core Components/Risk Factors/Patient Goals Review   Personal Goals Review Weight Management/Obesity;Hypertension;Lipids Weight Management/Obesity;Hypertension;Lipids Weight Management/Obesity;Hypertension;Lipids     Review Johnathan Anderson continues to do  well with exercise. VSS. Johnathan Anderson  has gained 2.5 kg since starting the program. Johnathan Anderson has increased his workloads. Johnathan Anderson will tenatively complete cardiac rehab on 10/04/23. Johnathan Anderson continues to do  well with exercise. VSS. Johnathan Anderson has gained 3.9 kg since starting the program. Johnathan Anderson has increased his workloads. Johnathan Anderson will tenatively complete cardiac rehab on 10/04/23. Johnathan Anderson continues to do  well with exercise. VSS. Johnathan Anderson has gained 3.6 kg since starting the program. Johnathan Anderson has increased his workloads. Johnathan Anderson last day was on 09/27/23. Johnathan Anderson had an upper respitory infection and did not get to return for his last week of exercise     Expected Outcomes Johnathan Anderson will continue to participate in cardiac rehab for exercise, nutrition and lifestyle modifications Johnathan Anderson will continue to participate in cardiac rehab for exercise, nutrition and lifestyle modifications Johnathan Anderson will continue to exercise, follow  nutrition and lifestyle modifications upon completion of cardiac rehab              Exercise Goals and Review:   Exercise Goals Re-Evaluation:  Exercise Goals Re-Evaluation     Row Name 08/30/23 1622 09/27/23 1630            Exercise Goal Re-Evaluation   Exercise Goals Review Increase Physical Activity;Understanding of Exercise Prescription;Increase Strength and Stamina;Knowledge and understanding of Target Heart Rate Range (THRR);Able to understand and use rate of perceived exertion (RPE) scale Increase Physical Activity;Understanding of Exercise Prescription;Increase Strength and Stamina;Knowledge and understanding of Target Heart Rate Range (THRR);Able to understand and use rate of perceived exertion (RPE) scale      Comments Reviewed MET's and goals. Pt tolerated exercise well with an average MET level of 2.85. Pt is feeling good about his goals and is increasing MET's and WL's. He also wanted to be able to do more around the house and walk more. He is doing much better Patients last day of exercise. Pt has now graduated the [employer]. Pt tolerated exercise well with an average MET level of 2.68. Pt did very well and added the tradmill as a new modality his last few weeks, he did very well with the change. He increased on his post by 535ft for a total of 1256 ft (88.02% increase!). He also increased his balance and flexibility. Overall he made great improvements and is doing very well. He will continue to exercise on his own by walking and using hand weights 3 days for 15 mins      Expected Outcomes Will continue to monitor pt and progress workloads as tolerated without sign or symptom Pt will continnue to exercise on his own and gain strength.               Nutrition & Weight - Outcomes:   Post Biometrics - 09/27/23 1637        Post  Biometrics   Height 5\' 7"  (1.702 m)    Weight 196 lb 6.9 oz (89.1 kg)    Waist Circumference 42 inches    Hip Circumference 44 inches    Waist to Hip Ratio 0.95 %    BMI (Calculated) 30.76    Triceps Skinfold 17 mm    % Body Fat 29.5 %    Grip Strength 30 kg    Flexibility 10.5 in    Single Leg Stand 30 seconds             Nutrition:  Nutrition  Therapy & Goals - 10/04/23 1538       Nutrition Therapy   Diet Heart Healthy Diet  Drug/Food Interactions Statins/Certain Fruits      Personal Nutrition Goals   Nutrition Goal Patient to identify strategies for reducing cardiovascular risk by attending the [employer] education and nutrition series weekly.   goal met.   Personal Goal #2 Patient to improve diet quality by using the plate method as a guide for meal planning to include lean protein/plant protein, fruits, vegetables, whole grains, nonfat dairy as part of a well-balanced diet.   goal met. PYP improved.   Comments Goals met. Johnathan Anderson has medical history of CABG x2, aortic valve repalcement, HTN, Hyperlipidemia. He has attended the [facility] education and nutrition series regularly and reports making heart healthy dietary changes. LDL is at goal. Triglycerides remain elevated.  He is up 8# since starting with our program. Answered patient questions regarding maintaining lean muscle mass/rebuilding lean mass lost during hospitalization. He reports normal appetite at this time. Patient will continue to benefit from adherence to heart healthy nutrition, exercise, and lifestyle modification.      Intervention Plan   Intervention Prescribe, educate and counsel regarding individualized specific dietary modifications aiming towards targeted core components such as weight, hypertension, lipid management, diabetes, heart failure and other comorbidities.;Nutrition handout(s) given to patient.    Expected Outcomes Short Term Goal: Understand basic principles of dietary content, such as calories, fat, sodium, cholesterol and nutrients.;Long Term Goal: Adherence to prescribed nutrition plan.             Nutrition Discharge:  Nutrition Assessments - 10/04/23 1541       Rate Your Plate Scores   Pre Score 62    Post Score 66             Education Questionnaire Score:  Knowledge Questionnaire Score - 10/19/23 1551       Knowledge  Questionnaire Score   Pre Score --    Post Score --             Goals reviewed with patient; copy given to patient.Pt graduates from  Intensive/Traditional cardiac rehab program on 09/27/23  with completion of  47 exercise and education sessions. Pt maintained good attendance and progressed nicely during their participation in rehab as evidenced by increased MET level. Johnathan Anderson did not complete his last week of exercise due to having an upper respiratory infection/pneumonia.   Pt has made significant lifestyle changes and should be commended for their success. Johnathan Anderson  achieved his  goals during cardiac rehab.   Pt plans to continue exercise at home walking 3 days a week, stretching and using 5 pound weights we are proud of Johnathan Anderson's progress! Johnathan Anderson Verner RN BSN

## 2023-10-10 NOTE — Telephone Encounter (Signed)
 Spoke with [employer]. Will mail diploma. Congratulated Jomo on completing cardiac rehab and his progression.Monte Tanav RN BSN

## 2023-10-11 ENCOUNTER — Encounter: Payer: Self-pay | Admitting: Internal Medicine

## 2023-10-11 ENCOUNTER — Ambulatory Visit: Payer: Medicare Other | Attending: Internal Medicine | Admitting: Internal Medicine

## 2023-10-11 VITALS — BP 110/70 | HR 65 | Ht 66.0 in | Wt 192.0 lb

## 2023-10-11 DIAGNOSIS — Z952 Presence of prosthetic heart valve: Secondary | ICD-10-CM | POA: Insufficient documentation

## 2023-10-11 DIAGNOSIS — I7121 Aneurysm of the ascending aorta, without rupture: Secondary | ICD-10-CM | POA: Diagnosis not present

## 2023-10-11 DIAGNOSIS — Z8249 Family history of ischemic heart disease and other diseases of the circulatory system: Secondary | ICD-10-CM | POA: Insufficient documentation

## 2023-10-11 DIAGNOSIS — Q2381 Bicuspid aortic valve: Secondary | ICD-10-CM | POA: Diagnosis not present

## 2023-10-11 NOTE — Progress Notes (Signed)
 Cardiology Office Note:  .    Date:  10/11/2023  ID:  Johnathan Anderson, DOB [birth year], MRN [id number] PCP: Johnathan Brady, MD  [facility] Providers Cardiologist:  Johnathan Melody, MD     CC: Follow up ED visit  History of Present Illness: .    Johnathan Anderson is a 58 y.o. male  with a history of a bicuspid aortic valve who presents for post-hospital follow-up after experiencing increased shortness of breath and chest heaviness. He was referred by Dr. Ivey Anderson for follow-up after treatment for pneumonia and evaluation of his cardiac condition.  He experienced increased shortness of breath and chest heaviness over the last three to four days prior to his emergency department visit. The shortness of breath worsened over four to five days, culminating in an inability to breathe on Saturday, prompting him to seek emergency care. He was diagnosed with pneumonia and noted to have fluid in the lungs, more on the right side than the left. He feels much better now, with breathing back to normal after receiving treatment, including an inhaler. However, he still experiences some congestion and a little coughing.  During his emergency department visit, an EKG showed sinus rhythm with PVCs, first-degree heart block, and some ventricular pacing. A CT scan was performed to rule out pulmonary embolism, revealing small to moderate bilateral pericardial effusions. Lab work showed slightly elevated glucose, normal creatinine, very elevated BNP, and nominally elevated Troponin T.  He has a history of open-heart surgery and a bioprosthetic valve placement. He is currently on a blood thinner due to a peri-surgical right IJ thrombus. He uses a leadless pacemaker, which is active 0.3% of the time, indicating minimal reliance on the device. He is also on aspirin  therapy.  No current chest pain or syncope. Breathing has improved but he still experiences some congestion and coughing. He confirms he can now lay flat  and breathe comfortably.  Relevant histories: .  Social  2022: imaging with stable valve disease and aortic size.   2023: Moderate to severe aortic stenosis. 2024 further increase in his aortic gradients.  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  Had plastic and vascular f/u 2025: On DOAC for LA thrombus ROS: As per HPI.   Studies Reviewed: .   Cardiac Studies & Procedures   ______________________________________________________________________________________________ CARDIAC CATHETERIZATION  CARDIAC CATHETERIZATION 12/28/2022  Conclusion Normal coronary anatomy Mildly elevated LV filling pressures. PCWP 21/20 with mean 19 mm Hg. LVEDP 23 mm Hg Mild pulmonary HTN. PAP 45/17 with mean 26 mm Hg Moderate Aortic stenosis by cath. Mean AV gradient 35 mm Hg with AVA 1.38 cm squared. Index 0.69. Cath data may underestimate severity of stenosis. Normal cardiac output 6.74 L/min, index 3.36  Plan: surgical consultation  Findings Coronary Findings Diagnostic  Dominance: Right  Left Main Vessel was injected. Vessel is normal in caliber. Vessel is angiographically normal.  Left Anterior Descending Vessel was injected. Vessel is normal in caliber. Vessel is angiographically normal.  Left Circumflex Vessel was injected. Vessel is normal in caliber. Vessel is angiographically normal.  Right Coronary Artery Vessel was injected. Vessel is normal in caliber. Vessel is angiographically normal.  Intervention  No interventions have been documented.     ECHOCARDIOGRAM  ECHOCARDIOGRAM COMPLETE 12/05/2022  Narrative ECHOCARDIOGRAM REPORT    [name] Date of Exam: 12/05/2022 Medical Rec #:  [id number]      Height:       66.0 in Accession #:  [id number]     Weight:       200.8 lb Date of Birth:  08-29-1965       BSA:          2.003 m Patient Age:    57 years       BP:           135/85 mmHg Patient Gender: M              HR:            59 bpm. Exam Location:  [facility]  Procedure: 2D Echo, Cardiac Doppler, Color Doppler, 3D Echo and Strain Analysis  Indications:    Q23.1 Bicuspid aortic valve; I35.0 Nonrheumatic aortic (valve) stenosis  History:        Patient has prior history of Echocardiogram examinations, most recent 06/02/2022. Risk Factors:Hypertension and Former Smoker. Aneurysm of ascending aorta without rupture. Family history of sudden cardiac death.  Sonographer:    Mylinda Asa RCS Referring Phys: Johnathan Anderson A Frutoso Dimare  IMPRESSIONS   1. Left ventricular ejection fraction, by estimation, is 60 to 65%. The left ventricle has normal function. The left ventricle has no regional wall motion abnormalities. Left ventricular diastolic parameters were normal. 2. Right ventricular systolic function is normal. The right ventricular size is normal. 3. Left atrial size was moderately dilated. 4. The mitral valve is abnormal. Trivial mitral valve regurgitation. No evidence of mitral stenosis. 5. The aortic valve is bicuspid. There is severe calcifcation of the aortic valve. There is severe thickening of the aortic valve. Aortic valve regurgitation is mild. Severe aortic valve stenosis. 6. Aortic dilatation noted. There is moderate dilatation of the ascending aorta, measuring 45 mm. 7. The inferior vena cava is normal in size with greater than 50% respiratory variability, suggesting right atrial pressure of 3 mmHg.  FINDINGS Left Ventricle: Left ventricular ejection fraction, by estimation, is 60 to 65%. The left ventricle has normal function. The left ventricle has no regional wall motion abnormalities. The left ventricular internal cavity size was normal in size. There is no left ventricular hypertrophy. Left ventricular diastolic parameters were normal.  Right Ventricle: The right ventricular size is normal. No increase in right ventricular wall thickness. Right ventricular systolic function is  normal.  Left Atrium: Left atrial size was moderately dilated.  Right Atrium: Right atrial size was normal in size.  Pericardium: There is no evidence of pericardial effusion.  Mitral Valve: The mitral valve is abnormal. There is mild thickening of the mitral valve leaflet(s). There is mild calcification of the mitral valve leaflet(s). Trivial mitral valve regurgitation. No evidence of mitral valve stenosis.  Tricuspid Valve: The tricuspid valve is normal in structure. Tricuspid valve regurgitation is mild . No evidence of tricuspid stenosis.  Aortic Valve: The aortic valve is bicuspid. There is severe calcifcation of the aortic valve. There is severe thickening of the aortic valve. Aortic valve regurgitation is mild. Severe aortic stenosis is present. Aortic valve mean gradient measures 39.0 mmHg. Aortic valve peak gradient measures 65.3 mmHg. Aortic valve area, by VTI measures 0.83 cm.  Pulmonic Valve: The pulmonic valve was normal in structure. Pulmonic valve regurgitation is mild. No evidence of pulmonic stenosis.  Aorta: Aortic dilatation noted. There is moderate dilatation of the ascending aorta, measuring 45 mm.  Venous: The inferior vena cava is normal in size with greater than 50% respiratory variability, suggesting right atrial pressure of 3 mmHg.  IAS/Shunts: No atrial level shunt detected by color flow Doppler.  LEFT VENTRICLE PLAX 2D LVIDd:         4.40 cm   Diastology LVIDs:         2.60 cm   LV e' medial:    9.46 cm/s LV PW:         0.90 cm   LV E/e' medial:  9.4 LV IVS:        0.80 cm   LV e' lateral:   16.30 cm/s LVOT diam:     2.00 cm   LV E/e' lateral: 5.5 LV SV:         83 LV SV Index:   41 LVOT Area:     3.14 cm  3D Volume EF: 3D EF:        59 % LV EDV:       125 ml LV ESV:       51 ml LV SV:        73 ml  RIGHT VENTRICLE RV Basal diam:  2.40 cm RV Mid diam:    2.60 cm RV S prime:     10.40 cm/s TAPSE (M-mode): 1.6 cm RVSP:           26.8  mmHg  LEFT ATRIUM             Index        RIGHT ATRIUM           Index LA diam:        4.30 cm 2.15 cm/m   RA Pressure: 3.00 mmHg LA Vol (A2C):   52.7 ml 26.31 ml/m  RA Area:     8.89 cm LA Vol (A4C):   38.1 ml 19.02 ml/m  RA Volume:   13.90 ml  6.94 ml/m LA Biplane Vol: 45.7 ml 22.81 ml/m AORTIC VALVE AV Area (Vmax):    0.76 cm AV Area (Vmean):   0.75 cm AV Area (VTI):     0.83 cm AV Vmax:           404.00 cm/s AV Vmean:          290.000 cm/s AV VTI:            0.990 m AV Peak Grad:      65.3 mmHg AV Mean Grad:      39.0 mmHg LVOT Vmax:         97.40 cm/s LVOT Vmean:        69.300 cm/s LVOT VTI:          0.263 m LVOT/AV VTI ratio: 0.27  AORTA Ao Root diam: 3.00 cm Ao Asc diam:  4.50 cm  MITRAL VALVE               TRICUSPID VALVE MV Area (PHT): 2.33 cm    TR Peak grad:   23.8 mmHg MV Decel Time: 326 msec    TR Vmax:        244.00 cm/s MV E velocity: 89.10 cm/s  Estimated RAP:  3.00 mmHg MV A velocity: 79.70 cm/s  RVSP:           26.8 mmHg MV E/A ratio:  1.12 SHUNTS Systemic VTI:  0.26 m Systemic Diam: 2.00 cm  Janelle Mediate MD Electronically signed by Janelle Mediate MD Signature Date/Time: 12/05/2022/5:09:36 PM    Final   TEE  ECHO INTRAOPERATIVE TEE 04/27/2023  Narrative *INTRAOPERATIVE TRANSESOPHAGEAL REPORT *    Patient Name:   MEKO HARTFIEL Date of Exam: 04/27/2023 Medical Rec #:  [id number]  Height:       66.0 in Accession #:    [id number]     Weight:       216.9 lb Date of Birth:  [birth year]       BSA:          2.07 m Patient Age:    57 years       BP:           118/93 mmHg Patient Gender: M              HR:           89 bpm. Exam Location:  Anesthesiology  Transesophogeal exam was perform intraoperatively during surgical procedure. Patient was closely monitored under general anesthesia during the entirety of examination.  Indications:     S/p aortic valve replacement, s/p VA ECMO cannulation, open chest Performing Phys: [id number] Melene Sportsman  Complications: No known complications during this procedure. PRE-OP FINDINGS Left Ventricle: The left ventricle has mild-moderately reduced systolic function, with an ejection fraction of 40-45%. The cavity size was normal. There is hypokinesis of the anterioseptal, septal, inferoseptal, and inferior walls.    Right Ventricle: The right ventricle has moderately reduced systolic function. The cavity was normal. There is no increase in right ventricular wall thickness.  Left Atrium: Left atrial size was normal in size. No left atrial/left atrial appendage thrombus was detected.  Right Atrium: Right atrial size was normal in size.  Interatrial Septum: No atrial level shunt detected by color flow Doppler.  Pericardium: There is no evidence of pericardial effusion.  Mitral Valve: The mitral valve is normal in structure. Mitral valve regurgitation is mild by color flow Doppler.  Tricuspid Valve: The tricuspid valve was normal in structure. Tricuspid valve regurgitation is mild by color flow Doppler.  Aortic Valve: The aortic valve has been repaired/replaced Aortic valve regurgitation was not visualized by color flow Doppler. There is a 23mm Inspiris valve in the aortic position. The valve appears well seated.    Pulmonic Valve: The pulmonic valve was normal in structure. Pulmonic valve regurgitation is not visualized by color flow Doppler.   Aorta: S/p Aortic root replacement.   Gwenevere Lent MD Electronically signed by Gwenevere Lent MD Signature Date/Time: 04/27/2023/4:12:48 PM    Final    CT SCANS  CT CORONARY MORPH W/CTA COR W/SCORE 11/30/2020  Addendum 11/30/2020  5:48 PM ADDENDUM REPORT: 11/30/2020 17:46  CLINICAL DATA:  Aortic Stenosis Assessment  EXAM: Cardiac TAVR CT  TECHNIQUE: The patient was scanned on a Siemens Force 192 slice scanner. A 120 kV retrospective scan was triggered in the descending thoracic aorta at 111 HU's. Gantry rotation speed was  270 msecs and collimation was .9 mm. No beta blockade or nitro were given. The 3D data set was reconstructed in 5% intervals of the R-R cycle. Systolic and diastolic phases were analyzed on a dedicated work station using MPR, MIP and VRT modes. The patient received 100 cc of contrast.  FINDINGS: Aortic Valve: Moderately thickened Siever's 1 bicuspid aortic valve with the planimeter valve area is 2.62 cm2  LVOT calcification: None  Annular calcification: Minimal  Aortic Valve Calcium  Score: 420  Prosthetic Valve: NA  Mitral Valve: No mitral annular calcification  Left Ventricular Function: LVEF 60% without wall motion abnormality  Aortic Annulus Measurements  Major annulus diameter: 28 mm  Minor annulus diameter: 21 mm  Annular perimeter: 76 mm  Annular area: 4.43 cm2  Aortic Root Measurements  Sinus of Valsalva perimeter: 93 mm  Sinotubular Junction: 2.5 cm  Ascending Thoracic Aorta: 45 mm  Aortic Arch: 37 mm  Descending Thoracic Aorta: 23 mm  Sinus of Valsalva Measurements:  Right coronary cusp width: 26 mm  Left coronary cusp width: 26 mm  Non coronary cusp width: 29 mm  Coronary Artery Height above Annulus:  Left Main: 11 mm  Right Coronary: 8 mm  Coronary Arteries: No nitroglycerin  given for coronary artery patency assessment  Coronary Artery Calcium  score:  Coronary Calcium  Score:  Left main: 0  Left anterior descending artery: 0.366  Left circumflex artery: 0  Right coronary artery: 0  Total: 0.366  Percentile: 56th for age, sex, and race matched control.  Optimum Fluoroscopic Angle for Delivery if TAVR were deployed: LAO 2, CAU 2  IMPRESSION: 1. Anatomically mild aortic stenosis of a bicuspid valve. Findings pertinent to TAVR procedure are detailed above, though at this time valve team assessment is deferred.  2. Moderate ascending aortic aneurysm without evidence of concomitant coarctation of the aorta.  3. Patient's  total coronary artery calcium  score is 0.366, which is 56th percentile for subjects of the same age, gender, and race based populations.  RECOMMENDATIONS:  Coronary artery calcium  (CAC) score is a strong predictor of incident coronary heart disease (CHD) and provides predictive information beyond traditional risk factors. CAC scoring is reasonable to use in the decision to withhold, postpone, or initiate statin therapy in intermediate-risk or selected borderline-risk asymptomatic adults (age 25-75 years and LDL-C >=70 to <190 mg/dL) who do not have diabetes or established atherosclerotic cardiovascular disease (ASCVD).* In intermediate-risk (10-year ASCVD risk >=7.5% to <20%) adults or selected borderline-risk (10-year ASCVD risk >=5% to <7.5%) adults in whom a CAC score is measured for the purpose of making a treatment decision the following recommendations have been made:  If CAC = 0, it is reasonable to withhold statin therapy and reassess in 5 to 10 years, as long as higher risk conditions are absent (diabetes mellitus, family history of premature CHD in first degree relatives (males <55 years; females <65 years), cigarette smoking, LDL >=190 mg/dL or other independent risk factors).  If CAC is 1 to 99, it is reasonable to initiate statin therapy for patients >=34 years of age.  If CAC is >=100 or >=75th percentile, it is reasonable to initiate statin therapy at any age.  Cardiology referral should be considered for patients with CAC scores >=400 or >=75th percentile.  *2018 AHA/ACC/AACVPR/AAPA/ABC/ACPM/ADA/AGS/APhA/ASPC/NLA/PCNA Guideline on the Management of Blood Cholesterol: A Report of the American College of Cardiology/American Heart Association Task Force on Clinical Practice Guidelines. J Am Coll Cardiol. 2019;73(24):3168-3209.  Coltrane Tugwell   Electronically Signed By: Gloriann Larger MD On: 11/30/2020 17:46  Narrative EXAM: OVER-READ  INTERPRETATION  CT CHEST  The following report is an over-read performed by radiologist Dr. Nicoletta Barrier of [facility] on 11/30/2020. This over-read does not include interpretation of cardiac or coronary anatomy or pathology. The coronary CTA interpretation by the cardiologist is attached.  COMPARISON:  None.  FINDINGS: Vascular: Heart size normal. No pericardial effusion. Fair contrast opacification of central pulmonary arteries without evident filling defect to suggest acute PE. Aortic valve leaflet coarse calcifications. 4.4 cm ascending thoracic aortic aneurysm. No aortic dissection or stenosis. 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  Mediastinum/Nodes: No mass or adenopathy.  Lungs/Pleura: No pleural effusion. No pneumothorax. 3 mm subpleural nodule, superior segment right lower lobe (Im41,Se10) . 3 mm subpleural nodule, lateral basal segment left lower lobe  image 55. Lungs otherwise clear.  Upper Abdomen: See separate CTA abdomen from the same  Musculoskeletal: No chest wall mass or suspicious bone lesions identified.  IMPRESSION: Small bilateral pulmonary nodules less than 4 mm. No follow-up needed if patient is low-risk. Non-contrast chest CT can be considered in 12 months if patient is high-risk. This recommendation follows the consensus statement: Guidelines for Management of Incidental Pulmonary Nodules Detected on CT Images: From the Fleischner Society 2017; Radiology 2017; 284:228-243.  4.4 cm ascending thoracic aortic aneurysm. Recommend annual imaging followup by CTA or MRA. This recommendation follows 2010 ACCF/AHA/AATS/ACR/ASA/SCA/SCAI/SIR/STS/SVM Guidelines for the Diagnosis and Management of Patients with Thoracic Aortic Disease. Circulation. 2010; 121: Z610-R604  Electronically Signed: By: Nicoletta Barrier M.D. On: 11/30/2020 12:43      ______________________________________________________________________________________________       Physical Exam:    VS:  BP 110/70 (BP Location: Right Arm)   Pulse 65   Ht 5\' 6"  (1.676 m)   Wt 192 lb (87.1 kg)   SpO2 98%   BMI 30.99 kg/m    Wt Readings from Last 3 Encounters:  10/11/23 192 lb (87.1 kg)  09/30/23 196 lb 6.9 oz (89.1 kg)  09/27/23 196 lb 6.9 oz (89.1 kg)    Gen: no distress  Neck: No JVD Cardiac: No Rubs or Gallops, crisp heart sounds murmur, RRR +2 radial pulses Respiratory: Clear to auscultation bilaterally, normal effort, normal  respiratory rate GI: Soft, nontender, non-distended  MS: No  edema;  moves all extremities Integument: sternotomy is well healed Neuro:  At time of evaluation, alert and oriented to person/place/time/situation  Psych: Normal affect, patient feels well   ASSESSMENT AND PLAN: .    Heart failure with preserved ejection fraction Elevated BNP and symptoms of dyspnea and congestion suggest heart failure with preserved ejection fraction. Post-treatment for pneumonia, he reports improved breathing but some residual congestion. Lung examination shows no significant fluid accumulation. 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 Valve function is currently unremarkable. Pacing at 0.3% is not indicative of RV pacemaker syndrome. - Repeat echocardiogram in 2-3 months  Premature ventricular contractions (PVCs) - ED EKG shows PVCs without symptoms or concerns.  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 Discussion with Dr. Ivey Anderson about potential anticoagulation discontinuation due to surgical nature of thrombus. Risks of discontinuation considered, especially with  upcoming travel. - Discuss anticoagulation plan with Dr. Ivey Anderson - Consider discontinuing Xarelto and continuing aspirin  if in agreement with Dr. Ivey Anderson - Advise use of compression socks and movement during flight if anticoagulation is discontinued   Longitudinal care: The evaluation and management services provided today reflect the complexity inherent in caring for this patient, including the ongoing longitudinal relationship and management of multiple chronic conditions and/or the need for care coordination. The visit required a comprehensive assessment and management plan tailored to the patient's unique needs Time was spent addressing not only the acute concerns but also the broader context of the patient's health, including preventive care, chronic disease management, and care coordination as appropriate.  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  [address] [place], [place] [address] [phone]  10:18 AM

## 2023-10-11 NOTE — Patient Instructions (Signed)
 Medication Instructions:  Your physician recommends that you continue on your current medications as directed. Please refer to the Current Medication list given to you today.  *If you need a refill on your cardiac medications before your next appointment, please call your pharmacy*  Lab Work: NONE  If you have labs (blood work) drawn today and your tests are completely normal, you will receive your results only by: MyChart Message (if you have MyChart) OR A paper copy in the mail If you have any lab test that is abnormal or we need to change your treatment, we will call you to review the results.  Testing/Procedures: JULY 2025- - Your physician has requested that you have an echocardiogram. Echocardiography is a painless test that uses sound waves to create images of your heart. It provides your doctor with information about the size and shape of your heart and how well your heart's chambers and valves are working. This procedure takes approximately one hour. There are no restrictions for this procedure. Please do NOT wear cologne, perfume, aftershave, or lotions (deodorant is allowed). Please arrive 15 minutes prior to your appointment time.  Please note: We ask at that you not bring children with you during ultrasound (echo/ vascular) testing. Due to room size and safety concerns, children are not allowed in the ultrasound rooms during exams. Our front office staff cannot provide observation of children in our lobby area while testing is being conducted. An adult accompanying a patient to their appointment will only be allowed in the ultrasound room at the discretion of the ultrasound technician under special circumstances. We apologize for any inconvenience.   Follow-Up: At [facility], you and your health needs are our priority.  As part of our continuing mission to provide you with exceptional heart care, our providers are all part of one team.  This team includes your primary  Cardiologist (physician) and Advanced Practice Providers or APPs (Physician Assistants and Nurse Practitioners) who all work together to provide you with the care you need, when you need it.  Your next appointment:   3 month(s)  Provider:   Dayna Dunn, PA-C or Marlyse Single, PA-C

## 2023-10-12 ENCOUNTER — Telehealth: Payer: Self-pay | Admitting: *Deleted

## 2023-10-12 NOTE — Telephone Encounter (Signed)
 LVM to confirm appt for 5/12 - included address and time

## 2023-10-16 ENCOUNTER — Encounter (INDEPENDENT_AMBULATORY_CARE_PROVIDER_SITE_OTHER): Payer: Self-pay | Admitting: Otolaryngology

## 2023-10-16 ENCOUNTER — Ambulatory Visit (INDEPENDENT_AMBULATORY_CARE_PROVIDER_SITE_OTHER): Payer: Medicare Other | Admitting: Otolaryngology

## 2023-10-16 VITALS — BP 130/85 | HR 78

## 2023-10-16 DIAGNOSIS — J383 Other diseases of vocal cords: Secondary | ICD-10-CM | POA: Diagnosis not present

## 2023-10-16 DIAGNOSIS — R490 Dysphonia: Secondary | ICD-10-CM

## 2023-10-16 DIAGNOSIS — J3089 Other allergic rhinitis: Secondary | ICD-10-CM

## 2023-10-16 DIAGNOSIS — R0982 Postnasal drip: Secondary | ICD-10-CM

## 2023-10-16 DIAGNOSIS — R0981 Nasal congestion: Secondary | ICD-10-CM

## 2023-10-16 DIAGNOSIS — K219 Gastro-esophageal reflux disease without esophagitis: Secondary | ICD-10-CM | POA: Diagnosis not present

## 2023-10-16 DIAGNOSIS — R0989 Other specified symptoms and signs involving the circulatory and respiratory systems: Secondary | ICD-10-CM

## 2023-10-16 MED ORDER — FAMOTIDINE 40 MG PO TABS: 40.0000 mg | ORAL_TABLET | Freq: Every day | ORAL | 3 refills | Status: DC

## 2023-10-16 MED ORDER — OMEPRAZOLE 40 MG PO CPDR: 40.0000 mg | DELAYED_RELEASE_CAPSULE | Freq: Every day | ORAL | 3 refills | Status: DC

## 2023-10-16 NOTE — Progress Notes (Unsigned)
 ENT Progress Note:   Update 10/16/2023  Discussed the use of AI scribe software for clinical note transcription with the patient, who gave verbal consent to proceed.  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  He has a persistent cough and a sensation of having something in his throat, which triggers the cough. Despite these symptoms, there have been improvements with speech therapy and . He is no longer in speech therapy and can eat without difficulty. His current medications include omeprazole  in the morning, Pepcid  at night, and a supplement called Reflux Gourmet after meals. He uses Flonase  but not Zyrtec .  Two weeks ago, he visited the emergency room due to difficulty breathing, fearing it might be another heart attack. He was diagnosed with pneumonia, confirmed by x-rays, and treated. He reports improvement, although the cough persists. He is uncertain whether the cough is due to the pneumonia or the vocal process granuloma.  His past medical history includes a previous heart attack. He has been prescribed antacid medications to manage his reflux. He reports significant improvement in swallowing since the last visit and no longer experiences pain when speaking.   Records Reviewed:  Initial Evaluation  Reason for Consult: dysphagia and dysphonia hx of intubation x 2 after CABG  HPI: Discussed the use of AI scribe software for clinical note transcription with the patient, who gave verbal consent to proceed.  History of Present Illness   Johnathan Anderson is a 58 year old male who presents with problems with swallowing and voice changes following recent admission and prolonged hospital stay after open heart surgery (admitted around Thanksgiving 2024). He was referred by a speech therapist for further evaluation.  He experiences problems with swallowing and voice changes following his recent  hospitalization for open heart surgery. His voice is described as 'hoarse' and not sounding like his usual voice. He also experiences frequent throat clearing and a sensation of something being stuck in his throat, which occurs with swallowing anything, including water  and food. No coughing or choking when drinking water . He recalls a recent episode where he had a prolonged coughing fit after a phone call, which resulted in coughing up mucus, although this resolved by the next morning.  He was discharged on May 30, 2023, after being admitted on April 26, 2023. During his hospital stay, he was intubated twice, which he believes may have contributed to his current symptoms. Prior to his admission, he had no issues with swallowing.  He has a history of heartburn but is not currently taking any medication for it. He previously used omeprazole  but discontinued it before his surgery. He has lost approximately 19 pounds since his surgery, going from 204 pounds to 185 pounds.  A swallow study conducted at the end of January 2025 showed normal swallowing function. He has no history of stroke. He is currently seeing a speech therapist as an outpatient.      Records Reviewed:  SLP note 07/12/23 Johnathan Anderson is a 58 y.o. male with bicuspid AoV with stenosis and ascending aortic aneurysm who is s/p [employer], CABG x 2 (SVG-RCA, SVG-LAD) on 11/20 at OSH c/b cardiac arrest and bleeding requiring return to OR for exploration, MTP and cannulation of central VA ECMO cannulation 11/21. He was transferred to [facility] on 11/26 for evaluation of advanced heart failure therapies and went to OR on 11/27 for VA ECMO decannulation and IABP placement.  The hospital course was significant for the following:. Hypoxic respiratory failure: Required mechanical ventilation, extubated 12/10. O2 was weaned while inpatient. Bronchoscopies were performed prn - last on 12/9.  Dysphagia: Speech and nutrition were consulted. FEEs on  12/20 cleared for soft and bite sized. DHT was placed for enteral nutrition and removed on 12/23 .Diet at time of discharge reg/thins."   Patient is a 58 y.o. M who was seen today for dysphagia and concerns regarding persistent globus sensation following medical complications of cardiac surgeries. Intubated x2 with prolonged NPO status. Discharged from hospital on regular textures/thin liquids. MBSS on 07/06/23 revealed functional oropharyngeal swallow and unremarkable sweep of esophagus. Pt tolerated thin, mildly thick, moderately thick liquids, puree, regular solids, and small pill with thin liquids with no overt challenges noted. Reported hx of GERD but is not taking PPI currently. Presented with usual throat clearing. Presents with mild hoarseness and roughness. Endorsed drastic change in voice and impaired breath support impacting clarity and intensity as conversation progresses. Pt has scheduled visit to ENT to further evaluate larynx and swallow function. Update POC pending those results. Pt would benefit from skilled ST intervention to optimize vocal hygiene and improve voicing to aid return to baseline.      Past Medical History:  Diagnosis Date   Aortic aneurysm [facility])    Arthritis    Left Knee   Cancer (HCC) 2022   Skin Cancer   GERD (gastroesophageal reflux disease)    Headache    Migraines   Heart murmur    Hyperlipidemia    Hypertension    Peripheral vascular disease (HCC)    Thoracic Aortic Aneurysm   Vertigo     Past Surgical History:  Procedure Laterality Date   AORTIC VALVE REPLACEMENT N/A 04/26/2023   Procedure: AORTIC VALVE REPLACEMENT (AVR) USING 23 MM KONECT RESILIA AORTIC VALVED CONDUIT;  Surgeon: Melene Sportsman, MD;  Location: [facility] OR;  Service: Open Heart Surgery;  Laterality: N/A;   APPENDECTOMY     1990s   CANNULATION FOR ECMO (EXTRACORPOREAL MEMBRANE OXYGENATION) N/A 04/26/2023   Procedure: CENTRAL CANNULATION FOR ECMO (EXTRACORPOREAL MEMBRANE OXYGENATION);   Surgeon: Melene Sportsman, MD;  Location: [facility] OR;  Service: Open Heart Surgery;  Laterality: N/A;   COLONOSCOPY WITH ESOPHAGOGASTRODUODENOSCOPY (EGD)     2020s   CORONARY ARTERY BYPASS GRAFT N/A 04/26/2023   Procedure: CORONARY ARTERY BYPASS GRAFTING (CABG) x 2 USING RIGHT AND LEFT GREATER SAPHENOUS VEIN HARVESTED OPEN;  Surgeon: Melene Sportsman, MD;  Location: [facility] OR;  Service: Open Heart Surgery;  Laterality: N/A;   CORONARY ARTERY BYPASS GRAFT     EXPLORATION POST OPERATIVE OPEN HEART N/A 04/26/2023   Procedure: EXPLORATION POST OPERATIVE OPEN HEART;  Surgeon: Melene Sportsman, MD;  Location: [facility] OR;  Service: Open Heart Surgery;  Laterality: N/A;   EXPLORATION POST OPERATIVE OPEN HEART N/A 04/27/2023   Procedure: EXPLORATION POST OPERATIVE OPEN HEART;  Surgeon: Melene Sportsman, MD;  Location: [facility] OR;  Service: Open Heart Surgery;  Laterality: N/A;   EYE SURGERY Right    eye tissue removed   MOUTH SURGERY  2022   Skin Cancer Removal   REPLACEMENT ASCENDING AORTA N/A 04/26/2023   Procedure: REPLACEMENT ASCENDING AORTA USING 23 MM KONECT RESILIA AORTIC VALVED CONDUIT;  Surgeon: Melene Sportsman, MD;  Location: [facility] OR;  Service: Open Heart Surgery;  Laterality: N/A;   RIGHT/LEFT HEART CATH AND CORONARY ANGIOGRAPHY N/A 12/28/2022   Procedure: RIGHT/LEFT HEART CATH AND CORONARY ANGIOGRAPHY;  Surgeon: Swaziland, Peter M, MD;  Location: [facility]  INVASIVE CV LAB;  Service: Cardiovascular;  Laterality: N/A;   STERIOD INJECTION  09/16/2011   Procedure: MINOR STEROID INJECTION;  Surgeon: Arnoldo Lapping, MD;  Location:  SURGERY CENTER;  Service: Orthopedics;  Laterality: Left;  Left Lumbar Five-Sacral One Transforaminal Epidural Steroid Injection   STERNAL WOUND DEBRIDEMENT N/A 05/01/2023   Procedure: MEDIAL STERNAL WOUND DEBRIDEMENT;  Surgeon: Shon Downing, MD;  Location: [facility] OR;  Service: Thoracic;  Laterality: N/A;   TEE WITHOUT CARDIOVERSION N/A 11/05/2020   Procedure: TRANSESOPHAGEAL ECHOCARDIOGRAM (TEE);   Surgeon: Jann Melody, MD;  Location: [facility] ENDOSCOPY;  Service: Cardiovascular;  Laterality: N/A;   TEE WITHOUT CARDIOVERSION N/A 04/26/2023   Procedure: TRANSESOPHAGEAL ECHOCARDIOGRAM;  Surgeon: Melene Sportsman, MD;  Location: [facility] OR;  Service: Open Heart Surgery;  Laterality: N/A;   TEE WITHOUT CARDIOVERSION N/A 04/27/2023   Procedure: TRANSESOPHAGEAL ECHOCARDIOGRAM (TEE);  Surgeon: Melene Sportsman, MD;  Location: [facility] OR;  Service: Open Heart Surgery;  Laterality: N/A;   TEE WITHOUT CARDIOVERSION N/A 05/01/2023   Procedure: TRANSESOPHAGEAL ECHOCARDIOGRAM (TEE);  Surgeon: Shon Downing, MD;  Location: [facility] OR;  Service: Thoracic;  Laterality: N/A;    Family History  Problem Relation Age of Onset   Hypertension Mother    Diabetes Father    Migraines Neg Hx     Social History:  reports that he has quit smoking. His smoking use included cigarettes. He has never used smokeless tobacco. He reports that he does not drink alcohol and does not use drugs.  Allergies:  Allergies  Allergen Reactions   Levaquin [Levofloxacin]     unknown    Medications: I have reviewed the patient's current medications.  The PMH, PSH, Medications, Allergies, and SH were reviewed and updated.  ROS: Constitutional: Negative for fever, weight loss and weight gain. Cardiovascular: Negative for chest pain and dyspnea on exertion. Respiratory: Is not experiencing shortness of breath at rest. Gastrointestinal: Negative for nausea and vomiting. Neurological: Negative for headaches. Psychiatric: The patient is not nervous/anxious  Blood pressure 130/85, pulse 78, SpO2 97%. There is no height or weight on file to calculate BMI.  PHYSICAL EXAM:  Exam: General: Well-developed, well-nourished Communication and Voice: raspy Respiratory Respiratory effort: Equal inspiration and expiration without stridor Cardiovascular Peripheral Vascular: Warm extremities with equal color/perfusion Eyes: No nystagmus with  equal extraocular motion bilaterally Neuro/Psych/Balance: Patient oriented to person, place, and time; Appropriate mood and affect; Gait is intact with no imbalance; Cranial nerves I-XII are intact Head and Face Inspection: Normocephalic and atraumatic without mass or lesion Palpation: Facial skeleton intact without bony stepoffs Salivary Glands: No mass or tenderness Facial Strength: Facial motility symmetric and full bilaterally ENT Pinna: External ear intact and fully developed External canal: Canal is patent with intact skin Tympanic Membrane: Clear and mobile External Nose: No scar or anatomic deformity Internal Nose: Septum is deviated to the left. No polyp, or purulence. Mucosal edema and erythema present.  Bilateral inferior turbinate hypertrophy.  Lips, Teeth, and gums: Mucosa and teeth intact and viable TMJ: No pain to palpation with full mobility Oral cavity/oropharynx: No erythema or exudate, no lesions present Nasopharynx: No mass or lesion with intact mucosa Hypopharynx: Intact mucosa without pooling of secretions Larynx Glottic: Full true vocal cord mobility with previously seen right vocal process granuloma that appears significantly smaller that before Supraglottic: Normal appearing epiglottis and AE folds Interarytenoid Space: Moderate pachydermia&edema Subglottic Space: Patent without lesion or edema Neck Neck and Trachea: Midline trachea without mass or lesion Thyroid: No mass or nodularity Lymphatics:  No lymphadenopathy  Procedure: Preoperative diagnosis: dysphonia and throat clearing   Postoperative diagnosis:   Same + R VF lesion (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   Indirect laryngoscopy view was incomplete. Thus it was recommended that they undergo a flexible fiberoptic laryngoscopy. All of the risks, benefits, and potential complications were reviewed with the patient preoperatively and verbal informed consent was obtained.  Procedure: The patient was seated upright in the clinic. Topical lidocaine  and Afrin were applied to the nasal cavity. After adequate anesthesia had occurred, I then proceeded to pass the flexible telescope into the nasal cavity. The nasal cavity was patent without rhinorrhea or polyp. The nasopharynx was also patent without mass or lesion. The base of tongue was visualized and was normal. There were no signs of pooling of secretions in the piriform sinuses. The true vocal folds were mobile bilaterally. There were no signs of glottic or supraglottic mucosal lesion or mass, previously seen R vocal process lesion appears smaller on today's exam. There was moderate interarytenoid pachydermia and post cricoid edema. The telescope was then slowly withdrawn and the patient tolerated the procedure throughout.      Studies Reviewed: MBS 07/06/23 HPI: Pt presenting with dysphagia following medical complications of cardiac surgeries. Intubated x2 with prolonged NPO status. Discharged from hospital on regular textures/thin liquids. Currently consuming dysphagia 2/3 per pt preference. Reported persistent globus sensation with and without PO. Reported hx of GERD but reports he "doesn't have it anymore" and is not taking PPI. Reported chronic pill dysphagia with preference for crushing meds. Presenting with habitual throat clearing prior to PO administration, reporting is chronic.   Clinical Impression: Clinical Impression: Mr. Anderson was seen for Modified Barium Swallow Study to assess swallow physiology and safety. He presents with functional oropharyngeal swallow and unremarkable sweep of esophagus. He tolerated thin, mildly thick, moderately thick  liquids, puree, regular solids, and small pill with thin liquids with no overt challenges noted. Oral phase demonstrating good lingual control during directed bolus hold, brisk A-P transit of boluses into pharynx, with pharyngeal swallow triggered as bolus passed base of tongue. Occasional trace amount of spillage into pyriorms with sequential sips of thin liquids. Pharyngeal phase c/b intact hyolaryngeal elevation, adequate laryngeal vestibule closure, full epiglottic inversion, and WNL pharyngeal stripping wave. Airway protection intact throughout all trials with no airway invasion demonstrated. Pt reports will take small pills whole with water , simulated this date with  barium tablet. Safe and efficient passage through pharynx, timely and complete clearance through esophagus into stomach. No complaints of globus during today's evaluation. X1 throat clear evidenced with no contrast noted in airway or residuals in pharynx. Reviewed recommendation for evaluation with ENT d/t suspected laryngopharyngeal reflux. Suggest ongoing OP ST for reducing vocally abusive behaviors and optimizing vocal quality as pt reporting change in baseline. No diet texture modifications are warranted based on today's examination.  Assessment/Plan: Encounter Diagnoses  Name Primary?   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 Symptoms include persistent throat clearing, voice changes, and sensation of something in the throat.. Management includes aggressive reflux control and potential surgical removal if medical management fails to resolve  granuloma.  We discussed that many patients improve with reflux therapy alone, avoiding surgery. Surgery involves intubation, laser removal, and steroid injection, typically outpatient with minimal recovery. - Prescribe omeprazole  40 mg 30 minutes before breakfast - Prescribe famotidine  40 mg at night - Recommend dietary changes to avoid reflux triggers - Advise against late meals and lying down immediately after eating - Recommend seaweed-based supplement reflux Gourmet - Prescribe Zyrtec  10 mg daily for postnasal drainage - Prescribe Flonase  nasal spray 2 puffs bilateral nares twice daily - Recommend ocean spray for nasal moisture - Schedule follow-up in three months to reassess with repeat scope exam  Dysphagia sx, choking and throat clearing  Recent MBS with intact oropharyngeal swallowing Symptoms could be related to uncontrolled GERD LPR, and we discussed that his globus sensation could be related to GERD LPR -Management of GERD  Gastroesophageal Reflux Disease (GERD) Current symptoms potentially contributing to vocal process granuloma and throat clearing. Previously took omeprazole  but not on medication recently. Reflux control might alleviate throat clearing and sensation of something in the throat. - Prescribe omeprazole  40 mg 30 minutes before breakfast - Prescribe famotidine  40 mg at night - Recommend dietary changes to avoid reflux triggers - Advise against late meals and lying down immediately after eating - Recommend seaweed-based supplement reflux Gourmet  Postnasal Drip Postnasal drainage observed on scope exam, contributing to throat clearing and sensation of mucus in the throat. Postnasal drainage might be causing throat irritation. - Prescribe Zyrtec  10 mg daily for postnasal drainage - Prescribe Flonase  nasal spray 2 puffs bilateral nares twice daily - Recommend ocean spray for nasal moisture  Follow-up - Schedule follow-up in three months to reassess granuloma.       Update 10/16/2023  Assessment & Plan Vocal process granuloma Granuloma reduced in size based on exam today, significantly. Improved swallowing and painless voice. Recent pneumonia may contribute to cough. He is on dual reflux medication and has been for 3 months. Has cough but it is unclear if it is from recent PNA.  - Continue omeprazole  AM and Pepcid  PM for reflux. - Continue Reflux Gourmet postprandially. - Follow-up in three months to reassess granuloma and symptoms. - Discuss surgical removal if granuloma persists.  Gastroesophageal Reflux Disease (GERD) Current symptoms potentially contributing to vocal process granuloma and throat clearing. Previously took omeprazole  but not on medication recently. Reflux control might alleviate throat clearing and sensation of something in the throat. - continue omeprazole  40 mg 30 minutes before breakfast - continue famotidine  40 mg at night - Recommend dietary changes to avoid reflux triggers - Advise against late meals and lying down immediately after eating - Recommend seaweed-based supplement reflux Gourmet  Chronic nasal congestion Postnasal Drip Postnasal drainage observed on scope exam, contributing to throat clearing and sensation of mucus in the throat. Postnasal drainage might be causing throat irritation. - Prescribe Zyrtec  10 mg daily for postnasal drainage - Prescribe Flonase  nasal spray 2 puffs bilateral nares twice daily - Recommend ocean spray for nasal moisture  RTC 3 mo for repeat scope exam    Artice Last, MD Otolaryngology [facility] Phone: [phone] Fax: [phone]    10/16/2023, 4:38 PM

## 2023-10-16 NOTE — Telephone Encounter (Signed)
 Called pt advised of MD recommendation:  "Hey Mr. Johnathan Anderson,   I talked with Dr. Ivey Marlin.  We reviewed your chart.  We could not find an un-provoked blood clot.  We could not find atrial fibrillation.  We could not find a reason why you must be on blood thinners at this point.  Our teammates at [facility] have not specified the time of stopping the blood thinners after we reached out to them earlier in the year.   At this point, we think it would be reasonable to stop the blood thinners.  We would keep the aspirin .  I discussed the risks and benefits with you in clinic.  If you are amenable, when you run out of the blood thinner, we will stop at that time.   Let me know if you have questions or concerns,   Dr. Tita Form"  Pt is agreeable to plan will finish Xarelto on hand and stop and will continue taking Aspirin  81 mg PO every day.  Pt had no questions or concerns.

## 2023-10-19 NOTE — Addendum Note (Signed)
 Encounter addended by: Pablo Boards S on: 10/19/2023 9:08 AM  Actions taken: Flowsheet data copied forward, Flowsheet accepted

## 2023-10-19 NOTE — Addendum Note (Signed)
 Encounter addended by: Pablo Boards S on: 10/19/2023 3:53 PM  Actions taken: Flowsheet accepted

## 2023-11-30 ENCOUNTER — Encounter: Payer: Self-pay | Admitting: Internal Medicine

## 2023-11-30 DIAGNOSIS — Z952 Presence of prosthetic heart valve: Secondary | ICD-10-CM

## 2023-11-30 DIAGNOSIS — R002 Palpitations: Secondary | ICD-10-CM

## 2023-11-30 DIAGNOSIS — I459 Conduction disorder, unspecified: Secondary | ICD-10-CM

## 2023-11-30 DIAGNOSIS — R0602 Shortness of breath: Secondary | ICD-10-CM

## 2023-12-01 NOTE — Telephone Encounter (Signed)
 Called pt to f/u PCP request for stress test.  Pt reports fatigue, SOB for the past 3-4 weeks.  Noted with normal walking.  Had labs drawn with PCP per pt unremarkable except cholesterol a little high.    Pt reports palpitations sometimes feels as if heart is beating faster than normal.  This occurs with and without activity.  BP runs 117-120'2/80  HR 48-102.  Reports HR mainly in low 50's.   Pt has a PPM managed by [facility].  Advised pt to reach out to [facility] to check PPM settings.  Pt reports had a PPM check on 11/23/23 was told everything looked good.    Spoke with MD advised that pt contact [facility] for PPM support or have care switched to [facility].  Also gave okay for Exercise NMST.  Pt would like to switch care to [facility].  I have placed a referral to EP.  Advised pt MD ok for Exercise NMST.  Pt would like to have instructions sent to him via My Chart; sent as requested as a letter.

## 2023-12-01 NOTE — Addendum Note (Signed)
 Addended by: RANDY HAMP SAILOR on: 12/01/2023 12:17 PM   Modules accepted: Orders

## 2023-12-04 ENCOUNTER — Telehealth (HOSPITAL_COMMUNITY): Payer: Self-pay

## 2023-12-04 ENCOUNTER — Other Ambulatory Visit: Payer: Self-pay | Admitting: Internal Medicine

## 2023-12-04 DIAGNOSIS — Z952 Presence of prosthetic heart valve: Secondary | ICD-10-CM

## 2023-12-04 DIAGNOSIS — I459 Conduction disorder, unspecified: Secondary | ICD-10-CM

## 2023-12-04 DIAGNOSIS — R0602 Shortness of breath: Secondary | ICD-10-CM

## 2023-12-04 DIAGNOSIS — R002 Palpitations: Secondary | ICD-10-CM

## 2023-12-04 NOTE — Telephone Encounter (Signed)
 Detailed instructions left on the patient's answering machine. S.Jassiel Flye CCT

## 2023-12-05 ENCOUNTER — Encounter (HOSPITAL_COMMUNITY)

## 2023-12-05 ENCOUNTER — Other Ambulatory Visit: Payer: Self-pay

## 2023-12-05 ENCOUNTER — Ambulatory Visit (HOSPITAL_BASED_OUTPATIENT_CLINIC_OR_DEPARTMENT_OTHER)
Admission: RE | Admit: 2023-12-05 | Discharge: 2023-12-05 | Disposition: A | Discharge status: Home / Self Care | Source: Ambulatory Visit | Attending: Cardiology | Admitting: Cardiology

## 2023-12-05 ENCOUNTER — Ambulatory Visit (HOSPITAL_COMMUNITY)
Admission: RE | Admit: 2023-12-05 | Discharge: 2023-12-05 | Disposition: A | Discharge status: Home / Self Care | Source: Ambulatory Visit | Attending: Cardiology | Admitting: Cardiology

## 2023-12-05 DIAGNOSIS — R918 Other nonspecific abnormal finding of lung field: Secondary | ICD-10-CM | POA: Diagnosis not present

## 2023-12-05 DIAGNOSIS — R002 Palpitations: Secondary | ICD-10-CM | POA: Insufficient documentation

## 2023-12-05 DIAGNOSIS — Z952 Presence of prosthetic heart valve: Secondary | ICD-10-CM

## 2023-12-05 DIAGNOSIS — R0602 Shortness of breath: Secondary | ICD-10-CM | POA: Insufficient documentation

## 2023-12-05 DIAGNOSIS — Z951 Presence of aortocoronary bypass graft: Secondary | ICD-10-CM | POA: Diagnosis not present

## 2023-12-05 DIAGNOSIS — I459 Conduction disorder, unspecified: Secondary | ICD-10-CM | POA: Insufficient documentation

## 2023-12-05 DIAGNOSIS — J90 Pleural effusion, not elsewhere classified: Secondary | ICD-10-CM | POA: Diagnosis not present

## 2023-12-05 LAB — ECHOCARDIOGRAM COMPLETE
AV Mean grad: 12 mmHg
AV Peak grad: 23.8 mmHg
Ao pk vel: 2.44 m/s
Calc EF: 41.4 %
Height: 66 in
MV M vel: 4.33 m/s
MV Peak grad: 75 mmHg
S' Lateral: 3.9 cm
Single Plane A2C EF: 35.9 %
Single Plane A4C EF: 45 %
Weight: 3072 [oz_av]

## 2023-12-05 LAB — MYOCARDIAL PERFUSION IMAGING
Base ST Depression (mm): 0 mm
LV dias vol: 176 mL (ref 62–150)
LV sys vol: 78 mL (ref 4.2–5.8)
Nuc Stress EF: 56 %
Rest Nuclear Isotope Dose: 10.6 mCi
SDS: 0
SRS: 7
SSS: 5
ST Depression (mm): 0 mm
Stress Nuclear Isotope Dose: 32.4 mCi
TID: 1.14

## 2023-12-05 MED ORDER — TECHNETIUM TC 99M TETROFOSMIN IV KIT
10.6000 | PACK | Freq: Once | INTRAVENOUS | Status: AC | PRN
  Administered 2023-12-05: 10.6 via INTRAVENOUS

## 2023-12-05 MED ORDER — REGADENOSON 0.4 MG/5ML IV SOLN
0.4000 mg | Freq: Once | INTRAVENOUS | Status: AC
  Administered 2023-12-05: 0.4 mg via INTRAVENOUS

## 2023-12-05 MED ORDER — REGADENOSON 0.4 MG/5ML IV SOLN
INTRAVENOUS | Status: AC
  Filled 2023-12-05: qty 5

## 2023-12-05 MED ORDER — TECHNETIUM TC 99M TETROFOSMIN IV KIT
32.4000 | PACK | Freq: Once | INTRAVENOUS | Status: AC | PRN
  Administered 2023-12-05: 32.4 via INTRAVENOUS

## 2023-12-05 MED ORDER — PERFLUTREN LIPID MICROSPHERE
1.0000 mL | INTRAVENOUS | Status: AC | PRN
  Administered 2023-12-05: 2 mL via INTRAVENOUS

## 2023-12-06 ENCOUNTER — Telehealth: Payer: Self-pay | Admitting: Internal Medicine

## 2023-12-06 MED ORDER — FUROSEMIDE 20 MG PO TABS: 20.0000 mg | ORAL_TABLET | Freq: Every day | ORAL | 3 refills | Status: DC

## 2023-12-06 NOTE — Addendum Note (Signed)
 Addended by: RANDY HAMP SAILOR on: 12/06/2023 04:34 PM   Modules accepted: Orders

## 2023-12-06 NOTE — Telephone Encounter (Signed)
 Called pt advised furosemide  (Lasix ) 20 mg PO every day to pharmacy.  Scheduled OV for 12/20/23 at 11:40 am with MD.  Pt will have labs drawn at this visit. Pt had no questions or concerns.

## 2023-12-06 NOTE — Telephone Encounter (Signed)
 Called Patient - new SOB and fatigue; discreet change 3 weeks ago no inciting events - at cardiac rehab graduation was NYHA I - low heart rates- he is in the process of transferring his care to [facility] - has new bilateral pleural effusions - had emergent SVG-RCA as complicated of his AVR; in my view mild RV dilation with inferior hypokinesis on echo; echo reports change in LVEF - RCA infarct reported on NM Stress - this could represent SVG-RCA failure and concurrent drop in LVEF  I recommend - lasix  20 mg PO daily, did teaching on this medication - follow up with me or my team at next available; we discussed LHC/RHC - BMP at that visit; potential RCA territory intervention based on anatomy  Johnathan Leavens, MD FASE [facility] Cardiologist [facility]  [address] [place], [place] [address] [phone]  11:48 AM

## 2023-12-12 ENCOUNTER — Encounter: Payer: Self-pay | Admitting: Internal Medicine

## 2023-12-20 ENCOUNTER — Ambulatory Visit: Attending: Internal Medicine | Admitting: Internal Medicine

## 2023-12-20 ENCOUNTER — Encounter: Payer: Self-pay | Admitting: Internal Medicine

## 2023-12-20 ENCOUNTER — Other Ambulatory Visit: Payer: Self-pay | Admitting: *Deleted

## 2023-12-20 VITALS — BP 131/82 | HR 50 | Ht 66.0 in | Wt 185.8 lb

## 2023-12-20 DIAGNOSIS — I10 Essential (primary) hypertension: Secondary | ICD-10-CM | POA: Diagnosis not present

## 2023-12-20 DIAGNOSIS — Z952 Presence of prosthetic heart valve: Secondary | ICD-10-CM | POA: Insufficient documentation

## 2023-12-20 DIAGNOSIS — Z01812 Encounter for preprocedural laboratory examination: Secondary | ICD-10-CM | POA: Insufficient documentation

## 2023-12-20 DIAGNOSIS — R002 Palpitations: Secondary | ICD-10-CM | POA: Insufficient documentation

## 2023-12-20 NOTE — H&P (View-Only) (Signed)
 Cardiology Office Note:  .    Date:  12/20/2023  ID:  Johnathan Anderson, DOB 01/25/1966, MRN [id number] PCP: Leonel Cole, MD   [facility] Providers Cardiologist:  Stanly DELENA Leavens, MD     CC: Follow up Stress testing  History of Present Illness: .    Johnathan Anderson is a 58 y.o. male  with a history of a bicuspid aortic valve who presents for post-hospital follow-up after experiencing increased shortness of breath and chest heaviness. He was referred by Dr. Leonel for follow-up after treatment for pneumonia and evaluation of his cardiac condition.  In interval his LVEF is down and he has new ischemic changes.  He experiences shortness of breath and mild chest discomfort, with the shortness of breath being the most significant symptom. The chest discomfort is noticeable but not severe. No severe chest pain is reported.  He has a history of coronary artery disease and has undergone previous heart catheterization procedures. He has two grafts, one on the front and one on the back of his heart. A recent stress test showed a drop in ejection fraction and ischemic tissue changes, particularly in the area supplied by the saphenous vein graft to the right coronary artery.  His past medical history includes aortic valve disease, specifically a bicuspid valve with severe aortic stenosis and a thoracic aortic aneurysm, for which he underwent planned surgery. He experienced complications during this period, including cardiac arrest and required support at [facility].  He is currently experiencing stable symptoms, allowing for a planned procedure after August 1st, when his granddaughter will no longer be staying with him.  Relevant histories: .  Social  2022: imaging with stable valve disease and aortic size.   2023: Moderate to severe aortic stenosis. 2024 further increase in his aortic gradients.  Underwent AVR with cardiac arrest and required CABG and transfer to [facility].  Had  plastic and vascular f/u 2025: On DOAC for LA thrombus ROS: As per HPI.   Studies Reviewed: .   Cardiac Studies & Procedures   ______________________________________________________________________________________________ CARDIAC CATHETERIZATION  CARDIAC CATHETERIZATION 12/28/2022  Conclusion Normal coronary anatomy Mildly elevated LV filling pressures. PCWP 21/20 with mean 19 mm Hg. LVEDP 23 mm Hg Mild pulmonary HTN. PAP 45/17 with mean 26 mm Hg Moderate Aortic stenosis by cath. Mean AV gradient 35 mm Hg with AVA 1.38 cm squared. Index 0.69. Cath data may underestimate severity of stenosis. Normal cardiac output 6.74 L/min, index 3.36  Plan: surgical consultation  Findings Coronary Findings Diagnostic  Dominance: Right  Left Main Vessel was injected. Vessel is normal in caliber. Vessel is angiographically normal.  Left Anterior Descending Vessel was injected. Vessel is normal in caliber. Vessel is angiographically normal.  Left Circumflex Vessel was injected. Vessel is normal in caliber. Vessel is angiographically normal.  Right Coronary Artery Vessel was injected. Vessel is normal in caliber. Vessel is angiographically normal.  Intervention  No interventions have been documented.   STRESS TESTS  MYOCARDIAL PERFUSION IMAGING 12/05/2023  Interpretation Summary   Findings are consistent with infarction. The study is intermediate risk.   No ST deviation was noted.   LV perfusion is abnormal. There is no evidence of ischemia. There is evidence of infarction. Defect 1: There is a large defect with severe reduction in uptake present in the mid to basal inferior location(s) that is fixed. There is abnormal wall motion in the defect area. Consistent with infarction.   Left ventricular function is normal. Nuclear stress EF: 56%. The left  ventricular ejection fraction is normal (55-65%). End diastolic cavity size is normal. End systolic cavity size is normal.   CT images were  obtained for attenuation correction and were examined for the presence of coronary calcium  when appropriate.   Coronary calcium  was absent on the attenuation correction CT images. Aortic atherosclerosis noted, AVR noted.   Prior study not available for comparison.   ECHOCARDIOGRAM  ECHOCARDIOGRAM COMPLETE 12/05/2023  Narrative ECHOCARDIOGRAM REPORT    [name] Date of Exam: 12/05/2023 Medical Rec #:  [id number]      Height:       66.0 in Accession #:    [id number]     Weight:       192.0 lb Date of Birth:  [birth year]       BSA:          1.965 m Patient Age:    58 years       BP:           130/85 mmHg Patient Gender: M              HR:           51 bpm. Exam Location:  [address]  Procedure: 2D Echo, Cardiac Doppler, Color Doppler and Intracardiac Opacification Agent (Both Spectral and Color Flow Doppler were utilized during procedure).  Indications:    S/p AVR Z95.2; R53.83 Fatigue; R06.02 SOB  History:        Patient has prior history of Echocardiogram examinations, most recent 05/01/2023. Previous Myocardial Infarction, Prior CABG; Risk Factors:Hypertension and Dyslipidemia. _ Aorta Ascending Aorta Replacement; Date:04/26/2023. Aortic Valve: 23 mm Konect Resilia Conduit valve is present in the aortic position. Procedure Date: 04/26/2023.  Sonographer:    Augustin Seals RDCS Referring Phys: [id number] [facility] A Georgi Tuel  IMPRESSIONS   1. Left ventricular ejection fraction, by estimation, is 45 to 50%. The left ventricle has mildly decreased function. The left ventricle demonstrates global hypokinesis. 2. Right ventricular systolic function is low normal. The right ventricular size is normal. There is severely elevated pulmonary artery systolic pressure. The estimated right ventricular systolic pressure is 61.4 mmHg. 3. Left atrial size was mild to moderately dilated. 4. Right atrial size was mildly dilated. 5. The mitral valve is normal in  structure. Mild mitral valve regurgitation. 6. The aortic valve has been repaired/replaced. Aortic valve regurgitation is not visualized. There is a 23 mm Konect Resilia Conduit valve present in the aortic position. Procedure Date: 04/26/2023. Aortic valve mean gradient measures 12.0 mmHg. Aortic valve Vmax measures 2.44 m/s. No PVL. 7. A small pericardial effusion is present.  FINDINGS Left Ventricle: Left ventricular ejection fraction, by estimation, is 45 to 50%. The left ventricle has mildly decreased function. The left ventricle demonstrates global hypokinesis. Definity  contrast agent was given IV to delineate the left ventricular endocardial borders. The left ventricular internal cavity size was normal in size. There is no left ventricular hypertrophy.  Right Ventricle: The right ventricular size is normal. No increase in right ventricular wall thickness. Right ventricular systolic function is low normal. There is severely elevated pulmonary artery systolic pressure. The tricuspid regurgitant velocity is 3.82 m/s, and with an assumed right atrial pressure of 3 mmHg, the estimated right ventricular systolic pressure is 61.4 mmHg.  Left Atrium: Left atrial size was mild to moderately dilated.  Right Atrium: Right atrial size was mildly dilated.  Pericardium: A small pericardial effusion is present.  Mitral Valve: The mitral valve is normal in structure. Mild  mitral valve regurgitation.  Tricuspid Valve: The tricuspid valve is normal in structure. Tricuspid valve regurgitation is mild.  Aortic Valve: The aortic valve has been repaired/replaced. Aortic valve regurgitation is not visualized. Aortic valve mean gradient measures 12.0 mmHg. Aortic valve peak gradient measures 23.8 mmHg. There is a 23 mm Konect Resilia Conduit valve present in the aortic position. Procedure Date: 04/26/2023.  Pulmonic Valve: The pulmonic valve was normal in structure. Pulmonic valve regurgitation is  trivial.  Aorta: The aortic root is normal in size and structure.  IAS/Shunts: No atrial level shunt detected by color flow Doppler.   LEFT VENTRICLE PLAX 2D LVIDd:         5.20 cm LVIDs:         3.90 cm LV PW:         0.90 cm LV IVS:        1.00 cm  LV Volumes (MOD) LV vol d, MOD A2C: 91.4 ml LV vol d, MOD A4C: 105.0 ml LV vol s, MOD A2C: 58.6 ml LV vol s, MOD A4C: 57.7 ml LV SV MOD A2C:     32.8 ml LV SV MOD A4C:     105.0 ml LV SV MOD BP:      40.8 ml  RIGHT VENTRICLE            IVC RV Basal diam:  4.20 cm    IVC diam: 1.90 cm RV Mid diam:    3.60 cm RV S prime:     7.49 cm/s TAPSE (M-mode): 1.2 cm  LEFT ATRIUM             Index        RIGHT ATRIUM           Index LA diam:        4.90 cm 2.49 cm/m   RA Area:     20.90 cm LA Vol (A2C):   82.1 ml 41.77 ml/m  RA Volume:   60.30 ml  30.68 ml/m LA Vol (A4C):   85.1 ml 43.30 ml/m LA Biplane Vol: 87.0 ml 44.27 ml/m AORTIC VALVE AV Vmax:           244.00 cm/s AV Vmean:          152.500 cm/s AV VTI:            0.524 m AV Peak Grad:      23.8 mmHg AV Mean Grad:      12.0 mmHg LVOT Vmax:         106.00 cm/s LVOT Vmean:        64.500 cm/s LVOT VTI:          0.217 m LVOT/AV VTI ratio: 0.41  AORTA Ao Root diam: 3.20 cm Ao Asc diam:  3.80 cm  MR Peak grad: 75.0 mmHg   TRICUSPID VALVE MR Mean grad: 54.0 mmHg   TR Peak grad:   58.4 mmHg MR Vmax:      433.00 cm/s TR Vmax:        382.00 cm/s MR Vmean:     348.0 cm/s SHUNTS Systemic VTI: 0.22 m  Aditya Sabharwal Electronically signed by Ria Commander Signature Date/Time: 12/05/2023/7:51:45 PM    Final   TEE  ECHO INTRAOPERATIVE TEE 04/27/2023  Narrative *INTRAOPERATIVE TRANSESOPHAGEAL REPORT *    Patient Name:   GIANLUCAS EVENSON Date of Exam: 04/27/2023 Medical Rec #:  [id number]      Height:       66.0 in Accession #:  [id number]     Weight:       216.9 lb Date of Birth:  June 13, 1965       BSA:          2.07 m Patient Age:    57 years       BP:            118/93 mmHg Patient Gender: M              HR:           89 bpm. Exam Location:  Anesthesiology  Transesophogeal exam was perform intraoperatively during surgical procedure. Patient was closely monitored under general anesthesia during the entirety of examination.  Indications:     S/p aortic valve replacement, s/p VA ECMO cannulation, open chest Performing Phys: [id number] DEWARD KALLMAN  Complications: No known complications during this procedure. PRE-OP FINDINGS Left Ventricle: The left ventricle has mild-moderately reduced systolic function, with an ejection fraction of 40-45%. The cavity size was normal. There is hypokinesis of the anterioseptal, septal, inferoseptal, and inferior walls.    Right Ventricle: The right ventricle has moderately reduced systolic function. The cavity was normal. There is no increase in right ventricular wall thickness.  Left Atrium: Left atrial size was normal in size. No left atrial/left atrial appendage thrombus was detected.  Right Atrium: Right atrial size was normal in size.  Interatrial Septum: No atrial level shunt detected by color flow Doppler.  Pericardium: There is no evidence of pericardial effusion.  Mitral Valve: The mitral valve is normal in structure. Mitral valve regurgitation is mild by color flow Doppler.  Tricuspid Valve: The tricuspid valve was normal in structure. Tricuspid valve regurgitation is mild by color flow Doppler.  Aortic Valve: The aortic valve has been repaired/replaced Aortic valve regurgitation was not visualized by color flow Doppler. There is a 23mm Inspiris valve in the aortic position. The valve appears well seated.    Pulmonic Valve: The pulmonic valve was normal in structure. Pulmonic valve regurgitation is not visualized by color flow Doppler.   Aorta: S/p Aortic root replacement.   Garnette Skillern MD Electronically signed by Garnette Skillern MD Signature Date/Time: 04/27/2023/4:12:48 PM    Final     CT SCANS  CT CORONARY MORPH W/CTA COR W/SCORE 11/30/2020  Addendum 11/30/2020  5:48 PM ADDENDUM REPORT: 11/30/2020 17:46  CLINICAL DATA:  Aortic Stenosis Assessment  EXAM: Cardiac TAVR CT  TECHNIQUE: The patient was scanned on a Siemens Force 192 slice scanner. A 120 kV retrospective scan was triggered in the descending thoracic aorta at 111 HU's. Gantry rotation speed was 270 msecs and collimation was .9 mm. No beta blockade or nitro were given. The 3D data set was reconstructed in 5% intervals of the R-R cycle. Systolic and diastolic phases were analyzed on a dedicated work station using MPR, MIP and VRT modes. The patient received 100 cc of contrast.  FINDINGS: Aortic Valve: Moderately thickened Siever's 1 bicuspid aortic valve with the planimeter valve area is 2.62 cm2  LVOT calcification: None  Annular calcification: Minimal  Aortic Valve Calcium  Score: 420  Prosthetic Valve: NA  Mitral Valve: No mitral annular calcification  Left Ventricular Function: LVEF 60% without wall motion abnormality  Aortic Annulus Measurements  Major annulus diameter: 28 mm  Minor annulus diameter: 21 mm  Annular perimeter: 76 mm  Annular area: 4.43 cm2  Aortic Root Measurements  Sinus of Valsalva perimeter: 93 mm  Sinotubular Junction: 2.5 cm  Ascending Thoracic Aorta: 45 mm  Aortic Arch:  37 mm  Descending Thoracic Aorta: 23 mm  Sinus of Valsalva Measurements:  Right coronary cusp width: 26 mm  Left coronary cusp width: 26 mm  Non coronary cusp width: 29 mm  Coronary Artery Height above Annulus:  Left Main: 11 mm  Right Coronary: 8 mm  Coronary Arteries: No nitroglycerin  given for coronary artery patency assessment  Coronary Artery Calcium  score:  Coronary Calcium  Score:  Left main: 0  Left anterior descending artery: 0.366  Left circumflex artery: 0  Right coronary artery: 0  Total: 0.366  Percentile: 56th for age, sex, and race matched  control.  Optimum Fluoroscopic Angle for Delivery if TAVR were deployed: LAO 2, CAU 2  IMPRESSION: 1. Anatomically mild aortic stenosis of a bicuspid valve. Findings pertinent to TAVR procedure are detailed above, though at this time valve team assessment is deferred.  2. Moderate ascending aortic aneurysm without evidence of concomitant coarctation of the aorta.  3. Patient's total coronary artery calcium  score is 0.366, which is 56th percentile for subjects of the same age, gender, and race based populations.  RECOMMENDATIONS:  Coronary artery calcium  (CAC) score is a strong predictor of incident coronary heart disease (CHD) and provides predictive information beyond traditional risk factors. CAC scoring is reasonable to use in the decision to withhold, postpone, or initiate statin therapy in intermediate-risk or selected borderline-risk asymptomatic adults (age 57-75 years and LDL-C >=70 to <190 mg/dL) who do not have diabetes or established atherosclerotic cardiovascular disease (ASCVD).* In intermediate-risk (10-year ASCVD risk >=7.5% to <20%) adults or selected borderline-risk (10-year ASCVD risk >=5% to <7.5%) adults in whom a CAC score is measured for the purpose of making a treatment decision the following recommendations have been made:  If CAC = 0, it is reasonable to withhold statin therapy and reassess in 5 to 10 years, as long as higher risk conditions are absent (diabetes mellitus, family history of premature CHD in first degree relatives (males <55 years; females <65 years), cigarette smoking, LDL >=190 mg/dL or other independent risk factors).  If CAC is 1 to 99, it is reasonable to initiate statin therapy for patients >=47 years of age.  If CAC is >=100 or >=75th percentile, it is reasonable to initiate statin therapy at any age.  Cardiology referral should be considered for patients with CAC scores >=400 or >=75th percentile.  *2018  AHA/ACC/AACVPR/AAPA/ABC/ACPM/ADA/AGS/APhA/ASPC/NLA/PCNA Guideline on the Management of Blood Cholesterol: A Report of the American College of Cardiology/American Heart Association Task Force on Clinical Practice Guidelines. J Am Coll Cardiol. 2019;73(24):3168-3209.  Bev Drennen   Electronically Signed By: Stanly Leavens MD On: 11/30/2020 17:46  Narrative EXAM: OVER-READ INTERPRETATION  CT CHEST  The following report is an over-read performed by radiologist Dr. JONETTA Faes of [facility] on 11/30/2020. This over-read does not include interpretation of cardiac or coronary anatomy or pathology. The coronary CTA interpretation by the cardiologist is attached.  COMPARISON:  None.  FINDINGS: Vascular: Heart size normal. No pericardial effusion. Fair contrast opacification of central pulmonary arteries without evident filling defect to suggest acute PE. Aortic valve leaflet coarse calcifications. 4.4 cm ascending thoracic aortic aneurysm. No aortic dissection or stenosis. 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  Mediastinum/Nodes: No mass or adenopathy.  Lungs/Pleura: No pleural effusion. No pneumothorax. 3 mm subpleural nodule, superior segment right lower lobe (Im41,Se10) . 3 mm subpleural nodule, lateral basal segment left lower lobe image 55. Lungs otherwise clear.  Upper Abdomen: See separate CTA abdomen from  the same  Musculoskeletal: No chest wall mass or suspicious bone lesions identified.  IMPRESSION: Small bilateral pulmonary nodules less than 4 mm. No follow-up needed if patient is low-risk. Non-contrast chest CT can be considered in 12 months if patient is high-risk. This recommendation follows the consensus statement: Guidelines for Management of Incidental Pulmonary Nodules Detected on CT Images: From the Fleischner Society 2017; Radiology 2017; 284:228-243.  4.4 cm ascending thoracic  aortic aneurysm. Recommend annual imaging followup by CTA or MRA. This recommendation follows 2010 ACCF/AHA/AATS/ACR/ASA/SCA/SCAI/SIR/STS/SVM Guidelines for the Diagnosis and Management of Patients with Thoracic Aortic Disease. Circulation. 2010; 121: z733-z630  Electronically Signed: By: JONETTA Faes M.D. On: 11/30/2020 12:43     ______________________________________________________________________________________________       Physical Exam:    VS:  BP 131/82 (BP Location: Left Arm, Patient Position: Sitting, Cuff Size: Normal)   Pulse (!) 50   Ht 5' 6 (1.676 m)   Wt 185 lb 12.8 oz (84.3 kg)   SpO2 99%   BMI 29.99 kg/m    Wt Readings from Last 3 Encounters:  12/20/23 185 lb 12.8 oz (84.3 kg)  12/05/23 192 lb (87.1 kg)  10/11/23 192 lb (87.1 kg)    Gen: no distress  Neck: No JVD Cardiac: No Rubs or Gallops, crisp heart sounds murmur, RRR +2 radial pulses Respiratory: Clear to auscultation bilaterally, normal effort, normal  respiratory rate GI: Soft, nontender, non-distended  MS: No  edema;  moves all extremities Integument: sternotomy is well healed Neuro:  At time of evaluation, alert and oriented to person/place/time/situation  Psych: Normal affect, patient feels well   ASSESSMENT AND PLAN: .    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 Recent stress test indicates ischemic tissue changes and a drop in ejection fraction, coinciding with the area of the SVG to RCA graft. The primary concern is the potential abnormality of the graft to the right side of the heart, which may be contributing to symptoms of shortness of breath and chest pain. The goal is to determine if there is a structural issue that can be addressed, either with the graft or the native arteries. - Schedule left and right heart catheterization after August 1st to assess graft and native arteries (his  granddaughter is with him)  Risks and benefits of cardiac catheterization have been discussed with the patient.  These include bleeding, infection, kidney damage, stroke, heart attack, death.  The patient understands these risks and is willing to proceed.  Access recommendations: L Radial Procedural considerations SOB > anginal symptoms  HFrEF - Shortness of breath is a significant symptom, potentially related to graft failure and decreased cardiac function. The right side of the heart may be affected, leading to decreased cardiac output and fluid retention. The procedure aims to identify any structural issues that can be corrected to alleviate symptoms. - will start GDMT post LHC/RHC   Bicuspid aortic valve with severe stenosis and thoracic aortic aneurysm Bicuspid aortic valve with severe stenosis and thoracic aortic aneurysm, previously addressed with surgery.  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

## 2023-12-20 NOTE — Patient Instructions (Signed)
 Medication Instructions:  The current medical regimen is effective;  continue present plan and medications.  *If you need a refill on your cardiac medications before your next appointment, please call your pharmacy*  Lab Work: Please return for blood work around July 31 [facility])  If you have labs (blood work) drawn today and your tests are completely normal, you will receive your results only by: MyChart Message (if you have MyChart) OR A paper copy in the mail If you have any lab test that is abnormal or we need to change your treatment, we will call you to review the results.  Testing/Procedures:   [employer] [facility]ARTC[facility] A DEPT OF [place] AT [address] A DEPT OF THE [place] [address] [place] [address] Dept: [phone] Loc: [phone]  Johnathan Anderson  12/20/2023  You are scheduled for a Cardiac Catheterization on Monday, August 4 with Dr. Peter [place]az[place].  1. Please arrive at the [facility] (Main Entrance A) at [facility]: [address] [place], [place] [address] at 5:30 AM (two hours before your procedure to ensure your preparation). Free valet parking service is available.   Special note: Every effort is made to have your procedure done on time. Please understand that emergencies sometimes delay scheduled procedures.  2. Diet: Do not eat or drink anything after midnight prior to your procedure except sips of water  to take medications.  3. Labs: You will need to have blood drawn on Thursday, July 31 at [facility] (1st Floor), [address] [place], [place] [address]. You do not need to be fasting.  4. Medication instructions in preparation for your procedure:  On the morning of your procedure, take your Aspirin  81 mg and any morning medicines NOT listed above.  You may use sips of water .  5. Plan for one night stay--bring personal belongings. 6. Bring a  current list of your medications and current insurance cards. 7. You MUST have a responsible person to drive you home. 8. Someone MUST be with you the first 24 hours after you arrive home or your discharge will be delayed. 9. Please wear clothes that are easy to get on and off and wear slip-on shoes.  Thank you for allowing us  to care for you!   -- [facility]\  Follow-Up: At [facility], you and your health needs are our priority.  As part of our continuing mission to provide you with exceptional heart care, our providers are all part of one team.  This team includes your primary Cardiologist (physician) and Advanced Practice Providers or APPs (Physician Assistants and Nurse Practitioners) who all work together to provide you with the care you need, when you need it.  Your next appointment:   8 week(s)  Provider:   One of our Advanced Practice Providers (APPs): Morse Clause, PA-C  Lamarr Satterfield, NP Miriam Shams, NP  Olivia Pavy, PA-C Josefa Beauvais, NP  Leontine Salen, PA-C Orren Fabry, PA-C  [place], PA-C Ernest Dick, NP  Damien Braver, NP Jon Hails, PA-C  Waddell Donath, PA-C    Dayna Dunn, PA-C  Scott Weaver, PA-C Lum Louis, NP Katlyn West, NP Callie Goodrich, PA-C  Evan Williams, PA-C Sheng Haley, PA-C  Xika Zhao, NP Kathleen Johnson, PA-C       We recommend signing up for the patient portal called MyChart.  Sign up information is provided on  this After Visit Summary.  MyChart is used to connect with patients for Virtual Visits (Telemedicine).  Patients are able to view lab/test results, encounter notes, upcoming appointments, etc.  Non-urgent messages can be sent to your provider as well.   To learn more about what you can do with MyChart, go to [email or web address].

## 2023-12-20 NOTE — Progress Notes (Signed)
 Cardiology Office Note:  .    Date:  12/20/2023  ID:  Johnathan Anderson, DOB 01/25/1966, MRN [id number] PCP: Leonel Cole, MD   [facility]Care Providers Cardiologist:  Stanly DELENA Leavens, MD     CC: Follow up Stress testing  History of Present Illness: .    Johnathan Anderson is a 58 y.o. male  with a history of a bicuspid aortic valve who presents for post-hospital follow-up after experiencing increased shortness of breath and chest heaviness. He was referred by Dr. Leonel for follow-up after treatment for pneumonia and evaluation of his cardiac condition.  In interval his LVEF is down and he has new ischemic changes.  He experiences shortness of breath and mild chest discomfort, with the shortness of breath being the most significant symptom. The chest discomfort is noticeable but not severe. No severe chest pain is reported.  He has a history of coronary artery disease and has undergone previous heart catheterization procedures. He has two grafts, one on the front and one on the back of his heart. A recent stress test showed a drop in ejection fraction and ischemic tissue changes, particularly in the area supplied by the saphenous vein graft to the right coronary artery.  His past medical history includes aortic valve disease, specifically a bicuspid valve with severe aortic stenosis and a thoracic aortic aneurysm, for which he underwent planned surgery. He experienced complications during this period, including cardiac arrest and required support at [facility].  He is currently experiencing stable symptoms, allowing for a planned procedure after August 1st, when his granddaughter will no longer be staying with him.  Relevant histories: .  Social  2022: imaging with stable valve disease and aortic size.   2023: Moderate to severe aortic stenosis. 2024 further increase in his aortic gradients.  Underwent AVR with cardiac arrest and required CABG and transfer to [facility].  Had  plastic and vascular f/u 2025: On DOAC for LA thrombus ROS: As per HPI.   Studies Reviewed: .   Cardiac Studies & Procedures   ______________________________________________________________________________________________ CARDIAC CATHETERIZATION  CARDIAC CATHETERIZATION 12/28/2022  Conclusion Normal coronary anatomy Mildly elevated LV filling pressures. PCWP 21/20 with mean 19 mm Hg. LVEDP 23 mm Hg Mild pulmonary HTN. PAP 45/17 with mean 26 mm Hg Moderate Aortic stenosis by cath. Mean AV gradient 35 mm Hg with AVA 1.38 cm squared. Index 0.69. Cath data may underestimate severity of stenosis. Normal cardiac output 6.74 L/min, index 3.36  Plan: surgical consultation  Findings Coronary Findings Diagnostic  Dominance: Right  Left Main Vessel was injected. Vessel is normal in caliber. Vessel is angiographically normal.  Left Anterior Descending Vessel was injected. Vessel is normal in caliber. Vessel is angiographically normal.  Left Circumflex Vessel was injected. Vessel is normal in caliber. Vessel is angiographically normal.  Right Coronary Artery Vessel was injected. Vessel is normal in caliber. Vessel is angiographically normal.  Intervention  No interventions have been documented.   STRESS TESTS  MYOCARDIAL PERFUSION IMAGING 12/05/2023  Interpretation Summary   Findings are consistent with infarction. The study is intermediate risk.   No ST deviation was noted.   LV perfusion is abnormal. There is no evidence of ischemia. There is evidence of infarction. Defect 1: There is a large defect with severe reduction in uptake present in the mid to basal inferior location(s) that is fixed. There is abnormal wall motion in the defect area. Consistent with infarction.   Left ventricular function is normal. Nuclear stress EF: 56%. The left  ventricular ejection fraction is normal (55-65%). End diastolic cavity size is normal. End systolic cavity size is normal.   CT images were  obtained for attenuation correction and were examined for the presence of coronary calcium  when appropriate.   Coronary calcium  was absent on the attenuation correction CT images. Aortic atherosclerosis noted, AVR noted.   Prior study not available for comparison.   ECHOCARDIOGRAM  ECHOCARDIOGRAM COMPLETE 12/05/2023  Narrative ECHOCARDIOGRAM REPORT    [name] Date of Exam: 12/05/2023 Medical Rec #:  [id number]      Height:       66.0 in Accession #:    [id number]     Weight:       192.0 lb Date of Birth:  [birth year]       BSA:          1.965 m Patient Age:    58 years       BP:           130/85 mmHg Patient Gender: M              HR:           51 bpm. Exam Location:  [address]  Procedure: 2D Echo, Cardiac Doppler, Color Doppler and Intracardiac Opacification Agent (Both Spectral and Color Flow Doppler were utilized during procedure).  Indications:    S/p AVR Z95.2; R53.83 Fatigue; R06.02 SOB  History:        Patient has prior history of Echocardiogram examinations, most recent 05/01/2023. Previous Myocardial Infarction, Prior CABG; Risk Factors:Hypertension and Dyslipidemia. _ Aorta Ascending Aorta Replacement; Date:04/26/2023. Aortic Valve: 23 mm Konect Resilia Conduit valve is present in the aortic position. Procedure Date: 04/26/2023.  Sonographer:    Augustin Seals RDCS Referring Phys: [id number] [facility] A Georgi Tuel  IMPRESSIONS   1. Left ventricular ejection fraction, by estimation, is 45 to 50%. The left ventricle has mildly decreased function. The left ventricle demonstrates global hypokinesis. 2. Right ventricular systolic function is low normal. The right ventricular size is normal. There is severely elevated pulmonary artery systolic pressure. The estimated right ventricular systolic pressure is 61.4 mmHg. 3. Left atrial size was mild to moderately dilated. 4. Right atrial size was mildly dilated. 5. The mitral valve is normal in  structure. Mild mitral valve regurgitation. 6. The aortic valve has been repaired/replaced. Aortic valve regurgitation is not visualized. There is a 23 mm Konect Resilia Conduit valve present in the aortic position. Procedure Date: 04/26/2023. Aortic valve mean gradient measures 12.0 mmHg. Aortic valve Vmax measures 2.44 m/s. No PVL. 7. A small pericardial effusion is present.  FINDINGS Left Ventricle: Left ventricular ejection fraction, by estimation, is 45 to 50%. The left ventricle has mildly decreased function. The left ventricle demonstrates global hypokinesis. Definity  contrast agent was given IV to delineate the left ventricular endocardial borders. The left ventricular internal cavity size was normal in size. There is no left ventricular hypertrophy.  Right Ventricle: The right ventricular size is normal. No increase in right ventricular wall thickness. Right ventricular systolic function is low normal. There is severely elevated pulmonary artery systolic pressure. The tricuspid regurgitant velocity is 3.82 m/s, and with an assumed right atrial pressure of 3 mmHg, the estimated right ventricular systolic pressure is 61.4 mmHg.  Left Atrium: Left atrial size was mild to moderately dilated.  Right Atrium: Right atrial size was mildly dilated.  Pericardium: A small pericardial effusion is present.  Mitral Valve: The mitral valve is normal in structure. Mild  mitral valve regurgitation.  Tricuspid Valve: The tricuspid valve is normal in structure. Tricuspid valve regurgitation is mild.  Aortic Valve: The aortic valve has been repaired/replaced. Aortic valve regurgitation is not visualized. Aortic valve mean gradient measures 12.0 mmHg. Aortic valve peak gradient measures 23.8 mmHg. There is a 23 mm Konect Resilia Conduit valve present in the aortic position. Procedure Date: 04/26/2023.  Pulmonic Valve: The pulmonic valve was normal in structure. Pulmonic valve regurgitation is  trivial.  Aorta: The aortic root is normal in size and structure.  IAS/Shunts: No atrial level shunt detected by color flow Doppler.   LEFT VENTRICLE PLAX 2D LVIDd:         5.20 cm LVIDs:         3.90 cm LV PW:         0.90 cm LV IVS:        1.00 cm  LV Volumes (MOD) LV vol d, MOD A2C: 91.4 ml LV vol d, MOD A4C: 105.0 ml LV vol s, MOD A2C: 58.6 ml LV vol s, MOD A4C: 57.7 ml LV SV MOD A2C:     32.8 ml LV SV MOD A4C:     105.0 ml LV SV MOD BP:      40.8 ml  RIGHT VENTRICLE            IVC RV Basal diam:  4.20 cm    IVC diam: 1.90 cm RV Mid diam:    3.60 cm RV S prime:     7.49 cm/s TAPSE (M-mode): 1.2 cm  LEFT ATRIUM             Index        RIGHT ATRIUM           Index LA diam:        4.90 cm 2.49 cm/m   RA Area:     20.90 cm LA Vol (A2C):   82.1 ml 41.77 ml/m  RA Volume:   60.30 ml  30.68 ml/m LA Vol (A4C):   85.1 ml 43.30 ml/m LA Biplane Vol: 87.0 ml 44.27 ml/m AORTIC VALVE AV Vmax:           244.00 cm/s AV Vmean:          152.500 cm/s AV VTI:            0.524 m AV Peak Grad:      23.8 mmHg AV Mean Grad:      12.0 mmHg LVOT Vmax:         106.00 cm/s LVOT Vmean:        64.500 cm/s LVOT VTI:          0.217 m LVOT/AV VTI ratio: 0.41  AORTA Ao Root diam: 3.20 cm Ao Asc diam:  3.80 cm  MR Peak grad: 75.0 mmHg   TRICUSPID VALVE MR Mean grad: 54.0 mmHg   TR Peak grad:   58.4 mmHg MR Vmax:      433.00 cm/s TR Vmax:        382.00 cm/s MR Vmean:     348.0 cm/s SHUNTS Systemic VTI: 0.22 m  Aditya Sabharwal Electronically signed by Ria Commander Signature Date/Time: 12/05/2023/7:51:45 PM    Final   TEE  ECHO INTRAOPERATIVE TEE 04/27/2023  Narrative *INTRAOPERATIVE TRANSESOPHAGEAL REPORT *    [name] Date of Exam: 04/27/2023 Medical Rec #:  [id number]      Height:       66.0 in Accession #:  [id number]     Weight:       216.9 lb Date of Birth:  June 13, 1965       BSA:          2.07 m Patient Age:    57 years       BP:            118/93 mmHg Patient Gender: M              HR:           89 bpm. Exam Location:  Anesthesiology  Transesophogeal exam was perform intraoperatively during surgical procedure. Patient was closely monitored under general anesthesia during the entirety of examination.  Indications:     S/p aortic valve replacement, s/p VA ECMO cannulation, open chest Performing Phys: [id number] DEWARD KALLMAN  Complications: No known complications during this procedure. PRE-OP FINDINGS Left Ventricle: The left ventricle has mild-moderately reduced systolic function, with an ejection fraction of 40-45%. The cavity size was normal. There is hypokinesis of the anterioseptal, septal, inferoseptal, and inferior walls.    Right Ventricle: The right ventricle has moderately reduced systolic function. The cavity was normal. There is no increase in right ventricular wall thickness.  Left Atrium: Left atrial size was normal in size. No left atrial/left atrial appendage thrombus was detected.  Right Atrium: Right atrial size was normal in size.  Interatrial Septum: No atrial level shunt detected by color flow Doppler.  Pericardium: There is no evidence of pericardial effusion.  Mitral Valve: The mitral valve is normal in structure. Mitral valve regurgitation is mild by color flow Doppler.  Tricuspid Valve: The tricuspid valve was normal in structure. Tricuspid valve regurgitation is mild by color flow Doppler.  Aortic Valve: The aortic valve has been repaired/replaced Aortic valve regurgitation was not visualized by color flow Doppler. There is a 23mm Inspiris valve in the aortic position. The valve appears well seated.    Pulmonic Valve: The pulmonic valve was normal in structure. Pulmonic valve regurgitation is not visualized by color flow Doppler.   Aorta: S/p Aortic root replacement.   Garnette Skillern MD Electronically signed by Garnette Skillern MD Signature Date/Time: 04/27/2023/4:12:48 PM    Final     CT SCANS  CT CORONARY MORPH W/CTA COR W/SCORE 11/30/2020  Addendum 11/30/2020  5:48 PM ADDENDUM REPORT: 11/30/2020 17:46  CLINICAL DATA:  Aortic Stenosis Assessment  EXAM: Cardiac TAVR CT  TECHNIQUE: The patient was scanned on a Siemens Force 192 slice scanner. A 120 kV retrospective scan was triggered in the descending thoracic aorta at 111 HU's. Gantry rotation speed was 270 msecs and collimation was .9 mm. No beta blockade or nitro were given. The 3D data set was reconstructed in 5% intervals of the R-R cycle. Systolic and diastolic phases were analyzed on a dedicated work station using MPR, MIP and VRT modes. The patient received 100 cc of contrast.  FINDINGS: Aortic Valve: Moderately thickened Siever's 1 bicuspid aortic valve with the planimeter valve area is 2.62 cm2  LVOT calcification: None  Annular calcification: Minimal  Aortic Valve Calcium  Score: 420  Prosthetic Valve: NA  Mitral Valve: No mitral annular calcification  Left Ventricular Function: LVEF 60% without wall motion abnormality  Aortic Annulus Measurements  Major annulus diameter: 28 mm  Minor annulus diameter: 21 mm  Annular perimeter: 76 mm  Annular area: 4.43 cm2  Aortic Root Measurements  Sinus of Valsalva perimeter: 93 mm  Sinotubular Junction: 2.5 cm  Ascending Thoracic Aorta: 45 mm  Aortic Arch:  37 mm  Descending Thoracic Aorta: 23 mm  Sinus of Valsalva Measurements:  Right coronary cusp width: 26 mm  Left coronary cusp width: 26 mm  Non coronary cusp width: 29 mm  Coronary Artery Height above Annulus:  Left Main: 11 mm  Right Coronary: 8 mm  Coronary Arteries: No nitroglycerin  given for coronary artery patency assessment  Coronary Artery Calcium  score:  Coronary Calcium  Score:  Left main: 0  Left anterior descending artery: 0.366  Left circumflex artery: 0  Right coronary artery: 0  Total: 0.366  Percentile: 56th for age, sex, and race matched  control.  Optimum Fluoroscopic Angle for Delivery if TAVR were deployed: LAO 2, CAU 2  IMPRESSION: 1. Anatomically mild aortic stenosis of a bicuspid valve. Findings pertinent to TAVR procedure are detailed above, though at this time valve team assessment is deferred.  2. Moderate ascending aortic aneurysm without evidence of concomitant coarctation of the aorta.  3. Patient's total coronary artery calcium  score is 0.366, which is 56th percentile for subjects of the same age, gender, and race based populations.  RECOMMENDATIONS:  Coronary artery calcium  (CAC) score is a strong predictor of incident coronary heart disease (CHD) and provides predictive information beyond traditional risk factors. CAC scoring is reasonable to use in the decision to withhold, postpone, or initiate statin therapy in intermediate-risk or selected borderline-risk asymptomatic adults (age 57-75 years and LDL-C >=70 to <190 mg/dL) who do not have diabetes or established atherosclerotic cardiovascular disease (ASCVD).* In intermediate-risk (10-year ASCVD risk >=7.5% to <20%) adults or selected borderline-risk (10-year ASCVD risk >=5% to <7.5%) adults in whom a CAC score is measured for the purpose of making a treatment decision the following recommendations have been made:  If CAC = 0, it is reasonable to withhold statin therapy and reassess in 5 to 10 years, as long as higher risk conditions are absent (diabetes mellitus, family history of premature CHD in first degree relatives (males <55 years; females <65 years), cigarette smoking, LDL >=190 mg/dL or other independent risk factors).  If CAC is 1 to 99, it is reasonable to initiate statin therapy for patients >=47 years of age.  If CAC is >=100 or >=75th percentile, it is reasonable to initiate statin therapy at any age.  Cardiology referral should be considered for patients with CAC scores >=400 or >=75th percentile.  *2018  AHA/ACC/AACVPR/AAPA/ABC/ACPM/ADA/AGS/APhA/ASPC/NLA/PCNA Guideline on the Management of Blood Cholesterol: A Report of the American College of Cardiology/American Heart Association Task Force on Clinical Practice Guidelines. J Am Coll Cardiol. 2019;73(24):3168-3209.  Bev Drennen   Electronically Signed By: Stanly Leavens MD On: 11/30/2020 17:46  Narrative EXAM: OVER-READ INTERPRETATION  CT CHEST  The following report is an over-read performed by radiologist Dr. JONETTA Faes of [facility] on 11/30/2020. This over-read does not include interpretation of cardiac or coronary anatomy or pathology. The coronary CTA interpretation by the cardiologist is attached.  COMPARISON:  None.  FINDINGS: Vascular: Heart size normal. No pericardial effusion. Fair contrast opacification of central pulmonary arteries without evident filling defect to suggest acute PE. Aortic valve leaflet coarse calcifications. 4.4 cm ascending thoracic aortic aneurysm. No aortic dissection or stenosis. 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  Mediastinum/Nodes: No mass or adenopathy.  Lungs/Pleura: No pleural effusion. No pneumothorax. 3 mm subpleural nodule, superior segment right lower lobe (Im41,Se10) . 3 mm subpleural nodule, lateral basal segment left lower lobe image 55. Lungs otherwise clear.  Upper Abdomen: See separate CTA abdomen from  the same  Musculoskeletal: No chest wall mass or suspicious bone lesions identified.  IMPRESSION: Small bilateral pulmonary nodules less than 4 mm. No follow-up needed if patient is low-risk. Non-contrast chest CT can be considered in 12 months if patient is high-risk. This recommendation follows the consensus statement: Guidelines for Management of Incidental Pulmonary Nodules Detected on CT Images: From the Fleischner Society 2017; Radiology 2017; 284:228-243.  4.4 cm ascending thoracic  aortic aneurysm. Recommend annual imaging followup by CTA or MRA. This recommendation follows 2010 ACCF/AHA/AATS/ACR/ASA/SCA/SCAI/SIR/STS/SVM Guidelines for the Diagnosis and Management of Patients with Thoracic Aortic Disease. Circulation. 2010; 121: z733-z630  Electronically Signed: By: JONETTA Faes M.D. On: 11/30/2020 12:43     ______________________________________________________________________________________________       Physical Exam:    VS:  BP 131/82 (BP Location: Left Arm, Patient Position: Sitting, Cuff Size: Normal)   Pulse (!) 50   Ht 5' 6 (1.676 m)   Wt 185 lb 12.8 oz (84.3 kg)   SpO2 99%   BMI 29.99 kg/m    Wt Readings from Last 3 Encounters:  12/20/23 185 lb 12.8 oz (84.3 kg)  12/05/23 192 lb (87.1 kg)  10/11/23 192 lb (87.1 kg)    Gen: no distress  Neck: No JVD Cardiac: No Rubs or Gallops, crisp heart sounds murmur, RRR +2 radial pulses Respiratory: Clear to auscultation bilaterally, normal effort, normal  respiratory rate GI: Soft, nontender, non-distended  MS: No  edema;  moves all extremities Integument: sternotomy is well healed Neuro:  At time of evaluation, alert and oriented to person/place/time/situation  Psych: Normal affect, patient feels well   ASSESSMENT AND PLAN: .    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 Recent stress test indicates ischemic tissue changes and a drop in ejection fraction, coinciding with the area of the SVG to RCA graft. The primary concern is the potential abnormality of the graft to the right side of the heart, which may be contributing to symptoms of shortness of breath and chest pain. The goal is to determine if there is a structural issue that can be addressed, either with the graft or the native arteries. - Schedule left and right heart catheterization after August 1st to assess graft and native arteries (his  granddaughter is with him)  Risks and benefits of cardiac catheterization have been discussed with the patient.  These include bleeding, infection, kidney damage, stroke, heart attack, death.  The patient understands these risks and is willing to proceed.  Access recommendations: L Radial Procedural considerations SOB > anginal symptoms  HFrEF - Shortness of breath is a significant symptom, potentially related to graft failure and decreased cardiac function. The right side of the heart may be affected, leading to decreased cardiac output and fluid retention. The procedure aims to identify any structural issues that can be corrected to alleviate symptoms. - will start GDMT post LHC/RHC   Bicuspid aortic valve with severe stenosis and thoracic aortic aneurysm Bicuspid aortic valve with severe stenosis and thoracic aortic aneurysm, previously addressed with surgery.  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

## 2023-12-28 ENCOUNTER — Ambulatory Visit: Admitting: Cardiovascular Disease

## 2024-01-03 ENCOUNTER — Ambulatory Visit: Payer: Self-pay | Admitting: Internal Medicine

## 2024-01-03 LAB — CBC
Hematocrit: 43.3 % (ref 37.5–51.0)
Hemoglobin: 13.4 g/dL (ref 13.0–17.7)
MCH: 26.8 pg (ref 26.6–33.0)
MCHC: 30.9 g/dL — ABNORMAL LOW (ref 31.5–35.7)
MCV: 87 fL (ref 79–97)
Platelets: 124 x10E3/uL — ABNORMAL LOW (ref 150–450)
RBC: 5 x10E6/uL (ref 4.14–5.80)
RDW: 14.4 % (ref 11.6–15.4)
WBC: 8.9 x10E3/uL (ref 3.4–10.8)

## 2024-01-03 LAB — BASIC METABOLIC PANEL WITH GFR
BUN/Creatinine Ratio: 20 (ref 10–24)
BUN: 25 mg/dL (ref 8–27)
CO2: 21 mmol/L (ref 20–29)
Calcium: 10.1 mg/dL (ref 8.6–10.2)
Chloride: 106 mmol/L (ref 96–106)
Creatinine, Ser: 1.22 mg/dL (ref 0.76–1.27)
Glucose: 83 mg/dL (ref 70–99)
Potassium: 4.4 mmol/L (ref 3.5–5.2)
Sodium: 138 mmol/L (ref 134–144)
eGFR: 68 mL/min/1.73 (ref >59)

## 2024-01-04 ENCOUNTER — Telehealth: Payer: Self-pay | Admitting: *Deleted

## 2024-01-04 NOTE — Telephone Encounter (Signed)
 Cardiac Catheterization scheduled at [facility] for: Monday January 08, 2024 7:30 AM Arrival time [facility] Main Entrance A at: 5:30 AM  Diet: -Nothing to eat after midnight prior to procedure.  Hydration: -May drink clear liquids until leaving for hospital. Approved liquids: Water , clear juice, clear tea, black coffee, fruit juices-non-citric and without pulp, carbonated beverages, Gatorade.  Drink 16 oz. bottle of water  on the way to the hospital.  Medication instructions: -Hold:  Lasix -AM of procedure  -Other usual morning medications can be taken including aspirin  81 mg.  Plan to go home the same day, you will only stay overnight if medically necessary.  You must have responsible adult to drive you home.  Someone must be with you the first 24 hours after you arrive home.  Reviewed procedure instructions with patient.

## 2024-01-04 NOTE — Addendum Note (Signed)
 Addended by: SANTO KELLY A on: 01/04/2024 10:10 AM   Modules accepted: Orders

## 2024-01-08 ENCOUNTER — Other Ambulatory Visit: Payer: Self-pay

## 2024-01-08 ENCOUNTER — Encounter (HOSPITAL_COMMUNITY)
Admission: RE | Disposition: A | Discharge status: Home / Self Care | Payer: Self-pay | Source: Home / Self Care | Attending: Internal Medicine

## 2024-01-08 ENCOUNTER — Other Ambulatory Visit (HOSPITAL_COMMUNITY): Payer: Self-pay

## 2024-01-08 ENCOUNTER — Inpatient Hospital Stay (HOSPITAL_COMMUNITY)

## 2024-01-08 ENCOUNTER — Inpatient Hospital Stay (HOSPITAL_COMMUNITY)
Admission: RE | Admit: 2024-01-08 | Discharge: 2024-01-10 | DRG: 286 | Disposition: A | Discharge status: Home / Self Care | Attending: Internal Medicine | Admitting: Internal Medicine

## 2024-01-08 ENCOUNTER — Encounter (HOSPITAL_COMMUNITY): Payer: Self-pay | Admitting: Cardiology

## 2024-01-08 ENCOUNTER — Telehealth (HOSPITAL_COMMUNITY): Payer: Self-pay | Admitting: Pharmacy Technician

## 2024-01-08 DIAGNOSIS — Z7951 Long term (current) use of inhaled steroids: Secondary | ICD-10-CM

## 2024-01-08 DIAGNOSIS — I252 Old myocardial infarction: Secondary | ICD-10-CM

## 2024-01-08 DIAGNOSIS — I110 Hypertensive heart disease with heart failure: Secondary | ICD-10-CM | POA: Diagnosis present

## 2024-01-08 DIAGNOSIS — Z5986 Financial insecurity: Secondary | ICD-10-CM | POA: Diagnosis not present

## 2024-01-08 DIAGNOSIS — I7121 Aneurysm of the ascending aorta, without rupture: Secondary | ICD-10-CM | POA: Diagnosis present

## 2024-01-08 DIAGNOSIS — Z950 Presence of cardiac pacemaker: Secondary | ICD-10-CM | POA: Diagnosis not present

## 2024-01-08 DIAGNOSIS — I5043 Acute on chronic combined systolic (congestive) and diastolic (congestive) heart failure: Secondary | ICD-10-CM | POA: Diagnosis present

## 2024-01-08 DIAGNOSIS — Z85828 Personal history of other malignant neoplasm of skin: Secondary | ICD-10-CM

## 2024-01-08 DIAGNOSIS — E785 Hyperlipidemia, unspecified: Secondary | ICD-10-CM | POA: Diagnosis present

## 2024-01-08 DIAGNOSIS — K219 Gastro-esophageal reflux disease without esophagitis: Secondary | ICD-10-CM | POA: Diagnosis present

## 2024-01-08 DIAGNOSIS — Z881 Allergy status to other antibiotic agents status: Secondary | ICD-10-CM

## 2024-01-08 DIAGNOSIS — Z7982 Long term (current) use of aspirin: Secondary | ICD-10-CM

## 2024-01-08 DIAGNOSIS — I2510 Atherosclerotic heart disease of native coronary artery without angina pectoris: Secondary | ICD-10-CM | POA: Diagnosis present

## 2024-01-08 DIAGNOSIS — Z951 Presence of aortocoronary bypass graft: Secondary | ICD-10-CM

## 2024-01-08 DIAGNOSIS — I5023 Acute on chronic systolic (congestive) heart failure: Secondary | ICD-10-CM

## 2024-01-08 DIAGNOSIS — Z87891 Personal history of nicotine dependence: Secondary | ICD-10-CM | POA: Diagnosis not present

## 2024-01-08 DIAGNOSIS — I509 Heart failure, unspecified: Principal | ICD-10-CM

## 2024-01-08 DIAGNOSIS — I5042 Chronic combined systolic (congestive) and diastolic (congestive) heart failure: Secondary | ICD-10-CM | POA: Diagnosis not present

## 2024-01-08 DIAGNOSIS — Z8249 Family history of ischemic heart disease and other diseases of the circulatory system: Secondary | ICD-10-CM

## 2024-01-08 DIAGNOSIS — Z833 Family history of diabetes mellitus: Secondary | ICD-10-CM

## 2024-01-08 DIAGNOSIS — I5041 Acute combined systolic (congestive) and diastolic (congestive) heart failure: Secondary | ICD-10-CM | POA: Diagnosis not present

## 2024-01-08 DIAGNOSIS — I739 Peripheral vascular disease, unspecified: Secondary | ICD-10-CM | POA: Diagnosis present

## 2024-01-08 DIAGNOSIS — I5082 Biventricular heart failure: Secondary | ICD-10-CM | POA: Diagnosis present

## 2024-01-08 DIAGNOSIS — Z952 Presence of prosthetic heart valve: Secondary | ICD-10-CM

## 2024-01-08 DIAGNOSIS — I272 Pulmonary hypertension, unspecified: Secondary | ICD-10-CM | POA: Diagnosis present

## 2024-01-08 DIAGNOSIS — I5021 Acute systolic (congestive) heart failure: Secondary | ICD-10-CM | POA: Diagnosis not present

## 2024-01-08 HISTORY — PX: RIGHT HEART CATH AND CORONARY/GRAFT ANGIOGRAPHY: CATH118265

## 2024-01-08 LAB — CBC
HCT: 38.7 % — ABNORMAL LOW (ref 39.0–52.0)
Hemoglobin: 12.7 g/dL — ABNORMAL LOW (ref 13.0–17.0)
MCH: 26.8 pg (ref 26.0–34.0)
MCHC: 32.8 g/dL (ref 30.0–36.0)
MCV: 81.6 fL (ref 80.0–100.0)
Platelets: 112 K/uL — ABNORMAL LOW (ref 150–400)
RBC: 4.74 MIL/uL (ref 4.22–5.81)
RDW: 15.4 % (ref 11.5–15.5)
WBC: 7.7 K/uL (ref 4.0–10.5)
nRBC: 0 % (ref 0.0–0.2)

## 2024-01-08 LAB — POCT I-STAT EG7
Acid-base deficit: 1 mmol/L (ref 0.0–2.0)
Bicarbonate: 24.6 mmol/L (ref 20.0–28.0)
Calcium, Ion: 1.31 mmol/L (ref 1.15–1.40)
HCT: 38 % — ABNORMAL LOW (ref 39.0–52.0)
Hemoglobin: 12.9 g/dL — ABNORMAL LOW (ref 13.0–17.0)
O2 Saturation: 56 %
Potassium: 4.2 mmol/L (ref 3.5–5.1)
Sodium: 142 mmol/L (ref 135–145)
TCO2: 26 mmol/L (ref 22–32)
pCO2, Ven: 42.4 mmHg — ABNORMAL LOW (ref 44–60)
pH, Ven: 7.373 (ref 7.25–7.43)
pO2, Ven: 30 mmHg — CL (ref 32–45)

## 2024-01-08 LAB — POCT I-STAT 7, (LYTES, BLD GAS, ICA,H+H)
Acid-base deficit: 1 mmol/L (ref 0.0–2.0)
Acid-base deficit: 1 mmol/L (ref 0.0–2.0)
Acid-base deficit: 3 mmol/L — ABNORMAL HIGH (ref 0.0–2.0)
Bicarbonate: 21.5 mmol/L (ref 20.0–28.0)
Bicarbonate: 24.1 mmol/L (ref 20.0–28.0)
Bicarbonate: 24.4 mmol/L (ref 20.0–28.0)
Calcium, Ion: 1.3 mmol/L (ref 1.15–1.40)
Calcium, Ion: 1.32 mmol/L (ref 1.15–1.40)
Calcium, Ion: 1.34 mmol/L (ref 1.15–1.40)
HCT: 37 % — ABNORMAL LOW (ref 39.0–52.0)
HCT: 39 % (ref 39.0–52.0)
HCT: 39 % (ref 39.0–52.0)
Hemoglobin: 12.6 g/dL — ABNORMAL LOW (ref 13.0–17.0)
Hemoglobin: 13.3 g/dL (ref 13.0–17.0)
Hemoglobin: 13.3 g/dL (ref 13.0–17.0)
O2 Saturation: 55 %
O2 Saturation: 62 %
O2 Saturation: 93 %
Potassium: 4.2 mmol/L (ref 3.5–5.1)
Potassium: 4.2 mmol/L (ref 3.5–5.1)
Potassium: 4.3 mmol/L (ref 3.5–5.1)
Sodium: 140 mmol/L (ref 135–145)
Sodium: 141 mmol/L (ref 135–145)
Sodium: 142 mmol/L (ref 135–145)
TCO2: 23 mmol/L (ref 22–32)
TCO2: 25 mmol/L (ref 22–32)
TCO2: 26 mmol/L (ref 22–32)
pCO2 arterial: 35.4 mmHg (ref 32–48)
pCO2 arterial: 42.1 mmHg (ref 32–48)
pCO2 arterial: 42.5 mmHg (ref 32–48)
pH, Arterial: 7.363 (ref 7.35–7.45)
pH, Arterial: 7.371 (ref 7.35–7.45)
pH, Arterial: 7.391 (ref 7.35–7.45)
pO2, Arterial: 30 mmHg — CL (ref 83–108)
pO2, Arterial: 33 mmHg — CL (ref 83–108)
pO2, Arterial: 66 mmHg — ABNORMAL LOW (ref 83–108)

## 2024-01-08 LAB — COMPREHENSIVE METABOLIC PANEL WITH GFR
ALT: 32 U/L (ref 0–44)
AST: 26 U/L (ref 15–41)
Albumin: 3.4 g/dL — ABNORMAL LOW (ref 3.5–5.0)
Alkaline Phosphatase: 69 U/L (ref 38–126)
Anion gap: 9 (ref 5–15)
BUN: 22 mg/dL — ABNORMAL HIGH (ref 6–20)
CO2: 23 mmol/L (ref 22–32)
Calcium: 9.5 mg/dL (ref 8.9–10.3)
Chloride: 108 mmol/L (ref 98–111)
Creatinine, Ser: 1.07 mg/dL (ref 0.61–1.24)
GFR, Estimated: >60 mL/min (ref >60)
Glucose, Bld: 159 mg/dL — ABNORMAL HIGH (ref 70–99)
Potassium: 4 mmol/L (ref 3.5–5.1)
Sodium: 140 mmol/L (ref 135–145)
Total Bilirubin: 1.4 mg/dL — ABNORMAL HIGH (ref 0.0–1.2)
Total Protein: 6.8 g/dL (ref 6.5–8.1)

## 2024-01-08 LAB — SURGICAL PCR SCREEN
MRSA, PCR: NEGATIVE
Staphylococcus aureus: NEGATIVE

## 2024-01-08 LAB — HIV ANTIBODY (ROUTINE TESTING W REFLEX): HIV Screen 4th Generation wRfx: NONREACTIVE

## 2024-01-08 SURGERY — RIGHT HEART CATH AND CORONARY/GRAFT ANGIOGRAPHY
Anesthesia: LOCAL

## 2024-01-08 MED ORDER — CHLORHEXIDINE GLUCONATE CLOTH 2 % EX PADS
6.0000 | MEDICATED_PAD | Freq: Every day | CUTANEOUS | Status: DC
  Administered 2024-01-08 – 2024-01-10 (×3): 6 via TOPICAL

## 2024-01-08 MED ORDER — SODIUM CHLORIDE 0.9% FLUSH: 3.0000 mL | Freq: Two times a day (BID) | INTRAVENOUS | Status: DC

## 2024-01-08 MED ORDER — FUROSEMIDE 10 MG/ML IJ SOLN: 80.0000 mg | Freq: Two times a day (BID) | INTRAMUSCULAR | Status: DC

## 2024-01-08 MED ORDER — MIDAZOLAM HCL 2 MG/2ML IJ SOLN
INTRAMUSCULAR | Status: AC
  Filled 2024-01-08: qty 2

## 2024-01-08 MED ORDER — SODIUM CHLORIDE 0.9% FLUSH
10.0000 mL | Freq: Two times a day (BID) | INTRAVENOUS | Status: DC
  Administered 2024-01-09: 20 mL
  Administered 2024-01-09 – 2024-01-10 (×2): 10 mL

## 2024-01-08 MED ORDER — SODIUM CHLORIDE 0.9% FLUSH: 3.0000 mL | INTRAVENOUS | Status: DC | PRN

## 2024-01-08 MED ORDER — ONDANSETRON HCL 4 MG/2ML IJ SOLN: 4.0000 mg | Freq: Four times a day (QID) | INTRAMUSCULAR | Status: DC | PRN

## 2024-01-08 MED ORDER — ASPIRIN 81 MG PO CHEW
81.0000 mg | CHEWABLE_TABLET | ORAL | Status: AC
  Administered 2024-01-08: 81 mg via ORAL
  Filled 2024-01-08: qty 1

## 2024-01-08 MED ORDER — FREE WATER
250.0000 mL | Freq: Once | Status: AC
  Administered 2024-01-08: 250 mL via ORAL

## 2024-01-08 MED ORDER — FENTANYL CITRATE (PF) 100 MCG/2ML IJ SOLN
INTRAMUSCULAR | Status: AC
  Filled 2024-01-08: qty 2

## 2024-01-08 MED ORDER — FREE WATER: 500.0000 mL | Freq: Once | Status: DC

## 2024-01-08 MED ORDER — SODIUM CHLORIDE 0.9% FLUSH: 10.0000 mL | INTRAVENOUS | Status: DC | PRN

## 2024-01-08 MED ORDER — ACETAMINOPHEN 325 MG PO TABS: 650.0000 mg | ORAL_TABLET | ORAL | Status: DC | PRN

## 2024-01-08 MED ORDER — ORAL CARE MOUTH RINSE: 15.0000 mL | OROMUCOSAL | Status: DC | PRN

## 2024-01-08 MED ORDER — SODIUM CHLORIDE 0.9 % IV SOLN: 250.0000 mL | INTRAVENOUS | Status: DC | PRN

## 2024-01-08 MED ORDER — SODIUM CHLORIDE 0.9% FLUSH
3.0000 mL | Freq: Two times a day (BID) | INTRAVENOUS | Status: DC
  Administered 2024-01-08 (×2): 3 mL via INTRAVENOUS

## 2024-01-08 MED ORDER — FUROSEMIDE 10 MG/ML IJ SOLN
80.0000 mg | Freq: Two times a day (BID) | INTRAMUSCULAR | Status: DC
  Administered 2024-01-08 – 2024-01-09 (×2): 80 mg via INTRAVENOUS
  Filled 2024-01-08 (×2): qty 8

## 2024-01-08 MED ORDER — LIDOCAINE HCL (PF) 1 % IJ SOLN
INTRAMUSCULAR | Status: AC
Start: 2024-01-08 — End: 2024-01-08
  Filled 2024-01-08: qty 30

## 2024-01-08 MED ORDER — VERAPAMIL HCL 2.5 MG/ML IV SOLN
INTRAVENOUS | Status: AC
  Filled 2024-01-08: qty 2

## 2024-01-08 MED ORDER — IOHEXOL 350 MG/ML SOLN
INTRAVENOUS | Status: DC | PRN
  Administered 2024-01-08: 70 mL

## 2024-01-08 MED ORDER — SACUBITRIL-VALSARTAN 24-26 MG PO TABS
1.0000 | ORAL_TABLET | Freq: Two times a day (BID) | ORAL | Status: DC
  Administered 2024-01-08 – 2024-01-10 (×5): 1 via ORAL
  Filled 2024-01-08 (×5): qty 1

## 2024-01-08 MED ORDER — HEPARIN SODIUM (PORCINE) 1000 UNIT/ML IJ SOLN
INTRAMUSCULAR | Status: AC
  Filled 2024-01-08: qty 10

## 2024-01-08 MED ORDER — SODIUM CHLORIDE 0.9 % IV SOLN
250.0000 mL | INTRAVENOUS | Status: DC | PRN
Start: 2024-01-08 — End: 2024-01-09

## 2024-01-08 MED ORDER — LIDOCAINE HCL (PF) 1 % IJ SOLN
INTRAMUSCULAR | Status: DC | PRN
  Administered 2024-01-08: 2 mL via INTRADERMAL
  Administered 2024-01-08: 5 mL via INTRADERMAL
  Administered 2024-01-08: 2 mL via INTRADERMAL

## 2024-01-08 MED ORDER — FENTANYL CITRATE (PF) 100 MCG/2ML IJ SOLN
INTRAMUSCULAR | Status: DC | PRN
  Administered 2024-01-08: 25 ug via INTRAVENOUS

## 2024-01-08 MED ORDER — MIDAZOLAM HCL 2 MG/2ML IJ SOLN
INTRAMUSCULAR | Status: DC | PRN
  Administered 2024-01-08: 1 mg via INTRAVENOUS

## 2024-01-08 MED ORDER — ENOXAPARIN SODIUM 40 MG/0.4ML IJ SOSY
40.0000 mg | PREFILLED_SYRINGE | INTRAMUSCULAR | Status: DC
  Administered 2024-01-09: 40 mg via SUBCUTANEOUS
  Filled 2024-01-08: qty 0.4

## 2024-01-08 MED ORDER — HEPARIN (PORCINE) IN NACL 1000-0.9 UT/500ML-% IV SOLN
INTRAVENOUS | Status: DC | PRN
  Administered 2024-01-08 (×2): 500 mL

## 2024-01-08 SURGICAL SUPPLY — 15 items
CATH BALLN WEDGE 5F 110CM (CATHETERS) IMPLANT
CATH INFINITI 5FR MULTPACK ANG (CATHETERS) IMPLANT
CLOSURE MYNX CONTROL 5F (Vascular Products) IMPLANT
GLIDESHEATH SLEND SS 6F .021 (SHEATH) IMPLANT
GUIDEWIRE .025 260CM (WIRE) IMPLANT
GUIDEWIRE ANGLED .035X150CM (WIRE) IMPLANT
GUIDEWIRE INQWIRE 1.5J.035X260 (WIRE) IMPLANT
KIT MICROPUNCTURE NIT STIFF (SHEATH) IMPLANT
KIT SINGLE USE MANIFOLD (KITS) IMPLANT
PACK CARDIAC CATHETERIZATION (CUSTOM PROCEDURE TRAY) ×1 IMPLANT
SET ATX-X65L (MISCELLANEOUS) IMPLANT
SHEATH GLIDE SLENDER 4/5FR (SHEATH) IMPLANT
SHEATH PINNACLE 5F 10CM (SHEATH) IMPLANT
SHEATH PROBE COVER 6X72 (BAG) IMPLANT
WIRE EMERALD 3MM-J .035X260CM (WIRE) IMPLANT

## 2024-01-08 NOTE — Discharge Instructions (Signed)
 Brachial Site Care   This sheet gives you information about how to care for yourself after your procedure. Your health care provider may also give you more specific instructions. If you have problems or questions, contact your health care provider. What can I expect after the procedure? After the procedure, it is common to have: Bruising and tenderness at the catheter insertion area. Follow these instructions at home:  Insertion site care Follow instructions from your health care provider about how to take care of your insertion site. Make sure you: Wash your hands with soap and water  before you change your bandage (dressing). If soap and water  are not available, use hand sanitizer. Remove your dressing as told by your health care provider. In 24 hours Check your insertion site every day for signs of infection. Check for: Redness, swelling, or pain. Pus or a bad smell. Warmth. You may shower 24-48 hours after the procedure. Do not apply powder or lotion to the site.  Activity For 24 hours after the procedure, or as directed by your health care provider: Do not push or pull heavy objects with the affected arm. Do not drive yourself home from the hospital or clinic. You may drive 24 hours after the procedure unless your health care provider tells you not to. Do not lift anything that is heavier than 10 lb (4.5 kg), or the limit that you are told, until your health care provider says that it is safe.  For 24 hours  Femoral Site Care This sheet gives you information about how to care for yourself after your procedure. Your health care provider may also give you more specific instructions. If you have problems or questions, contact your health care provider. What can I expect after the procedure?  After the procedure, it is common to have: Bruising that usually fades within 1-2 weeks. Tenderness at the site. Follow these instructions at home: Wound care Follow instructions from your  health care provider about how to take care of your insertion site. Make sure you: Wash your hands with soap and water  before you change your bandage (dressing). If soap and water  are not available, use hand sanitizer. Remove your dressing as told by your health care provider. In 24 hours Do not take baths, swim, or use a hot tub until your health care provider approves. You may shower 24-48 hours after the procedure or as told by your health care provider. Gently wash the site with plain soap and water . Pat the area dry with a clean towel. Do not rub the site. This may cause bleeding. Do not apply powder or lotion to the site. Keep the site clean and dry. Check your femoral site every day for signs of infection. Check for: Redness, swelling, or pain. Fluid or blood. Warmth. Pus or a bad smell. Activity For the first 2-3 days after your procedure, or as long as directed: Avoid climbing stairs as much as possible. Do not squat. Do not lift anything that is heavier than 10 lb (4.5 kg), or the limit that you are told, until your health care provider says that it is safe. For 5 days Rest as directed. Avoid sitting for a long time without moving. Get up to take short walks every 1-2 hours. Do not drive for 24 hours if you were given a medicine to help you relax (sedative). General instructions Take over-the-counter and prescription medicines only as told by your health care provider. Keep all follow-up visits as told by your health care  provider. This is important. Contact a health care provider if you have: A fever or chills. You have redness, swelling, or pain around your insertion site. Get help right away if: The catheter insertion area swells very fast. You pass out. You suddenly start to sweat or your skin gets clammy. The catheter insertion area is bleeding, and the bleeding does not stop when you hold steady pressure on the area. The area near or just beyond the catheter insertion  site becomes pale, cool, tingly, or numb. These symptoms may represent a serious problem that is an emergency. Do not wait to see if the symptoms will go away. Get medical help right away. Call your local emergency services (911 in the [place] Do not drive yourself to the hospital. Summary After the procedure, it is common to have bruising that usually fades within 1-2 weeks. Check your femoral site every day for signs of infection. Do not lift anything that is heavier than 10 lb (4.5 kg), or the limit that you are told, until your health care provider says that it is safe. This information is not intended to replace advice given to you by your health care provider. Make sure you discuss any questions you have with your health care provider. Document Revised: 06/05/2017 Document Reviewed: 06/05/2017 Elsevier Patient Education  2020 [employer].Femoral Site Care This sheet gives you information about how to care for yourself after your procedure. Your health care provider may also give you more specific instructions. If you have problems or questions, contact your health care provider. What can I expect after the procedure?  After the procedure, it is common to have: Bruising that usually fades within 1-2 weeks. Tenderness at the site. Follow these instructions at home: Wound care Follow instructions from your health care provider about how to take care of your insertion site. Make sure you: Wash your hands with soap and water  before you change your bandage (dressing). If soap and water  are not available, use hand sanitizer. Remove your dressing as told by your health care provider. In 24 hours Do not take baths, swim, or use a hot tub until your health care provider approves. You may shower 24-48 hours after the procedure or as told by your health care provider. Gently wash the site with plain soap and water . Pat the area dry with a clean towel. Do not rub the site. This may cause  bleeding. Do not apply powder or lotion to the site. Keep the site clean and dry. Check your femoral site every day for signs of infection. Check for: Redness, swelling, or pain. Fluid or blood. Warmth. Pus or a bad smell. Activity For the first 2-3 days after your procedure, or as long as directed: Avoid climbing stairs as much as possible. Do not squat. Do not lift anything that is heavier than 10 lb (4.5 kg), or the limit that you are told, until your health care provider says that it is safe. For 5 days Rest as directed. Avoid sitting for a long time without moving. Get up to take short walks every 1-2 hours. Do not drive for 24 hours if you were given a medicine to help you relax (sedative). General instructions Take over-the-counter and prescription medicines only as told by your health care provider. Keep all follow-up visits as told by your health care provider. This is important. Contact a health care provider if you have: A fever or chills. You have redness, swelling, or pain around your insertion site. Get help  right away if: The catheter insertion area swells very fast. You pass out. You suddenly start to sweat or your skin gets clammy. The catheter insertion area is bleeding, and the bleeding does not stop when you hold steady pressure on the area. The area near or just beyond the catheter insertion site becomes pale, cool, tingly, or numb. These symptoms may represent a serious problem that is an emergency. Do not wait to see if the symptoms will go away. Get medical help right away. Call your local emergency services (911 in the [place] Do not drive yourself to the hospital. Summary After the procedure, it is common to have bruising that usually fades within 1-2 weeks. Check your femoral site every day for signs of infection. Do not lift anything that is heavier than 10 lb (4.5 kg), or the limit that you are told, until your health care provider says that it is  safe. This information is not intended to replace advice given to you by your health care provider. Make sure you discuss any questions you have with your health care provider. Document Revised: 06/05/2017 Document Reviewed: 06/05/2017 Elsevier Patient Education  2020 [employer].

## 2024-01-08 NOTE — Interval H&P Note (Signed)
 History and Physical Interval Note:  01/08/2024 7:34 AM  Johnathan Anderson  has presented today for surgery, with the diagnosis of hf   low ef.  The various methods of treatment have been discussed with the patient and family. After consideration of risks, benefits and other options for treatment, the patient has consented to  Procedure(s): RIGHT/LEFT HEART CATH AND CORONARY/GRAFT ANGIOGRAPHY (N/A) as a surgical intervention.  The patient's history has been reviewed, patient examined, no change in status, stable for surgery.  I have reviewed the patient's chart and labs.  Questions were answered to the patient's satisfaction.   Cath Lab Visit (complete for each Cath Lab visit)  Clinical Evaluation Leading to the Procedure:   ACS: No.  Non-ACS:    Anginal Classification: CCS II  Anti-ischemic medical therapy: Minimal Therapy (1 class of medications)  Non-Invasive Test Results: Intermediate-risk stress test findings: cardiac mortality 1-3%/year  Prior CABG: Previous CABG        Maude [facility] 01/08/2024 7:34 AM

## 2024-01-08 NOTE — Progress Notes (Signed)
 Heart Failure Navigator Progress Note  Assessed for Heart & Vascular TOC clinic readiness.  Patient does not meet criteria due to Advanced Heart Failure Team consulted. .   Navigator will sign off at this time.   Rhae Hammock, BSN, Scientist, clinical (histocompatibility and immunogenetics) Only

## 2024-01-08 NOTE — Progress Notes (Signed)
 Went to place the PICC. Patient using urinal every 5 minutes. PICC team will comeback when patient is ready. Primary RN aware.

## 2024-01-08 NOTE — H&P (Addendum)
 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 male with history of GDRD, HTN, HLD, bicuspid aortic valve/ascending aortic aneurysm. He presented for elective AVR with Bentall procedure in 11/24. There was significant difficulty coming off pump w/ concern for coronary ischemia.  Therefore, he underwent SVG to RCA and SVG to LAD. Postop he had significant bleeding, VT arrest and biventricular shock (RV predominant) requiring central VA ECMO cannulation. He was unable to wean from ECMO and was transferred to [facility] for transplant evaluation. He later had cardiac recovery and underwent ECMO decannulation. Had Micra leadless PPM placed prior to discharge d/t intermittent heart block.  Since discharge in 12/24 he has been following with Dr. Leavens. He has been noticing more shortness of breath and chest pain with exertion x 3 months. Echo 12/05/23: EF 45-50%, RV low normal, RVSP 61 mmHg, aortic valve prosthesis okay with mean gradient 12 mmHg. He had an abnormal stress MPI with large fixed mid to basal inferior defect. He was set up for outpatient [facility] today for further assessment.  R/LHC today: Patent native coronaries and bypass grafts, RA mean 18, PA 94/30, PCWP mean 29, TD CI 1.97.  He is being admitted from the cath lab for IV diuresis and medical optimization.  Home Medications Prior to Admission medications   Medication Sig Start Date End Date Taking? Authorizing Provider  albuterol  (VENTOLIN  HFA) 108 (90 Base) MCG/ACT inhaler Inhale 2 puffs into the lungs every 4 (four) hours as needed for wheezing or shortness of breath. 10/01/23  Yes Raford Lenis, MD  aspirin  81 MG chewable tablet Chew 81 mg by mouth at bedtime.   Yes [provider]  budesonide-formoterol (SYMBICORT) 160-4.5 MCG/ACT inhaler Inhale 2 puffs into the lungs 2 (two)  times daily.   Yes [provider]  furosemide  (LASIX ) 20 MG tablet Take 1 tablet (20 mg total) by mouth daily. Patient taking differently: Take 20 mg by mouth daily at 12 noon. 12/06/23  Yes Chandrasekhar, Mahesh A, MD  omeprazole  (PRILOSEC) 40 MG capsule Take 1 capsule (40 mg total) by mouth daily. 10/16/23  Yes Soldatova, Liuba, MD  rosuvastatin  (CRESTOR ) 10 MG tablet Take 10 mg by mouth every evening. 11/23/23  Yes [provider]    Past Medical History: Past Medical History:  Diagnosis Date   Aortic aneurysm (HCC)    Arthritis    Left Knee   Cancer (HCC) 2022   Skin Cancer   GERD (gastroesophageal reflux disease)    Headache    Migraines   Heart murmur    Hyperlipidemia    Hypertension    Peripheral vascular disease (HCC)    Thoracic Aortic Aneurysm   Vertigo     Past Surgical History: Past Surgical History:  Procedure Laterality Date   AORTIC VALVE REPLACEMENT N/A 04/26/2023   Procedure: AORTIC VALVE REPLACEMENT (AVR) USING 23 MM KONECT RESILIA AORTIC VALVED CONDUIT;  Surgeon: Maryjane Mt, MD;  Location: [facility] OR;  Service: Open Heart Surgery;  Laterality: N/A;   APPENDECTOMY     1990s   CANNULATION FOR ECMO (EXTRACORPOREAL MEMBRANE OXYGENATION) N/A 04/26/2023   Procedure: CENTRAL CANNULATION FOR ECMO (EXTRACORPOREAL MEMBRANE OXYGENATION);  Surgeon: Maryjane Mt, MD;  Location: [facility] OR;  Service: Open Heart Surgery;  Laterality: N/A;   COLONOSCOPY WITH ESOPHAGOGASTRODUODENOSCOPY (EGD)     2020s  CORONARY ARTERY BYPASS GRAFT N/A 04/26/2023   Procedure: CORONARY ARTERY BYPASS GRAFTING (CABG) x 2 USING RIGHT AND LEFT GREATER SAPHENOUS VEIN HARVESTED OPEN;  Surgeon: Maryjane Mt, MD;  Location: [facility] OR;  Service: Open Heart Surgery;  Laterality: N/A;   CORONARY ARTERY BYPASS GRAFT     EXPLORATION POST OPERATIVE OPEN HEART N/A 04/26/2023   Procedure: EXPLORATION POST OPERATIVE OPEN HEART;  Surgeon: Maryjane Mt, MD;  Location[facility] OR;  Service: Open Heart  Surgery;  Laterality: N/A;   EXPLORATION POST OPERATIVE OPEN HEART N/A 04/27/2023   Procedure: EXPLORATION POST OPERATIVE OPEN HEART;  Surgeon: Maryjane Mt, MD;  Location: [facility] OR;  Service: Open Heart Surgery;  Laterality: N/A;   EYE SURGERY Right    eye tissue removed   MOUTH SURGERY  2022   Skin Cancer Removal   REPLACEMENT ASCENDING AORTA N/A 04/26/2023   Procedure: REPLACEMENT ASCENDING AORTA USING 23 MM KONECT RESILIA AORTIC VALVED CONDUIT;  Surgeon: Maryjane Mt, MD;  Location: [facility] OR;  Service: Open Heart Surgery;  Laterality: N/A;   RIGHT/LEFT HEART CATH AND CORONARY ANGIOGRAPHY N/A 12/28/2022   Procedure: RIGHT/LEFT HEART CATH AND CORONARY ANGIOGRAPHY;  Surgeon: Swaziland, Peter M, MD;  Location: [facility] INVASIVE CV LAB;  Service: Cardiovascular;  Laterality: N/A;   STERIOD INJECTION  09/16/2011   Procedure: MINOR STEROID INJECTION;  Surgeon: Lacinda Ozell Mans, MD;  Location: [facility];  Service: Orthopedics;  Laterality: Left;  Left Lumbar Five-Sacral One Transforaminal Epidural Steroid Injection   STERNAL WOUND DEBRIDEMENT N/A 05/01/2023   Procedure: MEDIAL STERNAL WOUND DEBRIDEMENT;  Surgeon: Obadiah Coy, MD;  Location: [facility] OR;  Service: Thoracic;  Laterality: N/A;   TEE WITHOUT CARDIOVERSION N/A 11/05/2020   Procedure: TRANSESOPHAGEAL ECHOCARDIOGRAM (TEE);  Surgeon: Santo Stanly LABOR, MD;  Location: [facility] ENDOSCOPY;  Service: Cardiovascular;  Laterality: N/A;   TEE WITHOUT CARDIOVERSION N/A 04/26/2023   Procedure: TRANSESOPHAGEAL ECHOCARDIOGRAM;  Surgeon: Maryjane Mt, MD;  Location: [facility] OR;  Service: Open Heart Surgery;  Laterality: N/A;   TEE WITHOUT CARDIOVERSION N/A 04/27/2023   Procedure: TRANSESOPHAGEAL ECHOCARDIOGRAM (TEE);  Surgeon: Maryjane Mt, MD;  Location: [facility] OR;  Service: Open Heart Surgery;  Laterality: N/A;   TEE WITHOUT CARDIOVERSION N/A 05/01/2023   Procedure: TRANSESOPHAGEAL ECHOCARDIOGRAM (TEE);  Surgeon: Obadiah Coy, MD;  Location: [facility] OR;   Service: Thoracic;  Laterality: N/A;    Family History:  Family History  Problem Relation Age of Onset   Hypertension Mother    Diabetes Father    Migraines Neg Hx     Social History: Social History   Socioeconomic History   Marital status: Married    Spouse name: Not on file   Number of children: Not on file   Years of education: Not on file   Highest education level: Not on file  Occupational History   Not on file  Tobacco Use   Smoking status: Former    Types: Cigarettes   Smokeless tobacco: Never   Tobacco comments:    Pt quit smoking cigarettes in 2005. 1 pack of cigarettes per week.  Vaping Use   Vaping status: Never Used  Substance and Sexual Activity   Alcohol use: No   Drug use: No   Sexual activity: Yes  Other Topics Concern   Not on file  Social History Narrative   Not on file   Social Drivers of Health   Financial Resource Strain: Medium Risk (08/12/2022)   Overall Financial Resource Strain (CARDIA)    Difficulty of Paying Living Expenses:  Somewhat hard  Food Insecurity: No Food Insecurity (04/27/2023)   Hunger Vital Sign    Worried About Running Out of Food in the Last Year: Never true    Ran Out of Food in the Last Year: Never true  Transportation Needs: No Transportation Needs (05/02/2023)   Received from [facility] - Transportation    In the past 12 months, has lack of transportation kept you from medical appointments or from getting medications?: No    Lack of Transportation (Non-Medical): No  Physical Activity: Not on file  Stress: Not on file  Social Connections: Not on file    Allergies:  Allergies  Allergen Reactions   Levofloxacin Other (See Comments)    unknown  unsure-cardiologist stated allergy    Objective:    Vital Signs:   Temp:  [97.7 F (36.5 C)] 97.7 F (36.5 C) (08/04 0544) Pulse Rate:  [49-123] 50 (08/04 1200) Resp:  [13-27] 17 (08/04 1126) BP: (145-189)/(82-101) 147/92 (08/04  1200) SpO2:  [94 %-98 %] 96 % (08/04 1200) Weight:  [83.5 kg] 83.5 kg (08/04 0544)   Filed Weights   01/08/24 0544  Weight: 83.5 kg     Physical Exam     General:  Well appearing. No respiratory difficulty Neck: JVP to ear Cor: Regular rate & rhythm. No murmurs. Lungs: Breathing nonlabored Abdomen: Soft, nontender, nondistended.  Extremities: No edema Neuro: Alert & oriented x 3. Affect pleasant   Labs     Basic Metabolic Panel: Recent Labs  Lab 01/02/24 1229 01/08/24 1102 01/08/24 1116 01/08/24 1122  NA 138 142  141 142 140  K 4.4 4.3  4.2 4.2 4.2  CL 106  --   --   --   CO2 21  --   --   --   GLUCOSE 83  --   --   --   BUN 25  --   --   --   CREATININE 1.22  --   --   --   CALCIUM  10.1  --   --   --     Liver Function Tests: No results for input(s): AST, ALT, ALKPHOS, BILITOT, PROT, ALBUMIN  in the last 168 hours. No results for input(s): LIPASE, AMYLASE in the last 168 hours. No results for input(s): AMMONIA in the last 168 hours.  CBC: Recent Labs  Lab 01/02/24 1229 01/08/24 1102 01/08/24 1116 01/08/24 1122  WBC 8.9  --   --   --   HGB 13.4 13.3  13.3 12.9* 12.6*  HCT 43.3 39.0  39.0 38.0* 37.0*  MCV 87  --   --   --   PLT 124*  --   --   --     Cardiac Enzymes: No results for input(s): CKTOTAL, CKMB, CKMBINDEX, TROPONINI in the last 168 hours.  BNP: BNP (last 3 results) Recent Labs    06/06/23 1125  BNP 195.6*    ProBNP (last 3 results) Recent Labs    09/30/23 2102  PROBNP 1,247.0*     CBG: No results for input(s): GLUCAP in the last 168 hours.  Coagulation Studies: No results for input(s): LABPROT, INR in the last 72 hours.  Imaging: US  EKG SITE RITE Result Date: 01/08/2024 If Site Rite image not attached, placement could not be confirmed due to current cardiac rhythm.  CARDIAC CATHETERIZATION Result Date: 01/08/2024   Ost LM to Mid LM lesion is 25% stenosed.   SVG graft was visualized by  angiography and  is large.   SVG graft was visualized by angiography and is normal in caliber.   The graft exhibits no disease.   The graft exhibits no disease.   Hemodynamic findings consistent with severe pulmonary hypertension. Patent native coronary arteries Patent SVG to RCA Patent SVG to the LAD (off the SVG to RCA Moderately elevated LV filling pressures PCWP 30/29, mean 29 mm Hg Severe pulmonary HTN PAP 94/25, mean 49 mm Hg. Elevated RA pressure 24/21, mean 18 mm Hg Low cardiac index 1.97. Impression; patent coronary arteries and grafts. Main issue is biventricular failure. Discussed with Dr Cherrie with Advanced heart failure. Will admit for IV inotropic therapy and optimization of CHF therapy     Patient Profile   58 y.o. male with history of bicuspid aortic valve/ascending aortic aneurysm s/p AVR w/ Bental in 11/24. Surgery c/b difficulty weaning coming off pump/ischemic s/p SVG to RCA and SVG to LAD. Due to acute biventricular heart failure/profound shock he required VA ECMO and was later decannulated after he had cardiac recovery.   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  Assessment/Plan   Acute on chronic HFmrEF -S/p AVR w/ Bentall procedure in 11/24. D/t concern for ischemia/difficulty weaning from bypass also had RSVG from aortic graft to RCA and SVG to LAD. Developed profound shock/BiV failure and required VA ECMO. Transferred to [facility] for transplant evaluation but ultimately had cardiac recovery. -Echo on VA ECMO 11/24: EF < 20%, RV severely HK -Echo at [facility] 12/24: EF > 55%, RV okay, aortic valve prosthesis okay -Echo 12/05/23: EF 45-50%, RV low normal, RVSP 61 mmHg, aortic valve bioprosthesis okay -R/LHC 01/08/24: Native coronaries and bypasses are patent, RA mean 18, PA 94/30, PCWP mean 29, Fick CI 1.97. -NYHA III. Significantly volume overloaded. Start IV lasix  80 BID. -Place PICC for CVP and CO-OX monitoring -BP elevated. Needs afterload reduction. Start Entresto  24/26  mg BID -No beta blocker for now with low-output -SGLT2i next -Repeat echo  2. Bicuspid aortic valve/ascending aortic aneurysm -S/p Bental as above  3. High grade heart block -S/p micra leadless pacemaker at [facility] 12/24 -Followed at [facility] -QRS > 150 ms, 1% V pacing on least device check in June but seems to have higher burden of pacing on telemetry. Wonder if he would eventually benefit from upgrade to CRT  4. HTN -BP elevated -Meds as above   FINCH, LINDSAY N, PA-C 01/08/2024, 1:53 PM  Total Time Spent 45 minutes  Advanced Heart Failure Team Pager [phone] (M-F; 7a - 5p)  Please contact [facility] for night-coverage after hours (4p -7a ) and weekends on [email or web address]  Agree with above 58 y/o male as above with complicated cardiac history with recent AVR/Bentall c/b shock requiring ECMO  Over past few weeks has had progressive HF symptoms. Echo with EF 45-50%   Cath today with intact coronary circulation but severe biventricular HF and PAH  General:  Sitting up in bed No resp difficulty HEENT: normal Neck: supple. JVP to ear. Carotids 2+ bilat; no bruits. No lymphadenopathy or thryomegaly appreciated. Cor: PMI nondisplaced. Regular rate & rhythm. No rubs, gallops or murmurs. Lungs: clear Abdomen: obese soft, nontender, nondistended. No hepatosplenomegaly. No bruits or masses. Good bowel sounds. Extremities: no cyanosis, clubbing, rash, 2+ edema Neuro: alert & orientedx3, cranial nerves grossly intact. moves all 4 extremities w/o difficulty. Affect pleasant  He has severe biventricular HF with marked volume overload. Place PICC. Diurese. Low threshold to add milrinone .  Consider repeat RHC prior to d/c   Toribio  Elowyn Raupp, MD  4:02 PM

## 2024-01-08 NOTE — Progress Notes (Signed)
 Peripherally Inserted Central Catheter Placement  The IV Nurse has discussed with the patient and/or persons authorized to consent for the patient, the purpose of this procedure and the potential benefits and risks involved with this procedure.  The benefits include less needle sticks, lab draws from the catheter, and the patient may be discharged home with the catheter. Risks include, but not limited to, infection, bleeding, blood clot (thrombus formation), and puncture of an artery; nerve damage and irregular heartbeat and possibility to perform a PICC exchange if needed/ordered by physician.  Alternatives to this procedure were also discussed.  Bard Power PICC patient education guide, fact sheet on infection prevention and patient information card has been provided to patient /or left at bedside. Awaiting PCXR for tip confirmation.     PICC Placement Documentation  PICC Triple Lumen 01/08/24 Left Brachial 44 cm 0 cm (Active)  Indication for Insertion or Continuance of Line Vasoactive infusions 01/08/24 2023  Exposed Catheter (cm) 0 cm 01/08/24 2023  Site Assessment Clean, Dry, Intact 01/08/24 2023  Lumen #1 Status Blood return noted;Flushed;Saline locked 01/08/24 2023  Lumen #2 Status Blood return noted;Flushed;Saline locked 01/08/24 2023  Lumen #3 Status Blood return noted;Flushed;Saline locked 01/08/24 2023  Dressing Type Transparent;Securing device 01/08/24 2023  Dressing Status Antimicrobial disc/dressing in place;Clean, Dry, Intact 01/08/24 2023  Line Care Connections checked and tightened 01/08/24 2023  Line Adjustment (NICU/IV Team Only) No 01/08/24 2023  Dressing Intervention New dressing;Adhesive placed at insertion site (IV team only) 01/08/24 2023  Dressing Change Due 01/15/24 01/08/24 2023       Debby Salomon CROME 01/08/2024, 8:38 PM

## 2024-01-08 NOTE — Plan of Care (Signed)

## 2024-01-08 NOTE — Telephone Encounter (Signed)
 Patient Product/process development scientist completed.    The patient is insured through [employer]. Patient has Medicare and is not eligible for a copay card, but may be able to apply for patient assistance or Medicare RX Payment Plan (Patient Must reach out to their plan, if eligible for payment plan), if available.    Ran test claim for Entresto  24-26 mg and the current 30 day co-pay is $165.33 due to a deductible.  Ran test claim for Farxiga  10 mg and the current 30 day co-pay is $140.60 due to a deductible.  Ran test claim for Jardiance 10 mg and the current 30 day co-pay is $147.57 due to a deductible.  This test claim was processed through [facility]- copay amounts may vary at other pharmacies due to pharmacy/plan contracts, or as the patient moves through the different stages of their insurance plan.     Reyes Sharps, CPHT Pharmacy Technician III Certified Patient Advocate [facility] Patient Advocate Team Direct Number: [phone]  Fax: [phone]

## 2024-01-09 ENCOUNTER — Inpatient Hospital Stay (HOSPITAL_COMMUNITY)

## 2024-01-09 ENCOUNTER — Other Ambulatory Visit (HOSPITAL_COMMUNITY): Payer: Self-pay

## 2024-01-09 DIAGNOSIS — I5021 Acute systolic (congestive) heart failure: Secondary | ICD-10-CM | POA: Diagnosis not present

## 2024-01-09 LAB — CBC
HCT: 42.9 % (ref 39.0–52.0)
Hemoglobin: 14.1 g/dL (ref 13.0–17.0)
MCH: 26.7 pg (ref 26.0–34.0)
MCHC: 32.9 g/dL (ref 30.0–36.0)
MCV: 81.1 fL (ref 80.0–100.0)
Platelets: 122 K/uL — ABNORMAL LOW (ref 150–400)
RBC: 5.29 MIL/uL (ref 4.22–5.81)
RDW: 15.7 % — ABNORMAL HIGH (ref 11.5–15.5)
WBC: 9.9 K/uL (ref 4.0–10.5)
nRBC: 0 % (ref 0.0–0.2)

## 2024-01-09 LAB — COOXEMETRY PANEL
Carboxyhemoglobin: 1.5 % (ref 0.5–1.5)
Methemoglobin: <0.7 % (ref 0.0–1.5)
O2 Saturation: 66.7 %
Total hemoglobin: 14.3 g/dL (ref 12.0–16.0)

## 2024-01-09 LAB — BASIC METABOLIC PANEL WITH GFR
Anion gap: 9 (ref 5–15)
BUN: 20 mg/dL (ref 6–20)
CO2: 25 mmol/L (ref 22–32)
Calcium: 9.7 mg/dL (ref 8.9–10.3)
Chloride: 107 mmol/L (ref 98–111)
Creatinine, Ser: 1.22 mg/dL (ref 0.61–1.24)
GFR, Estimated: >60 mL/min (ref >60)
Glucose, Bld: 103 mg/dL — ABNORMAL HIGH (ref 70–99)
Potassium: 3.8 mmol/L (ref 3.5–5.1)
Sodium: 141 mmol/L (ref 135–145)

## 2024-01-09 LAB — ECHOCARDIOGRAM LIMITED
Calc EF: 37.9 %
Height: 66 in
Single Plane A2C EF: 31.4 %
Single Plane A4C EF: 44.4 %
Weight: 2825.42 [oz_av]

## 2024-01-09 LAB — HEMOGLOBIN A1C
Hgb A1c MFr Bld: 6 % — ABNORMAL HIGH (ref 4.8–5.6)
Mean Plasma Glucose: 126 mg/dL

## 2024-01-09 LAB — MAGNESIUM: Magnesium: 2.1 mg/dL (ref 1.7–2.4)

## 2024-01-09 MED ORDER — FREE WATER
250.0000 mL | Freq: Once | Status: AC
  Administered 2024-01-10: 250 mL via ORAL

## 2024-01-09 MED ORDER — PERFLUTREN LIPID MICROSPHERE
1.0000 mL | INTRAVENOUS | Status: AC | PRN
  Administered 2024-01-09: 3 mL via INTRAVENOUS

## 2024-01-09 MED ORDER — SPIRONOLACTONE 12.5 MG HALF TABLET
12.5000 mg | ORAL_TABLET | Freq: Every day | ORAL | Status: DC
  Administered 2024-01-09 – 2024-01-10 (×2): 12.5 mg via ORAL
  Filled 2024-01-09 (×2): qty 1

## 2024-01-09 MED ORDER — ASPIRIN 81 MG PO CHEW
81.0000 mg | CHEWABLE_TABLET | ORAL | Status: AC
  Administered 2024-01-10: 81 mg via ORAL
  Filled 2024-01-09: qty 1

## 2024-01-09 MED ORDER — POTASSIUM CHLORIDE CRYS ER 20 MEQ PO TBCR
40.0000 meq | EXTENDED_RELEASE_TABLET | Freq: Once | ORAL | Status: AC
  Administered 2024-01-09: 40 meq via ORAL
  Filled 2024-01-09: qty 2

## 2024-01-09 MED ORDER — DAPAGLIFLOZIN PROPANEDIOL 10 MG PO TABS
10.0000 mg | ORAL_TABLET | Freq: Every day | ORAL | Status: DC
  Administered 2024-01-09 – 2024-01-10 (×2): 10 mg via ORAL
  Filled 2024-01-09 (×2): qty 1

## 2024-01-09 NOTE — Progress Notes (Signed)
 Echocardiogram 2D Echocardiogram has been performed.  Johnathan Anderson Johnathan Anderson RDCS 01/09/2024, 11:04 AM

## 2024-01-09 NOTE — TOC Initial Note (Signed)
 Transition of Care [facility]) - Initial/Assessment Note    Patient Details  Name: Johnathan Anderson MRN: [id number] Date of Birth: 1966-01-02  Transition of Care [facility]) CM/SW Contact:    Justina Delcia Czar, RN Phone Number: [phone] 01/09/2024, 12:10 PM  Clinical Narrative:                 Spoke to pt and wife at bedside. Pt gave permission to speak to wife for additional information.  Pt states he has scale at home for daily weights. Provided pt with Living Better with HF booklet. Educated on importance of daily weights and low sodium/heart healthy diet.  Drives to appts.  Expected Discharge Plan: Home/Self Care Barriers to Discharge: Continued Medical Work up   Patient Goals and CMS Choice Patient states their goals for this hospitalization and ongoing recovery are:: wants to remain independent          Expected Discharge Plan and Services   Discharge Planning Services: CM Consult   Living arrangements for the past 2 months: Single Family Home Expected Discharge Date: 01/08/24                                    Prior Living Arrangements/Services Living arrangements for the past 2 months: Single Family Home Lives with:: Spouse   Do you feel safe going back to the place where you live?: Yes      Need for Family Participation in Patient Care: No (Comment) Care giver support system in place?: Yes (comment) Current home services: DME (scale) Criminal Activity/Legal Involvement Pertinent to Current Situation/Hospitalization: No - Comment as needed  Activities of Daily Living   ADL Screening (condition at time of admission) Independently performs ADLs?: Yes (appropriate for developmental age) Is the patient deaf or have difficulty hearing?: No Does the patient have difficulty seeing, even when wearing glasses/contacts?: No Does the patient have difficulty concentrating, remembering, or making decisions?: No  Permission Sought/Granted Permission sought to share  information with : Case Manager, Family Supports, PCP Permission granted to share information with : Yes, Verbal Permission Granted  Share Information with NAME: Johnathan Anderson  Permission granted to share info w AGENCY: DME, PCP  Permission granted to share info w Relationship: wife  Permission granted to share info w Contact Information: [phone]  Emotional Assessment Appearance:: Appears stated age Attitude/Demeanor/Rapport: Engaged Affect (typically observed): Accepting Orientation: : Oriented to Self, Oriented to  Time, Oriented to Place, Oriented to Situation   Psych Involvement: No (comment)  Admission diagnosis:  Acute CHF (congestive heart failure) (HCC) [I50.9] Patient Active Problem List   Diagnosis Date Noted   Acute CHF (congestive heart failure) (HCC) 01/08/2024   Aortic valve replaced 04/26/2023   S/P AVR (aortic valve replacement) 04/26/2023   Chronic migraine without aura without status migrainosus, not intractable 09/14/2022   Essential hypertension 07/07/2022   Squamous cell carcinoma, 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   Migraine with aura and without status migrainosus, not intractable 02/19/2021   Thoracic aortic aneurysm without rupture (HCC) 10/05/2020   Severe aortic stenosis 10/05/2020   Bicuspid aortic valve 10/05/2020   Family history of aortic dissection 10/05/2020   Periodontitis 10/05/2020   Chronic back pain 02/24/2014   PCP:  Leonel Cole, MD Pharmacy:   [id number] - [place], [place] - [address] [place] [address] Phone: [phone]  Fax: [phone]     Social Drivers of Health (SDOH) Social History: SDOH Screenings   Food Insecurity: No Food Insecurity (01/08/2024)  Housing: Low Risk  (01/08/2024)  Transportation Needs: No Transportation Needs (01/08/2024)  Utilities: Not At Risk (01/08/2024)  Depression (PHQ2-9): Medium Risk (07/11/2023)  Financial Resource Strain: Medium Risk  (08/12/2022)  Tobacco Use: Medium Risk (01/08/2024)   SDOH Interventions:     Readmission Risk Interventions     No data to display

## 2024-01-09 NOTE — Plan of Care (Signed)

## 2024-01-09 NOTE — TOC CM/SW Note (Signed)
 Transition of Care [facility]) - Inpatient Brief Assessment   Patient Details  Name: Johnathan Anderson MRN: [id number] Date of Birth: 1965/11/10  Transition [facility] Care [facility]) CM/SW Contact:    Lauraine FORBES Saa, LCSW Phone Number: 01/09/2024, 9:33 AM   Clinical Narrative:  9:33 AM Per chart review, patient resides at home with spouse and child(ren). Patient has a PCP and insurance. Patient does not have SNF/HH/DME history. Patient's preferred pharmacy is [facility] [address] [place]. TOC consult was placed for [facility] screen and SNF placement. TOC will continue to follow and be available to assist.  Transition of Care Asessment: Insurance and Status: Insurance coverage has been reviewed Patient has primary care physician: Yes Home environment has been reviewed: Private Residence Prior level of function:: N/A Prior/Current Home Services: No current home services Social Drivers of Health Review: SDOH reviewed no interventions necessary Readmission risk has been reviewed: Yes Transition of care needs: transition of care needs identified, TOC will continue to follow

## 2024-01-09 NOTE — Progress Notes (Addendum)
 Advanced Heart Failure Rounding Note  Cardiologist: Stanly DELENA Leavens, MD  Chief Complaint: Heart Failure Subjective:   Admitted from cath with marked volume overload. Patent native coronaries and bypass grafts, RA mean 18, PA 94/30, PCWP mean 29, TD CI 1.97.   Started on IV lasix . I/O not accurate. Weight down 9 pounds.   CO-OX 67%  Feels much better. Denies SOB.  Objective:   Weight Range: 80.1 kg Body mass index is 28.5 kg/m.   Vital Signs:   Temp:  [97.9 F (36.6 C)-98.2 F (36.8 C)] 98 F (36.7 C) (08/05 0742) Pulse Rate:  [49-123] 50 (08/05 0742) Resp:  [13-27] 19 (08/05 0742) BP: (122-189)/(82-101) 122/89 (08/05 0742) SpO2:  [94 %-98 %] 95 % (08/05 0742) Weight:  [80.1 kg-84.1 kg] 80.1 kg (08/05 0551)    Weight change: Filed Weights   01/08/24 0544 01/08/24 1348 01/09/24 0551  Weight: 83.5 kg 84.1 kg 80.1 kg    Intake/Output:   Intake/Output Summary (Last 24 hours) at 01/09/2024 1025 Last data filed at 01/09/2024 0831 Gross per 24 hour  Intake 390 ml  Output 1100 ml  Net -710 ml    CVP 3 personally checked.   Physical Exam    General:   No resp difficulty Neck: no JVD.  Cor: Regular rate & rhythm. Lungs: clear Abdomen: soft, nontender, nondistended.  Extremities: no  edema. LUE PICC  Neuro: alert & oriented x3   Telemetry  SB with BBB   EKG    N/A   Labs    CBC Recent Labs    01/08/24 1428 01/09/24 0330  WBC 7.7 9.9  HGB 12.7* 14.1  HCT 38.7* 42.9  MCV 81.6 81.1  PLT 112* 122*   Basic Metabolic Panel Recent Labs    91/95/74 1428 01/09/24 0330  NA 140 141  K 4.0 3.8  CL 108 107  CO2 23 25  GLUCOSE 159* 103*  BUN 22* 20  CREATININE 1.07 1.22  CALCIUM  9.5 9.7   Liver Function Tests Recent Labs    01/08/24 1428  AST 26  ALT 32  ALKPHOS 69  BILITOT 1.4*  PROT 6.8  ALBUMIN  3.4*   No results for input(s): LIPASE, AMYLASE in the last 72 hours. Cardiac Enzymes No results for input(s): CKTOTAL, CKMB,  CKMBINDEX, TROPONINI in the last 72 hours.  BNP: BNP (last 3 results) Recent Labs    06/06/23 1125  BNP 195.6*    ProBNP (last 3 results) Recent Labs    09/30/23 2102  PROBNP 1,247.0*     D-Dimer No results for input(s): DDIMER in the last 72 hours. Hemoglobin A1C No results for input(s): HGBA1C in the last 72 hours. Fasting Lipid Panel No results for input(s): CHOL, HDL, LDLCALC, TRIG, CHOLHDL, LDLDIRECT in the last 72 hours. Thyroid Function Tests No results for input(s): TSH, T4TOTAL, T3FREE, THYROIDAB in the last 72 hours.  Invalid input(s): FREET3  Other results:   Imaging    DG CHEST PORT 1 VIEW Result Date: 01/08/2024 CLINICAL DATA:  PICC placement. EXAM: PORTABLE CHEST 1 VIEW COMPARISON:  10/10/2023 and CT chest 09/30/2023. FINDINGS: Trachea is midline. Left PICC tip is in the right atrium. Heart is at the upper limits of normal in size to mildly enlarged, stable. Cardiac monitoring device projects over the right ventricle. Aortic valve replacement. Mild bibasilar mixed interstitial and airspace opacification, right greater than left. Small bilateral pleural effusions. IMPRESSION: Mild congestive heart failure. Electronically Signed   By: Newell Eke M.D.  On: 01/08/2024 21:30   US  EKG SITE RITE Result Date: 01/08/2024 If Site Rite image not attached, placement could not be confirmed due to current cardiac rhythm.  CARDIAC CATHETERIZATION Result Date: 01/08/2024   Ost LM to Mid LM lesion is 25% stenosed.   SVG graft was visualized by angiography and is large.   SVG graft was visualized by angiography and is normal in caliber.   The graft exhibits no disease.   The graft exhibits no disease.   Hemodynamic findings consistent with severe pulmonary hypertension. Patent native coronary arteries Patent SVG to RCA Patent SVG to the LAD (off the SVG to RCA Moderately elevated LV filling pressures PCWP 30/29, mean 29 mm Hg Severe pulmonary HTN  PAP 94/25, mean 49 mm Hg. Elevated RA pressure 24/21, mean 18 mm Hg Low cardiac index 1.97. Impression; patent coronary arteries and grafts. Main issue is biventricular failure. Discussed with Dr Cherrie with Advanced heart failure. Will admit for IV inotropic therapy and optimization of CHF therapy     Medications:     Scheduled Medications:  Chlorhexidine  Gluconate Cloth  6 each Topical Daily   enoxaparin  (LOVENOX ) injection  40 mg Subcutaneous Q24H   furosemide   80 mg Intravenous BID   sacubitril -valsartan   1 tablet Oral BID   sodium chloride  flush  10-40 mL Intracatheter Q12H    Infusions:   PRN Medications: acetaminophen , ondansetron  (ZOFRAN ) IV, mouth rinse, sodium chloride  flush    Patient Profile  58 y.o. male with history of bicuspid aortic valve/ascending aortic aneurysm s/p AVR w/ Bental in 11/24. Surgery c/b difficulty weaning coming off pump/ischemic s/p SVG to RCA and SVG to LAD. Due to acute biventricular heart failure/profound shock he required [facility] ECMO and was later decannulated after he had cardiac recovery.    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   Assessment/Plan  Acute on chronic HFmrEF -S/p AVR w/ Bentall procedure in 11/24. D/t concern for ischemia/difficulty weaning from bypass also had RSVG from aortic graft to RCA and SVG to LAD. Developed profound shock/BiV failure and required [facility] ECMO. Transferred to [facility] for transplant evaluation but ultimately had cardiac recovery. -Echo on [facility] ECMO 11/24: EF < 20%, RV severely HK -Echo at [facility] 12/24: EF > 55%, RV okay, aortic valve prosthesis okay -Echo 12/05/23: EF 45-50%, RV low normal, RVSP 61 mmHg, aortic valve bioprosthesis okay - Limited Echo today EF 35-40% RV moderately reduced  - Admitted from cath lab with volume overload.  -R/LHC 01/08/24: Native coronaries and bypasses are patent, RA mean 18, PA 94/30, PCWP mean 29, Fick CI 1.97. - CVP down to 3-4. Give one more dose IV lasix .   - Continue   Entresto  24/26 mg BID - Add spiro 25 mg daily  - Add farxiga  10 mg daily  -No beta blocker for now with low-output/bradycardia -Renal function stable.  -Plan RHC tomorrow to reassess hemodynamics.  Informed Consent   Shared Decision Making/Informed Consent The risks, including but not limited to, [bleeding or vascular complications (1 in 500), pneumothorax (1 in 1600), arrhythmia (1 in 1000) and death (1 in 5000)], benefits (diagnostic support and/or management of heart failure, pulmonary hypertension) and alternatives of a right heart catheterization were discussed in detail with Mr. Cerrone and he is willing to proceed.     2. Bicuspid aortic valve/ascending aortic aneurysm -S/p Bental as above   3. 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 Wonder if he would eventually benefit from upgrade to CRT   4. HTN -Stable. Continue entresto  24-26 mg twice a day.  -As above adding spiro.    Possible D/C tomorrow. Will set up HF follow up.  Length of Stay: 1  Amy Clegg, NP  01/09/2024, 10:25 AM  Advanced Heart Failure Team Pager [phone] (M-F; 7a - 5p)  Please contact [facility] for night-coverage after hours (5p -7a ) and weekends on [email or web address]  Patient seen and examined with the above-signed Advanced Practice Provider and/or Housestaff. I personally reviewed laboratory data, imaging studies and relevant notes. I independently examined the patient and formulated the important aspects of the plan. I have edited the note to reflect any of my changes or salient points. I have personally discussed the plan with the patient and/or family.  He is diuresing well on IV lasix . Breathing better. Orthopnea/PND resolved. Co-ox 67% CVP 3  Tolerating GDMT  General:  Sitting up in bed. No resp difficulty HEENT: normal Neck: supple. no JVD.  Carotids 2+ bilat; no bruits. No lymphadenopathy or thryomegaly appreciated. Cor: Regular rate & rhythm. No rubs, gallops or murmurs. Lungs: clear Abdomen: soft, nontender, nondistended. No hepatosplenomegaly. No bruits or masses. Good bowel sounds. Extremities: no cyanosis, clubbing, rash, edema Neuro: alert & orientedx3, cranial nerves grossly intact. moves all 4 extremities w/o difficulty. Affect pleasant  He is much improved with diuresis and initiation of GDMT. Given degree of PH on initial cath will plan to repeat RHC tomorrow. Likely would benefit from CRT as outpatient.   Toribio Fuel, MD  6:39 PM

## 2024-01-09 NOTE — H&P (View-Only) (Signed)
 Advanced Heart Failure Rounding Note  Cardiologist: Stanly DELENA Leavens, MD  Chief Complaint: Heart Failure Subjective:   Admitted from cath with marked volume overload. Patent native coronaries and bypass grafts, RA mean 18, PA 94/30, PCWP mean 29, TD CI 1.97.   Started on IV lasix . I/O not accurate. Weight down 9 pounds.   CO-OX 67%  Feels much better. Denies SOB.  Objective:   Weight Range: 80.1 kg Body mass index is 28.5 kg/m.   Vital Signs:   Temp:  [97.9 F (36.6 C)-98.2 F (36.8 C)] 98 F (36.7 C) (08/05 0742) Pulse Rate:  [49-123] 50 (08/05 0742) Resp:  [13-27] 19 (08/05 0742) BP: (122-189)/(82-101) 122/89 (08/05 0742) SpO2:  [94 %-98 %] 95 % (08/05 0742) Weight:  [80.1 kg-84.1 kg] 80.1 kg (08/05 0551)    Weight change: Filed Weights   01/08/24 0544 01/08/24 1348 01/09/24 0551  Weight: 83.5 kg 84.1 kg 80.1 kg    Intake/Output:   Intake/Output Summary (Last 24 hours) at 01/09/2024 1025 Last data filed at 01/09/2024 0831 Gross per 24 hour  Intake 390 ml  Output 1100 ml  Net -710 ml    CVP 3 personally checked.   Physical Exam    General:   No resp difficulty Neck: no JVD.  Cor: Regular rate & rhythm. Lungs: clear Abdomen: soft, nontender, nondistended.  Extremities: no  edema. LUE PICC  Neuro: alert & oriented x3   Telemetry  SB with BBB   EKG    N/A   Labs    CBC Recent Labs    01/08/24 1428 01/09/24 0330  WBC 7.7 9.9  HGB 12.7* 14.1  HCT 38.7* 42.9  MCV 81.6 81.1  PLT 112* 122*   Basic Metabolic Panel Recent Labs    91/95/74 1428 01/09/24 0330  NA 140 141  K 4.0 3.8  CL 108 107  CO2 23 25  GLUCOSE 159* 103*  BUN 22* 20  CREATININE 1.07 1.22  CALCIUM  9.5 9.7   Liver Function Tests Recent Labs    01/08/24 1428  AST 26  ALT 32  ALKPHOS 69  BILITOT 1.4*  PROT 6.8  ALBUMIN  3.4*   No results for input(s): LIPASE, AMYLASE in the last 72 hours. Cardiac Enzymes No results for input(s): CKTOTAL, CKMB,  CKMBINDEX, TROPONINI in the last 72 hours.  BNP: BNP (last 3 results) Recent Labs    06/06/23 1125  BNP 195.6*    ProBNP (last 3 results) Recent Labs    09/30/23 2102  PROBNP 1,247.0*     D-Dimer No results for input(s): DDIMER in the last 72 hours. Hemoglobin A1C No results for input(s): HGBA1C in the last 72 hours. Fasting Lipid Panel No results for input(s): CHOL, HDL, LDLCALC, TRIG, CHOLHDL, LDLDIRECT in the last 72 hours. Thyroid Function Tests No results for input(s): TSH, T4TOTAL, T3FREE, THYROIDAB in the last 72 hours.  Invalid input(s): FREET3  Other results:   Imaging    DG CHEST PORT 1 VIEW Result Date: 01/08/2024 CLINICAL DATA:  PICC placement. EXAM: PORTABLE CHEST 1 VIEW COMPARISON:  10/10/2023 and CT chest 09/30/2023. FINDINGS: Trachea is midline. Left PICC tip is in the right atrium. Heart is at the upper limits of normal in size to mildly enlarged, stable. Cardiac monitoring device projects over the right ventricle. Aortic valve replacement. Mild bibasilar mixed interstitial and airspace opacification, right greater than left. Small bilateral pleural effusions. IMPRESSION: Mild congestive heart failure. Electronically Signed   By: Newell Eke M.D.  On: 01/08/2024 21:30   US  EKG SITE RITE Result Date: 01/08/2024 If Site Rite image not attached, placement could not be confirmed due to current cardiac rhythm.  CARDIAC CATHETERIZATION Result Date: 01/08/2024   Ost LM to Mid LM lesion is 25% stenosed.   SVG graft was visualized by angiography and is large.   SVG graft was visualized by angiography and is normal in caliber.   The graft exhibits no disease.   The graft exhibits no disease.   Hemodynamic findings consistent with severe pulmonary hypertension. Patent native coronary arteries Patent SVG to RCA Patent SVG to the LAD (off the SVG to RCA Moderately elevated LV filling pressures PCWP 30/29, mean 29 mm Hg Severe pulmonary HTN  PAP 94/25, mean 49 mm Hg. Elevated RA pressure 24/21, mean 18 mm Hg Low cardiac index 1.97. Impression; patent coronary arteries and grafts. Main issue is biventricular failure. Discussed with Dr Cherrie with Advanced heart failure. Will admit for IV inotropic therapy and optimization of CHF therapy     Medications:     Scheduled Medications:  Chlorhexidine  Gluconate Cloth  6 each Topical Daily   enoxaparin  (LOVENOX ) injection  40 mg Subcutaneous Q24H   furosemide   80 mg Intravenous BID   sacubitril -valsartan   1 tablet Oral BID   sodium chloride  flush  10-40 mL Intracatheter Q12H    Infusions:   PRN Medications: acetaminophen , ondansetron  (ZOFRAN ) IV, mouth rinse, sodium chloride  flush    Patient Profile  58 y.o. male with history of bicuspid aortic valve/ascending aortic aneurysm s/p AVR w/ Bental in 11/24. Surgery c/b difficulty weaning coming off pump/ischemic s/p SVG to RCA and SVG to LAD. Due to acute biventricular heart failure/profound shock he required [facility] ECMO and was later decannulated after he had cardiac recovery.    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   Assessment/Plan  Acute on chronic HFmrEF -S/p AVR w/ Bentall procedure in 11/24. D/t concern for ischemia/difficulty weaning from bypass also had RSVG from aortic graft to RCA and SVG to LAD. Developed profound shock/BiV failure and required VA ECMO. Transferred to [facility] for transplant evaluation but ultimately had cardiac recovery. -Echo on [facility] ECMO 11/24: EF < 20%, RV severely HK -Echo at [facility] 12/24: EF > 55%, RV okay, aortic valve prosthesis okay -Echo 12/05/23: EF 45-50%, RV low normal, RVSP 61 mmHg, aortic valve bioprosthesis okay - Limited Echo today EF 35-40% RV moderately reduced  - Admitted from cath lab with volume overload.  -R/LHC 01/08/24: Native coronaries and bypasses are patent, RA mean 18, PA 94/30, PCWP mean 29, Fick CI 1.97. - CVP down to 3-4. Give one more dose IV lasix .   - Continue   Entresto  24/26 mg BID - Add spiro 25 mg daily  - Add farxiga  10 mg daily  -No beta blocker for now with low-output/bradycardia -Renal function stable.  -Plan RHC tomorrow to reassess hemodynamics.  Informed Consent   Shared Decision Making/Informed Consent The risks, including but not limited to, [bleeding or vascular complications (1 in 500), pneumothorax (1 in 1600), arrhythmia (1 in 1000) and death (1 in 5000)], benefits (diagnostic support and/or management of heart failure, pulmonary hypertension) and alternatives of a right heart catheterization were discussed in detail with Mr. Cerrone and he is willing to proceed.     2. Bicuspid aortic valve/ascending aortic aneurysm -S/p Bental as above   3. 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 Wonder if he would eventually benefit from upgrade to CRT   4. HTN -Stable. Continue entresto  24-26 mg twice a day.  -As above adding spiro.    Possible D/C tomorrow. Will set up HF follow up.  Length of Stay: 1  Amy Clegg, NP  01/09/2024, 10:25 AM  Advanced Heart Failure Team Pager [phone] (M-F; 7a - 5p)  Please contact [facility] for night-coverage after hours (5p -7a ) and weekends on [email or web address]  Patient seen and examined with the above-signed Advanced Practice Provider and/or Housestaff. I personally reviewed laboratory data, imaging studies and relevant notes. I independently examined the patient and formulated the important aspects of the plan. I have edited the note to reflect any of my changes or salient points. I have personally discussed the plan with the patient and/or family.  He is diuresing well on IV lasix . Breathing better. Orthopnea/PND resolved. Co-ox 67% CVP 3  Tolerating GDMT  General:  Sitting up in bed. No resp difficulty HEENT: normal Neck: supple. no JVD.  Carotids 2+ bilat; no bruits. No lymphadenopathy or thryomegaly appreciated. Cor: Regular rate & rhythm. No rubs, gallops or murmurs. Lungs: clear Abdomen: soft, nontender, nondistended. No hepatosplenomegaly. No bruits or masses. Good bowel sounds. Extremities: no cyanosis, clubbing, rash, edema Neuro: alert & orientedx3, cranial nerves grossly intact. moves all 4 extremities w/o difficulty. Affect pleasant  He is much improved with diuresis and initiation of GDMT. Given degree of PH on initial cath will plan to repeat RHC tomorrow. Likely would benefit from CRT as outpatient.   Toribio Fuel, MD  6:39 PM

## 2024-01-10 ENCOUNTER — Other Ambulatory Visit (HOSPITAL_COMMUNITY): Payer: Self-pay

## 2024-01-10 ENCOUNTER — Encounter (HOSPITAL_COMMUNITY)
Admission: RE | Disposition: A | Discharge status: Home / Self Care | Payer: Self-pay | Source: Home / Self Care | Attending: Internal Medicine

## 2024-01-10 DIAGNOSIS — I5041 Acute combined systolic (congestive) and diastolic (congestive) heart failure: Secondary | ICD-10-CM

## 2024-01-10 DIAGNOSIS — I5043 Acute on chronic combined systolic (congestive) and diastolic (congestive) heart failure: Secondary | ICD-10-CM

## 2024-01-10 HISTORY — PX: RIGHT HEART CATH: CATH118263

## 2024-01-10 LAB — CBC
HCT: 46.6 % (ref 39.0–52.0)
Hemoglobin: 15.4 g/dL (ref 13.0–17.0)
MCH: 26.8 pg (ref 26.0–34.0)
MCHC: 33 g/dL (ref 30.0–36.0)
MCV: 81.2 fL (ref 80.0–100.0)
Platelets: 127 K/uL — ABNORMAL LOW (ref 150–400)
RBC: 5.74 MIL/uL (ref 4.22–5.81)
RDW: 15.8 % — ABNORMAL HIGH (ref 11.5–15.5)
WBC: 10.5 K/uL (ref 4.0–10.5)
nRBC: 0 % (ref 0.0–0.2)

## 2024-01-10 LAB — POCT I-STAT 7, (LYTES, BLD GAS, ICA,H+H)
Acid-Base Excess: 0 mmol/L (ref 0.0–2.0)
Acid-Base Excess: 0 mmol/L (ref 0.0–2.0)
Bicarbonate: 24.9 mmol/L (ref 20.0–28.0)
Bicarbonate: 25.1 mmol/L (ref 20.0–28.0)
Calcium, Ion: 1.23 mmol/L (ref 1.15–1.40)
Calcium, Ion: 1.27 mmol/L (ref 1.15–1.40)
HCT: 47 % (ref 39.0–52.0)
HCT: 48 % (ref 39.0–52.0)
Hemoglobin: 16 g/dL (ref 13.0–17.0)
Hemoglobin: 16.3 g/dL (ref 13.0–17.0)
O2 Saturation: 70 %
O2 Saturation: 70 %
Potassium: 4.1 mmol/L (ref 3.5–5.1)
Potassium: 4.2 mmol/L (ref 3.5–5.1)
Sodium: 141 mmol/L (ref 135–145)
Sodium: 142 mmol/L (ref 135–145)
TCO2: 26 mmol/L (ref 22–32)
TCO2: 26 mmol/L (ref 22–32)
pCO2 arterial: 40.3 mmHg (ref 32–48)
pCO2 arterial: 40.4 mmHg (ref 32–48)
pH, Arterial: 7.398 (ref 7.35–7.45)
pH, Arterial: 7.403 (ref 7.35–7.45)
pO2, Arterial: 37 mmHg — CL (ref 83–108)
pO2, Arterial: 37 mmHg — CL (ref 83–108)

## 2024-01-10 LAB — BASIC METABOLIC PANEL WITH GFR
Anion gap: 11 (ref 5–15)
BUN: 22 mg/dL — ABNORMAL HIGH (ref 6–20)
CO2: 25 mmol/L (ref 22–32)
Calcium: 9.7 mg/dL (ref 8.9–10.3)
Chloride: 104 mmol/L (ref 98–111)
Creatinine, Ser: 1.17 mg/dL (ref 0.61–1.24)
GFR, Estimated: >60 mL/min (ref >60)
Glucose, Bld: 103 mg/dL — ABNORMAL HIGH (ref 70–99)
Potassium: 4.1 mmol/L (ref 3.5–5.1)
Sodium: 140 mmol/L (ref 135–145)

## 2024-01-10 LAB — POCT I-STAT EG7
Acid-Base Excess: 1 mmol/L (ref 0.0–2.0)
Bicarbonate: 26.2 mmol/L (ref 20.0–28.0)
Calcium, Ion: 1.25 mmol/L (ref 1.15–1.40)
HCT: 47 % (ref 39.0–52.0)
Hemoglobin: 16 g/dL (ref 13.0–17.0)
O2 Saturation: 69 %
Potassium: 4.2 mmol/L (ref 3.5–5.1)
Sodium: 141 mmol/L (ref 135–145)
TCO2: 27 mmol/L (ref 22–32)
pCO2, Ven: 43.3 mmHg — ABNORMAL LOW (ref 44–60)
pH, Ven: 7.389 (ref 7.25–7.43)
pO2, Ven: 37 mmHg (ref 32–45)

## 2024-01-10 LAB — COOXEMETRY PANEL
Carboxyhemoglobin: 1.8 % — ABNORMAL HIGH (ref 0.5–1.5)
Methemoglobin: <0.7 % (ref 0.0–1.5)
O2 Saturation: 70.8 %
Total hemoglobin: 15.7 g/dL (ref 12.0–16.0)

## 2024-01-10 LAB — LIPOPROTEIN A (LPA): Lipoprotein (a): 11.5 nmol/L (ref <75.0)

## 2024-01-10 SURGERY — RIGHT HEART CATH
Anesthesia: LOCAL

## 2024-01-10 MED ORDER — SPIRONOLACTONE 25 MG PO TABS
12.5000 mg | ORAL_TABLET | Freq: Every day | ORAL | 3 refills | Status: DC
  Filled 2024-01-10: qty 15, 30d supply, fill #0

## 2024-01-10 MED ORDER — DAPAGLIFLOZIN PROPANEDIOL 10 MG PO TABS
10.0000 mg | ORAL_TABLET | Freq: Every day | ORAL | 3 refills | Status: DC
  Filled 2024-01-10: qty 30, 30d supply, fill #0

## 2024-01-10 MED ORDER — HEPARIN (PORCINE) IN NACL 1000-0.9 UT/500ML-% IV SOLN
INTRAVENOUS | Status: DC | PRN
  Administered 2024-01-10: 500 mL

## 2024-01-10 MED ORDER — LIDOCAINE HCL (PF) 1 % IJ SOLN
INTRAMUSCULAR | Status: AC
  Filled 2024-01-10: qty 30

## 2024-01-10 MED ORDER — SACUBITRIL-VALSARTAN 24-26 MG PO TABS
1.0000 | ORAL_TABLET | Freq: Two times a day (BID) | ORAL | 3 refills | Status: DC
  Filled 2024-01-10: qty 60, 30d supply, fill #0

## 2024-01-10 MED ORDER — FUROSEMIDE 20 MG PO TABS
20.0000 mg | ORAL_TABLET | Freq: Every day | ORAL | 3 refills | Status: DC | PRN
  Filled 2024-01-10: qty 90, 90d supply, fill #0

## 2024-01-10 MED ORDER — LIDOCAINE HCL (PF) 1 % IJ SOLN
INTRAMUSCULAR | Status: DC | PRN
  Administered 2024-01-10: 2 mL via INTRADERMAL

## 2024-01-10 SURGICAL SUPPLY — 7 items
CATH SWAN GANZ 7F STRAIGHT (CATHETERS) IMPLANT
PACK CARDIAC CATHETERIZATION (CUSTOM PROCEDURE TRAY) ×1 IMPLANT
SHEATH PINNACLE 7F 10CM (SHEATH) IMPLANT
SHEATH PROBE COVER 6X72 (BAG) IMPLANT
TRANSDUCER W/STOPCOCK (MISCELLANEOUS) IMPLANT
TUBING ART PRESS 72 MALE/FEM (TUBING) IMPLANT
WIRE MICRO SET SILHO 5FR 7 (SHEATH) IMPLANT

## 2024-01-10 NOTE — Interval H&P Note (Signed)
 History and Physical Interval Note:  01/10/2024 2:54 PM  Johnathan Anderson  has presented today for surgery, with the diagnosis of heart failure.  The various methods of treatment have been discussed with the patient and family. After consideration of risks, benefits and other options for treatment, the patient has consented to  Procedure(s): RIGHT HEART CATH (N/A) as a surgical intervention.  The patient's history has been reviewed, patient examined, no change in status, stable for surgery.  I have reviewed the patient's chart and labs.  Questions were answered to the patient's satisfaction.     Morene JINNY Brownie

## 2024-01-10 NOTE — Discharge Summary (Addendum)
 Advanced Heart Failure Team  Discharge Summary   Patient ID: ACEYN KATHOL MRN: [id number], DOB/AGE: 01/30/1966 58 y.o. 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 male with history of HFrEF, GERD, HTN, HLD, bicuspid aortic valve/ascending aortic aneurysm. He presented for elective AVR with Bentall procedure in 11/24. There was significant difficulty coming off pump w/ concern for coronary ischemia.  Therefore, he underwent SVG to RCA and SVG to LAD. Postop he had significant bleeding, VT arrest and biventricular shock (RV predominant) requiring central VA ECMO cannulation. He was unable to wean from ECMO and was transferred to [facility] for transplant evaluation. He later had cardiac recovery and underwent ECMO decannulation. Had Micra leadless PPM placed prior to discharge d/t intermittent heart block.   Over the last 3 months he has been experiencing worsening chest pain and shortness of breath with exertion. He has been followed by Dr. Santo. Echo 12/05/23: EF 45-50%, RV low normal, RVSP 61 mmHg, aortic valve prosthesis okay with mean gradient 12 mmHg. He had an abnormal stress MPI with large fixed mid to basal inferior defect.    R/LHC 01/08/24: Patent native coronaries and bypass grafts, RA mean 18, PA 94/30, PCWP mean 29, TD CI 1.97.  He was admitted post cath for IV diuresis and medical optimization. He responded well to IV diuretics and did not require inotrope support. GDMT initiated. Repeat echo during admit with LVEF 35-40%, RV moderately reduced, aortic valve prosthesis okay. RHC repeated prior to discharge to ensure euvolemia (mRAP 2, PAP 45/11(18), mPCWP 7, CI 2.3). He was discharged home on 8/6 on Entresto , Farxiga , spironolactone  and PRN lasix .   Given high burden of RV pacing noted on telemtry, may need to  consider upgrading his device to CRT-P down the line.   Close follow-up in Advanced Heart Failure Clinic arranged.  Discharge Weight Range: 173 lb  Discharge Vitals: Blood pressure 124/79, pulse (!) 50, temperature 98.3 F (36.8 C), temperature source Oral, resp. rate 17, height 5' 6 (1.676 m), weight 78.8 kg, SpO2 96%.  PHYSICAL EXAM: General:  Well appearing. No respiratory difficulty Cor: No JVD. Regular rate & rhythm. No rubs, gallops or murmurs. Lungs: clear Abdomen: soft, nontender, nondistended.  Extremities: no edema Neuro: alert & oriented x 3. Affect pleasant.   Labs: Lab Results  Component Value Date   WBC 10.5 01/10/2024   HGB 16.0 01/10/2024   HCT 47.0 01/10/2024   MCV 81.2 01/10/2024   PLT 127 (L) 01/10/2024    Recent Labs  Lab 01/08/24 1428 01/09/24 0330 01/10/24 0450 01/10/24 1515 01/10/24 1520  NA 140   < > 140   < > 141  K 4.0   < > 4.1   < > 4.2  CL 108   < > 104  --   --   CO2 23   < > 25  --   --   BUN 22*   < > 22*  --   --   CREATININE 1.07   < > 1.17  --   --   CALCIUM  9.5   < > 9.7  --   --   PROT 6.8  --   --   --   --   BILITOT 1.4*  --   --   --   --   ALKPHOS 69  --   --   --   --  ALT 32  --   --   --   --   AST 26  --   --   --   --   GLUCOSE 159*   < > 103*  --   --    < > = values in this interval not displayed.   Lab Results  Component Value Date   CHOL 133 12/20/2021   HDL 40 12/20/2021   LDLCALC 72 12/20/2021   TRIG 116 12/20/2021   BNP (last 3 results) Recent Labs    06/06/23 1125  BNP 195.6*    ProBNP (last 3 results) Recent Labs    09/30/23 2102  PROBNP 1,247.0*     Diagnostic Studies/Procedures   ECHOCARDIOGRAM LIMITED Result Date: 01/09/2024    ECHOCARDIOGRAM LIMITED REPORT   Patient Name:   JESSEN SIEGMAN Date of Exam: 01/09/2024 Medical Rec #:  [id number]        Height:       66.0 in Accession #:    [id number]       Weight:       176.6 lb Date of Birth:  22-Oct-1965         BSA:          1.897 m Patient  Age:    58 years         BP:           122/89 mmHg Patient Gender: M                HR:           46 bpm. Exam Location:  Inpatient Procedure: Limited Echo, Color Doppler and Cardiac Doppler (Both Spectral and            Color Flow Doppler were utilized during procedure). Indications:    I50.21 Acute systolic (congestive) heart failure  History:        Patient has prior history of Echocardiogram examinations, most                 recent 12/05/2023. Risk Factors:Hypertension and Dyslipidemia.                 Aortic Valve: 23 mm Konect Resilia Conduit valve is present in                 the aortic position. Procedure Date: 04/26/23.  Sonographer:    Damien Senior RDCS Referring Phys: [phone] [facility] NICOLE [facility]  Sonographer Comments: Limited for CHF IMPRESSIONS  1. Global hypokinesis worse in septum/apex. Left ventricular ejection fraction, by estimation, is 35 to 40%. Left ventricular ejection fraction by 2D MOD biplane is 37.9 %. The left ventricle has moderately decreased function. The left ventricular internal cavity size was mildly dilated.  2. Right ventricular systolic function is moderately reduced. The right ventricular size is mildly enlarged. Tricuspid regurgitation signal is inadequate for assessing PA pressure.  3. The mitral valve is abnormal. Mild mitral valve regurgitation.  4. The aortic valve has been repaired/replaced. Aortic valve regurgitation is not visualized. There is a 23 mm Konect Resilia Conduit valve present in the aortic position. Procedure Date: 04/26/23. Echo findings are consistent with normal structure and function of the aortic valve prosthesis.  5. The inferior vena cava is normal in size with greater than 50% respiratory variability, suggesting right atrial pressure of 3 mmHg. FINDINGS  Left Ventricle: Global hypokinesis worse in septum/apex. Left ventricular ejection fraction, by estimation, is 35 to 40%. Left ventricular ejection fraction by 2D MOD  biplane is 37.9 %. The left ventricle  has moderately decreased function. Definity  contrast agent was given IV to delineate the left ventricular endocardial borders. The left ventricular internal cavity size was mildly dilated. Right Ventricle: The right ventricular size is mildly enlarged. Right ventricular systolic function is moderately reduced. Tricuspid regurgitation signal is inadequate for assessing PA pressure. Mitral Valve: The mitral valve is abnormal. There is mild thickening of the mitral valve leaflet(s). There is mild calcification of the mitral valve leaflet(s). Mild mitral valve regurgitation. Tricuspid Valve: The tricuspid valve is normal in structure. Tricuspid valve regurgitation is mild. Aortic Valve: The aortic valve has been repaired/replaced. Aortic valve regurgitation is not visualized. There is a 23 mm Konect Resilia Conduit valve present in the aortic position. Procedure Date: 04/26/23. Echo findings are consistent with normal structure and function of the aortic valve prosthesis. Venous: The inferior vena cava is normal in size with greater than 50% respiratory variability, suggesting right atrial pressure of 3 mmHg. Additional Comments: Spectral Doppler performed. Color Doppler performed.  LEFT VENTRICLE PLAX 2D                        Biplane EF (MOD) LVOT diam:     2.10 cm         LV Biplane EF:   Left LV SV:         52                               ventricular LV SV Index:   27                               ejection LVOT Area:     3.46 cm                         fraction by                                                 2D MOD                                                 biplane is LV Volumes (MOD)                                37.9 %. LV vol d, MOD    100.0 ml A2C: LV vol d, MOD    129.0 ml A4C: LV vol s, MOD    68.6 ml A2C: LV vol s, MOD    71.7 ml A4C: LV SV MOD A2C:   31.4 ml LV SV MOD A4C:   129.0 ml LV SV MOD BP:    43.4 ml RIGHT VENTRICLE RV S prime:     5.87 cm/s TAPSE (M-mode): 0.8 cm AORTIC VALVE LVOT Vmax:    90.20 cm/s LVOT Vmean:  67.000 cm/s LVOT VTI:    0.150 m  SHUNTS Systemic VTI:  0.15 m Systemic Diam: 2.10 cm Maude Emmer MD Electronically signed by Maude Emmer MD  Signature Date/Time: 01/09/2024/11:13:56 AM    Final    DG CHEST PORT 1 VIEW Result Date: 01/08/2024 CLINICAL DATA:  PICC placement. EXAM: PORTABLE CHEST 1 VIEW COMPARISON:  10/10/2023 and CT chest 09/30/2023. FINDINGS: Trachea is midline. Left PICC tip is in the right atrium. Heart is at the upper limits of normal in size to mildly enlarged, stable. Cardiac monitoring device projects over the right ventricle. Aortic valve replacement. Mild bibasilar mixed interstitial and airspace opacification, right greater than left. Small bilateral pleural effusions. IMPRESSION: Mild congestive heart failure. Electronically Signed   By: Newell Eke M.D.   On: 01/08/2024 21:30    Repeat RHC 01/10/24 Fick Cardiac Output 4.28 L/min  Fick Cardiac Output Index 2.27 (L/min)/BSA  Thermal Cardiac Output 3.4 L/min  Thermal Cardiac Output Index 1.8 (L/min)/BSA  RA A Wave 7 mmHg  RA V Wave 3 mmHg  RA Mean 2 mmHg  RV Systolic Pressure 28 mmHg  RV Diastolic Pressure 12 mmHg  RV EDP 2 mmHg  PA Systolic Pressure 45 mmHg  PA Diastolic Pressure 11 mmHg  PA Mean 18 mmHg  PW A Wave 11 mmHg  PW V Wave 7 mmHg  PW Mean 7 mmHg  AO Systolic Pressure 144 mmHg  AO Diastolic Pressure 85 mmHg  AO Mean 109 mmHg  QP/QS 1  TPVR Index 9.98 HRUI  TSVR Index 55.26 HRUI  PVR SVR Ratio 0.22  TPVR/TSVR Ratio 0.45    Discharge Medications   Allergies as of 01/10/2024       Reactions   Levofloxacin Other (See Comments)   unknown unsure-cardiologist stated allergy        Medication List     TAKE these medications    albuterol  108 (90 Base) MCG/ACT inhaler Commonly known as: VENTOLIN  HFA Inhale 2 puffs into the lungs every 4 (four) hours as needed for wheezing or shortness of breath.   aspirin  81 MG chewable tablet Chew 81 mg by mouth at bedtime.    budesonide-formoterol 160-4.5 MCG/ACT inhaler Commonly known as: SYMBICORT Inhale 2 puffs into the lungs 2 (two) times daily.   Entresto  24-26 MG Generic drug: sacubitril -valsartan  Take 1 tablet by mouth 2 (two) times daily.   Farxiga  10 MG Tabs tablet Generic drug: dapagliflozin  propanediol Take 1 tablet (10 mg total) by mouth daily. Start taking on: January 11, 2024   furosemide  20 MG tablet Commonly known as: LASIX  Take 1 tablet (20 mg total) by mouth daily as needed (for weight gain > 3 lb in 24 hr or > 5 lb in 1 wk or for leg swelling). What changed:  when to take this reasons to take this   omeprazole  40 MG capsule Commonly known as: PRILOSEC Take 1 capsule (40 mg total) by mouth daily.   rosuvastatin  10 MG tablet Commonly known as: CRESTOR  Take 10 mg by mouth every evening.   spironolactone  25 MG tablet Commonly known as: ALDACTONE  Take 0.5 tablets (12.5 mg total) by mouth daily. Start taking on: January 11, 2024        Disposition   The patient will be discharged in stable condition to home. Discharge Instructions     Diet - low sodium heart healthy   Complete by: As directed    Increase activity slowly   Complete by: As directed        Follow-up Information      [facility] Follow up on 01/16/2024.   Specialty: Cardiology Why: at 9:30 Contact information: [address] [place]  [address] [phone]        Leonel Cole, MD Follow up.   Specialty: Family Medicine Why: Office will call you to schedule apt Contact information: [address] [place] [address] [phone]                   Duration of Discharge Encounter: Greater than 35 minutes   Signed, COLLETTA MANUELITA SAILOR PA-C  01/10/2024, 4:27 PM   Patient seen and examined with the above-signed Advanced Practice Provider and/or Housestaff. I personally reviewed laboratory data, imaging studies and  relevant notes. I independently examined the patient and formulated the important aspects of the plan. I have edited the note to reflect any of my changes or salient points. I have personally discussed the plan with the patient and/or family.  Overall much improved with diuresis and addition of GDMT.   Repeat RHC reviewed with evidence of excellent decongestion and reduction in PA pressures. CO remains marginal.   General:  Well appearing. No resp difficulty HEENT: normal Neck: supple. no JVD. Carotids 2+ bilat; no bruits. No lymphadenopathy or thryomegaly appreciated. Cor: PMI nondisplaced. Regular rate & rhythm. No rubs, gallops or murmurs. Lungs: clear Abdomen: soft, nontender, nondistended. No hepatosplenomegaly. No bruits or masses. Good bowel sounds. Extremities: no cyanosis, clubbing, rash, edema Neuro: alert & orientedx3, cranial nerves grossly intact. moves all 4 extremities w/o difficulty. Affect pleasant  Ok for d/c today. Medication access d/w PharmD at length. Will arrange close f/u in HF Clinic    MD time for d/c 38 mins.  Toribio Fuel, MD  4:36 PM

## 2024-01-11 ENCOUNTER — Telehealth: Payer: Self-pay

## 2024-01-11 ENCOUNTER — Encounter (HOSPITAL_COMMUNITY): Payer: Self-pay | Admitting: Cardiology

## 2024-01-11 NOTE — Telephone Encounter (Signed)
 Received message form Glendia Ferrier starting pt does not need an appt with him and an appt with CHF so close together. CHF clinic will see pt on 8/12.  Called pt to reschedule with primary cardiologist, no appointments available. Pt scheduled with Scott for 10/8. Pt thankful for call. No questions/concerns voiced at this time.

## 2024-01-12 ENCOUNTER — Ambulatory Visit: Admitting: Physician Assistant

## 2024-01-12 NOTE — Progress Notes (Signed)
 ADVANCED HF CLINIC CONSULT NOTE   PCP: Leonel Cole, MD Primary Cardiologist: Stanly DELENA Leavens, MD HF Cardiologist: Dr. Cherrie  HPI: 58 y.o. male with history of HFrEF, GERD, HTN, HLD, bicuspid aortic valve/ascending aortic aneurysm.   He presented for elective AVR with Bentall procedure in 11/24. There was significant difficulty coming off pump w/ concern for coronary ischemia.  Therefore, he underwent SVG to RCA and SVG to LAD. Postop he had significant bleeding, VT arrest and biventricular shock (RV predominant) requiring central VA ECMO cannulation. He was unable to wean from ECMO and was transferred to [facility] for transplant evaluation. He later had cardiac recovery and underwent ECMO decannulation. Had Micra leadless PPM placed prior to discharge d/t intermittent heart block.    He has been followed by Dr. Leavens. Echo 12/05/23: EF 45-50%, RV low normal, RVSP 61 mmHg, aortic valve prosthesis okay with mean gradient 12 mmHg. He had an abnormal stress MPI with large fixed mid to basal inferior defect. Underwent R/LHC showing patent native coronaries and bypass grafts, RA mean 18, PA 94/30, PCWP mean 29, TD CI 1.97. He was admitted from cath lab for IV diuresis and medical optimization. He responded well to IV diuretics and did not require inotrope support. GDMT initiated. Repeat echo showed EF 35-40%, RV moderately reduced, aortic valve prosthesis okay. RHC repeated prior to discharge to ensure euvolemia (RA 2, PAP 45/11 (18), PCWP 7, CI 2.3). He was discharged home after GDMT titration, weight 173 lbs.   Today he returns for post hospital HF follow up. Overall feeling fine. He is not SOB with ADLs, previously had SOB taking trash down driveway.  Denies palpitations, abnormal bleeding, CP, dizziness, edema, or PND/Orthopnea. Appetite ok. Weight at home 175 pounds. Taking all medications. He takes Lasix  daily. He is on disability.   Cardiac Studies - RHC 01/10/24: RA 2, PA 45/12  (19), PCWP 8, CO/CI (Fick) 4.28/2.27, PVR 2.57 WU, PAPi 11 - Ltd echo 01/09/24: EF 35-40%, RV moderately reduced - R/LHC 01/08/24: native coronaries and bypass grafts, RA mean 18, PA 94/30, PCWP mean 29, TD CI 1.97 - stress MPI 7/25: large fixed mid to basal inferior defect. - Echo 7/25: EF 45-50%, RV low normal, RVSP 61 mmHg, aortic valve prosthesis okay with mean gradient 12 mmHg.  Past Medical History:  Diagnosis Date   Aortic aneurysm (HCC)    Arthritis    Left Knee   Cancer (HCC) 2022   Skin Cancer   GERD (gastroesophageal reflux disease)    Headache    Migraines   Heart murmur    Hyperlipidemia    Hypertension    Peripheral vascular disease (HCC)    Thoracic Aortic Aneurysm   Vertigo    Current Outpatient Medications  Medication Sig Dispense Refill   albuterol  (VENTOLIN  HFA) 108 (90 Base) MCG/ACT inhaler Inhale 2 puffs into the lungs every 4 (four) hours as needed for wheezing or shortness of breath.     aspirin  81 MG chewable tablet Chew 81 mg by mouth at bedtime.     budesonide-formoterol (SYMBICORT) 160-4.5 MCG/ACT inhaler Inhale 2 puffs into the lungs 2 (two) times daily.     dapagliflozin  propanediol (FARXIGA ) 10 MG TABS tablet Take 1 tablet (10 mg total) by mouth daily. 30 tablet 3   furosemide  (LASIX ) 20 MG tablet Take 1 tablet (20 mg total) by mouth daily as needed (for weight gain > 3 lb in 24 hr or > 5 lb in 1 wk or for leg swelling). (Patient  taking differently: Take 20 mg by mouth daily.) 90 tablet 3   omeprazole  (PRILOSEC) 40 MG capsule Take 1 capsule (40 mg total) by mouth daily. 30 capsule 3   rosuvastatin  (CRESTOR ) 10 MG tablet Take 10 mg by mouth every evening.     sacubitril -valsartan  (ENTRESTO ) 24-26 MG Take 1 tablet by mouth 2 (two) times daily. 60 tablet 3   spironolactone  (ALDACTONE ) 25 MG tablet Take 0.5 tablets (12.5 mg total) by mouth daily. 15 tablet 3   No current facility-administered medications for this encounter.    Allergies  Allergen Reactions    Levofloxacin Other (See Comments)    unknown  unsure-cardiologist stated allergy      Social History   Socioeconomic History   Marital status: Married    Spouse name: Not on file   Number of children: Not on file   Years of education: Not on file   Highest education level: Not on file  Occupational History   Not on file  Tobacco Use   Smoking status: Former    Types: Cigarettes   Smokeless tobacco: Never   Tobacco comments:    Pt quit smoking cigarettes in 2005. 1 pack of cigarettes per week.  Vaping Use   Vaping status: Never Used  Substance and Sexual Activity   Alcohol use: No   Drug use: No   Sexual activity: Yes  Other Topics Concern   Not on file  Social History Narrative   Not on file   Social Drivers of Health   Financial Resource Strain: Medium Risk (08/12/2022)   Overall Financial Resource Strain (CARDIA)    Difficulty of Paying Living Expenses: Somewhat hard  Food Insecurity: No Food Insecurity (01/08/2024)   Hunger Vital Sign    Worried About Running Out of Food in the Last Year: Never true    Ran Out of Food in the Last Year: Never true  Transportation Needs: No Transportation Needs (01/08/2024)   PRAPARE - Administrator, Civil Service (Medical): No    Lack of Transportation (Non-Medical): No  Physical Activity: Not on file  Stress: Not on file  Social Connections: Not on file  Intimate Partner Violence: Not At Risk (01/08/2024)   Humiliation, Afraid, Rape, and Kick questionnaire    Fear of Current or Ex-Partner: No    Emotionally Abused: No    Physically Abused: No    Sexually Abused: No   Family History  Problem Relation Age of Onset   Hypertension Mother    Diabetes Father    Migraines Neg Hx     Wt Readings from Last 3 Encounters:  01/16/24 81.7 kg (180 lb 3.2 oz)  01/10/24 78.8 kg (173 lb 12.8 oz)  12/20/23 84.3 kg (185 lb 12.8 oz)   BP 132/80   Pulse (!) 48   Ht 5' 6 (1.676 m)   Wt 81.7 kg (180 lb 3.2 oz)   SpO2 98%    BMI 29.09 kg/m   PHYSICAL EXAM: General:  NAD. No resp difficulty, walked into clinic HEENT: Normal Neck: Supple. No JVD. Cor: Regular rate & rhythm. No rubs, gallops or murmurs. Lungs: Clear Abdomen: Soft, nontender, nondistended.  Extremities: No cyanosis, clubbing, rash, edema Neuro: Alert & oriented x 3, moves all 4 extremities w/o difficulty. Affect pleasant.  ECG (personally reviewed): SR, V paced QRS 204 msec   ASSESSMENT & PLAN: Chronic HFmrEF - S/p AVR w/ Bentall procedure in 11/24. D/t concern for ischemia/difficulty weaning from bypass also had RSVG from aortic  graft to RCA and SVG to LAD. Developed profound shock/BiV failure and required VA ECMO. Transferred to [facility] for transplant evaluation but ultimately had cardiac recovery. - Echo on VA ECMO 11/24: EF < 20%, RV severely HK - Echo at [facility] 12/24: EF > 55%, RV okay, aortic valve prosthesis okay - Echo 12/05/23: EF 45-50%, RV low normal, RVSP 61 mmHg, aortic valve bioprosthesis okay - Ltd 01/09/24: EF 35-40% RV moderately reduced  - R/LHC 01/08/24: Native cors and bypasses are patent; RA 18, PA 94/30, PCWP 29,  CI 1.97 9Fick). - RHC 01/10/24: RA 3, PA 45/12 (19), PCWP 8, CO/CI (Fick) 4.28/2.27, PVR 2.57 WU, PAPi 11 - NYHA I, volume OK today. - Increase spiro to 25 mg daily. - Continue Lasix  20 mg daily - Continue Entresto  24/26 mg bid - Continue Farxiga  10 mg daily. - No beta blocker for now with low-output/bradycardia - Labs today, repeat BMET in 1 week - Update echo in 3 months.    2. Bicuspid aortic valve/ascending aortic aneurysm - S/p Bental as above   3. High grade heart block - S/p micra leadless pacemaker at [facility] 12/24 - Followed at [facility] - QRS > 150 ms, 1% V pacing on device check in 11/2023, but seems to have higher burden of pacing on tele during recent admit  - ? If he would eventually benefit from upgrade to CRT   4. HTN - Stable.  - Med changes as above  Follow up in 3 weeks with APP (GDMT  titration) and 3 months with Dr. Cherrie + echo  Ramona, FNP-BC 01/16/24

## 2024-01-15 ENCOUNTER — Telehealth (HOSPITAL_COMMUNITY): Payer: Self-pay

## 2024-01-15 ENCOUNTER — Telehealth (HOSPITAL_COMMUNITY): Payer: Self-pay | Admitting: *Deleted

## 2024-01-15 NOTE — Telephone Encounter (Signed)
 Called to confirm/remind patient of their appointment at the [facility] on 01/16/24.     Appointment:              [] Confirmed             [x] Left mess              [] No answer/No voice mail             [] Phone not in service   Patient reminded to bring all medications and/or complete list.   Confirmed patient has transportation. Gave directions, instructed to utilize valet parking.

## 2024-01-15 NOTE — Telephone Encounter (Signed)
 Called to confirm/remind patient of their appointment at the [facility] on 01/16/24.   Appointment:   [] Confirmed  [x] Left mess   [] No answer/No voice mail  [] VM Full/unable to leave message  [] Phone not in service  And to bring in all medications and/or complete list.

## 2024-01-16 ENCOUNTER — Ambulatory Visit (HOSPITAL_COMMUNITY)
Admit: 2024-01-16 | Discharge: 2024-01-16 | Disposition: A | Discharge status: Home / Self Care | Attending: Family Medicine | Admitting: Family Medicine

## 2024-01-16 ENCOUNTER — Ambulatory Visit (HOSPITAL_COMMUNITY): Payer: Self-pay | Admitting: Family Medicine

## 2024-01-16 ENCOUNTER — Encounter (HOSPITAL_COMMUNITY): Payer: Self-pay

## 2024-01-16 VITALS — BP 132/80 | HR 48 | Ht 66.0 in | Wt 180.2 lb

## 2024-01-16 DIAGNOSIS — Z79899 Other long term (current) drug therapy: Secondary | ICD-10-CM | POA: Insufficient documentation

## 2024-01-16 DIAGNOSIS — Q2381 Bicuspid aortic valve: Secondary | ICD-10-CM | POA: Diagnosis not present

## 2024-01-16 DIAGNOSIS — Z952 Presence of prosthetic heart valve: Secondary | ICD-10-CM | POA: Insufficient documentation

## 2024-01-16 DIAGNOSIS — I10 Essential (primary) hypertension: Secondary | ICD-10-CM | POA: Diagnosis not present

## 2024-01-16 DIAGNOSIS — I442 Atrioventricular block, complete: Secondary | ICD-10-CM | POA: Insufficient documentation

## 2024-01-16 DIAGNOSIS — Z87891 Personal history of nicotine dependence: Secondary | ICD-10-CM | POA: Insufficient documentation

## 2024-01-16 DIAGNOSIS — K219 Gastro-esophageal reflux disease without esophagitis: Secondary | ICD-10-CM | POA: Diagnosis not present

## 2024-01-16 DIAGNOSIS — I5020 Unspecified systolic (congestive) heart failure: Secondary | ICD-10-CM | POA: Insufficient documentation

## 2024-01-16 DIAGNOSIS — I459 Conduction disorder, unspecified: Secondary | ICD-10-CM

## 2024-01-16 DIAGNOSIS — I5022 Chronic systolic (congestive) heart failure: Secondary | ICD-10-CM | POA: Diagnosis present

## 2024-01-16 DIAGNOSIS — Z8774 Personal history of (corrected) congenital malformations of heart and circulatory system: Secondary | ICD-10-CM | POA: Diagnosis not present

## 2024-01-16 DIAGNOSIS — Z951 Presence of aortocoronary bypass graft: Secondary | ICD-10-CM | POA: Insufficient documentation

## 2024-01-16 DIAGNOSIS — Z8674 Personal history of sudden cardiac arrest: Secondary | ICD-10-CM | POA: Insufficient documentation

## 2024-01-16 DIAGNOSIS — I110 Hypertensive heart disease with heart failure: Secondary | ICD-10-CM | POA: Insufficient documentation

## 2024-01-16 DIAGNOSIS — Z950 Presence of cardiac pacemaker: Secondary | ICD-10-CM | POA: Insufficient documentation

## 2024-01-16 DIAGNOSIS — E785 Hyperlipidemia, unspecified: Secondary | ICD-10-CM | POA: Insufficient documentation

## 2024-01-16 LAB — BRAIN NATRIURETIC PEPTIDE: B Natriuretic Peptide: 532.1 pg/mL — ABNORMAL HIGH (ref 0.0–100.0)

## 2024-01-16 LAB — BASIC METABOLIC PANEL WITH GFR
Anion gap: 11 (ref 5–15)
BUN: 21 mg/dL — ABNORMAL HIGH (ref 6–20)
CO2: 25 mmol/L (ref 22–32)
Calcium: 10.3 mg/dL (ref 8.9–10.3)
Chloride: 102 mmol/L (ref 98–111)
Creatinine, Ser: 1.66 mg/dL — ABNORMAL HIGH (ref 0.61–1.24)
GFR, Estimated: 47 mL/min — ABNORMAL LOW (ref >60)
Glucose, Bld: 98 mg/dL (ref 70–99)
Potassium: 5 mmol/L (ref 3.5–5.1)
Sodium: 138 mmol/L (ref 135–145)

## 2024-01-16 MED ORDER — ASPIRIN 81 MG PO CHEW: 81.0000 mg | CHEWABLE_TABLET | Freq: Every day | ORAL | 11 refills | Status: DC

## 2024-01-16 MED ORDER — DAPAGLIFLOZIN PROPANEDIOL 10 MG PO TABS: 10.0000 mg | ORAL_TABLET | Freq: Every day | ORAL | 11 refills | Status: AC

## 2024-01-16 MED ORDER — FUROSEMIDE 20 MG PO TABS: 20.0000 mg | ORAL_TABLET | Freq: Every day | ORAL | 11 refills | Status: AC

## 2024-01-16 MED ORDER — ROSUVASTATIN CALCIUM 10 MG PO TABS: 10.0000 mg | ORAL_TABLET | Freq: Every evening | ORAL | 11 refills | Status: AC

## 2024-01-16 MED ORDER — SACUBITRIL-VALSARTAN 24-26 MG PO TABS: 1.0000 | ORAL_TABLET | Freq: Two times a day (BID) | ORAL | 3 refills | Status: AC

## 2024-01-16 MED ORDER — SPIRONOLACTONE 25 MG PO TABS: 25.0000 mg | ORAL_TABLET | Freq: Every day | ORAL | 11 refills | Status: DC

## 2024-01-16 NOTE — Patient Instructions (Addendum)
 Thank you for coming in today  If you had labs drawn today, any labs that are abnormal the clinic will call you No news is good news  Medications: Increase Spironolactone  25 mg 1 tablet daily    Follow up appointments: Your physician recommends that you return for lab work in: 1 week BMET  Your physician recommends that you schedule a follow-up appointment in:  3 weeks in clinic 3 months With Dr. Bensimhon with echocardiogram Please call our office to schedule the follow-up appointment in September 2025 for November 2025.  Your physician has requested that you have an echocardiogram. Echocardiography is a painless test that uses sound waves to create images of your heart. It provides your doctor with information about the size and shape of your heart and how well your heart's chambers and valves are working. This procedure takes approximately one hour. There are no restrictions for this procedure.     Do the following things EVERYDAY: Weigh yourself in the morning before breakfast. Write it down and keep it in a log. Take your medicines as prescribed Eat low salt foods--Limit salt (sodium) to 2000 mg per day.  Stay as active as you can everyday Limit all fluids for the day to less than 2 liters   At the [facility], you and your health needs are our priority. As part of our continuing mission to provide you with exceptional heart care, we have created designated Provider Care Teams. These Care Teams include your primary Cardiologist (physician) and Advanced Practice Providers (APPs- Physician Assistants and Nurse Practitioners) who all work together to provide you with the care you need, when you need it.   You may see any of the following providers on your designated Care Team at your next follow up: Dr Toribio Fuel Dr Ezra Shuck Dr. Ria Gardenia Greig Lenetta, NP Caffie Shed, [place] [facility] [place], [place] Beckey Coe, NP Tinnie Redman,  PharmD   Please be sure to bring in all your medications bottles to every appointment.    Thank you for choosing [facility]  If you have any questions or concerns before your next appointment please send us  a message through [place] or call our office at [phone].    TO LEAVE A MESSAGE FOR THE NURSE SELECT OPTION 2, PLEASE LEAVE A MESSAGE INCLUDING: YOUR NAME DATE OF BIRTH CALL BACK NUMBER REASON FOR CALL**this is important as we prioritize the call backs  YOU WILL RECEIVE A CALL BACK THE SAME DAY AS LONG AS YOU CALL BEFORE 4:00 PM

## 2024-01-18 ENCOUNTER — Ambulatory Visit (INDEPENDENT_AMBULATORY_CARE_PROVIDER_SITE_OTHER): Admitting: Otolaryngology

## 2024-01-23 ENCOUNTER — Ambulatory Visit (HOSPITAL_COMMUNITY)
Admission: RE | Admit: 2024-01-23 | Discharge: 2024-01-23 | Disposition: A | Discharge status: Home / Self Care | Source: Ambulatory Visit | Attending: Cardiology | Admitting: Cardiology

## 2024-01-23 ENCOUNTER — Ambulatory Visit (HOSPITAL_COMMUNITY): Payer: Self-pay | Admitting: Family Medicine

## 2024-01-23 DIAGNOSIS — I5022 Chronic systolic (congestive) heart failure: Secondary | ICD-10-CM | POA: Diagnosis present

## 2024-01-23 LAB — BASIC METABOLIC PANEL WITH GFR
Anion gap: 10 (ref 5–15)
BUN: 21 mg/dL — ABNORMAL HIGH (ref 6–20)
CO2: 24 mmol/L (ref 22–32)
Calcium: 9.9 mg/dL (ref 8.9–10.3)
Chloride: 104 mmol/L (ref 98–111)
Creatinine, Ser: 1.28 mg/dL — ABNORMAL HIGH (ref 0.61–1.24)
GFR, Estimated: >60 mL/min (ref >60)
Glucose, Bld: 100 mg/dL — ABNORMAL HIGH (ref 70–99)
Potassium: 4.5 mmol/L (ref 3.5–5.1)
Sodium: 138 mmol/L (ref 135–145)

## 2024-01-23 NOTE — Progress Notes (Signed)
 Electrophysiology Office Note:    Date:  01/24/2024   ID:  Johnathan, Anderson Mar 13, 1966, MRN [id number]  PCP:  Leonel Cole, MD   [facility] Providers Cardiologist:  Stanly DELENA Leavens, MD     Referring MD: Leavens Stanly A*   History of Present Illness:    Johnathan Anderson is a 58 y.o. male with a medical history significant for bicuspid aortic valve with Bentall procedure November 2024, referred for device management.     He an elective AVR with Bentall procedure in November 2024.  There is concern for coronary ischemia so he underwent saphenous vein graft to the RCA and to the LAD.  Postop there was significant bleeding and VT arrest with biventricular shock requiring VA ECMO.  His symptoms send at the [facility] for transplant evaluation.  A Micra leadless pacemaker was placed prior to discharge due to intermittent complete heart block  Discussed the use of AI scribe software for clinical note transcription with the patient, who gave verbal consent to proceed.  History of Present Illness Johnathan Anderson is a 58 year old male with a history of aortic valve disease and a BENTAL procedure who presents for management of his Micra leadless pacemaker. He is referred for management of his Micra leadless pacemaker.  He has a bicuspid aortic valve and underwent a BENTAL procedure in November 2020, during which he experienced complications including cardiogenic shock, bleeding, ventricular fibrillation requiring VA ECMO support.  He was transferred to [facility] for possible transplant but his cardiac function recovered.  A Micra leadless pacemaker was placed due to intermittent complete heart block prior to discharge.  He experiences occasional palpitations and irregular beats, though not at the time of the visit. He also feels fatigued, which led to a hospital visit on August 6th.  Today, he is pacing dependent.  His paced QRS is greater than 160 ms.  The [employer] representative  has been trying to optimize his Micra AV but has not reliably been able to obtain AV synchrony.         Today, he reports that he is fatigued, constantly short of breath and has a constant cough.  EKGs/Labs/Other Studies Reviewed Today:     Echocardiogram:  TTE January 09, 2024 LVEF 35 to 40%.  Mild mitral valve regurgitation.  Aortic valve has been replaced.    EKG:   EKG Interpretation Date/Time:  Wednesday January 24 2024 15:54:09 EDT Ventricular Rate:  50 PR Interval:    QRS Duration:  166 QT Interval:  532 QTC Calculation: 485 R Axis:   136  Text Interpretation: Sinus rhythm with complete heart block and Ventricular-paced rhythm When compared with ECG of 16-Jan-2024 10:08, No significant change was found Confirmed by Nancey Scotts [phone]) on 01/24/2024 4:17:58 PM     Physical Exam:    VS:  BP (!) 100/50 (BP Location: Right Arm, Patient Position: Sitting, Cuff Size: Normal)   Pulse (!) 50   Ht 5' 6 (1.676 m)   Wt 181 lb (82.1 kg)   SpO2 98%   BMI 29.21 kg/m     Wt Readings from Last 3 Encounters:  01/24/24 181 lb (82.1 kg)  01/16/24 180 lb 3.2 oz (81.7 kg)  01/10/24 173 lb 12.8 oz (78.8 kg)     GEN: Well nourished, well developed in no acute distress CARDIAC: RRR, no murmurs, rubs, gallops RESPIRATORY:  Normal work of breathing MUSCULOSKELETAL: no edema    ASSESSMENT & PLAN:     Complete  heart block After complicated AVR and bypass resulting in cardiogenic shock Micra AV in place with failure to track the atrium I personally interrogated and reprogrammed the micra AV in an attempt to improve AV tracking  Cardiomyopathy Status post emergent CABG during Bentall procedure Myocardial perfusion study July 2025 shows fixed defect EF July 1 was 45 to 50%, August 5 35 to 40% I suspect RV pacing and AV synchrony is contributing to decline in EF I think upgrade to a transvenous system with CRT is appropriate I discussed the procedure with the patient and  his wife -- placement of a transvenous system would increase infection risk, but I think CHF is a more pressing problem The existing Micra may be explanted -- would use MAC for this  I discussed the indication for the procedure and the logistics, risks, potential benefit, and after care. I specifically explained that risks include but are not limited to infection, bleeding,damage to blood vessels, lung, and the heart -- but risk of prolonged hospitalization, need for surgery, or the event of stroke, heart attack, or death are low but not zero.    Bicuspid aortic valve Status post Bentall procedure   Signed, Eulas FORBES Furbish, MD  01/24/2024 4:32 PM    [facility]

## 2024-01-23 NOTE — H&P (View-Only) (Signed)
 Electrophysiology Office Note:    Date:  01/24/2024   ID:  Johnathan, Anderson Mar 13, 1966, MRN [id number]  PCP:  Johnathan Cole, MD   [facility] Providers Cardiologist:  Johnathan DELENA Leavens, MD     Referring MD: Johnathan Anderson*   History of Present Illness:    Johnathan Anderson is Anderson 58 y.o. male with Anderson medical history significant for bicuspid aortic valve with Bentall procedure November 2024, referred for device management.     He an elective AVR with Bentall procedure in November 2024.  There is concern for coronary ischemia so he underwent saphenous vein graft to the RCA and to the LAD.  Postop there was significant bleeding and VT arrest with biventricular shock requiring VA ECMO.  His symptoms send at the [facility] for transplant evaluation.  Anderson Micra leadless pacemaker was placed prior to discharge due to intermittent complete heart block  Discussed the use of AI scribe software for clinical note transcription with the patient, who gave verbal consent to proceed.  History of Present Illness Johnathan Anderson is Anderson 58 year old male with Anderson history of aortic valve disease and Anderson BENTAL procedure who presents for management of his Micra leadless pacemaker. He is referred for management of his Micra leadless pacemaker.  He has Anderson bicuspid aortic valve and underwent Anderson BENTAL procedure in November 2020, during which he experienced complications including cardiogenic shock, bleeding, ventricular fibrillation requiring VA ECMO support.  He was transferred to [facility] for possible transplant but his cardiac function recovered.  Anderson Micra leadless pacemaker was placed due to intermittent complete heart block prior to discharge.  He experiences occasional palpitations and irregular beats, though not at the time of the visit. He also feels fatigued, which led to Anderson hospital visit on August 6th.  Today, he is pacing dependent.  His paced QRS is greater than 160 ms.  The [employer] representative  has been trying to optimize his Micra AV but has not reliably been able to obtain AV synchrony.         Today, he reports that he is fatigued, constantly short of breath and has Anderson constant cough.  EKGs/Labs/Other Studies Reviewed Today:     Echocardiogram:  TTE January 09, 2024 LVEF 35 to 40%.  Mild mitral valve regurgitation.  Aortic valve has been replaced.    EKG:   EKG Interpretation Date/Time:  Wednesday January 24 2024 15:54:09 EDT Ventricular Rate:  50 PR Interval:    QRS Duration:  166 QT Interval:  532 QTC Calculation: 485 R Axis:   136  Text Interpretation: Sinus rhythm with complete heart block and Ventricular-paced rhythm When compared with ECG of 16-Jan-2024 10:08, No significant change was found Confirmed by Johnathan Anderson [phone]) on 01/24/2024 4:17:58 PM     Physical Exam:    VS:  BP (!) 100/50 (BP Location: Right Arm, Patient Position: Sitting, Cuff Size: Normal)   Pulse (!) 50   Ht 5' 6 (1.676 m)   Wt 181 lb (82.1 kg)   SpO2 98%   BMI 29.21 kg/m     Wt Readings from Last 3 Encounters:  01/24/24 181 lb (82.1 kg)  01/16/24 180 lb 3.2 oz (81.7 kg)  01/10/24 173 lb 12.8 oz (78.8 kg)     GEN: Well nourished, well developed in no acute distress CARDIAC: RRR, no murmurs, rubs, gallops RESPIRATORY:  Normal work of breathing MUSCULOSKELETAL: no edema    ASSESSMENT & PLAN:     Complete  heart block After complicated AVR and bypass resulting in cardiogenic shock Micra AV in place with failure to track the atrium I personally interrogated and reprogrammed the micra AV in an attempt to improve AV tracking  Cardiomyopathy Status post emergent CABG during Bentall procedure Myocardial perfusion study July 2025 shows fixed defect EF July 1 was 45 to 50%, August 5 35 to 40% I suspect RV pacing and AV synchrony is contributing to decline in EF I think upgrade to Anderson transvenous system with CRT is appropriate I discussed the procedure with the patient and  his wife -- placement of Anderson transvenous system would increase infection risk, but I think CHF is Anderson more pressing problem The existing Micra may be explanted -- would use MAC for this  I discussed the indication for the procedure and the logistics, risks, potential benefit, and after care. I specifically explained that risks include but are not limited to infection, bleeding,damage to blood vessels, lung, and the heart -- but risk of prolonged hospitalization, need for surgery, or the event of stroke, heart attack, or death are low but not zero.    Bicuspid aortic valve Status post Bentall procedure   Signed, Eulas FORBES Furbish, MD  01/24/2024 4:32 PM    [facility]

## 2024-01-24 ENCOUNTER — Ambulatory Visit: Attending: Cardiology | Admitting: Cardiovascular Disease

## 2024-01-24 ENCOUNTER — Encounter: Payer: Self-pay | Admitting: Cardiovascular Disease

## 2024-01-24 DIAGNOSIS — I459 Conduction disorder, unspecified: Secondary | ICD-10-CM | POA: Diagnosis present

## 2024-01-24 DIAGNOSIS — R002 Palpitations: Secondary | ICD-10-CM | POA: Diagnosis not present

## 2024-01-24 DIAGNOSIS — I5082 Biventricular heart failure: Secondary | ICD-10-CM | POA: Insufficient documentation

## 2024-01-24 NOTE — Patient Instructions (Signed)
 Medication Instructions:  Your physician recommends that you continue on your current medications as directed. Please refer to the Current Medication list given to you today.  *If you need a refill on your cardiac medications before your next appointment, please call your pharmacy*  Lab Work: None ordered today If you have labs (blood work) drawn today and your tests are completely normal, you will receive your results only by: MyChart Message (if you have MyChart) OR A paper copy in the mail If you have any lab test that is abnormal or we need to change your treatment, we will call you to review the results.  Testing/Procedures: Your physician has recommended that you have a pacemaker inserted. A pacemaker is a small device that is placed under the skin of your chest or abdomen to help control abnormal heart rhythms. This device uses electrical pulses to prompt the heart to beat at a normal rate. Pacemakers are used to treat heart rhythms that are too slow. Wire (leads) are attached to the pacemaker that goes into the chambers of you heart. This is done in the hospital and usually requires and overnight stay. Please see the instruction sheet given to you today for more information.  CRT or cardiac resynchronization therapy is a treatment used to correct heart failure. When you have heart failure your heart is weakened and doesn't pump as well as it should. This therapy may help reduce symptoms and improve the quality of life.  Please see the handout/brochure given to you today to get more information of the different options of therapy.   Follow-Up: At [facility], you and your health needs are our priority.  As part of our continuing mission to provide you with exceptional heart care, our providers are all part of one team.  This team includes your primary Cardiologist (physician) and Advanced Practice Providers or APPs (Physician Assistants and Nurse Practitioners) who all work together  to provide you with the care you need, when you need it.  Your next appointment:   To be determined  We recommend signing up for the patient portal called MyChart.  Sign up information is provided on this After Visit Summary.  MyChart is used to connect with patients for Virtual Visits (Telemedicine).  Patients are able to view lab/test results, encounter notes, upcoming appointments, etc.  Non-urgent messages can be sent to your provider as well.   To learn more about what you can do with MyChart, go to [email or web address].

## 2024-02-01 ENCOUNTER — Other Ambulatory Visit (HOSPITAL_COMMUNITY)

## 2024-02-02 ENCOUNTER — Encounter (HOSPITAL_COMMUNITY): Payer: Self-pay

## 2024-02-06 ENCOUNTER — Telehealth (HOSPITAL_COMMUNITY): Payer: Self-pay

## 2024-02-06 NOTE — Telephone Encounter (Signed)
 Called to confirm/remind patient of their appointment at the [facility] on 02/07/24.   Appointment:   [] Confirmed  [x] Left mess   [] No answer/No voice mail  [] VM Full/unable to leave message  [] Phone not in service  And to bring in all medications and/or complete list.

## 2024-02-07 ENCOUNTER — Encounter (HOSPITAL_COMMUNITY): Payer: Self-pay

## 2024-02-07 ENCOUNTER — Ambulatory Visit (HOSPITAL_COMMUNITY)
Admission: RE | Admit: 2024-02-07 | Discharge: 2024-02-07 | Disposition: A | Discharge status: Home / Self Care | Source: Ambulatory Visit | Attending: Family Medicine | Admitting: Family Medicine

## 2024-02-07 VITALS — BP 124/86 | HR 69 | Ht 66.0 in | Wt 184.2 lb

## 2024-02-07 DIAGNOSIS — Z87891 Personal history of nicotine dependence: Secondary | ICD-10-CM | POA: Insufficient documentation

## 2024-02-07 DIAGNOSIS — I455 Other specified heart block: Secondary | ICD-10-CM | POA: Insufficient documentation

## 2024-02-07 DIAGNOSIS — Z8679 Personal history of other diseases of the circulatory system: Secondary | ICD-10-CM | POA: Insufficient documentation

## 2024-02-07 DIAGNOSIS — I110 Hypertensive heart disease with heart failure: Secondary | ICD-10-CM | POA: Insufficient documentation

## 2024-02-07 DIAGNOSIS — I5022 Chronic systolic (congestive) heart failure: Secondary | ICD-10-CM | POA: Insufficient documentation

## 2024-02-07 DIAGNOSIS — Z951 Presence of aortocoronary bypass graft: Secondary | ICD-10-CM | POA: Insufficient documentation

## 2024-02-07 DIAGNOSIS — Z952 Presence of prosthetic heart valve: Secondary | ICD-10-CM | POA: Diagnosis not present

## 2024-02-07 DIAGNOSIS — K219 Gastro-esophageal reflux disease without esophagitis: Secondary | ICD-10-CM | POA: Insufficient documentation

## 2024-02-07 DIAGNOSIS — Z79899 Other long term (current) drug therapy: Secondary | ICD-10-CM | POA: Diagnosis not present

## 2024-02-07 DIAGNOSIS — Z950 Presence of cardiac pacemaker: Secondary | ICD-10-CM | POA: Insufficient documentation

## 2024-02-07 DIAGNOSIS — Z8774 Personal history of (corrected) congenital malformations of heart and circulatory system: Secondary | ICD-10-CM | POA: Insufficient documentation

## 2024-02-07 DIAGNOSIS — I10 Essential (primary) hypertension: Secondary | ICD-10-CM | POA: Diagnosis not present

## 2024-02-07 DIAGNOSIS — Z8674 Personal history of sudden cardiac arrest: Secondary | ICD-10-CM | POA: Diagnosis not present

## 2024-02-07 DIAGNOSIS — E785 Hyperlipidemia, unspecified: Secondary | ICD-10-CM | POA: Insufficient documentation

## 2024-02-07 DIAGNOSIS — Q2381 Bicuspid aortic valve: Secondary | ICD-10-CM | POA: Diagnosis not present

## 2024-02-07 DIAGNOSIS — I459 Conduction disorder, unspecified: Secondary | ICD-10-CM

## 2024-02-07 NOTE — Patient Instructions (Signed)
 There has been no changes to your medications.  Your physician has requested that you have an echocardiogram. Echocardiography is a painless test that uses sound waves to create images of your heart. It provides your doctor with information about the size and shape of your heart and how well your heart's chambers and valves are working. This procedure takes approximately one hour. There are no restrictions for this procedure. Please do NOT wear cologne, perfume, aftershave, or lotions (deodorant is allowed). Please arrive 15 minutes prior to your appointment time.  Please note: We ask at that you not bring children with you during ultrasound (echo/ vascular) testing. Due to room size and safety concerns, children are not allowed in the ultrasound rooms during exams. Our front office staff cannot provide observation of children in our lobby area while testing is being conducted. An adult accompanying a patient to their appointment will only be allowed in the ultrasound room at the discretion of the ultrasound technician under special circumstances. We apologize for any inconvenience.  Your physician recommends that you schedule a follow-up appointment in: 3 months with an echocardiogram ( December) ** PLEASE CALL THE OFFICE IN OCTOBER TO ARRANGE YOUR FOLLOW UP APPOINTMENT.**  If you have any questions or concerns before your next appointment please send us  a message through [place] or call our office at [phone].    TO LEAVE A MESSAGE FOR THE NURSE SELECT OPTION 2, PLEASE LEAVE A MESSAGE INCLUDING: YOUR NAME DATE OF BIRTH CALL BACK NUMBER REASON FOR CALL**this is important as we prioritize the call backs  YOU WILL RECEIVE A CALL BACK THE SAME DAY AS LONG AS YOU CALL BEFORE 4:00 PM  At the [facility], you and your health needs are our priority. As part of our continuing mission to provide you with exceptional heart care, we have created designated Provider Care Teams. These  Care Teams include your primary Cardiologist (physician) and Advanced Practice Providers (APPs- Physician Assistants and Nurse Practitioners) who all work together to provide you with the care you need, when you need it.   You may see any of the following providers on your designated Care Team at your next follow up: Dr Toribio Fuel Dr Ezra Shuck Dr. Ria Commander Dr. Morene Brownie Amy Lenetta, NP Caffie Shed, [place] [facility] [place], [place] Beckey Coe, NP [place] Lee, NP Ellouise Class, NP Tinnie Redman, PharmD Jaun Bash, PharmD   Please be sure to bring in all your medications bottles to every appointment.    Thank you for choosing [facility]

## 2024-02-07 NOTE — Progress Notes (Signed)
 ADVANCED HF CLINIC NOTE   PCP: Leonel Cole, MD Primary Cardiologist: Stanly DELENA Leavens, MD HF Cardiologist: Dr. Cherrie  HPI: 58 y.o. male with history of HFrEF, GERD, HTN, HLD, bicuspid aortic valve/ascending aortic aneurysm.   He presented for elective AVR with Bentall procedure in 11/24. There was significant difficulty coming off pump w/ concern for coronary ischemia.  Therefore, he underwent SVG to RCA and SVG to LAD. Postop he had significant bleeding, VT arrest and biventricular shock (RV predominant) requiring central VA ECMO cannulation. He was unable to wean from ECMO and was transferred to [facility] for transplant evaluation. He later had cardiac recovery and underwent ECMO decannulation. Had Micra leadless PPM placed prior to discharge d/t intermittent heart block.    He has been followed by Dr. Leavens. Echo 12/05/23: EF 45-50%, RV low normal, RVSP 61 mmHg, aortic valve prosthesis okay with mean gradient 12 mmHg. He had an abnormal stress MPI with large fixed mid to basal inferior defect. Underwent R/LHC showing patent native coronaries and bypass grafts, RA mean 18, PA 94/30, PCWP mean 29, TD CI 1.97. He was admitted from cath lab for IV diuresis and medical optimization. He responded well to IV diuretics and did not require inotrope support. GDMT initiated. Repeat echo showed EF 35-40%, RV moderately reduced, aortic valve prosthesis okay. RHC repeated prior to discharge to ensure euvolemia (RA 2, PAP 45/11 (18), PCWP 7, CI 2.3). He was discharged home after GDMT titration, weight 173 lbs.  Follow up with EP 8/25, suspect RV pacing and AV synchrony contributing to declining EF. Planning for CRT upgrade.  Today he returns for HF follow up. Overall feeling fine. Mild SOB walking further distances on flat ground. Denies palpitations, abnormal bleeding, CP, dizziness, edema, or PND/Orthopnea. Appetite ok. Weight at home 184 pounds. Taking all medications. He is on  disability.   Cardiac Studies - RHC 01/10/24: RA 2, PA 45/12 (19), PCWP 8, CO/CI (Fick) 4.28/2.27, PVR 2.57 WU, PAPi 11 - Ltd echo 01/09/24: EF 35-40%, RV moderately reduced - R/LHC 01/08/24: native coronaries and bypass grafts, RA mean 18, PA 94/30, PCWP mean 29, TD CI 1.97 - stress MPI 7/25: large fixed mid to basal inferior defect. - Echo 7/25: EF 45-50%, RV low normal, RVSP 61 mmHg, aortic valve prosthesis okay with mean gradient 12 mmHg.  Past Medical History:  Diagnosis Date   Aortic aneurysm (HCC)    Arthritis    Left Knee   Cancer (HCC) 2022   Skin Cancer   GERD (gastroesophageal reflux disease)    Headache    Migraines   Heart murmur    Hyperlipidemia    Hypertension    Peripheral vascular disease (HCC)    Thoracic Aortic Aneurysm   Vertigo    Current Outpatient Medications  Medication Sig Dispense Refill   albuterol  (VENTOLIN  HFA) 108 (90 Base) MCG/ACT inhaler Inhale 2 puffs into the lungs every 4 (four) hours as needed for wheezing or shortness of breath.     aspirin  81 MG chewable tablet Chew 1 tablet (81 mg total) by mouth at bedtime. 30 tablet 11   budesonide-formoterol (SYMBICORT) 160-4.5 MCG/ACT inhaler Inhale 2 puffs into the lungs 2 (two) times daily.     dapagliflozin  propanediol (FARXIGA ) 10 MG TABS tablet Take 1 tablet (10 mg total) by mouth daily. 30 tablet 11   furosemide  (LASIX ) 20 MG tablet Take 1 tablet (20 mg total) by mouth daily. 30 tablet 11   rosuvastatin  (CRESTOR ) 10 MG tablet Take 1 tablet (  10 mg total) by mouth every evening. 30 tablet 11   sacubitril -valsartan  (ENTRESTO ) 24-26 MG Take 1 tablet by mouth 2 (two) times daily. 60 tablet 3   spironolactone  (ALDACTONE ) 25 MG tablet Take 1 tablet (25 mg total) by mouth daily. 30 tablet 11   omeprazole  (PRILOSEC) 40 MG capsule Take 1 capsule (40 mg total) by mouth daily. (Patient not taking: Reported on 02/07/2024) 30 capsule 3   No current facility-administered medications for this encounter.   Allergies   Allergen Reactions   Levofloxacin Other (See Comments)    unknown  unsure-cardiologist stated allergy   Social History   Socioeconomic History   Marital status: Married    Spouse name: Not on file   Number of children: Not on file   Years of education: Not on file   Highest education level: Not on file  Occupational History   Not on file  Tobacco Use   Smoking status: Former    Types: Cigarettes   Smokeless tobacco: Never   Tobacco comments:    Pt quit smoking cigarettes in 2005. 1 pack of cigarettes per week.  Vaping Use   Vaping status: Never Used  Substance and Sexual Activity   Alcohol use: No   Drug use: No   Sexual activity: Yes  Other Topics Concern   Not on file  Social History Narrative   Not on file   Social Drivers of Health   Financial Resource Strain: Medium Risk (08/12/2022)   Overall Financial Resource Strain (CARDIA)    Difficulty of Paying Living Expenses: Somewhat hard  Food Insecurity: No Food Insecurity (01/08/2024)   Hunger Vital Sign    Worried About Running Out of Food in the Last Year: Never true    Ran Out of Food in the Last Year: Never true  Transportation Needs: No Transportation Needs (01/08/2024)   PRAPARE - Administrator, Civil Service (Medical): No    Lack of Transportation (Non-Medical): No  Physical Activity: Not on file  Stress: Not on file  Social Connections: Not on file  Intimate Partner Violence: Not At Risk (01/08/2024)   Humiliation, Afraid, Rape, and Kick questionnaire    Fear of Current or Ex-Partner: No    Emotionally Abused: No    Physically Abused: No    Sexually Abused: No   Family History  Problem Relation Age of Onset   Hypertension Mother    Diabetes Father    Migraines Neg Hx     Wt Readings from Last 3 Encounters:  02/07/24 83.6 kg (184 lb 3.2 oz)  01/24/24 82.1 kg (181 lb)  01/16/24 81.7 kg (180 lb 3.2 oz)   BP 124/86   Pulse 69   Ht 5' 6 (1.676 m)   Wt 83.6 kg (184 lb 3.2 oz)   SpO2  98%   BMI 29.73 kg/m   PHYSICAL EXAM: General:  NAD. No resp difficulty, walked into clinic HEENT: Normal Neck: Supple. No JVD. Cor: Regular rate & rhythm. No rubs, gallops or murmurs. Lungs: Clear Abdomen: Soft, nontender, nondistended.  Extremities: No cyanosis, clubbing, rash, edema Neuro: Alert & oriented x 3, moves all 4 extremities w/o difficulty. Affect pleasant.  ASSESSMENT & PLAN: Chronic HFmrEF - S/p AVR w/ Bentall procedure in 11/24. D/t concern for ischemia/difficulty weaning from bypass also had RSVG from aortic graft to RCA and SVG to LAD. Developed profound shock/BiV failure and required VA ECMO. Transferred to [facility] for transplant evaluation but ultimately had cardiac recovery. - Echo on  VA ECMO 11/24: EF < 20%, RV severely HK - Echo at [facility] 12/24: EF > 55%, RV okay, aortic valve prosthesis okay - Echo 12/05/23: EF 45-50%, RV low normal, RVSP 61 mmHg, aortic valve bioprosthesis okay - Ltd 01/09/24: EF 35-40% RV moderately reduced  - R/LHC 01/08/24: Native cors and bypasses are patent; RA 18, PA 94/30, PCWP 29,  CI 1.97 9Fick). - RHC 01/10/24: RA 3, PA 45/12 (19), PCWP 8, CO/CI (Fick) 4.28/2.27, PVR 2.57 WU, PAPi 11 - NYHA II, volume OK today. - Continue spiro 25 mg daily - Continue Lasix  20 mg daily - Continue Entresto  24/26 mg bid - Continue Farxiga  10 mg daily. - No beta blocker for now with recent low-output/bradycardia - Labs from 01/23/24 reviewed and are stable, K 4.5, creatinine 1.28 - Update echo in 2-3 months, after CRT upgrade.    2. Bicuspid aortic valve/ascending aortic aneurysm - S/p Bental as above   3. High grade heart block - S/p micra leadless pacemaker at [facility] 12/24 - Followed at [facility] - Planning CRT upgrade tomorrow with Dr. Nancey.   4. HTN - Stable.  - Meds as above  Follow up in 2-3 months with Dr. Cherrie + echo  Mead, FNP-BC 02/07/24

## 2024-02-08 ENCOUNTER — Other Ambulatory Visit: Payer: Self-pay

## 2024-02-08 ENCOUNTER — Encounter (HOSPITAL_COMMUNITY)
Admission: RE | Disposition: A | Discharge status: Home / Self Care | Source: Home / Self Care | Attending: Cardiovascular Disease

## 2024-02-08 ENCOUNTER — Ambulatory Visit (HOSPITAL_COMMUNITY)

## 2024-02-08 ENCOUNTER — Encounter (HOSPITAL_COMMUNITY): Payer: Self-pay

## 2024-02-08 ENCOUNTER — Ambulatory Visit (HOSPITAL_COMMUNITY)
Admission: RE | Admit: 2024-02-08 | Discharge: 2024-02-08 | Disposition: A | Discharge status: Home / Self Care | Attending: Cardiovascular Disease | Admitting: Cardiovascular Disease

## 2024-02-08 DIAGNOSIS — Q2381 Bicuspid aortic valve: Secondary | ICD-10-CM | POA: Insufficient documentation

## 2024-02-08 DIAGNOSIS — Z8674 Personal history of sudden cardiac arrest: Secondary | ICD-10-CM | POA: Insufficient documentation

## 2024-02-08 DIAGNOSIS — I509 Heart failure, unspecified: Secondary | ICD-10-CM | POA: Diagnosis not present

## 2024-02-08 DIAGNOSIS — I442 Atrioventricular block, complete: Secondary | ICD-10-CM | POA: Diagnosis not present

## 2024-02-08 DIAGNOSIS — Z951 Presence of aortocoronary bypass graft: Secondary | ICD-10-CM | POA: Diagnosis not present

## 2024-02-08 DIAGNOSIS — I429 Cardiomyopathy, unspecified: Secondary | ICD-10-CM | POA: Insufficient documentation

## 2024-02-08 HISTORY — PX: LEAD INSERTION: EP1212

## 2024-02-08 HISTORY — PX: PACEMAKER LEADLESS REMOVAL: EP1220

## 2024-02-08 HISTORY — PX: BIV PACEMAKER INSERTION CRT-P: EP1199

## 2024-02-08 SURGERY — PACEMAKER LEADLESS REMOVAL
Anesthesia: Monitor Anesthesia Care

## 2024-02-08 MED ORDER — MIDAZOLAM HCL 5 MG/5ML IJ SOLN
INTRAMUSCULAR | Status: DC | PRN
  Administered 2024-02-08 (×4): 1 mg via INTRAVENOUS

## 2024-02-08 MED ORDER — FENTANYL CITRATE (PF) 100 MCG/2ML IJ SOLN
INTRAMUSCULAR | Status: AC
  Filled 2024-02-08: qty 2

## 2024-02-08 MED ORDER — SODIUM CHLORIDE 0.9 % IV SOLN: 80.0000 mg | INTRAVENOUS | Status: AC

## 2024-02-08 MED ORDER — CHLORHEXIDINE GLUCONATE 4 % EX SOLN
4.0000 | Freq: Once | CUTANEOUS | Status: AC
  Administered 2024-02-08: 4 via TOPICAL
  Filled 2024-02-08: qty 60

## 2024-02-08 MED ORDER — LIDOCAINE HCL 1 % IJ SOLN
INTRAMUSCULAR | Status: AC
  Filled 2024-02-08: qty 60

## 2024-02-08 MED ORDER — ONDANSETRON HCL 4 MG/2ML IJ SOLN: 4.0000 mg | Freq: Four times a day (QID) | INTRAMUSCULAR | Status: DC | PRN

## 2024-02-08 MED ORDER — SODIUM CHLORIDE 0.9 % IV SOLN
INTRAVENOUS | Status: AC
  Administered 2024-02-08: 80 mg
  Filled 2024-02-08: qty 2

## 2024-02-08 MED ORDER — CEFAZOLIN SODIUM-DEXTROSE 2-4 GM/100ML-% IV SOLN: 2.0000 g | INTRAVENOUS | Status: AC

## 2024-02-08 MED ORDER — ACETAMINOPHEN 325 MG PO TABS
325.0000 mg | ORAL_TABLET | ORAL | Status: DC | PRN
  Administered 2024-02-08: 650 mg via ORAL
  Filled 2024-02-08: qty 2

## 2024-02-08 MED ORDER — POVIDONE-IODINE 10 % EX SWAB
2.0000 | Freq: Once | CUTANEOUS | Status: AC
  Administered 2024-02-08: 2 via TOPICAL

## 2024-02-08 MED ORDER — ASPIRIN 81 MG PO CHEW: 81.0000 mg | CHEWABLE_TABLET | Freq: Every day | ORAL | Status: AC

## 2024-02-08 MED ORDER — LIDOCAINE HCL (PF) 1 % IJ SOLN
INTRAMUSCULAR | Status: DC | PRN
  Administered 2024-02-08: 45 mL

## 2024-02-08 MED ORDER — CEFAZOLIN SODIUM-DEXTROSE 2-4 GM/100ML-% IV SOLN
INTRAVENOUS | Status: AC
  Administered 2024-02-08: 2 g via INTRAVENOUS
  Filled 2024-02-08: qty 100

## 2024-02-08 MED ORDER — SODIUM CHLORIDE 0.9 % IV SOLN: INTRAVENOUS | Status: DC

## 2024-02-08 MED ORDER — FENTANYL CITRATE (PF) 100 MCG/2ML IJ SOLN
INTRAMUSCULAR | Status: DC | PRN
  Administered 2024-02-08: 50 ug via INTRAVENOUS
  Administered 2024-02-08 (×2): 25 ug via INTRAVENOUS

## 2024-02-08 MED ORDER — MIDAZOLAM HCL 5 MG/5ML IJ SOLN
INTRAMUSCULAR | Status: AC
  Filled 2024-02-08: qty 5

## 2024-02-08 MED ORDER — IOHEXOL 350 MG/ML SOLN
INTRAVENOUS | Status: DC | PRN
  Administered 2024-02-08: 25 mL

## 2024-02-08 SURGICAL SUPPLY — 21 items
BALLOON ATTAIN 80 (BALLOONS) IMPLANT
CABLE SURGICAL S-101-97-12 (CABLE) ×1 IMPLANT
CATH ATTAIN COM SURV 6250V-MB2 (CATHETERS) IMPLANT
CATH ATTAIN SEL SURV 6248V-130 (CATHETERS) IMPLANT
CATH RIGHTSITE C315HIS02 (CATHETERS) IMPLANT
DEVICE CRTP PERCEPTA QUAD MRI (Pacemaker) IMPLANT
KIT ESSENTIALS PG (KITS) IMPLANT
LEAD ATTAIN PERFORM ST 4398-88 (Lead) IMPLANT
LEAD ATTAIN PERFORMA S 4598-88 (Lead) IMPLANT
LEAD CAPSURE NOVUS 5076-52CM (Lead) IMPLANT
LEAD SELECT SECURE 3830 383069 (Lead) IMPLANT
PAD DEFIB RADIO PHYSIO CONN (PAD) ×1 IMPLANT
SHEATH 7FR PRELUDE SNAP 13 (SHEATH) IMPLANT
SHEATH 9FR PRELUDE SNAP 13 (SHEATH) IMPLANT
SHEATH PROBE COVER 6X72 (BAG) IMPLANT
SLITTER 6232ADJ (MISCELLANEOUS) IMPLANT
TRAY PACEMAKER INSERTION (PACKS) ×1 IMPLANT
WIRE ACUITY WHISPER EDS 4648 (WIRE) IMPLANT
WIRE HI TORQ VERSACORE-J 145CM (WIRE) IMPLANT
WIRE MAILMAN 182CM (WIRE) IMPLANT
WIRE MICRO SET SILHO 5FR 7 (SHEATH) IMPLANT

## 2024-02-08 NOTE — Interval H&P Note (Signed)
 History and Physical Interval Note:  02/08/2024 11:55 AM  Pranav F Donahoe  has presented today for surgery, with the diagnosis of heart failure.  The various methods of treatment have been discussed with the patient and family. After consideration of risks, benefits and other options for treatment, the patient has consented to  Procedure(s): PACEMAKER LEADLESS REMOVAL (N/A) BIV PACEMAKER INSERTION CRT-P (N/A) LEAD INSERTION (N/A) as a surgical intervention.  The patient's history has been reviewed, patient examined, no change in status, stable for surgery.  I have reviewed the patient's chart and labs.  Questions were answered to the patient's satisfaction.    After discussion with patient, we will plan to leave the Micra Av in place, turned off, as a back up.  Bellarose Burtt E Holden Draughon

## 2024-02-08 NOTE — Discharge Instructions (Signed)
 After Your ICD (Implantable Cardiac Defibrillator)   You have a Medtronic ICD  If you have a Medtronic or Biotronik device, plug in your home monitor once you get home, and no manual interaction is required.   If you have an [employer] or [employer] device, plug your home monitor once you get home, sit near the device, and press the large activation button. Sit nearby until the process is complete, usually notated by lights on the monitor.   If you were set up for monitoring using an app on your phone, make sure the app remains open in the background and the Bluetooth remains on.  ACTIVITY Do not lift your arm above shoulder height for 1 week after your procedure. After 7 days, you may progress as below.  You should remove your sling 24 hours after your procedure, unless otherwise instructed by your provider.     Thursday February 15, 2024  Friday February 16, 2024 Saturday February 17, 2024 Sunday February 18, 2024   Do not lift, push, pull, or carry anything over 10 pounds with the affected arm until 6 weeks (Thursday March 21, 2024 ) after your procedure.   You may drive AFTER your wound check, unless you have been told otherwise by your provider.   Ask your healthcare provider when you can go back to work   INCISION/Dressing If you are on a blood thinner such as Coumadin, Xarelto, Eliquis, Plavix, or Pradaxa please confirm with your provider when this should be resumed. ***  If large square, outer bandage is left in place, this can be removed after 24 hours from your procedure. Do not remove steri-strips or glue as below.   Monitor your defibrillator site for redness, swelling, and drainage. Call the device clinic at [phone] if you experience these symptoms or fever/chills.  If your incision is sealed with Steri-strips or staples, you may shower 7 days after your procedure or when told by your provider. Do not remove the steri-strips or let the shower hit directly  on your site. You may wash around your site with soap and water .    If you were discharged in a sling, please do not wear this during the day more than 48 hours after your surgery unless otherwise instructed. This may increase the risk of stiffness and soreness in your shoulder.   Avoid lotions, ointments, or perfumes over your incision until it is well-healed.  You may use a hot tub or a pool AFTER your wound check appointment if the incision is completely closed.  Your ICD is designed to protect you from life threatening heart rhythms. Because of this, you may receive a shock.   1 shock with no symptoms:  Call the office during business hours. 1 shock with symptoms (chest pain, chest pressure, dizziness, lightheadedness, shortness of breath, overall feeling unwell):  Call 911. If you experience 2 or more shocks in 24 hours:  Call 911. If you receive a shock, you should not drive for 6 months per the [place] DMV IF you receive appropriate therapy from your ICD.   ICD Alerts:  Some alerts are vibratory and others beep. These are NOT emergencies. Please call our office to let us  know. If this occurs at night or on weekends, it can wait until the next business day. Send a remote transmission.  If your device is capable of reading fluid status (for heart failure), you will be offered monthly monitoring to review this with you.   DEVICE MANAGEMENT Remote monitoring  is used to monitor your ICD from home. This monitoring is scheduled every 91 days by our office. It allows us  to keep an eye on the functioning of your device to ensure it is working properly. You will routinely see your Electrophysiologist annually (more often if necessary). This will appear as a REMOTE check on your MyChart schedule. These are automatic and there is nothing for you to manually do unless otherwise instructed.  You should receive your ID card for your new device in 4-8 weeks. Keep this card with you at all times once  received. Consider wearing a medical alert bracelet or necklace.  Your ICD  may be MRI compatible. This will be discussed at your next office visit/wound check.  You should avoid contact with strong electric or magnetic fields.   Do not use amateur (ham) radio equipment or electric (arc) welding torches. MP3 player headphones with magnets should not be used. Some devices are safe to use if held at least 12 inches (30 cm) from your defibrillator. These include power tools, lawn mowers, and speakers. If you are unsure if something is safe to use, ask your health care provider.  When using your cell phone, hold it to the ear that is on the opposite side from the defibrillator. Do not leave your cell phone in a pocket over the defibrillator.  You may safely use electric blankets, heating pads, computers, and microwave ovens.  Call the office right away if: You have chest pain. You feel more than one shock. You feel more short of breath than you have felt before. You feel more light-headed than you have felt before. Your incision starts to open up.  This information is not intended to replace advice given to you by your health care provider. Make sure you discuss any questions you have with your health care provider.

## 2024-02-08 NOTE — Progress Notes (Signed)
 Patient is ready for discharge. Procedural site is intact. Xray resulted and MD cleared patient for discharge

## 2024-02-08 NOTE — Anesthesia Preprocedure Evaluation (Deleted)
 Anesthesia Evaluation  Patient identified by MRN, date of birth, ID band Patient awake    Reviewed: Allergy & Precautions, NPO status , Patient's Chart, lab work & pertinent test results  Airway        Dental   Pulmonary former smoker          Cardiovascular hypertension, + CABG, + Peripheral Vascular Disease and +CHF  + Valvular Problems/Murmurs (AVR)   TTE 2025 1. Global hypokinesis worse in septum/apex. Left ventricular ejection  fraction, by estimation, is 35 to 40%. Left ventricular ejection fraction  by 2D MOD biplane is 37.9 %. The left ventricle has moderately decreased  function. The left ventricular  internal cavity size was mildly dilated.   2. Right ventricular systolic function is moderately reduced. The right  ventricular size is mildly enlarged. Tricuspid regurgitation signal is  inadequate for assessing PA pressure.   3. The mitral valve is abnormal. Mild mitral valve regurgitation.   4. The aortic valve has been repaired/replaced. Aortic valve  regurgitation is not visualized. There is a 23 mm Konect Resilia Conduit  valve present in the aortic position. Procedure Date: 04/26/23. Echo  findings are consistent with normal structure and  function of the aortic valve prosthesis.   5. The inferior vena cava is normal in size with greater than 50%  respiratory variability, suggesting right atrial pressure of 3 mmHg.     Neuro/Psych  Headaches  negative psych ROS   GI/Hepatic Neg liver ROS,GERD  ,,  Endo/Other  negative endocrine ROS    Renal/GU negative Renal ROS  negative genitourinary   Musculoskeletal  (+) Arthritis ,    Abdominal   Peds  Hematology negative hematology ROS (+)   Anesthesia Other Findings 58 y.o. male with history of HFrEF, GERD, HTN, HLD, bicuspid aortic valve/ascending aortic aneurysm.    He presented for elective AVR with Bentall procedure in 11/24. There was significant  difficulty coming off pump w/ concern for coronary ischemia.  Therefore, he underwent SVG to RCA and SVG to LAD. Postop he had significant bleeding, VT arrest and biventricular shock (RV predominant) requiring central VA ECMO cannulation. He was unable to wean from ECMO and was transferred to [facility] for transplant evaluation. He later had cardiac recovery and underwent ECMO decannulation. Had Micra leadless PPM placed prior to discharge d/t intermittent heart block.    Reproductive/Obstetrics                              Anesthesia Physical Anesthesia Plan  ASA: 3  Anesthesia Plan: MAC   Post-op Pain Management:    Induction: Intravenous  PONV Risk Score and Plan: 1 and Propofol  infusion, Treatment may vary due to age or medical condition, Midazolam  and Ondansetron   Airway Management Planned: Natural Airway and Simple Face Mask  Additional Equipment:   Intra-op Plan:   Post-operative Plan:   Informed Consent: I have reviewed the patients History and Physical, chart, labs and discussed the procedure including the risks, benefits and alternatives for the proposed anesthesia with the patient or authorized representative who has indicated his/her understanding and acceptance.     Dental advisory given  Plan Discussed with: CRNA  Anesthesia Plan Comments: (This case was moderate sedation. )         Anesthesia Quick Evaluation

## 2024-02-09 ENCOUNTER — Encounter (HOSPITAL_COMMUNITY): Payer: Self-pay | Admitting: Cardiovascular Disease

## 2024-02-11 NOTE — Progress Notes (Deleted)
  Cardiology Office Note   Date:  02/11/2024  ID:  Bren, Steers 1966/02/16, MRN [id number] PCP: Leonel Cole, MD  [facility] Providers Cardiologist:  Stanly DELENA Leavens, MD   History of Present Illness Johnathan Anderson is a 58 y.o. male with past medical history of bicuspid aortic valve with Bentall procedure November 2024 who was referred to EP for device management and recently seen in the hospital.  Patient's medical history includes an elective AVR and Bentall procedure November 2024.  There was concern for coronary ischemia so he underwent saphenous vein graft to RCA and LAD.  Postop there was significant bleeding and VT arrest with biventricular shock required VA ECMO.  His symptoms sent him to [facility] for transplant evaluation.  A Micra leadless pacemaker was placed prior to discharge due to intermittent complete heart block.  He experienced occasional palpitations and irregular beats though not at the time of the visit.  Also felt fatigue which led to a hospital visit on August 6.  He was pacer dependent.  He was not able to obtain AV synchrony.  His device was interrogated and reprogrammed by Dr. Nancey.  Echocardiogram on July 1 showed LVEF 45 to 50%, repeat echocardiogram August 5 showed LVEF 35 to 40%.  RV pacing and AV synchrony was contributing to the decline in EF.  He was upgraded to a transvenous system with CRT.  Today, he ***  ROS: ***  Studies Reviewed      *** Risk Assessment/Calculations {Does this patient have ATRIAL FIBRILLATION?:[phone]} No BP recorded.  {Refresh Note OR Click here to enter BP  :1}***       Physical Exam VS:  There were no vitals taken for this visit.       Wt Readings from Last 3 Encounters:  02/08/24 185 lb (83.9 kg)  02/07/24 184 lb 3.2 oz (83.6 kg)  01/24/24 181 lb (82.1 kg)    GEN: Well nourished, well developed in no acute distress NECK: No JVD; No carotid bruits CARDIAC: ***RRR, no murmurs, rubs,  gallops RESPIRATORY:  Clear to auscultation without rales, wheezing or rhonchi  ABDOMEN: Soft, non-tender, non-distended EXTREMITIES:  No edema; No deformity   ASSESSMENT AND PLAN Complete heart block Bicuspid aortic valve and ascending aortic aneurysm status post AVR and Bentall procedure Cardiomyopathy HTN    {Are you ordering a CV Procedure (e.g. stress test, cath, DCCV, TEE, etc)?   Press F2        :[id number]}  Dispo: ***  Signed, Orren LOISE Fabry, PA-C

## 2024-02-12 ENCOUNTER — Ambulatory Visit: Attending: Physician Assistant | Admitting: Physician Assistant

## 2024-02-12 DIAGNOSIS — I5022 Chronic systolic (congestive) heart failure: Secondary | ICD-10-CM

## 2024-02-12 DIAGNOSIS — I10 Essential (primary) hypertension: Secondary | ICD-10-CM

## 2024-02-12 DIAGNOSIS — I459 Conduction disorder, unspecified: Secondary | ICD-10-CM

## 2024-02-12 DIAGNOSIS — R002 Palpitations: Secondary | ICD-10-CM

## 2024-02-12 DIAGNOSIS — I7121 Aneurysm of the ascending aorta, without rupture: Secondary | ICD-10-CM

## 2024-02-12 DIAGNOSIS — Q2381 Bicuspid aortic valve: Secondary | ICD-10-CM

## 2024-02-12 DIAGNOSIS — Z952 Presence of prosthetic heart valve: Secondary | ICD-10-CM

## 2024-02-15 ENCOUNTER — Ambulatory Visit (INDEPENDENT_AMBULATORY_CARE_PROVIDER_SITE_OTHER): Admitting: Otolaryngology

## 2024-02-16 ENCOUNTER — Telehealth (HOSPITAL_COMMUNITY): Payer: Self-pay | Admitting: Internal Medicine

## 2024-02-20 ENCOUNTER — Ambulatory Visit: Attending: Cardiovascular Disease

## 2024-02-20 DIAGNOSIS — I5022 Chronic systolic (congestive) heart failure: Secondary | ICD-10-CM | POA: Insufficient documentation

## 2024-02-20 DIAGNOSIS — I459 Conduction disorder, unspecified: Secondary | ICD-10-CM | POA: Diagnosis not present

## 2024-02-20 LAB — CUP PACEART INCLINIC DEVICE CHECK
Battery Remaining Longevity: 112 mo
Battery Voltage: 3.2 V
Brady Statistic AP VP Percent: 59.99 %
Brady Statistic AP VS Percent: 0.01 %
Brady Statistic AS VP Percent: 39.98 %
Brady Statistic AS VS Percent: 0.02 %
Brady Statistic RA Percent Paced: 59.96 %
Brady Statistic RV Percent Paced: 99.97 %
Date Time Interrogation Session: 20250916101051
Implantable Lead Connection Status: 753985
Implantable Lead Connection Status: 753985
Implantable Lead Connection Status: 753985
Implantable Lead Implant Date: 20250904
Implantable Lead Implant Date: 20250904
Implantable Lead Implant Date: 20250904
Implantable Lead Location: 753858
Implantable Lead Location: 753859
Implantable Lead Location: 753860
Implantable Lead Model: 3830
Implantable Lead Model: 4398
Implantable Lead Model: 5076
Implantable Pulse Generator Implant Date: 20250904
Lead Channel Impedance Value: 228 Ohm
Lead Channel Impedance Value: 304 Ohm
Lead Channel Impedance Value: 323 Ohm
Lead Channel Impedance Value: 342 Ohm
Lead Channel Impedance Value: 418 Ohm
Lead Channel Impedance Value: 437 Ohm
Lead Channel Impedance Value: 456 Ohm
Lead Channel Impedance Value: 456 Ohm
Lead Channel Impedance Value: 456 Ohm
Lead Channel Impedance Value: 494 Ohm
Lead Channel Impedance Value: 532 Ohm
Lead Channel Impedance Value: 551 Ohm
Lead Channel Impedance Value: 665 Ohm
Lead Channel Impedance Value: 741 Ohm
Lead Channel Pacing Threshold Amplitude: 0.75 V
Lead Channel Pacing Threshold Amplitude: 0.875 V
Lead Channel Pacing Threshold Amplitude: 1 V
Lead Channel Pacing Threshold Pulse Width: 0.4 ms
Lead Channel Pacing Threshold Pulse Width: 0.4 ms
Lead Channel Pacing Threshold Pulse Width: 0.4 ms
Lead Channel Sensing Intrinsic Amplitude: 0 mV
Lead Channel Sensing Intrinsic Amplitude: 2.625 mV
Lead Channel Sensing Intrinsic Amplitude: 2.75 mV
Lead Channel Sensing Intrinsic Amplitude: 9.25 mV
Lead Channel Sensing Intrinsic Amplitude: 9.25 mV
Lead Channel Setting Pacing Amplitude: 2.5 V
Lead Channel Setting Pacing Amplitude: 3.5 V
Lead Channel Setting Pacing Amplitude: 3.5 V
Lead Channel Setting Pacing Pulse Width: 0.4 ms
Lead Channel Setting Pacing Pulse Width: 0.4 ms
Lead Channel Setting Sensing Sensitivity: 0.9 mV
Zone Setting Status: 755011
Zone Setting Status: 755011

## 2024-02-20 NOTE — Progress Notes (Signed)
 Normal multi chamber pacemaker wound check. Presenting rhythm: AS/BP 73 . Wound well healed. Routine testing performed. Thresholds, sensing, and impedance consistent with implant measurements and at 3.5V safety margin/auto capture until 3 month visit. No episodes. Reviewed arm restrictions to continue for 6 weeks total post op.  Will enroll in remote follow-up-currently remotes are going to [facility].

## 2024-02-20 NOTE — Patient Instructions (Signed)

## 2024-02-24 ENCOUNTER — Ambulatory Visit: Payer: Self-pay | Admitting: Cardiovascular Disease

## 2024-02-27 ENCOUNTER — Telehealth (INDEPENDENT_AMBULATORY_CARE_PROVIDER_SITE_OTHER): Payer: Self-pay | Admitting: Otolaryngology

## 2024-02-27 NOTE — Telephone Encounter (Signed)
 LVM for patient to call back to reschedule 10/17 appointment.  Dr. Okey not in office.

## 2024-03-13 ENCOUNTER — Ambulatory Visit: Admitting: Physician Assistant

## 2024-03-22 ENCOUNTER — Ambulatory Visit (INDEPENDENT_AMBULATORY_CARE_PROVIDER_SITE_OTHER): Admitting: Otolaryngology

## 2024-03-27 ENCOUNTER — Telehealth (INDEPENDENT_AMBULATORY_CARE_PROVIDER_SITE_OTHER): Payer: Self-pay | Admitting: Otolaryngology

## 2024-03-27 NOTE — Telephone Encounter (Signed)
 Left vm to r/s 04/19/24 appointment

## 2024-04-19 ENCOUNTER — Ambulatory Visit (INDEPENDENT_AMBULATORY_CARE_PROVIDER_SITE_OTHER): Admitting: Otolaryngology

## 2024-05-06 ENCOUNTER — Encounter: Payer: Self-pay | Admitting: Physician Assistant

## 2024-05-06 ENCOUNTER — Ambulatory Visit: Attending: Physician Assistant | Admitting: Physician Assistant

## 2024-05-06 VITALS — BP 116/72 | HR 72 | Ht 66.0 in | Wt 188.0 lb

## 2024-05-06 DIAGNOSIS — B029 Zoster without complications: Secondary | ICD-10-CM | POA: Diagnosis present

## 2024-05-06 DIAGNOSIS — Z952 Presence of prosthetic heart valve: Secondary | ICD-10-CM | POA: Insufficient documentation

## 2024-05-06 DIAGNOSIS — I459 Conduction disorder, unspecified: Secondary | ICD-10-CM | POA: Diagnosis present

## 2024-05-06 DIAGNOSIS — I5020 Unspecified systolic (congestive) heart failure: Secondary | ICD-10-CM | POA: Diagnosis not present

## 2024-05-06 DIAGNOSIS — I10 Essential (primary) hypertension: Secondary | ICD-10-CM | POA: Insufficient documentation

## 2024-05-06 DIAGNOSIS — Q2381 Bicuspid aortic valve: Secondary | ICD-10-CM | POA: Diagnosis not present

## 2024-05-06 MED ORDER — VALACYCLOVIR HCL 1 G PO TABS: 1000.0000 mg | ORAL_TABLET | Freq: Three times a day (TID) | ORAL | 0 refills | Status: AC

## 2024-05-06 MED ORDER — SPIRONOLACTONE 25 MG PO TABS: 12.5000 mg | ORAL_TABLET | Freq: Every day | ORAL | 3 refills | Status: AC

## 2024-05-06 NOTE — Patient Instructions (Addendum)
 Medication Instructions:  Restart Lasix  (Furosemide ) 12.5 mg daily Start Valtrex  1,000 mg- take 1 tablet three times daily  *If you need a refill on your cardiac medications before your next appointment, please call your pharmacy*  Lab Work: BMET today  Testing/Procedures: Your physician has requested that you have an echocardiogram. Echocardiography is a painless test that uses sound waves to create images of your heart. It provides your doctor with information about the size and shape of your heart and how well your heart's chambers and valves are working. This procedure takes approximately one hour. There are no restrictions for this procedure. Please do NOT wear cologne, perfume, aftershave, or lotions (deodorant is allowed). Please arrive 15 minutes prior to your appointment time.  Please note: We ask at that you not bring children with you during ultrasound (echo/ vascular) testing. Due to room size and safety concerns, children are not allowed in the ultrasound rooms during exams. Our front office staff cannot provide observation of children in our lobby area while testing is being conducted. An adult accompanying a patient to their appointment will only be allowed in the ultrasound room at the discretion of the ultrasound technician under special circumstances. We apologize for any inconvenience.   Follow-Up: At [facility], you and your health needs are our priority.  As part of our continuing mission to provide you with exceptional heart care, our providers are all part of one team.  This team includes your primary Cardiologist (physician) and Advanced Practice Providers or APPs (Physician Assistants and Nurse Practitioners) who all work together to provide you with the care you need, when you need it.  Your next appointment:   6 month(s)  Provider:   Stanly DELENA Leavens, MD    We recommend signing up for the patient portal called MyChart.  Sign up information is  provided on this After Visit Summary.  MyChart is used to connect with patients for Virtual Visits (Telemedicine).  Patients are able to view lab/test results, encounter notes, upcoming appointments, etc.  Non-urgent messages can be sent to your provider as well.   To learn more about what you can do with MyChart, go to [email or web address].   Other Instructions  Please call your Primary Care doctor to be seen within 1 week.

## 2024-05-06 NOTE — Assessment & Plan Note (Signed)
The patient's blood pressure is controlled on his current regimen.  Continue current therapy.  

## 2024-05-06 NOTE — Assessment & Plan Note (Addendum)
 S/p Bentall procedure with aortic root replacement and bioprosthetic AVR in 04/2023. He had difficulty coming off bypass and due to concerns for cardiac ischemia, he underwent CABG w S-RCA and S-LAD. His post op course was c/b VF arrest, biventricular failure and required VA ECMO. He was transferred to [facility] for transplant but ultimately had cardiac recovery and was taken off ECMO. A R and L cardiac catheterization in 01/2024 demonstrated no CAD in native coronary arteries and patent SVGs. TTE in 12/2023 showed normally functioning AVR.  - Continue SBE prophylaxis

## 2024-05-06 NOTE — Progress Notes (Signed)
 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)  EF 04/2023: < 20 TTE 12/05/2023: EF 45-50, global HK, low normal RVSF, severely elevated PASP (RVSP 61.4), mild-moderate LAE, mild RAE, mild MR, AVR with mean gradient 12, no PVL, small pericardial effusion Lexiscan  MPI 12/05/2023: Inferior infarct, no ischemia, EF 56, intermediate risk R/L LHC 01/08/2024: Patent native coronary arteries, patent SVG-RCA, patent SVG-LAD; mean PAP 49 (severe pulmonary hypertension), mean PCWP 29 Limited TTE 01/09/2024: EF 35-40, moderately reduced RVSF, mild MR, AVR with normal structure and function RHC 01/10/2024: Mean PCWP 8, mean PA 19 (significantly improved filling pressures and PA pressures); Fick CO 4.28, thermodilution CO 3.4 S/p Medtronic CRT-P 02/2024 Hypertension  Hyperlipidemia  GERD         Discussed the use of AI scribe software for clinical note transcription with the patient, who gave verbal consent to proceed. History of Present Illness Johnathan Anderson is a 58 y.o. male for follow up of CHF.   He underwent Bentall procedure with aortic root replacement and AVR in 04/2023 which was complicated by possible cardiac ischemia requiring CABG x 2, biventricular shock requiring ECMO and ultimate transfer to [facility] for Transplant. He ultimately had recovery and was taken off ECMO. He had  a leadless pacer implanted for intermittent HB.   He was last seen by Dr. Leavens in 12/2023. He had been experiencing worsening chest pain and shortness of breath. A follow up TTE in 12/2023 that demonstrated severe pulmonary hypertension, normally functioning AVR and EF 45-50. MPI demonstrated inf scar, no ischemia. He was set up for R and L cardiac catheterization which demonstrated severe pulmonary hypertension, elevated filling pressures. He was admitted for diuresis and med optimization. He did not require inotropes. Repeat TTE showed EF 35-40, mod RV dysfunction. His hemodynamics improved significantly on repeat RHC.    He had upgrade to CRT-P in Sept 2025.   Pt was last seen in CHF clinic 02/2024.   He reports significant improvement in energy levels and no longer experiences shortness of breath, swelling, or orthopnea. He can climb a flight of stairs without stopping and does not require propping up on pillows at night. No recent episodes of syncope.  He has noticed a rash on his left chest, which started about a week to ten days ago. The rash began as small bumps and has increased in number, causing discomfort with touch and coughing. No tingling or burning prior to the rash's appearance and no new lesions in the past few days.    ROS-See HPI    Studies Reviewed:      Results LABS Potassium: 4.5 (01/23/2024) Creatinine: 1.28 (01/23/2024) EGFR: >60 (01/23/2024) Hemoglobin: 11 (01/23/2024) LDL: 108 (11/21/2023)          Physical Exam:  VS:  BP 116/72 (BP Location: Left Arm, Patient Position: Sitting, Cuff Size: Normal)  Pulse 72   Ht 5' 6 (1.676 m)   Wt 188 lb (85.3 kg)   SpO2 99%   BMI 30.34 kg/m        Wt Readings from Last 3 Encounters:  05/06/24 188 lb (85.3 kg)  02/08/24 185 lb (83.9 kg)  02/07/24 184 lb 3.2 oz (83.6 kg)    Constitutional:      Appearance: Healthy appearance. Not in distress.  Neck:     Vascular: JVD normal.  Pulmonary:     Breath  sounds: Normal breath sounds. No wheezing. No rales.  Cardiovascular:     Normal rate. Regular rhythm.     Murmurs: There is a grade 2/6 systolic murmur at the ULSB.  Edema:    Peripheral edema absent.  Abdominal:     Palpations: Abdomen is soft.  Skin:    Comments: Vesicular rash in T4-T5 dermatome on the L c/w Herpes Zoster       Assessment and Plan:    Assessment & Plan Heart failure with mildly reduced ejection fraction (HFmrEF, 41-49%) (HCC) He had biventricular failure with EF < 20 at the time of his Bentall and required ECMO. He was transferred to [facility] for transplant but had recovery and was ultimately taken off ECMO. He recently was admitted in 01/2024 with volume overload. Limited TTE during admission in 01/2024 showed EF 35-40. He has undergone upgrade to CRT-P and is on GDMT (he is not on beta-blocker due to low output failure in 01/2024 and bradycardia). Plan is to repeat TTE soon to reassess LVF. He is currently NYHA class II. Volume status is stable. Recent CRT-P implantation has improved symptoms significantly. He is no longer on Spironolactone  for unclear reasons.  - Continue Farxiga  10 mg daily - Continue Lasix  20 mg daily - Continue Entresto  24/26 mg twice daily - Restart spironolactone  12.5 mg daily - Order follow-up BMET today - Consider repeat BMET in 1-2 weeks based upon results today. - Ordered limited echocardiogram to reassess LV function - Follow up with heart failure clinic as planned - Follow up with Dr. Santo in 6 mos.  Bicuspid aortic valve S/P AVR (aortic valve replacement) S/p Bentall procedure with aortic root replacement and bioprosthetic AVR in 04/2023. He had difficulty coming off bypass and due to concerns for cardiac ischemia, he underwent CABG w S-RCA and S-LAD. His post op course was c/b VF arrest, biventricular failure and required VA ECMO. He was transferred to [facility] for transplant but ultimately had cardiac recovery and was taken off ECMO. A  R and L cardiac catheterization in 01/2024 demonstrated no CAD in native coronary arteries and patent SVGs. TTE in 12/2023 showed normally functioning AVR.  - Continue SBE prophylaxis  Essential hypertension The patient's blood pressure is controlled on his current regimen.  Continue current therapy.   Heart block S/p Leadless PPM at the time of his Bentall. He underwent upgrade to CRT-P in 02/2024. - Follow up with EP as planned.  Herpes zoster without complication Herpes zoster rash in left T4-T5 dermatome. Rash has been present for > 72 hours. However, given his cardiac history, I think it is best to go ahead and treat him. - Start Valacyclovir  1000 mg three times a day for 7 days - I have advised him to follow up with primary care within the next week        Dispo:  Return in about 6 months (around 11/04/2024) for Routine Follow Up, w/ Dr. Santo.  Signed, Glendia Ferrier, PA-C

## 2024-05-07 ENCOUNTER — Ambulatory Visit: Payer: Self-pay | Admitting: Physician Assistant

## 2024-05-07 DIAGNOSIS — I5020 Unspecified systolic (congestive) heart failure: Secondary | ICD-10-CM

## 2024-05-07 LAB — BASIC METABOLIC PANEL WITH GFR
BUN/Creatinine Ratio: 17 (ref 10–24)
BUN: 17 mg/dL (ref 8–27)
CO2: 23 mmol/L (ref 20–29)
Calcium: 10.1 mg/dL (ref 8.6–10.2)
Chloride: 104 mmol/L (ref 96–106)
Creatinine, Ser: 1.02 mg/dL (ref 0.76–1.27)
Glucose: 99 mg/dL (ref 70–99)
Potassium: 4.7 mmol/L (ref 3.5–5.2)
Sodium: 141 mmol/L (ref 134–144)
eGFR: 84 mL/min/1.73 (ref >59)

## 2024-05-08 ENCOUNTER — Ambulatory Visit (INDEPENDENT_AMBULATORY_CARE_PROVIDER_SITE_OTHER)

## 2024-05-09 ENCOUNTER — Other Ambulatory Visit (HOSPITAL_COMMUNITY): Payer: Self-pay

## 2024-05-09 ENCOUNTER — Ambulatory Visit (INDEPENDENT_AMBULATORY_CARE_PROVIDER_SITE_OTHER)

## 2024-05-09 ENCOUNTER — Telehealth: Payer: Self-pay | Admitting: Pharmacist

## 2024-05-09 VITALS — BP 121/75 | HR 83 | Temp 98.0°F | Wt 188.0 lb

## 2024-05-09 DIAGNOSIS — R0982 Postnasal drip: Secondary | ICD-10-CM

## 2024-05-09 DIAGNOSIS — R0981 Nasal congestion: Secondary | ICD-10-CM

## 2024-05-09 DIAGNOSIS — J383 Other diseases of vocal cords: Secondary | ICD-10-CM

## 2024-05-09 DIAGNOSIS — K219 Gastro-esophageal reflux disease without esophagitis: Secondary | ICD-10-CM

## 2024-05-09 NOTE — Progress Notes (Signed)
   05/09/2024  Patient ID: Westley JULIANNA Polite, male   DOB: 06/28/1965, 58 y.o.   MRN: [id number]  Received referral from Dr. Leonel regarding med cost concerns related to Farxiga .  Patient would have drug deductible of $615 at beginning of the year and $45 monthly afterwards for Farxiga .   Notified of drug deductible at the beginning of the year that would need paid first.  If needed, could do: (to avoid paying drug deductible) - Farxiga  PAP - Entresto  is $45 from [facility] for 90DS ($180 total for the year) -  Wixela inhaler for about $50 with [facility] at [facility] ($600, if getting monthly for the year)  Called the pharmacy and confirmed correct insurance on file: - Farxiga  is $146 currently for 30DS + does not cover generic (tried while on the phone) - Entresto  is $31 monthly - Inhaler script needs sent  - [place] he has a $2000 drug deductible on the plan- said I've never heard of such a high deductible   Called to discuss further and said he is having no med cost concerns at this time. Will let us  know if anything changes.    Aloysius Lewis, PharmD, [facility] Phone Number: [phone]

## 2024-05-09 NOTE — Progress Notes (Signed)
 ENT Progress Note:  Discussed the use of AI scribe software for clinical note transcription with the patient, who gave verbal consent to proceed.  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 He states that he has been feeling much better in regards to his voice and throat complaints since his last appointment. He states that the feeling of something in his throat is essentially gone. He has no pain in his throat and his voice is back to baseline. He denies any new complaints.       Past Medical History:  Diagnosis Date   Aortic aneurysm    Arthritis    Left Knee   Cancer (HCC) 2022   Skin Cancer   GERD (gastroesophageal reflux disease)    Headache    Migraines   Heart murmur    Hyperlipidemia    Hypertension    Peripheral vascular disease    Thoracic Aortic Aneurysm   Vertigo     Past Surgical History:  Procedure Laterality Date   AORTIC VALVE REPLACEMENT N/A 04/26/2023   Procedure: AORTIC VALVE REPLACEMENT (AVR) USING 23 MM KONECT RESILIA AORTIC VALVED CONDUIT;  Surgeon: Maryjane Mt, MD;  Location: [facility] OR;  Service: Open Heart Surgery;  Laterality: N/A;   APPENDECTOMY     1990s   BIV PACEMAKER INSERTION CRT-P N/A 02/08/2024   Procedure: BIV PACEMAKER INSERTION CRT-P;  Surgeon: Nancey, Eulas BRAVO, MD;  Location: [facility] INVASIVE CV LAB;  Service: Cardiovascular;  Laterality: N/A;   CANNULATION FOR ECMO (EXTRACORPOREAL MEMBRANE OXYGENATION) N/A 04/26/2023   Procedure: CENTRAL CANNULATION FOR ECMO (EXTRACORPOREAL MEMBRANE OXYGENATION);  Surgeon: Maryjane Mt, MD;  Location: [facility] OR;  Service: Open Heart Surgery;  Laterality: N/A;   COLONOSCOPY WITH ESOPHAGOGASTRODUODENOSCOPY (EGD)     2020s   CORONARY ARTERY BYPASS GRAFT N/A 04/26/2023   Procedure: CORONARY ARTERY BYPASS GRAFTING (CABG) x 2 USING RIGHT AND LEFT GREATER SAPHENOUS VEIN HARVESTED OPEN;  Surgeon: Maryjane Mt, MD;  Location: [facility]  OR;  Service: Open Heart Surgery;  Laterality: N/A;   CORONARY ARTERY BYPASS GRAFT     EXPLORATION POST OPERATIVE OPEN HEART N/A 04/26/2023   Procedure: EXPLORATION POST OPERATIVE OPEN HEART;  Surgeon: Maryjane Mt, MD;  Location: [facility] OR;  Service: Open Heart Surgery;  Laterality: N/A;   EXPLORATION POST OPERATIVE OPEN HEART N/A 04/27/2023   Procedure: EXPLORATION POST OPERATIVE OPEN HEART;  Surgeon: Maryjane Mt, MD;  Location: [facility] OR;  Service: Open Heart Surgery;  Laterality: N/A;   EYE SURGERY Right    eye tissue removed   LEAD INSERTION N/A 02/08/2024   Procedure: LEAD INSERTION;  Surgeon: Nancey Eulas BRAVO, MD;  Location: [facility] INVASIVE CV LAB;  Service: Cardiovascular;  Laterality: N/A;   MOUTH SURGERY  2022   Skin Cancer Removal   PACEMAKER LEADLESS REMOVAL N/A 02/08/2024   Procedure: PACEMAKER LEADLESS REMOVAL;  Surgeon: Nancey Eulas BRAVO, MD;  Location: [facility] INVASIVE CV LAB;  Service: Cardiovascular;  Laterality: N/A;   REPLACEMENT ASCENDING AORTA N/A 04/26/2023   Procedure: REPLACEMENT ASCENDING AORTA USING 23 MM KONECT RESILIA AORTIC VALVED CONDUIT;  Surgeon: Maryjane Mt, MD;  Location: [facility] OR;  Service: Open Heart Surgery;  Laterality: N/A;   RIGHT HEART CATH N/A 01/10/2024   Procedure: RIGHT HEART CATH;  Surgeon: Zenaida Morene PARAS, MD;  Location: [facility] INVASIVE CV LAB;  Service: Cardiovascular;  Laterality: N/A;   RIGHT HEART CATH AND CORONARY/GRAFT ANGIOGRAPHY N/A 01/08/2024  Procedure: RIGHT HEART CATH AND CORONARY/GRAFT ANGIOGRAPHY;  Surgeon: Jordan, Peter M, MD;  Location: [facility] INVASIVE CV LAB;  Service: Cardiovascular;  Laterality: N/A;   RIGHT/LEFT HEART CATH AND CORONARY ANGIOGRAPHY N/A 12/28/2022   Procedure: RIGHT/LEFT HEART CATH AND CORONARY ANGIOGRAPHY;  Surgeon: Jordan, Peter M, MD;  Location: [facility] INVASIVE CV LAB;  Service: Cardiovascular;  Laterality: N/A;   STERIOD INJECTION  09/16/2011   Procedure: MINOR STEROID INJECTION;  Surgeon: Lacinda Ozell Mans, MD;  Location: [facility];  Service: Orthopedics;  Laterality: Left;  Left Lumbar Five-Sacral One Transforaminal Epidural Steroid Injection   STERNAL WOUND DEBRIDEMENT N/A 05/01/2023   Procedure: MEDIAL STERNAL WOUND DEBRIDEMENT;  Surgeon: Obadiah Coy, MD;  Location: [facility] OR;  Service: Thoracic;  Laterality: N/A;   TEE WITHOUT CARDIOVERSION N/A 11/05/2020   Procedure: TRANSESOPHAGEAL ECHOCARDIOGRAM (TEE);  Surgeon: Santo Stanly LABOR, MD;  Location: [facility] ENDOSCOPY;  Service: Cardiovascular;  Laterality: N/A;   TEE WITHOUT CARDIOVERSION N/A 04/26/2023   Procedure: TRANSESOPHAGEAL ECHOCARDIOGRAM;  Surgeon: Maryjane Mt, MD;  Location: [facility] OR;  Service: Open Heart Surgery;  Laterality: N/A;   TEE WITHOUT CARDIOVERSION N/A 04/27/2023   Procedure: TRANSESOPHAGEAL ECHOCARDIOGRAM (TEE);  Surgeon: Maryjane Mt, MD;  Location: [facility] OR;  Service: Open Heart Surgery;  Laterality: N/A;   TEE WITHOUT CARDIOVERSION N/A 05/01/2023   Procedure: TRANSESOPHAGEAL ECHOCARDIOGRAM (TEE);  Surgeon: Obadiah Coy, MD;  Location: [facility] OR;  Service: Thoracic;  Laterality: N/A;    Family History  Problem Relation Age of Onset   Hypertension Mother    Diabetes Father    Migraines Neg Hx     Social History:  reports that he has quit smoking. His smoking use included cigarettes. He has never used smokeless tobacco. He reports that he does not drink alcohol and does not use drugs.  Allergies:  Allergies  Allergen Reactions   Levofloxacin Other (See Comments)    unknown  unsure-cardiologist stated allergy    Medications: I have reviewed the patient's current medications.  The PMH, PSH, Medications, Allergies, and SH were reviewed and updated.  ROS: Constitutional: Negative for fever, weight loss and weight gain. Cardiovascular: Negative for chest pain and dyspnea on exertion. Respiratory: Is not experiencing shortness of breath at rest. Gastrointestinal: Negative for nausea and vomiting. Neurological: Negative  for headaches. Psychiatric: The patient is not nervous/anxious  Blood pressure 121/75, pulse 83, temperature 98 F (36.7 C), weight 188 lb (85.3 kg), SpO2 98%. Body mass index is 30.34 kg/m.  PHYSICAL EXAM:  Exam: General: Well-developed, well-nourished Communication and Voice: raspy Respiratory Respiratory effort: Equal inspiration and expiration without stridor Cardiovascular Peripheral Vascular: Warm extremities with equal color/perfusion Eyes: No nystagmus with equal extraocular motion bilaterally Neuro/Psych/Balance: Patient oriented to person, place, and time; Appropriate mood and affect; Gait is intact with no imbalance; Cranial nerves I-XII are intact Head and Face Inspection: Normocephalic and atraumatic without mass or lesion Palpation: Facial skeleton intact without bony stepoffs Salivary Glands: No mass or tenderness Facial Strength: Facial motility symmetric and full bilaterally ENT Pinna: External ear intact and fully developed External canal: Canal is patent with intact skin External Nose: No scar or anatomic deformity Internal Nose: Septum is deviated to the left. No polyp, or purulence. Mucosal edema and erythema present.  Bilateral inferior turbinate hypertrophy.  Lips, Teeth, and gums: Mucosa and teeth intact and viable TMJ: No pain to palpation with full mobility Oral cavity/oropharynx: No erythema or exudate, no lesions present Nasopharynx: No mass or lesion with intact mucosa Hypopharynx: Intact mucosa  without pooling of secretions Larynx Glottic: Full true vocal cord mobility with previously seen right vocal process granuloma resolved with a small area of scar at the superior aspect of the right true vocal fold Supraglottic: Normal appearing epiglottis and AE folds Interarytenoid Space: Mild pachydermia&edema Subglottic Space: Patent without lesion or edema Neck Neck and Trachea: Midline trachea without mass or lesion Thyroid: No mass or  nodularity Lymphatics: No lymphadenopathy  Procedure:  Preoperative diagnosis: vocal cord granuloma  Postoperative diagnosis:   same  Procedure: Flexible fiberoptic laryngoscopy with stroboscopy - CPT 31579   Surgeon: Adah Malkin, DO  Anesthesia: 4% Topical lidocaine  and Afrin  Complications: None  Condition is stable throughout exam  Indications and consent:   The patient presents in follow-up to the clinic with 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 All the risks, benefits, and potential complications were reviewed with the patient preoperatively and informed verbal consent was obtained.  Procedure: The patient was seated upright in the exam chair.   Topical lidocaine  and Afrin were applied to the nasal cavity. After adequate anesthesia had occurred, the flexible telescope with strobe capabilities was passed into the nasal cavity. The nasopharynx was patent without mass or lesion. The scope was passed behind the soft palate and directed toward the base of tongue. The base of tongue was visualized and was symmetric with no apparent masses or abnormal appearing tissue. There were no signs of a mass or pooling of secretions in the piriform sinuses. The supraglottic structures were normal.  The true vocal cords are mobile. The medial edges were free of lesions. The superior aspect of the posterior 1/3 of the right true vocal fold was noted to have a small area of scar likely from the previously seen granuloma. Closure was complete. Periodicity present. The mucosal wave and amplitude were normal. There is mild interarytenoid pachydermia and post cricoid edema. The mucosa appears healthy.   The laryngoscope was then slowly withdrawn and the patient tolerated the procedure well. There were no complications or blood loss.   Assessment & Plan Vocal process granuloma - resolved Granuloma resolved based on exam today. Improved swallowing and painless voice. -  Continue omeprazole  AM and Pepcid  PM for reflux. - Continue Reflux Gourmet postprandially.  Gastroesophageal Reflux Disease (GERD) - continue omeprazole  40 mg 30 minutes before breakfast - continue famotidine  40 mg at night - Recommend dietary changes to avoid reflux triggers - Advise against late meals and lying down immediately after eating - Recommend seaweed-based supplement reflux Gourmet  Chronic nasal congestion Postnasal Drip - Prescribe Zyrtec  10 mg daily for postnasal drainage - Prescribe Flonase  nasal spray 2 puffs bilateral nares twice daily - Recommend ocean spray for nasal moisture   Follow-up as needed  Adah Malkin, DO [facility]

## 2024-05-10 ENCOUNTER — Encounter

## 2024-05-16 ENCOUNTER — Ambulatory Visit: Attending: Cardiovascular Disease

## 2024-05-16 ENCOUNTER — Other Ambulatory Visit (HOSPITAL_COMMUNITY): Payer: Self-pay

## 2024-05-16 ENCOUNTER — Encounter: Payer: Self-pay | Admitting: Cardiovascular Disease

## 2024-05-16 ENCOUNTER — Ambulatory Visit: Attending: Internal Medicine | Admitting: Cardiovascular Disease

## 2024-05-16 VITALS — BP 119/76 | HR 86 | Resp 16 | Ht 66.0 in | Wt 188.8 lb

## 2024-05-16 DIAGNOSIS — I5020 Unspecified systolic (congestive) heart failure: Secondary | ICD-10-CM | POA: Diagnosis not present

## 2024-05-16 DIAGNOSIS — I10 Essential (primary) hypertension: Secondary | ICD-10-CM | POA: Diagnosis present

## 2024-05-16 DIAGNOSIS — I5022 Chronic systolic (congestive) heart failure: Secondary | ICD-10-CM

## 2024-05-16 DIAGNOSIS — Z950 Presence of cardiac pacemaker: Secondary | ICD-10-CM | POA: Diagnosis not present

## 2024-05-16 DIAGNOSIS — Z952 Presence of prosthetic heart valve: Secondary | ICD-10-CM | POA: Insufficient documentation

## 2024-05-16 NOTE — Patient Instructions (Signed)
 Medication Instructions:  Your physician recommends that you continue on your current medications as directed. Please refer to the Current Medication list given to you today.  *If you need a refill on your cardiac medications before your next appointment, please call your pharmacy*  Lab Work: None ordered.  If you have labs (blood work) drawn today and your tests are completely normal, you will receive your results only by: MyChart Message (if you have MyChart) OR A paper copy in the mail If you have any lab test that is abnormal or we need to change your treatment, we will call you to review the results.  Testing/Procedures: None ordered.   Follow-Up: At [facility], you and your health needs are our priority.  As part of our continuing mission to provide you with exceptional heart care, our providers are all part of one team.  This team includes your primary Cardiologist (physician) and Advanced Practice Providers or APPs (Physician Assistants and Nurse Practitioners) who all work together to provide you with the care you need, when you need it.  Your next appointment:   12 month(s)  Provider:   You will see one of the following Advanced Practice Providers on your designated Care Team:   Charlies Arthur, [place] Ozell Jodie Passey, PA-C Suzann Riddle, NP Daphne Barrack, NP Artist Pouch, PA-C    We recommend signing up for the patient portal called MyChart.  Sign up information is provided on this After Visit Summary.  MyChart is used to connect with patients for Virtual Visits (Telemedicine).  Patients are able to view lab/test results, encounter notes, upcoming appointments, etc.  Non-urgent messages can be sent to your provider as well.   To learn more about what you can do with MyChart, go to [email or web address].

## 2024-05-16 NOTE — Progress Notes (Signed)
 Electrophysiology Office Note:    Date:  05/16/2024   ID:  Johnathan Anderson, Johnathan Anderson [birth year], MRN [id number]  PCP:  Leonel Cole, MD   [facility] Providers Cardiologist:  Stanly DELENA Leavens, MD     Referring MD: Leonel Cole, MD   History of Present Illness:    Johnathan DELLAROCCO is a 58 y.o. male with a medical history significant for bicuspid aortic valve with Bentall procedure November 2024, referred for device management.     He an elective AVR with Bentall procedure in November 2024.  There is concern for coronary ischemia so he underwent saphenous vein graft to the RCA and to the LAD.  Postop there was significant bleeding and VT arrest with biventricular shock requiring VA ECMO.  His symptoms send at the [facility] for transplant evaluation.  A Micra leadless pacemaker was placed prior to discharge due to intermittent complete heart block  Discussed the use of AI scribe software for clinical note transcription with the patient, who gave verbal consent to proceed.  History of Present Illness Johnathan Anderson is a 58 year old male with a history of aortic valve disease and a BENTAL procedure who presents for management of his Micra leadless pacemaker. He is referred for management of his Micra leadless pacemaker.  He has a bicuspid aortic valve and underwent a BENTAL procedure in November 2020, during which he experienced complications including cardiogenic shock, bleeding, ventricular fibrillation requiring VA ECMO support.  He was transferred to [facility] for possible transplant but his cardiac function recovered.  A Micra leadless pacemaker was placed due to intermittent complete heart block prior to discharge.  He experiences occasional palpitations and irregular beats, though not at the time of the visit. He also feels fatigued, which led to a hospital visit on August 6th.  His paced QRS was greater than 160 ms.  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  He underwent placement of a Medtronic BiV pacemaker in September 2025.  He reports that he has been feeling great since the procedure.  A Micra was left in place.         Today, he reports that he is fatigued, constantly short of breath and has a constant cough.  EKGs/Labs/Other Studies Reviewed Today:     Echocardiogram:  TTE January 09, 2024 LVEF 35 to 40%.  Mild mitral valve regurgitation.  Aortic valve has been replaced.    EKG:         Physical Exam:    VS:  BP 119/76 (BP Location: Left Arm, Patient Position: Sitting, Cuff Size: Normal)   Pulse 86   Resp 16   Ht 5' 6 (1.676 m)   Wt 188 lb 12.8 oz (85.6 kg)   SpO2 99%   BMI 30.47 kg/m     Wt Readings from Last 3 Encounters:  05/16/24 188 lb 12.8 oz (85.6 kg)  05/09/24 188 lb (85.3 kg)  05/06/24 188 lb (85.3 kg)     GEN: Well nourished, well developed in no acute distress CARDIAC: RRR, no murmurs, rubs, gallops RESPIRATORY:  Normal work of breathing MUSCULOSKELETAL: no edema    ASSESSMENT & PLAN:     Complete heart block After complicated AVR and bypass resulting in cardiogenic shock Micra AV in place with failure to track the atrium, turned off I personally interrogated and reprogrammed the micra AV in an attempt to improve AV tracking Now  with [employer] BiV pacemaker --functioning normally He is device dependent today I reviewed today's device interrogation.  See Paceart for details.  Cardiomyopathy Status post emergent CABG during Bentall procedure Myocardial perfusion study July 2025 shows fixed defect EF July 1 was 45 to 50%, August 5 35 to 40% I suspect RV pacing and AV synchrony is contributing to decline in EF I think upgrade to a transvenous system with CRT is appropriate I discussed the procedure with the patient and his wife -- placement of a transvenous system would increase infection risk, but I think CHF is a more pressing  problem    Bicuspid aortic valve Status post Bentall procedure   Signed, Eulas FORBES Furbish, MD  05/16/2024 2:12 PM    [facility]

## 2024-05-20 LAB — CUP PACEART REMOTE DEVICE CHECK
Battery Remaining Longevity: 130 mo
Battery Voltage: 3.16 V
Brady Statistic AP VP Percent: 50.59 %
Brady Statistic AP VS Percent: 0.01 %
Brady Statistic AS VP Percent: 49.36 %
Brady Statistic AS VS Percent: 0.05 %
Brady Statistic RA Percent Paced: 50.56 %
Brady Statistic RV Percent Paced: 99.94 %
Date Time Interrogation Session: 20251211150911
Implantable Lead Connection Status: 753985
Implantable Lead Connection Status: 753985
Implantable Lead Connection Status: 753985
Implantable Lead Implant Date: 20250904
Implantable Lead Implant Date: 20250904
Implantable Lead Implant Date: 20250904
Implantable Lead Location: 753858
Implantable Lead Location: 753859
Implantable Lead Location: 753860
Implantable Lead Model: 3830
Implantable Lead Model: 4398
Implantable Lead Model: 5076
Implantable Pulse Generator Implant Date: 20250904
Lead Channel Impedance Value: 323 Ohm
Lead Channel Impedance Value: 342 Ohm
Lead Channel Impedance Value: 361 Ohm
Lead Channel Impedance Value: 380 Ohm
Lead Channel Impedance Value: 399 Ohm
Lead Channel Impedance Value: 418 Ohm
Lead Channel Impedance Value: 437 Ohm
Lead Channel Impedance Value: 513 Ohm
Lead Channel Impedance Value: 627 Ohm
Lead Channel Impedance Value: 646 Ohm
Lead Channel Impedance Value: 665 Ohm
Lead Channel Impedance Value: 665 Ohm
Lead Channel Impedance Value: 703 Ohm
Lead Channel Impedance Value: 741 Ohm
Lead Channel Pacing Threshold Amplitude: 0.75 V
Lead Channel Pacing Threshold Amplitude: 0.875 V
Lead Channel Pacing Threshold Amplitude: 0.875 V
Lead Channel Pacing Threshold Pulse Width: 0.4 ms
Lead Channel Pacing Threshold Pulse Width: 0.4 ms
Lead Channel Pacing Threshold Pulse Width: 0.4 ms
Lead Channel Sensing Intrinsic Amplitude: 16.375 mV
Lead Channel Sensing Intrinsic Amplitude: 2.25 mV
Lead Channel Sensing Intrinsic Amplitude: 2.625 mV
Lead Channel Sensing Intrinsic Amplitude: 9.25 mV
Lead Channel Setting Pacing Amplitude: 1.25 V
Lead Channel Setting Pacing Amplitude: 1.75 V
Lead Channel Setting Pacing Amplitude: 2.5 V
Lead Channel Setting Pacing Pulse Width: 0.4 ms
Lead Channel Setting Pacing Pulse Width: 0.4 ms
Lead Channel Setting Sensing Sensitivity: 0.9 mV
Zone Setting Status: 755011
Zone Setting Status: 755011

## 2024-05-23 NOTE — Progress Notes (Signed)
 Remote PPM Transmission

## 2024-05-29 LAB — CUP PACEART INCLINIC DEVICE CHECK
Battery Remaining Longevity: 130 mo
Battery Voltage: 3.16 V
Brady Statistic AP VP Percent: 50.59 %
Brady Statistic AP VS Percent: 0.01 %
Brady Statistic AS VP Percent: 49.36 %
Brady Statistic AS VS Percent: 0.05 %
Brady Statistic RA Percent Paced: 50.56 %
Brady Statistic RV Percent Paced: 99.94 %
Date Time Interrogation Session: 20251211145200
Implantable Lead Connection Status: 753985
Implantable Lead Connection Status: 753985
Implantable Lead Connection Status: 753985
Implantable Lead Implant Date: 20250904
Implantable Lead Implant Date: 20250904
Implantable Lead Implant Date: 20250904
Implantable Lead Location: 753858
Implantable Lead Location: 753859
Implantable Lead Location: 753860
Implantable Lead Model: 3830
Implantable Lead Model: 4398
Implantable Lead Model: 5076
Implantable Pulse Generator Implant Date: 20250904
Lead Channel Impedance Value: 323 Ohm
Lead Channel Impedance Value: 342 Ohm
Lead Channel Impedance Value: 361 Ohm
Lead Channel Impedance Value: 380 Ohm
Lead Channel Impedance Value: 399 Ohm
Lead Channel Impedance Value: 418 Ohm
Lead Channel Impedance Value: 437 Ohm
Lead Channel Impedance Value: 513 Ohm
Lead Channel Impedance Value: 627 Ohm
Lead Channel Impedance Value: 646 Ohm
Lead Channel Impedance Value: 665 Ohm
Lead Channel Impedance Value: 665 Ohm
Lead Channel Impedance Value: 703 Ohm
Lead Channel Impedance Value: 741 Ohm
Lead Channel Pacing Threshold Amplitude: 0.75 V
Lead Channel Pacing Threshold Amplitude: 1 V
Lead Channel Pacing Threshold Amplitude: 1.25 V
Lead Channel Pacing Threshold Pulse Width: 0.4 ms
Lead Channel Pacing Threshold Pulse Width: 0.4 ms
Lead Channel Pacing Threshold Pulse Width: 0.4 ms
Lead Channel Sensing Intrinsic Amplitude: 2.25 mV
Lead Channel Setting Pacing Amplitude: 1.25 V
Lead Channel Setting Pacing Amplitude: 1.75 V
Lead Channel Setting Pacing Amplitude: 2.5 V
Lead Channel Setting Pacing Pulse Width: 0.4 ms
Lead Channel Setting Pacing Pulse Width: 0.4 ms
Lead Channel Setting Sensing Sensitivity: 0.9 mV
Zone Setting Status: 755011
Zone Setting Status: 755011

## 2024-06-03 ENCOUNTER — Ambulatory Visit: Payer: Self-pay | Admitting: Cardiovascular Disease

## 2024-06-18 ENCOUNTER — Ambulatory Visit (HOSPITAL_COMMUNITY)
Admission: RE | Admit: 2024-06-18 | Discharge: 2024-06-18 | Disposition: A | Discharge status: Home / Self Care | Source: Ambulatory Visit | Attending: Cardiology | Admitting: Cardiology

## 2024-06-18 DIAGNOSIS — I5020 Unspecified systolic (congestive) heart failure: Secondary | ICD-10-CM | POA: Insufficient documentation

## 2024-06-18 DIAGNOSIS — Z952 Presence of prosthetic heart valve: Secondary | ICD-10-CM | POA: Diagnosis present

## 2024-06-18 LAB — ECHOCARDIOGRAM LIMITED
AV Mean grad: 12.3 mmHg
AV Peak grad: 22.1 mmHg
Ao pk vel: 2.35 m/s
Area-P 1/2: 5.54 cm2
S' Lateral: 3.9 cm

## 2024-06-18 MED ORDER — PERFLUTREN LIPID MICROSPHERE
1.0000 mL | INTRAVENOUS | Status: AC | PRN
  Administered 2024-06-18: 2 mL via INTRAVENOUS

## 2024-06-28 ENCOUNTER — Encounter: Payer: Self-pay | Admitting: Internal Medicine

## 2024-07-01 ENCOUNTER — Other Ambulatory Visit (HOSPITAL_COMMUNITY): Payer: Self-pay

## 2024-07-01 ENCOUNTER — Telehealth: Payer: Self-pay | Admitting: Pharmacist

## 2024-07-01 NOTE — Telephone Encounter (Signed)
 Pt reporting Farxiga  is 800. Can we look at this- might be a deductible issue, but can we make sure it doesn't need a PA or if Bernadine is less expensive?

## 2024-07-04 ENCOUNTER — Other Ambulatory Visit (HOSPITAL_COMMUNITY): Payer: Self-pay

## 2024-07-04 ENCOUNTER — Telehealth: Payer: Self-pay | Admitting: Pharmacy Technician

## 2024-07-04 NOTE — Telephone Encounter (Signed)
 Patient Advocate Encounter   The patient was approved for a [employer] grant that will help cover the cost of Farxiga / entresto  Total amount awarded, 7500.00.  Effective: 06/04/24 - 06/03/25   APW:[id number] ERW:[id number] Hmnle:[id number] PI:[id number]   [employer] ID: [id number]   Pharmacy provided with approval and processing information. Patient informed via mychart

## 2024-07-04 NOTE — Telephone Encounter (Signed)
 He scanned in his card to me. 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 Can we try to get him a grant?

## 2024-07-05 ENCOUNTER — Encounter: Payer: Self-pay | Admitting: Pharmacist

## 2024-08-05 ENCOUNTER — Inpatient Hospital Stay: Payer: Medicare Other | Admitting: Adult Health

## 2024-08-09 ENCOUNTER — Encounter

## 2024-08-15 ENCOUNTER — Ambulatory Visit

## 2024-11-08 ENCOUNTER — Encounter
